# Patient Record
Sex: Male | Born: 1940
Health system: Southern US, Community
[De-identification: ages and names within clinical notes are randomized; demographics above are authoritative.]

## PROBLEM LIST (undated history)

## (undated) DIAGNOSIS — I517 Cardiomegaly: Secondary | ICD-10-CM

## (undated) DIAGNOSIS — M1990 Unspecified osteoarthritis, unspecified site: Secondary | ICD-10-CM

## (undated) DIAGNOSIS — R600 Localized edema: Secondary | ICD-10-CM

## (undated) DIAGNOSIS — E039 Hypothyroidism, unspecified: Secondary | ICD-10-CM

## (undated) DIAGNOSIS — I428 Other cardiomyopathies: Secondary | ICD-10-CM

## (undated) DIAGNOSIS — R238 Other skin changes: Secondary | ICD-10-CM

## (undated) DIAGNOSIS — Z8719 Personal history of other diseases of the digestive system: Secondary | ICD-10-CM

## (undated) DIAGNOSIS — S83242A Other tear of medial meniscus, current injury, left knee, initial encounter: Secondary | ICD-10-CM

## (undated) DIAGNOSIS — I499 Cardiac arrhythmia, unspecified: Secondary | ICD-10-CM

## (undated) DIAGNOSIS — R609 Edema, unspecified: Secondary | ICD-10-CM

## (undated) DIAGNOSIS — E7800 Pure hypercholesterolemia, unspecified: Secondary | ICD-10-CM

## (undated) DIAGNOSIS — N183 Chronic kidney disease, stage 3 unspecified: Secondary | ICD-10-CM

## (undated) DIAGNOSIS — K219 Gastro-esophageal reflux disease without esophagitis: Secondary | ICD-10-CM

## (undated) DIAGNOSIS — R233 Spontaneous ecchymoses: Secondary | ICD-10-CM

## (undated) DIAGNOSIS — R251 Tremor, unspecified: Secondary | ICD-10-CM

## (undated) DIAGNOSIS — I10 Essential (primary) hypertension: Secondary | ICD-10-CM

## (undated) DIAGNOSIS — I340 Nonrheumatic mitral (valve) insufficiency: Secondary | ICD-10-CM

## (undated) DIAGNOSIS — R739 Hyperglycemia, unspecified: Secondary | ICD-10-CM

## (undated) DIAGNOSIS — I509 Heart failure, unspecified: Secondary | ICD-10-CM

## (undated) DIAGNOSIS — I2510 Atherosclerotic heart disease of native coronary artery without angina pectoris: Secondary | ICD-10-CM

## (undated) DIAGNOSIS — I480 Paroxysmal atrial fibrillation: Secondary | ICD-10-CM

## (undated) DIAGNOSIS — G4730 Sleep apnea, unspecified: Secondary | ICD-10-CM

## (undated) HISTORY — PX: OTHER SURGICAL HISTORY: SHX169

## (undated) HISTORY — DX: Cardiomegaly: I51.7

## (undated) HISTORY — DX: Heart failure, unspecified: I50.9

## (undated) HISTORY — DX: Edema, unspecified: R60.9

## (undated) HISTORY — DX: Tremor, unspecified: R25.1

## (undated) HISTORY — DX: Paroxysmal atrial fibrillation: I48.0

## (undated) HISTORY — PX: LITHOTRIPSY: SUR834

## (undated) HISTORY — DX: Hyperglycemia, unspecified: R73.9

## (undated) HISTORY — PX: ATRIAL FIBRILLATION ABLATION: SHX5732

## (undated) HISTORY — DX: Chronic kidney disease, stage 3 unspecified: N18.30

## (undated) HISTORY — DX: Pure hypercholesterolemia, unspecified: E78.00

## (undated) HISTORY — PX: CATARACT EXTRACTION, BILATERAL: SHX1313

## (undated) HISTORY — DX: Chronic kidney disease, stage 3 (moderate): N18.3

## (undated) HISTORY — PX: EYE SURGERY: SHX253

## (undated) HISTORY — PX: CARDIAC CATHETERIZATION: SHX172

## (undated) HISTORY — DX: Nonrheumatic mitral (valve) insufficiency: I34.0

## (undated) HISTORY — DX: Localized edema: R60.0

## (undated) HISTORY — DX: Other cardiomyopathies: I42.8

## (undated) HISTORY — DX: Hypothyroidism, unspecified: E03.9

## (undated) HISTORY — DX: Essential (primary) hypertension: I10

## (undated) HISTORY — PX: KIDNEY STONE SURGERY: SHX686

---

## 1997-06-26 HISTORY — PX: OTHER SURGICAL HISTORY: SHX169

## 1998-04-09 ENCOUNTER — Observation Stay (HOSPITAL_COMMUNITY): Admission: RE | Admit: 1998-04-09 | Discharge: 1998-04-10 | Payer: Self-pay | Admitting: Orthopedic Surgery

## 1998-04-12 ENCOUNTER — Encounter (HOSPITAL_COMMUNITY): Admission: RE | Admit: 1998-04-12 | Discharge: 1998-07-11 | Payer: Self-pay | Admitting: Orthopedic Surgery

## 2003-06-27 HISTORY — PX: JOINT REPLACEMENT: SHX530

## 2003-07-14 ENCOUNTER — Inpatient Hospital Stay (HOSPITAL_COMMUNITY): Admission: RE | Admit: 2003-07-14 | Discharge: 2003-07-18 | Payer: Self-pay | Admitting: Orthopedic Surgery

## 2003-08-06 ENCOUNTER — Encounter: Admission: RE | Admit: 2003-08-06 | Discharge: 2003-08-06 | Payer: Self-pay | Admitting: Orthopedic Surgery

## 2005-03-14 ENCOUNTER — Ambulatory Visit (HOSPITAL_COMMUNITY): Admission: RE | Admit: 2005-03-14 | Discharge: 2005-03-14 | Payer: Self-pay | Admitting: Urology

## 2005-03-20 ENCOUNTER — Ambulatory Visit (HOSPITAL_COMMUNITY): Admission: RE | Admit: 2005-03-20 | Discharge: 2005-03-20 | Payer: Self-pay | Admitting: Urology

## 2005-03-26 HISTORY — PX: HERNIA REPAIR: SHX51

## 2005-04-18 ENCOUNTER — Encounter (INDEPENDENT_AMBULATORY_CARE_PROVIDER_SITE_OTHER): Payer: Self-pay | Admitting: *Deleted

## 2005-04-18 ENCOUNTER — Ambulatory Visit (HOSPITAL_COMMUNITY): Admission: RE | Admit: 2005-04-18 | Discharge: 2005-04-19 | Payer: Self-pay | Admitting: Surgery

## 2005-06-16 ENCOUNTER — Ambulatory Visit (HOSPITAL_COMMUNITY): Admission: RE | Admit: 2005-06-16 | Discharge: 2005-06-16 | Payer: Self-pay | Admitting: Surgery

## 2005-06-26 HISTORY — PX: OTHER SURGICAL HISTORY: SHX169

## 2006-06-13 ENCOUNTER — Encounter: Admission: RE | Admit: 2006-06-13 | Discharge: 2006-06-13 | Payer: Self-pay | Admitting: Orthopedic Surgery

## 2006-06-15 ENCOUNTER — Ambulatory Visit: Payer: Self-pay | Admitting: Infectious Diseases

## 2006-06-15 ENCOUNTER — Ambulatory Visit (HOSPITAL_COMMUNITY): Admission: RE | Admit: 2006-06-15 | Discharge: 2006-06-17 | Payer: Self-pay | Admitting: Orthopedic Surgery

## 2006-07-18 ENCOUNTER — Ambulatory Visit: Payer: Self-pay | Admitting: Infectious Diseases

## 2006-07-18 LAB — CONVERTED CEMR LAB
CRP: 0.2 mg/dL (ref <0.6)
HCT: 41.6 % (ref 39.0–52.0)
Hemoglobin: 13.5 g/dL (ref 13.0–17.0)
MCHC: 32.5 g/dL (ref 30.0–36.0)
MCV: 90 fL (ref 78.0–100.0)
Platelets: 176 10*3/uL (ref 150–400)
RBC: 4.62 M/uL (ref 4.22–5.81)
RDW: 13.6 % (ref 11.5–14.0)
Sed Rate: 12 mm/hr (ref 0–16)
WBC: 7.4 10*3/uL (ref 4.0–10.5)

## 2006-08-16 ENCOUNTER — Encounter: Payer: Self-pay | Admitting: Infectious Diseases

## 2006-10-11 ENCOUNTER — Encounter: Payer: Self-pay | Admitting: Infectious Diseases

## 2006-11-07 ENCOUNTER — Ambulatory Visit: Payer: Self-pay | Admitting: Infectious Diseases

## 2006-11-07 DIAGNOSIS — IMO0002 Reserved for concepts with insufficient information to code with codable children: Secondary | ICD-10-CM | POA: Insufficient documentation

## 2006-11-07 DIAGNOSIS — M1990 Unspecified osteoarthritis, unspecified site: Secondary | ICD-10-CM | POA: Insufficient documentation

## 2006-11-07 DIAGNOSIS — I10 Essential (primary) hypertension: Secondary | ICD-10-CM | POA: Insufficient documentation

## 2006-11-07 DIAGNOSIS — Z96649 Presence of unspecified artificial hip joint: Secondary | ICD-10-CM | POA: Insufficient documentation

## 2006-11-07 DIAGNOSIS — K219 Gastro-esophageal reflux disease without esophagitis: Secondary | ICD-10-CM | POA: Insufficient documentation

## 2006-11-07 DIAGNOSIS — K439 Ventral hernia without obstruction or gangrene: Secondary | ICD-10-CM | POA: Insufficient documentation

## 2006-11-07 DIAGNOSIS — Z87442 Personal history of urinary calculi: Secondary | ICD-10-CM | POA: Insufficient documentation

## 2007-04-29 ENCOUNTER — Encounter: Payer: Self-pay | Admitting: Infectious Diseases

## 2010-04-25 ENCOUNTER — Ambulatory Visit: Payer: Self-pay | Admitting: Cardiology

## 2010-08-01 ENCOUNTER — Ambulatory Visit
Admission: RE | Admit: 2010-08-01 | Discharge: 2010-08-01 | Disposition: A | Discharge status: Home / Self Care | Payer: Medicare Other | Source: Ambulatory Visit | Attending: Cardiology | Admitting: Cardiology

## 2010-08-01 ENCOUNTER — Other Ambulatory Visit: Payer: Self-pay | Admitting: Cardiology

## 2010-08-01 ENCOUNTER — Ambulatory Visit (INDEPENDENT_AMBULATORY_CARE_PROVIDER_SITE_OTHER): Payer: Medicare Other | Admitting: Cardiology

## 2010-08-01 DIAGNOSIS — I4891 Unspecified atrial fibrillation: Secondary | ICD-10-CM

## 2010-08-01 DIAGNOSIS — R609 Edema, unspecified: Secondary | ICD-10-CM

## 2010-08-09 ENCOUNTER — Ambulatory Visit (INDEPENDENT_AMBULATORY_CARE_PROVIDER_SITE_OTHER): Payer: Medicare Other | Admitting: Cardiology

## 2010-08-09 DIAGNOSIS — I4891 Unspecified atrial fibrillation: Secondary | ICD-10-CM

## 2010-08-09 DIAGNOSIS — R0602 Shortness of breath: Secondary | ICD-10-CM

## 2010-08-15 ENCOUNTER — Ambulatory Visit (HOSPITAL_COMMUNITY): Payer: Medicare Other | Attending: Cardiology

## 2010-08-15 ENCOUNTER — Encounter: Payer: Self-pay | Admitting: Infectious Diseases

## 2010-08-15 ENCOUNTER — Encounter: Payer: Self-pay | Admitting: Cardiology

## 2010-08-15 DIAGNOSIS — I998 Other disorder of circulatory system: Secondary | ICD-10-CM | POA: Insufficient documentation

## 2010-08-15 DIAGNOSIS — I4891 Unspecified atrial fibrillation: Secondary | ICD-10-CM

## 2010-08-15 DIAGNOSIS — I059 Rheumatic mitral valve disease, unspecified: Secondary | ICD-10-CM | POA: Insufficient documentation

## 2010-08-17 ENCOUNTER — Ambulatory Visit (INDEPENDENT_AMBULATORY_CARE_PROVIDER_SITE_OTHER): Payer: Medicare Other | Admitting: Cardiology

## 2010-08-17 DIAGNOSIS — M7989 Other specified soft tissue disorders: Secondary | ICD-10-CM

## 2010-08-17 DIAGNOSIS — I4891 Unspecified atrial fibrillation: Secondary | ICD-10-CM

## 2010-08-23 ENCOUNTER — Ambulatory Visit: Payer: Medicare Other | Admitting: Cardiology

## 2010-08-26 ENCOUNTER — Ambulatory Visit (INDEPENDENT_AMBULATORY_CARE_PROVIDER_SITE_OTHER): Payer: Medicare Other | Admitting: Cardiology

## 2010-08-26 DIAGNOSIS — I4891 Unspecified atrial fibrillation: Secondary | ICD-10-CM

## 2010-08-26 DIAGNOSIS — Z79899 Other long term (current) drug therapy: Secondary | ICD-10-CM

## 2010-08-26 DIAGNOSIS — R609 Edema, unspecified: Secondary | ICD-10-CM

## 2010-08-30 ENCOUNTER — Ambulatory Visit (HOSPITAL_COMMUNITY)
Admission: RE | Admit: 2010-08-30 | Discharge: 2010-08-30 | Disposition: A | Discharge status: Home / Self Care | Payer: Medicare Other | Source: Ambulatory Visit | Attending: Cardiology | Admitting: Cardiology

## 2010-08-30 DIAGNOSIS — I509 Heart failure, unspecified: Secondary | ICD-10-CM | POA: Insufficient documentation

## 2010-08-30 DIAGNOSIS — E785 Hyperlipidemia, unspecified: Secondary | ICD-10-CM | POA: Insufficient documentation

## 2010-08-30 DIAGNOSIS — I4891 Unspecified atrial fibrillation: Secondary | ICD-10-CM | POA: Insufficient documentation

## 2010-08-30 DIAGNOSIS — I10 Essential (primary) hypertension: Secondary | ICD-10-CM | POA: Insufficient documentation

## 2010-08-30 LAB — PROTIME-INR: Prothrombin Time: 25 seconds — ABNORMAL HIGH (ref 11.6–15.2)

## 2010-09-02 NOTE — Op Note (Signed)
  NAME:  Charles [name], Charles [name] NO.:  [id number]  MEDICAL RECORD NO.:  [id number]           PATIENT TYPE:  O  LOCATION:  [facility]                         FACILITY:  [facility]  PHYSICIAN:  [name], M.D. DATE OF BIRTH:  December 29, 1940  DATE OF PROCEDURE:  08/30/2010 DATE OF DISCHARGE:  08/30/2010                              OPERATIVE REPORT   PROCEDURE:  Direct current cardioversion.  This 70 year old gentleman is brought into [facility] short- stay for elective cardioversion.  He has been anticoagulated with Pradaxa for more than 3 weeks.  After being given 100 mg of propofol IV by Dr. Jacklynn Bue, the patient was given a single 120 joules shock using the AP pads.  He converted promptly to normal sinus rhythm with sinus bradycardia.  He tolerated the procedure well.  There were no immediate postanesthetic complications.  IMPRESSION:  Successful direct current cardioversion of atrial fibrillation.      ______________________________ [name], M.D.     TB/MEDQ  D:  08/30/2010  T:  08/31/2010  Job:  [id number]  Electronically Signed by [name] M.D. on 09/01/2010 04:18:57 PM

## 2010-09-06 ENCOUNTER — Ambulatory Visit (INDEPENDENT_AMBULATORY_CARE_PROVIDER_SITE_OTHER): Payer: Medicare Other | Admitting: Cardiology

## 2010-09-06 DIAGNOSIS — I4891 Unspecified atrial fibrillation: Secondary | ICD-10-CM

## 2010-09-06 DIAGNOSIS — R0602 Shortness of breath: Secondary | ICD-10-CM

## 2010-10-05 ENCOUNTER — Other Ambulatory Visit: Payer: Self-pay | Admitting: *Deleted

## 2010-10-05 DIAGNOSIS — E7800 Pure hypercholesterolemia, unspecified: Secondary | ICD-10-CM

## 2010-10-06 ENCOUNTER — Encounter: Payer: Self-pay | Admitting: Cardiology

## 2010-10-06 DIAGNOSIS — I517 Cardiomegaly: Secondary | ICD-10-CM | POA: Insufficient documentation

## 2010-10-06 DIAGNOSIS — R0600 Dyspnea, unspecified: Secondary | ICD-10-CM | POA: Insufficient documentation

## 2010-10-06 DIAGNOSIS — N189 Chronic kidney disease, unspecified: Secondary | ICD-10-CM | POA: Insufficient documentation

## 2010-10-06 DIAGNOSIS — I509 Heart failure, unspecified: Secondary | ICD-10-CM | POA: Insufficient documentation

## 2010-10-06 DIAGNOSIS — E7800 Pure hypercholesterolemia, unspecified: Secondary | ICD-10-CM | POA: Insufficient documentation

## 2010-10-06 DIAGNOSIS — R609 Edema, unspecified: Secondary | ICD-10-CM | POA: Insufficient documentation

## 2010-10-06 DIAGNOSIS — R0601 Orthopnea: Secondary | ICD-10-CM | POA: Insufficient documentation

## 2010-10-06 DIAGNOSIS — I340 Nonrheumatic mitral (valve) insufficiency: Secondary | ICD-10-CM | POA: Insufficient documentation

## 2010-10-06 DIAGNOSIS — I4891 Unspecified atrial fibrillation: Secondary | ICD-10-CM | POA: Insufficient documentation

## 2010-10-06 DIAGNOSIS — I10 Essential (primary) hypertension: Secondary | ICD-10-CM | POA: Insufficient documentation

## 2010-10-06 DIAGNOSIS — R600 Localized edema: Secondary | ICD-10-CM | POA: Insufficient documentation

## 2010-10-07 ENCOUNTER — Other Ambulatory Visit (INDEPENDENT_AMBULATORY_CARE_PROVIDER_SITE_OTHER): Payer: Medicare Other | Admitting: *Deleted

## 2010-10-07 ENCOUNTER — Encounter: Payer: Self-pay | Admitting: Cardiology

## 2010-10-07 ENCOUNTER — Ambulatory Visit (INDEPENDENT_AMBULATORY_CARE_PROVIDER_SITE_OTHER): Payer: Medicare Other | Admitting: Cardiology

## 2010-10-07 VITALS — BP 122/70 | HR 56 | Ht 71.0 in | Wt 193.0 lb

## 2010-10-07 DIAGNOSIS — Z79899 Other long term (current) drug therapy: Secondary | ICD-10-CM

## 2010-10-07 DIAGNOSIS — E7800 Pure hypercholesterolemia, unspecified: Secondary | ICD-10-CM

## 2010-10-07 DIAGNOSIS — I4891 Unspecified atrial fibrillation: Secondary | ICD-10-CM

## 2010-10-07 LAB — CBC WITH DIFFERENTIAL/PLATELET
Basophils Absolute: 0 10*3/uL (ref 0.0–0.1)
Eosinophils Relative: 4.3 % (ref 0.0–5.0)
HCT: 42.8 % (ref 39.0–52.0)
Hemoglobin: 14.7 g/dL (ref 13.0–17.0)
Lymphs Abs: 1.7 10*3/uL (ref 0.7–4.0)
MCV: 92.4 fl (ref 78.0–100.0)
Monocytes Relative: 10.4 % (ref 3.0–12.0)
Neutro Abs: 4 10*3/uL (ref 1.4–7.7)
RDW: 14.6 % (ref 11.5–14.6)

## 2010-10-07 LAB — HEPATIC FUNCTION PANEL
Albumin: 4.2 g/dL (ref 3.5–5.2)
Total Protein: 7.2 g/dL (ref 6.0–8.3)

## 2010-10-07 LAB — BASIC METABOLIC PANEL
CO2: 26 mEq/L (ref 19–32)
Calcium: 9.4 mg/dL (ref 8.4–10.5)
Creatinine, Ser: 1.2 mg/dL (ref 0.4–1.5)
Glucose, Bld: 95 mg/dL (ref 70–99)

## 2010-10-07 MED ORDER — AMIODARONE HCL 200 MG PO TABS: 200.0000 mg | ORAL_TABLET | Freq: Every day | ORAL | Status: DC

## 2010-10-07 MED ORDER — ASPIRIN 325 MG PO TABS: 325.0000 mg | ORAL_TABLET | Freq: Every day | ORAL | Status: AC

## 2010-10-07 NOTE — Assessment & Plan Note (Signed)
The patient is seen for one-month followup office visit.  He has had prior successful cardioversion from atrial fibrillation on 08/30/10.  He has continued on Pradaxa and has remained in normal sinus rhythm he has also been onAmiodarone 200 mg twice a day.  He is not having any symptoms of congestive heart failure now and he denies dyspnea orthopnea or peripheral edema.  He feels like he is back to normal.

## 2010-10-07 NOTE — Progress Notes (Signed)
Doretha Imus Date of Birth:  January 31, 1941 [facility] Cardiology / [facility] [address]   [address] [place], [place]  [address] [phone]           Fax   [phone] Doretha Imus Date of Birth:  01-Jun-1941 [facility] Cardiology / [facility] [address]   [address] [place], [place]  [address] [phone]           Fax   [phone]  History of Present Illness: This is a 70 year old gentleman who returns for a followup visit.  He had a remote history of paroxysmal atrial fibrillation.  In early February he returned from [place] with a history that he had been gaining weight for several weeks and had been short of breath.  While in [place] he had gained about 16 pounds.  We treated him initially with diltiazem and furosemide and Pradaxa and subsequently amiodarone and Zaroxolyn and the patient improved to the point that we were able to cardiovert him on August 30, 2010.  He has not had an ischemic workup.  He had an echocardiogram on 08/15/10 which showed ejection fraction of a mild to moderately depressed with normal cavity size and normal wall thickness and mild mitral regurgitation.  Current Outpatient Prescriptions  Medication Sig Dispense Refill  . amiodarone (PACERONE) 200 MG tablet Take 1 tablet (200 mg total) by mouth daily.  30 tablet  12  . Ascorbic Acid (VITAMIN C) 500 MG tablet Take 1,500 mg by mouth daily.        . Cholecalciferol (VITAMIN D) 2000 UNITS CAPS Take 4,000 Units by mouth daily.        . Coenzyme Q10 (COQ10 PO) Take 100 mg by mouth daily.       . enalapril (VASOTEC) 20 MG tablet Take 20 mg by mouth daily.        . furosemide (LASIX) 40 MG tablet Take 20 mg by mouth daily. As directed      . magnesium oxide (MAG-OX) 400 MG tablet Take 400 mg by mouth daily.        . metoprolol (TOPROL-XL) 100 MG 24 hr tablet Take 50 mg by mouth 2 (two) times daily.        . Misc Natural Products (OSTEO BI-FLEX JOINT SHIELD PO) Take by mouth daily.        .  Multiple Vitamin (MULTIVITAMIN) tablet Take 1 tablet by mouth daily.        . Omega-3 Fatty Acids (FISH OIL) 1000 MG CAPS Take by mouth 2 (two) times daily.        Marland Kitchen omeprazole (PRILOSEC) 20 MG capsule Take 20 mg by mouth daily.        Marland Kitchen zinc gluconate 50 MG tablet Take 50 mg by mouth daily.        Marland Kitchen DISCONTD: amiodarone (PACERONE) 200 MG tablet Take 200 mg by mouth 2 (two) times daily.        Marland Kitchen DISCONTD: aspirin 81 MG tablet Take 81 mg by mouth daily.        Marland Kitchen DISCONTD: dabigatran (PRADAXA) 150 MG CAPS Take 150 mg by mouth every 12 (twelve) hours.        Marland Kitchen DISCONTD: Potassium Chloride Crys CR (KLOR-CON M20 PO) Take by mouth. 1/2 tablet two times daily      . aspirin (BAYER ASPIRIN) 325 MG tablet Take 1 tablet (325 mg total) by mouth daily.  100 tablet  3    Allergies  Allergen Reactions  .  Ceftriaxone Sodium     Rocephin  . Diltiazem Hcl     Cardizem  . Penicillins     Patient Active Problem List  Diagnoses  . HYPERTENSION  . GERD  . ABDOMINAL WALL HERNIA  . OSTEOARTHROSIS NOS, UNSPECIFIED SITE  . ABRASION, LOWER EXTREMITY W/INFECTION  . NEPHROLITHIASIS, HX OF  . HIP REPLACEMENT, TOTAL, HX OF  . Atrial fibrillation  . Peripheral edema  . Swelling  . Mitral regurgitation  . Atrial dilatation  . Orthopnea  . Dyspnea  . CHF (congestive heart failure)  . Hypertension  . Hypercholesterolemia  . Prerenal azotemia    History  Smoking status  . Never Smoker   Smokeless tobacco  . Never Used    History  Alcohol Use No    Family History  Problem Relation Age of Onset  . Cancer Mother 50    CANCER  . Heart disease Father 35    HEART PROBLEMS    Review of Systems: Constitutional: no fever chills diaphoresis or fatigue or change in weight.  Head and neck: no hearing loss, no epistaxis, no photophobia or visual disturbance. Respiratory: No cough, shortness of breath or wheezing. Cardiovascular: No chest pain peripheral edema, palpitations. Gastrointestinal: No  abdominal distention, no abdominal pain, no change in bowel habits hematochezia or melena. Genitourinary: No dysuria, no frequency, no urgency, no nocturia. Musculoskeletal:No arthralgias, no back pain, no gait disturbance or myalgias. Neurological: No dizziness, no headaches, no numbness, no seizures, no syncope, no weakness, no tremors. Hematologic: No lymphadenopathy, no easy bruising. Psychiatric: No confusion, no hallucinations, no sleep disturbance.    Physical Exam: Filed Vitals:   10/07/10 0915  BP: 122/70  Pulse: 56  The general appearance feels a well-developed well-nourished gentleman in no distress.Pupils equal and reactive.   Extraocular Movements are full.  There is no scleral icterus.  The mouth and pharynx are normal.  The neck is supple.  The carotids reveal no bruits.  The jugular venous pressure is normal.  The thyroid is not enlarged.  There is no lymphadenopathy.The chest is clear to percussion and auscultation. There are no rales or rhonchi. Expansion of the chest is symmetrical.The precordium is quiet.  The first heart sound is normal.  The second heart sound is physiologically split.  There is no murmur gallop rub or click.  There is no abnormal lift or heave.The abdomen is soft and nontender. Bowel sounds are normal. The liver and spleen are not enlarged. There Are no abdominal masses. There are no bruits.The pedal pulses are good.  There is no phlebitis or edema.  There is no cyanosis or clubbing.Strength is normal and symmetrical in all extremities.  There is no lateralizing weakness.  There are no sensory deficits.   Assessment / Plan: EKG shows that he is remaining in normal sinus rhythm.  At this point we are stopping his Pradaxa and switch him back to a full 325 aspirin daily.  We are also reducing his amiodarone to 200 mg once a day.  He checks his pulse each day and he will know if he goes back into atrial fibrillation.  We will plan to see him again in several  months for followup office visit.  He may increase activity as tolerated

## 2010-10-13 ENCOUNTER — Encounter: Payer: Self-pay | Admitting: *Deleted

## 2010-11-11 NOTE — Op Note (Signed)
NAME:  Charles [name], VANTINE NO.:  [id number]   MEDICAL RECORD NO.:  [id number]          PATIENT TYPE:  AMB   LOCATION:  DAY                          FACILITY:  [facility]   PHYSICIAN:  Georges Lynch. Gioffre, M.D.DATE OF BIRTH:  [birth year]   DATE OF PROCEDURE:  06/15/2006  DATE OF DISCHARGE:                               OPERATIVE REPORT   SURGEON:  Dr. Darrelyn Hillock   ASSISTANT:  Eugenia Pancoast, PA student and a nurse.   PREOPERATIVE DIAGNOSIS:  Rule out infection right total hip secondary to  an infected laceration of his leg on the same right side.   POSTOPERATIVE DIAGNOSIS:  Rule out infection right total hip secondary  to an infected laceration of his leg on the same right side.   OPERATION:  Incision and drainage of the right hip.   No antibiotics were given.  He was previously on Keflex prior to coming  in the hospital.  He had previous aspirations of his hip and his bursa.  The hip was aspirated by radiology 2 days ago.  I aspirated his bursa  yesterday.  No positive cultures came back, but he had a high C-reactive  protein of 190; white count was 14,400; sed rate was 21.  The one Gram  stain from the hip came back gram-positive rods.   PROCEDURE:  Under general anesthesia, a routine orthopedic prep and  draping e of the right hip was carried out.  I made an incision over the  greater trochanteric bursa.  Great care was taken not to injure the  underlying sciatic nerve.  I debrided the hip superficially and then  immediately upon making an incision deep to the iliotibial band, a large  amount of serosanguineous fluid came out of the wound.  There was no  gross pus.  I took immediate cultures.  This was the third culture and  sensitivity of his wound and a STAT Gram stain.  I thoroughly irrigated  the hip out with 2 large bags of saline followed by irrigation with  triple antibiotic solution.  After thoroughly irrigating out the wound  and debriding the wound, prior to  finishing irrigation, went down to the  hip joint per se, and it appeared to be very clean.  I then inserted a  large Hemovac drain, loosely approximated the wound, and sterile  dressings were applied.  At the end of the procedure, I gave him 1 g of  vancomycin.  I did call cardiology to see him preop because he had a  stress-related atrial fibrillation which dissipated within 1/2 hour  prior to cardiologist arriving.  We gave the patient  Lopressor 5 mg IV before taking him back to surgery.  During his  surgical procedure, we had no cardiac issues.  So basically, he will be  seen by infectious disease.  I called Dr. Darlina Sicilian.  He will have Dr.  Enedina Finner see him, start him on the appropriate antibiotics.           ______________________________  Georges Lynch Darrelyn Hillock, M.D.     RAG/MEDQ  D:  06/15/2006  T:  06/15/2006  Job:  [id number]   cc:   [name], M.D.  Fax: [phone]

## 2010-11-11 NOTE — Op Note (Signed)
NAME:  Charles [name], Charles [name] NO.:  [id number]   MEDICAL RECORD NO.:  [id number]          PATIENT TYPE:  OIB   LOCATION:  1621                         FACILITY:  [facility]   PHYSICIAN:  Sandria Bales. Ezzard Standing, M.D.  DATE OF BIRTH:  06-02-1941   DATE OF PROCEDURE:  04/18/2005  DATE OF DISCHARGE:                                 OPERATIVE REPORT   PREOPERATIVE DIAGNOSES:  1.  Epigastric hernia.  2.  Gastroesophageal reflux disease.   POSTOPERATIVE DIAGNOSES:  Epigastric hernia, approximately 2 cm fascial  defect.  A 4 cm hiatal hernia with small hyperplastic-appearing gastric  polyps in the antrum of the stomach and possible early Barrett's at the  esophagogastric junction.   PROCEDURE:  Lap-assisted ventral hernia repair with Ventralex mesh (Bard),  and esophagogastroduodenoscopies with biopsy of gastric polyps and biopsy at  the esophagogastric junction.   SURGEON:  Dr. Ezzard Standing   FIRST ASSISTANT:  Darcel Bayley, PA student   ANESTHESIA:  General endotracheal with 20 mL of 0.25% Marcaine.   COMPLICATIONS:  None.   INDICATIONS FOR PROCEDURE:  Charles [name] is a 70 year old white male, who has  had a mass on his abdominal wall that he thought was a lipoma, but it has  gotten suddenly larger and caused some discomfort and appears to be a  ventral hernia.  He has had no prior abdominal surgery.  He also, by CT, was  noted to have a hiatal hernia.  He has had reflux which has responded well  to medication.  We will plan an upper endoscopy at the same time as this  procedure.   The indications and potential complications were explained to the patient.  Potential complications include but not limited to bleeding, infection,  recurrence of the hernia, and the possibility of doing this laparoscopic or  open.   OPERATION:  The patient is in a supine position, had a Foley catheter in  place.  His abdomen was shaved and prepped with Betadine solution and  sterilely draped.  He was  given 1 g of Ancef at the initiation of the  procedure.  I placed an Ioban drape over the prep to cover his abdomen.  I  then accessed his abdominal cavity through the left upper quadrant, making  an incision and getting into the abdominal cavity using an OptiView Ethicon  trocar.   I then placed a 0-degree 10 mm scope through the 10-11 trocar.  In abdominal  exploration, one question was whether he has inguinal hernias, but I saw no  evidence that he has inguinal hernias laparoscopically.  His bowel was  otherwise unremarkable.  His anterior wall of his stomach was unremarkable.  Both lobes of his liver was unremarkable but what he did have was a large  ball of omentum caught in this ventral hernia.   I reduced the omentum out of the hernia but when I got the omentum reduced,  the defect in the fascia was about 2 cm.  I thought that instead of trying  to put mesh intra-abdominally, that he would be an appropriate candidate for  doing this extraperitoneally  through an anterior approach.   So I then made an incision directly over the hernia defect and went down to  the hernia.  I localized the sac, was able to invert the sac.  I was able to  free up a peritoneal space under the fascial defect and used the Bard  Ventralex that was 2.5 inches in size as a patch under the hernia defect.  I  sewed this in place with four #1 PDS sutures.  The defect I then closed with  interrupted 0 Novofil sutures, so this kind of made a double layer closure  effect.  I then re-laparoscoped the patient and you could see  the old sac  hanging into the abdominal cavity.  I reinspected the omentum which had been  bleeding some.  I controlled the bleeding with Bovie electrocautery, but  there was really no evidence of any additional bleeding and then inspected  the port sites.   The 5 mm left lower quadrant port and the 10 mm, 11 mm left upper quadrant  port were both removed.  The skin in the midline was  closed with 3-0 Vicryl  sutures and a 5-0 Monocryl suture.  The other 2 sites were closed with 5-0  Monocryl suture.  I injected about 20 mL of 0.25% Marcaine in all the sites.   I then turned my attention to doing the upper endoscopy.  I passed an  Olympus endoscope down into his stomach without difficulty.  I cannulated  the second portion of his duodenum.  His first and second portion of his  duodenum were unremarkable.  The pylorus was unremarkable.  In his antrum,  he probably had 15-20 what appeared to be tiny hyperplastic polyps.  These  polyps were probably no more than anywhere from 1 to about 4 mm in size.  I  did biopsy 3 for pathology.  I also did a CLOtest for H. pylori which is  pending at the time of this dictation.   I then brought the scope back.  He does have what appears to be an  approximately 4 cm hiatal hernia.  His diaphragm appears to be at 40 cm.  His esophagogastric junction appears to be between 36-37 cm, and he does  appear to have a tongue of tissue which I think could represent Barrett's  esophagus.  I tried to biopsy this area with 3 biopsies, and these biopsies  were done at about 37-38 cm range.   Otherwise, his esophagus was unremarkable.   The scope was then withdrawn and the patient transferred to the recovery  room in good condition.  I would suggest probably re-endoscoping the patient  in 2-3 years, again, depending on his symptoms and how well he is doing with  his proton pump inhibitors.      Sandria Bales. Ezzard Standing, M.D.  Electronically Signed     DHN/MEDQ  D:  04/18/2005  T:  04/18/2005  Job:  [id number]   cc:   Charles [name]  Fax: [phone]   Charles [name], M.D.  Fax: [phone]

## 2010-11-11 NOTE — Op Note (Signed)
NAME:  Charles [name], Charles [name] NO.:  [id number]   MEDICAL RECORD NO.:  [id number]          PATIENT TYPE:  AMB   LOCATION:  DAY                          FACILITY:  [facility]   PHYSICIAN:  Boston Service, M.D.DATE OF BIRTH:  01-Apr-1941   DATE OF PROCEDURE:  03/14/2005  DATE OF DISCHARGE:                                 OPERATIVE REPORT   PREOPERATIVE DIAGNOSIS:  A 69 year old male 12 mm and 15 mm stones within  the left kidney, two very small stones on the left. The patient status post  ESWL 1987, now scheduled for repeat ESWL. Given the size of the stones, 12  and 15 mm, thought prudent to place double-J stent prior to procedure.   POSTOPERATIVE DIAGNOSES:  Same.   PROCEDURE:  Cystoscopy, retrograde double-J stent.   ANESTHESIA:  General.   SURGEON:  Boston Service, M.D.   DRAINS:  6-French 26 cm double-J stent.   ESTIMATED BLOOD LOSS:  Minimal.   COMPLICATIONS:  The patient demonstrated to have stenotic infundibula to the  upper pole calyces, could be negotiated with the tip of the Glidewire but  unable to place double-J stent into the upper pole calyces.   DESCRIPTION OF PROCEDURE:  The patient was prepped and draped in the dorsal  lithotomy position after institution of an adequate level of general  anesthesia (LMA). A well lubricated 21-French panendoscope was gently  inserted at the urethral meatus, normal urethra and sphincter,  nonobstructive prostate. Bladder shows no evidence of tumor, stone, bleeding  site or other anatomic abnormality.   Retrograde films were then performed with a blocking catheter, injection of  3-6 mL on the right showed normal anatomy of the ureter. An additional 3-6  mL showed normal anatomy of the pelvis and calyces without any obvious  evidence of obstruction or blockage. Prompt drainage of 3-5 minutes. A  similar technique was used on the left side. The ureter showed no evidence  of filling defect or obstruction. The patient  did have a 12-15 mm stone  perched just above the left UPJ and a larger stone probably 15-18 mm within  what appeared to be an upper pole calix. Despite gentle injection of  contrast, it was difficult to get the upper pole calix to fill raising  concern of a stenotic infundibula in this area.   Once retrograde films had been performed, end-hole catheter was placed at  the left ureteral orifice and advanced to the left renal pelvis. Gentle  passage of a Glidewire through the end-hole catheter appeared to transverse  the upper pole calix and encircled the upper pole stone. It was difficult,  however, to advance the end-hole catheter over the Glidewire and the space  within the upper pole calix appeared to be quite narrowed, too narrow in  fact to accept the proximal end of a double-J stent. For that reason, the  Glidewire was withdrawn, end-hole catheter was backed up about 2 or 3 cm, a  guidewire was used, passed into the left renal pelvis, coiled nicely and 6-  [place] 26 cm double-J  stent was passed over the indwelling guidewire  with excellent pigtail  formation within the left renal pelvis and in the bladder. The bladder was  drained, cystoscope was removed. The patient was given a B&O suppository and  returned to recovery in satisfactory condition.           ______________________________  Boston Service, M.D.     RH/MEDQ  D:  03/14/2005  T:  03/14/2005  Job:  [id number]   cc:   [name]  [address]  [place]  [place] [address]  Fax: [phone]

## 2010-11-11 NOTE — H&P (Signed)
NAME:  Charles [name], Charles [name]                    ACCOUNT NO.:  [id number]   MEDICAL RECORD NO.:  [id number]                   PATIENT TYPE:  INP   LOCATION:  NA                                   FACILITY:  [facility]   PHYSICIAN:  Georges Lynch. Darrelyn Hillock, M.D.             DATE OF BIRTH:  07/03/1940   DATE OF ADMISSION:  07/14/2003  DATE OF DISCHARGE:                                HISTORY & PHYSICAL   PREADMISSION HISTORY AND PHYSICAL:   HISTORY:  Patient has had right hip pain for the past 2-3 years.  The pain  has been getting progressively worse over the past 12 months.  He is having  pain in his right groin and increasing pain when he is walking.  His x-ray  revealed degenerative changes and collapse of the joint space and spurring.  The patient has prolonged the surgery as long as possible with nonsteroidal  anti-inflammatory medications but he is no longer getting relief.  Patient  elects to proceed with a right total hip replacement.   DRUG ALLERGIES:  None known.   PRIMARY CARE Jarius Dieudonne:  Dr. Lelon Huh in [place].   PAST MEDICAL HISTORY:  1. Degenerative arthritis.  2. Hypertension.  3. Kidney stones.   CURRENT MEDICATIONS:  1. Atenolol 25 mg daily.  2. Multivitamin daily.   PREVIOUS SURGICAL HISTORY:  Patient had lithotripsy in 1987 for kidney  stones and left quadriceps tendon repair in 1999.   REVIEW OF SYSTEMS:  GENERAL:  Denies weight change, fever, chills, fatigue.  HEENT:  Denies headache, visual changes, tinnitus, hearing loss, sore  throat.  CARDIOVASCULAR:  Denies chest pain, palpitations, shortness of  breath, orthopnea.  PULMONARY:  Denies dyspnea, wheezing, cough, sputum  production, hemoptysis.  GU:  Denies dysuria, frequency, urgency, hematuria.  ENDOCRINE:  Denies polyuria, polydipsia, appetite change, heat or cold  intolerance.  MUSCULOSKELETAL:  Patient has pain in his right hip and right  groin.  NEUROLOGIC:  Denies dizziness, vertigo, syncope, seizures.   SKIN:  Denies itching, rashes, masses, or moles.   PHYSICAL EXAMINATION:  VITAL SIGNS:  Temperature 98.6, pulse 68,  respirations 18, blood pressure 158/90.  GENERAL:  [age]-year-old male in no acute distress.  HEENT:  PERRL.  EOMs intact.  Pharynx clear.  NECK:  Supple without masses.  CHEST:  Clear to auscultation bilaterally.  HEART:  Regular rate and rhythm without murmur.  ABDOMEN:  Positive bowel sounds, soft, nontender; no organomegaly or  abnormal masses.  RIGHT HIP:  Restricted range of motion.  EXTREMITIES:  His right lower extremity is approximately 1/2 inch shorter  than the left.  SKIN:  Warm and dry.   RADIOGRAPH:  X-ray of his right hip reveals decreased joint space, spurring,  and collapse of the joint space.   IMPRESSION:  Degenerative arthritis right hip.   PLAN:  Patient is to be admitted to [facility] July 14, 2003 to  undergo a right total hip arthroplasty.  Ebbie Ridge. Paitsel, P.A.                     Ronald A. Darrelyn Hillock, M.D.    Tilden Dome  D:  07/13/2003  T:  07/13/2003  Job:  [id number]

## 2010-11-11 NOTE — Discharge Summary (Signed)
NAME:  Charles [name], Charles [name] NO.:  [id number]   MEDICAL RECORD NO.:  [id number]                   PATIENT TYPE:  INP   LOCATION:  0463                                 FACILITY:  [facility]   PHYSICIAN:  Georges Lynch. Darrelyn Hillock, M.D.             DATE OF BIRTH:  02/01/1941   DATE OF ADMISSION:  07/14/2003  DATE OF DISCHARGE:  07/18/2003                                 DISCHARGE SUMMARY   ADMISSION DIAGNOSES:  1. Degenerative arthritis, right hip.  2. Hypertension.  3. History of kidney stone.   DISCHARGE DIAGNOSES:  1. Degenerative arthritis, right hip, status post right total hip     arthroplasty.  2. Hypertension.  3. History of kidney stones.   PROCEDURE:  The patient was taken to the operating room on July 14, 2003,  to undergo a right total hip arthroplasty.  Surgeon was Worthy Rancher, M.D.,  assistant was Durene Romans, M.D.  Surgery was performed under general  anesthesia.   HISTORY:  This patient is a 70 year old male.  He has been having right hip  pain for the past two to three years.  The pain has gradually been getting  worse over the past 12 months.  He has been having pain in his right groin  which increases when he walks.  X-ray revealed degenerative arthritis of his  right hip with collapse of the joint space and some spurring.  The patient  has prolonged surgery as long as possible with the use of non-steroidal anti-  inflammatory medications, but he is no longer getting relief, and the  patient elects to proceed with a right total hip arthroplasty.   LABORATORY DATA:  Admission CBC:  WBC 7.3, RBC 5.02, hemoglobin 15.8,  hematocrit 45.2, platelet count 236.  Admission PT 12.8, [name] 0.9, PTT 27.  Admission chemistries:  Sodium 137, potassium 4.1, chloride 108, CO2 28,  glucose 108, BUN 17, creatinine 1.1, calcium 10.6, albumin 4.3.  Urinalysis:  Moderate leukocyte esterase on dip stick.  Microscopic revealed 3 to 6 WBCs  per high powered field.   The patient's blood type is A positive, negative  antibody screen.  Admission EKG showed normal sinus rhythm at a rate of 72.  Preadmission x-ray of right hip revealed advanced degenerative changes.  Postoperative x-ray of right hip revealed good alignment of prosthesis.  I  am unable to locate the preoperative chest x-ray.   HOSPITAL COURSE:  This patient was admitted to [facility] and  taken to the operating room.  He underwent the above stated procedure  without complication.  He returned to the recovery room and then to the  orthopaedic floor to continue his postoperative care.  The patient was  placed on PCA analgesia for pain control.  The patient's pain was well  controlled.  He was weaned over to oral analgesics.  The patient's PCA was  discontinued on postoperative day #2.  The patient  progressed well.  He was  able to ambulate with a walker greater than 100 feet, and it was felt the  patient could be discharged home by postoperative day #4.  The patient's  hemoglobin and hematocrit was also followed throughout his hospitalization.  He had a slight decrease in his hemoglobin postoperatively, but that  stabilized prior to discharge and he did not require blood transfusion.   DISPOSITION:  The patient was discharged home on July 18, 2003.   DISCHARGE MEDICATIONS:  1. Coumadin 5 mg daily.  2. Percocet 10/650 one or two q.6h. p.r.n. pain.  3. Robaxin 500 mg one q.6h. p.r.n. muscle spasm.   ACTIVITY:  Touchdown weightbearing with walker, total hip precautions.   WOUND CARE:  Keep dry and clean.   FOLLOWUP:  The patient is scheduled to follow up with Dr. Darrelyn Hillock two weeks  from the date of surgery.  He should call the office to schedule the  appointment.   CONDITION ON DISCHARGE:  Improved.     Ebbie Ridge. Paitsel, P.A.                     Ronald A. Darrelyn Hillock, M.D.    Tilden Dome  D:  08/19/2003  T:  08/19/2003  Job:  [id number]

## 2010-11-11 NOTE — Op Note (Signed)
NAME:  Charles [name], Charles [name] NO.:  [id number]   MEDICAL RECORD NO.:  [id number]          PATIENT TYPE:  AMB   LOCATION:  ENDO                         FACILITY:  [facility]   PHYSICIAN:  Sandria Bales. Ezzard Standing, M.D.  DATE OF BIRTH:  Jan 02, 1941   DATE OF PROCEDURE:  06/16/2005  DATE OF DISCHARGE:  06/16/2005                                 OPERATIVE REPORT   PREOPERATIVE DIAGNOSIS:  History of gastric polyps. No prior colonoscopy   POSTOPERATIVE DIAGNOSIS:  Pedunculated, lipomatous mass at hepatic flexure.  Mild sigmoid colon diverticulosis. No mucosa lesions.   PROCEDURE:  Flexible colonoscopy.   SURGEON:  Sandria Bales. Ezzard Standing, M.D.   ANESTHESIA:  50 mg of Demerol, 5 mg of Versed.   COMPLICATIONS:  None.   INDICATIONS FOR PROCEDURE:  [name] is a 70 year old white male who I  have taken care of a ventral hernia that I repaired in October. At the same  time, he did an upper endoscopy, and he was found to have some gastric  polyps. Because of the finding of gastric polyps, I thought he would be  served to have a full colonoscopy since he has never had colonoscopy and  make sure there are no colonic polyps.   Indications and potential complications of procedure explained to the  patient. The patient completed a HalfLytely bowel prep at home.   OPERATIVE NOTE:   The patient presented to the endoscopy suite, had an IV placed in his right  arm. He was monitered with pulse oximetry, EKG, and BP cuff.  He was placed  on nasal O2.   He was placed in the left lateral decubitus position and given 50 mg of  Demerol and 5 mg of Versed. A flexible Olympus colonoscope was passed  without difficulty up his rectum into his sigmoid colon around to his cecum.  The ileocecal valve was visualized in the right lower quadrant. The right  colon was unremarkable. At the junction of the right colon and transverse  colon right under the hepatic flexure, he had a pedunculated lipoma which  was maybe  about 3 cm in length, but it had no mucosal change and I think  this all was a benign lipoma and I left this alone other than taking a photo  of it. The remainder of his transverse colon and left colon was  unremarkable. In his sigmoid colon, he had some mild diverticulosis. I saw  maybe 8 or 10 diverticula pulling the scope back. I then retroflexed the  scope in the rectum.   I saw no rectal lesions or masses and withdrew the scope.  At colonoscopy,  he had no obvious mucosal lesion. He did have a pedunculated lipoma in the  hepatic flexure and some mild diverticulosis.      Sandria Bales. Ezzard Standing, M.D.  Electronically Signed     DHN/MEDQ  D:  06/16/2005  T:  06/20/2005  Job:  [id number]   cc:   [name]  Fax: [phone]   [place] Service, M.D.  Fax: [phone]

## 2010-11-11 NOTE — Op Note (Signed)
NAME:  Charles [name], TALLO NO.:  [id number]   MEDICAL RECORD NO.:  [id number]                   PATIENT TYPE:  INP   LOCATION:  X004                                 FACILITY:  [facility]   PHYSICIAN:  Georges Lynch. Darrelyn Hillock, M.D.             DATE OF BIRTH:  Oct 26, 1940   DATE OF PROCEDURE:  07/14/2003  DATE OF DISCHARGE:                                 OPERATIVE REPORT   PREOPERATIVE DIAGNOSIS:  Severe degenerative arthritis of the right hip.   POSTOPERATIVE DIAGNOSIS:  Severe degenerative arthritis of the right hip.   OPERATION:  Right total hip arthroplasty utilizing the [employer] ceramic  total hip.  The sizes used were as follows:  The cup size was a size 60 mm  cup.  It was a Trident cup with two cancellous bone screws for fixation.  The femoral component was a size 11 Secure Fit Plus stem with a 36 mm  diameter ceramic insert.  The head size was a +0, 36 mm diameter ceramic  head.   SURGEON:  Georges Lynch. Darrelyn Hillock, M.D.   ASSISTANT:  Madlyn Frankel. Charlann Boxer, M.D.   DESCRIPTION OF PROCEDURE:  Under general anesthesia, routine orthopedic prep  and draping of the right hip was carried out.  The patient had 1 g of IV  Ancef preop.  At this time a posterolateral approach to the hip was carried  out, bleeders identified and cauterized.  Self-retaining retractors were  inserted.  I incised the iliotibial band in the usual fashion, then  dissected across the gluteal muscle by blunt dissection.  Great care was  taken to protect the sciatic nerve at all times.  At this time I did a  partial detachment of the Zickel band.  I then went down to detach the  external rotators.  I did a capsulectomy.  I dislocated the femoral head and  amputated the femoral head to appropriate neck length.  Note:  Measurements  were taken during the surgery and prior to surgery, for leg lengths.  At  this time the neck was cut at the appropriate length.  We then went on and  inserted our guide pin  and reamed and rasped the femoral shaft up to a size  11 Secure Fit femoral component.  The distal tip also was reamed  appropriately.  After this we then reamed the acetabulum up to a size 58 mm  with a 58 mm reamer for a 60 mm Trident cup.  We inserted our Trident cup,  went through trials, readjusted the cup until we had a good stable position,  and then fixed the cup in with two screws.  We then went through trials once  again with the trial insert, and we selected a +0 C-tapered head, 36 mm in  diameter.  Following this we then made sure that our cup was fixed in well  with two cancellous screws.  We then inserted our  ceramic insert, 32 mm  diameter.  Prior to doing this I did bur the posterior and inferior aspect  of the socket to make sure that the cup would fit in nicely.  At this  particular time we then inserted our permanent Secure Fit size 11 femoral  component.  We went through trials once again for neck length and selected a  +0 36 mm diameter permanent ball.  We then inserted our ceramic C-tapered  head 36 mm in diameter.  We cleared the acetabulum, reduced the hip, took  the hip through all range of motion.  We had excellent stability.  We then  went through leg length measurements again and had excellent lengths though  he was one-half inch shortened on that right side starting out prior to  surgery.  Thoroughly irrigated out the area, reattached the  rotators in the usual fashion, and then closed the remaining part of the  wound in the usual fashion.  Skin was closed with metal staples and a  sterile Neosporin dressing was applied.  The patient left the operating room  in satisfactory condition.                                               Ronald A. Darrelyn Hillock, M.D.    RAG/MEDQ  D:  07/14/2003  T:  07/14/2003  Job:  [id number]

## 2011-01-02 ENCOUNTER — Ambulatory Visit (INDEPENDENT_AMBULATORY_CARE_PROVIDER_SITE_OTHER): Payer: Medicare Other | Admitting: Cardiology

## 2011-01-02 ENCOUNTER — Encounter: Payer: Self-pay | Admitting: Cardiology

## 2011-01-02 VITALS — BP 120/60 | HR 70 | Wt 195.0 lb

## 2011-01-02 DIAGNOSIS — I4891 Unspecified atrial fibrillation: Secondary | ICD-10-CM

## 2011-01-02 DIAGNOSIS — I509 Heart failure, unspecified: Secondary | ICD-10-CM

## 2011-01-02 MED ORDER — AMIODARONE HCL 200 MG PO TABS: 100.0000 mg | ORAL_TABLET | Freq: Every day | ORAL | Status: DC

## 2011-01-02 NOTE — Assessment & Plan Note (Signed)
The patient is a past history of atrial fibrillation.  He underwent cardioversion on 08/30/10.  He has remained in normal sinus rhythm since that time.  Since his last visit his amiodarone has been 200 mg one daily.

## 2011-01-02 NOTE — Progress Notes (Signed)
Doretha Imus Date of Birth:  February 27, 1941 [facility] [address]   [address] [place], [place]  [address] [phone]           Fax   [phone]  History of Present Illness: This pleasant 70 year old gentleman has a history of AV his atrial fibrillation.  He developed signs and symptoms of congestive heart failure wall living in [place] in February 2012 upon his return to [place] he was noted to have atrial fibrillation with rapid ventricular response and he was started on Pradaxa.  He subsequently was loaded with amiodarone and went on to have successful elective cardioversion on August 30, 2010 his echocardiogram at time of initial presentation showed left ventricular function to be mildly to moderately depressed with an ejection fraction estimated to be 30-40% there was mild mitral regurgitation and he had enlargement of the right and left atrial chambers and slight elevation of pulmonary artery pressure at 37.  He does not have known ischemic heart disease.  He has not had ischemic workup.  He does have a past history of labile hypertension.  He is a nonsmoker.  Current Outpatient Prescriptions  Medication Sig Dispense Refill  . Ascorbic Acid (VITAMIN C) 500 MG tablet Take 1,500 mg by mouth daily.        Marland Kitchen aspirin (BAYER ASPIRIN) 325 MG tablet Take 1 tablet (325 mg total) by mouth daily.  100 tablet  3  . Cholecalciferol (VITAMIN D) 2000 UNITS CAPS Take 4,000 Units by mouth daily.        . Coenzyme Q10 (COQ10 PO) Take 100 mg by mouth daily.       . enalapril (VASOTEC) 20 MG tablet Take 20 mg by mouth daily.        . furosemide (LASIX) 40 MG tablet Take 40 mg by mouth daily. As directed      . magnesium oxide (MAG-OX) 400 MG tablet Take 400 mg by mouth daily.        . metoprolol (TOPROL-XL) 100 MG 24 hr tablet Take 50 mg by mouth 2 (two) times daily.        . Misc Natural Products (OSTEO BI-FLEX JOINT SHIELD PO) Take by mouth daily.        . Multiple  Vitamin (MULTIVITAMIN) tablet Take 1 tablet by mouth daily.        . Omega-3 Fatty Acids (FISH OIL) 1000 MG CAPS Take by mouth 2 (two) times daily.        Marland Kitchen omeprazole (PRILOSEC) 20 MG capsule Take 20 mg by mouth daily.        Marland Kitchen zinc gluconate 50 MG tablet Take 50 mg by mouth daily.        Marland Kitchen DISCONTD: amiodarone (PACERONE) 200 MG tablet Take 1 tablet (200 mg total) by mouth daily.  30 tablet  12  . amiodarone (PACERONE) 200 MG tablet Take 0.5 tablets (100 mg total) by mouth daily.  45 tablet  3    Allergies  Allergen Reactions  . Ceftriaxone Sodium     Rocephin  . Diltiazem Hcl     Cardizem  . Penicillins     Patient Active Problem List  Diagnoses  . HYPERTENSION  . GERD  . ABDOMINAL WALL HERNIA  . OSTEOARTHROSIS NOS, UNSPECIFIED SITE  . ABRASION, LOWER EXTREMITY W/INFECTION  . NEPHROLITHIASIS, HX OF  . HIP REPLACEMENT, TOTAL, HX OF  . Atrial fibrillation  . Peripheral edema  . Swelling  . Mitral regurgitation  .  Atrial dilatation  . Orthopnea  . Dyspnea  . CHF (congestive heart failure)  . Hypertension  . Hypercholesterolemia  . Prerenal azotemia    History  Smoking status  . Never Smoker   Smokeless tobacco  . Never Used    History  Alcohol Use No    Family History  Problem Relation Age of Onset  . Cancer Mother 25    CANCER  . Heart disease Father 7    HEART PROBLEMS    Review of Systems: Constitutional: no fever chills diaphoresis or fatigue or change in weight.  Head and neck: no hearing loss, no epistaxis, no photophobia or visual disturbance. Respiratory: No cough, shortness of breath or wheezing. Cardiovascular: No chest pain peripheral edema, palpitations. Gastrointestinal: No abdominal distention, no abdominal pain, no change in bowel habits hematochezia or melena. Genitourinary: No dysuria, no frequency, no urgency, no nocturia. Musculoskeletal:No arthralgias, no back pain, no gait disturbance or myalgias. Neurological: No dizziness, no  headaches, no numbness, no seizures, no syncope, no weakness, no tremors. Hematologic: No lymphadenopathy, no easy bruising. Psychiatric: No confusion, no hallucinations, no sleep disturbance.    Physical Exam: Filed Vitals:   01/02/11 1348  BP: 120/60  Pulse: 70  The general appearance reveals a well-developed well-nourished gentleman in no distress.Pupils equal and reactive.   Extraocular Movements are full.  There is no scleral icterus.  The mouth and pharynx are normal.  The neck is supple.  The carotids reveal no bruits.  The jugular venous pressure is normal.  The thyroid is not enlarged.  There is no lymphadenopathy.The chest is clear to percussion and auscultation. There are no rales or rhonchi. Expansion of the chest is symmetrical.The precordium is quiet.  The first heart sound is normal.  The second heart sound is physiologically split.  There is no murmur gallop rub or click.  There is no abnormal lift or heave.The abdomen is soft and nontender. Bowel sounds are normal. The liver and spleen are not enlarged. There Are no abdominal masses. There are no bruits.The pedal pulses are good.  There is no phlebitis or edema.  There is no cyanosis or clubbing.Strength is normal and symmetrical in all extremities.  There is no lateralizing weakness.  There are no sensory deficits.  His heart rhythm is regular in normal sinus rhythm clinically today   Assessment / Plan: The patient will continue his present dose of furosemide 40 mg daily.  He is to decrease his amiodarone to just 100 mg daily.  He'll be rechecked in 4 months for followup office visit and EKG and a basal metabolic panel in November prior to his leaving for [place] again.

## 2011-01-02 NOTE — Assessment & Plan Note (Signed)
The patient has a past history of congestive heart failure which was secondary to sustained tachycardia. his echocardiogram on 08/15/10 showed an ejection fraction of 30-40%he has shown significant clinical improvement since restoration of normal sinus rhythm and he has been onBeta blocker and ACE inhibitor.Recently he felt that he might be going back into some congestive heart failure because of weight gain and increasing dyspnea and on his own he increased his furosemide from 20-40 mg a day with a notable increase in urine output and loss of weight and improvement in his exertional dyspnea.

## 2011-03-01 ENCOUNTER — Other Ambulatory Visit: Payer: Self-pay | Admitting: Cardiology

## 2011-03-01 DIAGNOSIS — I10 Essential (primary) hypertension: Secondary | ICD-10-CM

## 2011-03-01 DIAGNOSIS — I4891 Unspecified atrial fibrillation: Secondary | ICD-10-CM

## 2011-03-14 ENCOUNTER — Encounter: Payer: Self-pay | Admitting: Cardiology

## 2011-03-14 ENCOUNTER — Other Ambulatory Visit: Payer: Self-pay | Admitting: *Deleted

## 2011-03-14 DIAGNOSIS — R609 Edema, unspecified: Secondary | ICD-10-CM

## 2011-03-14 MED ORDER — FUROSEMIDE 40 MG PO TABS: ORAL_TABLET | ORAL | Status: DC

## 2011-03-14 NOTE — Telephone Encounter (Signed)
Refilled meds per fax request.  

## 2011-05-03 ENCOUNTER — Encounter: Payer: Self-pay | Admitting: Cardiology

## 2011-05-03 ENCOUNTER — Other Ambulatory Visit (INDEPENDENT_AMBULATORY_CARE_PROVIDER_SITE_OTHER): Payer: Medicare Other | Admitting: *Deleted

## 2011-05-03 ENCOUNTER — Ambulatory Visit (INDEPENDENT_AMBULATORY_CARE_PROVIDER_SITE_OTHER): Payer: Medicare Other | Admitting: Cardiology

## 2011-05-03 ENCOUNTER — Other Ambulatory Visit: Payer: Medicare Other | Admitting: *Deleted

## 2011-05-03 VITALS — BP 120/76 | HR 60 | Ht 71.0 in | Wt 199.0 lb

## 2011-05-03 DIAGNOSIS — I4891 Unspecified atrial fibrillation: Secondary | ICD-10-CM

## 2011-05-03 DIAGNOSIS — I10 Essential (primary) hypertension: Secondary | ICD-10-CM

## 2011-05-03 DIAGNOSIS — I509 Heart failure, unspecified: Secondary | ICD-10-CM

## 2011-05-03 DIAGNOSIS — K219 Gastro-esophageal reflux disease without esophagitis: Secondary | ICD-10-CM

## 2011-05-03 LAB — BASIC METABOLIC PANEL
CO2: 30 mEq/L (ref 19–32)
Calcium: 9.6 mg/dL (ref 8.4–10.5)
Creatinine, Ser: 1 mg/dL (ref 0.4–1.5)
Glucose, Bld: 82 mg/dL (ref 70–99)

## 2011-05-03 MED ORDER — METOPROLOL SUCCINATE ER 100 MG PO TB24: 50.0000 mg | ORAL_TABLET | Freq: Two times a day (BID) | ORAL | Status: DC

## 2011-05-03 MED ORDER — OMEPRAZOLE 20 MG PO CPDR: 20.0000 mg | DELAYED_RELEASE_CAPSULE | Freq: Every day | ORAL | Status: DC

## 2011-05-03 MED ORDER — AMIODARONE HCL 200 MG PO TABS: 100.0000 mg | ORAL_TABLET | Freq: Every day | ORAL | Status: DC

## 2011-05-03 NOTE — Assessment & Plan Note (Signed)
He has had no recurrence of his atrial fibrillation.  He is on aspirin 325 mg daily.  EKG today shows sinus bradycardia.

## 2011-05-03 NOTE — Patient Instructions (Signed)
Your Rx's have been sent to [facility] as requested.  Your physician wants you to follow-up in: 6 months with fasting labs You will receive a reminder letter in the mail two months in advance. If you don't receive a letter, please call our office to schedule the follow-up appointment.

## 2011-05-03 NOTE — Progress Notes (Signed)
Charles [name] Date of Birth:  [birth year] [facility] [address]   [address] [place], [place]  [address] [phone]           Fax   [phone]  History of Present Illness: This pleasant 69 year old gentleman is seen in 4 scheduled 4 month followup office visit.  He has a history of paroxysmal atrial fibrillation wall residing in [place] in February of 2012 he went into congestive heart failure.  When he returned to [facility] in the spring of 2012 he was found to be in rapid atrial fibrillation and he was started on Pradaxa.  He was subsequently loaded with amiodarone and had successful cardioversion in March 2012.  His ejection fraction in March 2012 was 30-40% felt to be secondary to tachycardia-induced cardiomyopathy.  He also had mild mitral regurgitation and he had biatrial enlargement and mild pulmonary hypertension.  He does not have any history of ischemic heart disease.  He has not had an ischemic workup.  He does have a long history of labile hypertension.  The course of the past year his amiodarone dose has been decreased to 100 mg daily he is tolerating it without side effects.  Current Outpatient Prescriptions  Medication Sig Dispense Refill  . amiodarone (PACERONE) 200 MG tablet Take 0.5 tablets (100 mg total) by mouth daily.  45 tablet  3  . Ascorbic Acid (VITAMIN C) 500 MG tablet Take 1,500 mg by mouth daily.        Marland Kitchen aspirin (BAYER ASPIRIN) 325 MG tablet Take 1 tablet (325 mg total) by mouth daily.  100 tablet  3  . Cholecalciferol (VITAMIN D) 2000 UNITS CAPS Take 4,000 Units by mouth daily.        . Coenzyme Q10 (COQ10 PO) Take 100 mg by mouth daily.       . enalapril (VASOTEC) 20 MG tablet Take 20 mg by mouth daily.        . furosemide (LASIX) 40 MG tablet Or as directed  30 tablet  5  . magnesium oxide (MAG-OX) 400 MG tablet Take 400 mg by mouth daily.        . metoprolol (TOPROL-XL) 100 MG 24 hr tablet Take 50 mg by mouth 2 (two) times  daily.        . Misc Natural Products (OSTEO BI-FLEX JOINT SHIELD PO) Take by mouth daily.        . Multiple Vitamin (MULTIVITAMIN) tablet Take 1 tablet by mouth daily.        . Omega-3 Fatty Acids (FISH OIL) 1000 MG CAPS Take by mouth 2 (two) times daily.        Marland Kitchen omeprazole (PRILOSEC) 20 MG capsule Take 20 mg by mouth daily.        Marland Kitchen zinc gluconate 50 MG tablet Take 50 mg by mouth daily.          Allergies  Allergen Reactions  . Ceftriaxone Sodium     Rocephin  . Diltiazem Hcl     Cardizem  . Penicillins     Patient Active Problem List  Diagnoses  . HYPERTENSION  . GERD  . ABDOMINAL WALL HERNIA  . OSTEOARTHROSIS NOS, UNSPECIFIED SITE  . ABRASION, LOWER EXTREMITY W/INFECTION  . NEPHROLITHIASIS, HX OF  . HIP REPLACEMENT, TOTAL, HX OF  . Atrial fibrillation  . Peripheral edema  . Swelling  . Mitral regurgitation  . Atrial dilatation  . Orthopnea  . Dyspnea  . CHF (congestive heart failure)  .  Hypertension  . Hypercholesterolemia  . Prerenal azotemia    History  Smoking status  . Never Smoker   Smokeless tobacco  . Never Used    History  Alcohol Use No    Family History  Problem Relation Age of Onset  . Cancer Mother 76    CANCER  . Heart disease Father 37    HEART PROBLEMS    Review of Systems: Constitutional: no fever chills diaphoresis or fatigue or change in weight.  Head and neck: no hearing loss, no epistaxis, no photophobia or visual disturbance. Respiratory: No cough, shortness of breath or wheezing. Cardiovascular: No chest pain peripheral edema, palpitations. Gastrointestinal: No abdominal distention, no abdominal pain, no change in bowel habits hematochezia or melena. Genitourinary: No dysuria, no frequency, no urgency, no nocturia. Musculoskeletal:No arthralgias, no back pain, no gait disturbance or myalgias. Neurological: No dizziness, no headaches, no numbness, no seizures, no syncope, no weakness, no tremors. Hematologic: No  lymphadenopathy, no easy bruising. Psychiatric: No confusion, no hallucinations, no sleep disturbance.    Physical Exam: Filed Vitals:   05/03/11 0853  BP: 120/76  Pulse: 60   The general appearance reveals a well-developed well-nourished gentleman in no distress.The head and neck exam reveals pupils equal and reactive.  Extraocular movements are full.  There is no scleral icterus.  The mouth and pharynx are normal.  The neck is supple.  The carotids reveal no bruits.  The jugular venous pressure is normal.  The  thyroid is not enlarged.  There is no lymphadenopathy.  The chest is clear to percussion and auscultation.  There are no rales or rhonchi.  Expansion of the chest is symmetrical.  The precordium is quiet.  The first heart sound is normal.  The second heart sound is physiologically split.  There is no murmur gallop rub or click.  There is no abnormal lift or heave.  The abdomen is soft and nontender.  The bowel sounds are normal.  The liver and spleen are not enlarged.  There are no abdominal masses.  There are no abdominal bruits.  Extremities reveal good pedal pulses.  There is no phlebitis or edema.  There is no cyanosis or clubbing.  Strength is normal and symmetrical in all extremities.  There is no lateralizing weakness.  There are no sensory deficits.  The skin is warm and dry.  There is no rash.  EKG shows sinus bradycardia with nonspecific ST abnormality  Assessment / Plan:  Continue same medication.  It is fine for him to adjust his Lasix upward down depending on fluid status and weight gain.  He will be spending the winter in a ministry in [place] again this year we'll plan to see him in late April or May when he returns to [place].

## 2011-05-03 NOTE — Assessment & Plan Note (Signed)
His blood pressure has been remaining stable on his current medication

## 2011-05-03 NOTE — Assessment & Plan Note (Signed)
Patient noted a recent increase in weight and shortness of breath which has responded to increasing his furosemide back to a full 40 mg daily.  We are checking a basal metabolic panel today to see how his potassium and renal function are.

## 2011-05-09 ENCOUNTER — Telehealth: Payer: Self-pay | Admitting: *Deleted

## 2011-05-09 NOTE — Telephone Encounter (Signed)
Advised and mailed copy  

## 2011-05-09 NOTE — Telephone Encounter (Signed)
Message copied by Burnell Blanks on Tue May 09, 2011  1:50 PM ------      Message from: [name]      Created: Wed May 03, 2011  8:19 PM       Please report.  The labs are stable.  Continue same meds.  Continue careful diet.  The potassium is good.

## 2011-06-26 ENCOUNTER — Other Ambulatory Visit: Payer: Self-pay | Admitting: Cardiology

## 2011-07-12 ENCOUNTER — Other Ambulatory Visit: Payer: Self-pay | Admitting: *Deleted

## 2011-07-12 DIAGNOSIS — R609 Edema, unspecified: Secondary | ICD-10-CM

## 2011-07-12 MED ORDER — FUROSEMIDE 40 MG PO TABS: ORAL_TABLET | ORAL | Status: DC

## 2011-08-17 ENCOUNTER — Other Ambulatory Visit: Payer: Self-pay | Admitting: *Deleted

## 2011-08-17 DIAGNOSIS — R609 Edema, unspecified: Secondary | ICD-10-CM

## 2011-08-17 MED ORDER — FUROSEMIDE 40 MG PO TABS: ORAL_TABLET | ORAL | Status: DC

## 2011-08-17 NOTE — Telephone Encounter (Signed)
Refilled furosemide

## 2011-08-29 ENCOUNTER — Telehealth: Payer: Self-pay | Admitting: Cardiology

## 2011-08-29 ENCOUNTER — Other Ambulatory Visit: Payer: Self-pay | Admitting: *Deleted

## 2011-08-29 DIAGNOSIS — Z79899 Other long term (current) drug therapy: Secondary | ICD-10-CM

## 2011-08-29 NOTE — Telephone Encounter (Signed)
New msg Pt called and said he has gained 2 to 3 pounds in one day. He wants to talk to you. Please call

## 2011-08-29 NOTE — Telephone Encounter (Signed)
Advised patient and scheduled his recall appointment

## 2011-08-29 NOTE — Telephone Encounter (Signed)
Yes the patient should go ahead and add Zaroxolyn 3 times per week until the fluid gets back to baseline.

## 2011-08-29 NOTE — Telephone Encounter (Signed)
Since the beginning of the year he felt that he was retaining fluid around his heart.  Weight up and has been more short of breath.  Was taking Lasix 40 mg 1 & 1/2 daily and increased to 2 daily with no results.  Denies any swelling in feet or legs.  States his waist has gone increased an inch and chest appears larger.  Does have Zaroxolyn on hand from using 3 days a week before but didn't want to use unless it was ok'd.  Will forward to  Dr. [name] for review

## 2011-08-31 ENCOUNTER — Other Ambulatory Visit: Payer: Self-pay | Admitting: *Deleted

## 2011-08-31 MED ORDER — ENALAPRIL MALEATE 20 MG PO TABS: 20.0000 mg | ORAL_TABLET | Freq: Every day | ORAL | Status: DC

## 2011-08-31 NOTE — Telephone Encounter (Signed)
Refilled enalapril

## 2011-10-24 ENCOUNTER — Ambulatory Visit
Admission: RE | Admit: 2011-10-24 | Discharge: 2011-10-24 | Disposition: A | Discharge status: Home / Self Care | Payer: Medicare Other | Source: Ambulatory Visit | Attending: Cardiology | Admitting: Cardiology

## 2011-10-24 ENCOUNTER — Ambulatory Visit (INDEPENDENT_AMBULATORY_CARE_PROVIDER_SITE_OTHER): Payer: Medicare Other | Admitting: Cardiology

## 2011-10-24 ENCOUNTER — Encounter: Payer: Self-pay | Admitting: Cardiology

## 2011-10-24 ENCOUNTER — Other Ambulatory Visit: Payer: Medicare Other

## 2011-10-24 ENCOUNTER — Ambulatory Visit (HOSPITAL_COMMUNITY): Payer: Medicare Other | Attending: Cardiology

## 2011-10-24 ENCOUNTER — Other Ambulatory Visit: Payer: Self-pay

## 2011-10-24 ENCOUNTER — Telehealth: Payer: Self-pay | Admitting: Cardiology

## 2011-10-24 VITALS — BP 114/62 | HR 63 | Ht 71.0 in | Wt 197.8 lb

## 2011-10-24 DIAGNOSIS — I10 Essential (primary) hypertension: Secondary | ICD-10-CM

## 2011-10-24 DIAGNOSIS — I4891 Unspecified atrial fibrillation: Secondary | ICD-10-CM

## 2011-10-24 DIAGNOSIS — R259 Unspecified abnormal involuntary movements: Secondary | ICD-10-CM

## 2011-10-24 DIAGNOSIS — I509 Heart failure, unspecified: Secondary | ICD-10-CM

## 2011-10-24 DIAGNOSIS — R609 Edema, unspecified: Secondary | ICD-10-CM

## 2011-10-24 DIAGNOSIS — R0600 Dyspnea, unspecified: Secondary | ICD-10-CM

## 2011-10-24 DIAGNOSIS — I480 Paroxysmal atrial fibrillation: Secondary | ICD-10-CM

## 2011-10-24 DIAGNOSIS — K219 Gastro-esophageal reflux disease without esophagitis: Secondary | ICD-10-CM

## 2011-10-24 DIAGNOSIS — R251 Tremor, unspecified: Secondary | ICD-10-CM

## 2011-10-24 DIAGNOSIS — R0989 Other specified symptoms and signs involving the circulatory and respiratory systems: Secondary | ICD-10-CM

## 2011-10-24 DIAGNOSIS — Z79899 Other long term (current) drug therapy: Secondary | ICD-10-CM

## 2011-10-24 DIAGNOSIS — E7800 Pure hypercholesterolemia, unspecified: Secondary | ICD-10-CM

## 2011-10-24 LAB — CBC WITH DIFFERENTIAL/PLATELET
Basophils Absolute: 0.1 K/uL (ref 0.0–0.1)
Basophils Relative: 1 % (ref 0.0–3.0)
Eosinophils Absolute: 0.3 K/uL (ref 0.0–0.7)
Eosinophils Relative: 4.2 % (ref 0.0–5.0)
HCT: 38.3 % — ABNORMAL LOW (ref 39.0–52.0)
Hemoglobin: 13.1 g/dL (ref 13.0–17.0)
Lymphocytes Relative: 28.3 % (ref 12.0–46.0)
Lymphs Abs: 1.7 K/uL (ref 0.7–4.0)
MCHC: 34.1 g/dL (ref 30.0–36.0)
MCV: 96.6 fl (ref 78.0–100.0)
Monocytes Absolute: 0.5 K/uL (ref 0.1–1.0)
Monocytes Relative: 8.7 % (ref 3.0–12.0)
Neutro Abs: 3.5 K/uL (ref 1.4–7.7)
Neutrophils Relative %: 57.8 % (ref 43.0–77.0)
Platelets: 213 K/uL (ref 150.0–400.0)
RBC: 3.97 Mil/uL — ABNORMAL LOW (ref 4.22–5.81)
RDW: 12.9 % (ref 11.5–14.6)
WBC: 6.1 K/uL (ref 4.5–10.5)

## 2011-10-24 LAB — T4, FREE: Free T4: 0.73 ng/dL (ref 0.60–1.60)

## 2011-10-24 LAB — HEPATIC FUNCTION PANEL
ALT: 27 U/L (ref 0–53)
AST: 30 U/L (ref 0–37)
Albumin: 4.4 g/dL (ref 3.5–5.2)
Alkaline Phosphatase: 41 U/L (ref 39–117)
Bilirubin, Direct: 0 mg/dL (ref 0.0–0.3)
Total Bilirubin: 0.9 mg/dL (ref 0.3–1.2)
Total Protein: 7.6 g/dL (ref 6.0–8.3)

## 2011-10-24 LAB — BASIC METABOLIC PANEL WITH GFR
BUN: 51 mg/dL — ABNORMAL HIGH (ref 6–23)
CO2: 31 meq/L (ref 19–32)
Calcium: 10.1 mg/dL (ref 8.4–10.5)
Chloride: 102 meq/L (ref 96–112)
Creatinine, Ser: 1.8 mg/dL — ABNORMAL HIGH (ref 0.4–1.5)
GFR: 39.78 mL/min — ABNORMAL LOW
Glucose, Bld: 124 mg/dL — ABNORMAL HIGH (ref 70–99)
Potassium: 4.1 meq/L (ref 3.5–5.1)
Sodium: 141 meq/L (ref 135–145)

## 2011-10-24 LAB — LIPID PANEL
Cholesterol: 192 mg/dL (ref 0–200)
HDL: 39 mg/dL — ABNORMAL LOW (ref >39.00)
Triglycerides: 284 mg/dL — ABNORMAL HIGH (ref 0.0–149.0)
VLDL: 56.8 mg/dL — ABNORMAL HIGH (ref 0.0–40.0)

## 2011-10-24 LAB — LDL CHOLESTEROL, DIRECT: Direct LDL: 95.3 mg/dL

## 2011-10-24 MED ORDER — FUROSEMIDE 40 MG PO TABS: ORAL_TABLET | ORAL | Status: DC

## 2011-10-24 MED ORDER — METOLAZONE 5 MG PO TABS: ORAL_TABLET | ORAL | Status: DC

## 2011-10-24 NOTE — Assessment & Plan Note (Signed)
His blood pressure has been maintaining normal.  He checks it at home.

## 2011-10-24 NOTE — Assessment & Plan Note (Signed)
The patient has exertional dyspnea.  He still walks 3 miles a day for exercise but is more fatigued and more short of breath.  He has also had a lot of cough and chest congestion.  He has been taking furosemide 80 mg daily in the form of 2 tablets of 40 mg.  He has also had Zaroxolyn 5 mg tablets on hand and has been taking them 2 or 3 times a week as needed for extra fluid.

## 2011-10-24 NOTE — Assessment & Plan Note (Signed)
Has not been aware of any recurrent atrial fibrillation.  Does have occasional isolated premature beats

## 2011-10-24 NOTE — Patient Instructions (Addendum)
STOP AMIODARONE  Go for chest xray   Ok to continue Zaroxolyn 2 x a week as needed for swelling (call with mg of Zaroxolyn)  Your physician has requested that you have an echocardiogram. Echocardiography is a painless test that uses sound waves to create images of your heart. It provides your doctor with information about the size and shape of your heart and how well your heart's chambers and valves are working. This procedure takes approximately one hour. There are no restrictions for this procedure.  Your physician recommends that you schedule a follow-up appointment in: 4 month ov/ekg/bmet

## 2011-10-24 NOTE — Progress Notes (Signed)
Charles [name] Date of Birth:  1940/12/24 [facility] [address] [place], [place]  [address] [phone]         Fax   [phone]  History of Present Illness: This pleasant 71 year old gentleman is seen for a four-month followup office visit.  He has a past history of paroxysmal atrial fibrillation.  As a past history of systolic congestive heart failure with depressed LV systolic function by previous echocardiogram done on 08/15/10.  His atrial fibrillation was first noted in the winter of 2012.  He was in [place] at the time.  When he returned to [facility] in the spring of 2012 he was started on pradaxa and subsequently had successful cardioversion in March 2012.  His ejection fraction in March 2012 was in the range of 30-40% felt to be secondary to tachycardia-induced cardiomyopathy.  Echocardiogram at that time also showed biatrial enlargement mild pulmonary hypertension and mild mitral regurgitation.  He does not have any history of ischemic heart disease.  He has not had an ischemic workup.  He does have a past history of labile hypertension.  The course of the past year his amiodarone dose has been tapered to 100 mg daily  Current Outpatient Prescriptions  Medication Sig Dispense Refill  . Ascorbic Acid (VITAMIN C) 500 MG tablet Take 1,500 mg by mouth daily.        Marland Kitchen aspirin 325 MG tablet Take 325 mg by mouth daily.      . Cholecalciferol (VITAMIN D) 2000 UNITS CAPS Take 4,000 Units by mouth daily.        . Coenzyme Q10 (COQ10 PO) Take 100 mg by mouth daily.       . enalapril (VASOTEC) 20 MG tablet Take 1 tablet (20 mg total) by mouth daily.  180 tablet  3  . furosemide (LASIX) 40 MG tablet 2 daily  180 tablet  3  . magnesium oxide (MAG-OX) 400 MG tablet Take 400 mg by mouth daily.        . metoprolol (TOPROL-XL) 100 MG 24 hr tablet Take 0.5 tablets (50 mg total) by mouth 2 (two) times daily.  90 tablet  3  . Misc Natural Products (OSTEO BI-FLEX JOINT SHIELD  PO) Take by mouth daily.        . Multiple Vitamin (MULTIVITAMIN) tablet Take 1 tablet by mouth daily.        . Omega-3 Fatty Acids (FISH OIL) 1000 MG CAPS Take by mouth 2 (two) times daily.        Marland Kitchen omeprazole (PRILOSEC) 20 MG capsule TAKE 1 CAPSULE BY MOUTH EVERY DAY  90 capsule  3  . zinc gluconate 50 MG tablet Take 50 mg by mouth daily.        Marland Kitchen DISCONTD: furosemide (LASIX) 40 MG tablet Or as directed  30 tablet  5  . metolazone (ZAROXOLYN) 5 MG tablet 1 tablet 1 to 2 times a week for swelling or as directed  15 tablet  5    Allergies  Allergen Reactions  . Ceftriaxone Sodium     Rocephin  . Diltiazem Hcl     Cardizem  . Penicillins     Patient Active Problem List  Diagnoses  . HYPERTENSION  . GERD  . ABDOMINAL WALL HERNIA  . OSTEOARTHROSIS NOS, UNSPECIFIED SITE  . ABRASION, LOWER EXTREMITY W/INFECTION  . NEPHROLITHIASIS, HX OF  . HIP REPLACEMENT, TOTAL, HX OF  . Atrial fibrillation  . Peripheral edema  . Swelling  . Mitral regurgitation  .  Atrial dilatation  . Orthopnea  . Dyspnea  . CHF (congestive heart failure)  . Hypertension  . Hypercholesterolemia  . Prerenal azotemia    History  Smoking status  . Never Smoker   Smokeless tobacco  . Never Used    History  Alcohol Use No    Family History  Problem Relation Age of Onset  . Cancer Mother 44    CANCER  . Heart disease Father 36    HEART PROBLEMS    Review of Systems: Constitutional: no fever chills diaphoresis or fatigue or change in weight.  Head and neck: no hearing loss, no epistaxis, no photophobia or visual disturbance. Respiratory: No cough, shortness of breath or wheezing. Cardiovascular: No chest pain peripheral edema, palpitations. Gastrointestinal: No abdominal distention, no abdominal pain, no change in bowel habits hematochezia or melena. Genitourinary: No dysuria, no frequency, no urgency, no nocturia. Musculoskeletal:No arthralgias, no back pain, no gait disturbance or  myalgias. Neurological: No dizziness, no headaches, no numbness, no seizures, no syncope, no weakness, no tremors. Hematologic: No lymphadenopathy, no easy bruising. Psychiatric: No confusion, no hallucinations, no sleep disturbance.    Physical Exam: Filed Vitals:   10/24/11 0929  BP: 114/62  Pulse: 63   the general appearance reveals a well-developed well-nourished gentleman in no distress.Pupils equal and reactive.   Extraocular Movements are full.  There is no scleral icterus.  The mouth and pharynx are normal.  The neck is supple.  The carotids reveal no bruits.  The jugular venous pressure is normal.  The thyroid is not enlarged.  There is no lymphadenopathy.  The chest is clear to percussion and auscultation. There are no rales or rhonchi. Expansion of the chest is symmetrical.  The precordium is quiet.  The first heart sound is normal.  The second heart sound is physiologically split.  There is no murmur gallop rub or click.  There is no abnormal lift or heave.  The abdomen is soft and nontender. Bowel sounds are normal. The liver and spleen are not enlarged. There Are no abdominal masses. There are no bruits.  The pedal pulses are good.  There is no phlebitis or edema.  There is no cyanosis or clubbing.  EKG today shows normal sinus rhythm at 63 per minute.  There are nonspecific ST-T wave changes.   Assessment / Plan: Because of his tremor we are stopping his amiodarone. We will update his chest x-ray and his echocardiogram.  He'll continue same medications otherwise be rechecked in 4 months for followup office visit EKG and basal metabolic panel.  Blood work today is pending.  If left ventricular function is still poor by echo we will consider an ischemic workup.

## 2011-10-24 NOTE — Telephone Encounter (Signed)
New msg [name] called and gave Zaroxolyn dosage 5 mg

## 2011-10-24 NOTE — Telephone Encounter (Signed)
Refilled Rx discussed at today's office visit

## 2011-10-24 NOTE — Assessment & Plan Note (Signed)
The patient has had a worsening tremor of his outstretched hands particularly his left hand.  This is probably from the long-term amiodarone usage.  We will stop the amiodarone at this point.

## 2011-10-25 ENCOUNTER — Telehealth: Payer: Self-pay | Admitting: *Deleted

## 2011-10-25 DIAGNOSIS — E039 Hypothyroidism, unspecified: Secondary | ICD-10-CM

## 2011-10-25 DIAGNOSIS — Z79899 Other long term (current) drug therapy: Secondary | ICD-10-CM

## 2011-10-25 MED ORDER — LEVOTHYROXINE SODIUM 25 MCG PO TABS: 25.0000 ug | ORAL_TABLET | Freq: Every day | ORAL | Status: DC

## 2011-10-25 NOTE — Telephone Encounter (Signed)
Message copied by Burnell Blanks on Wed Oct 25, 2011 12:53 PM ------      Message from: [name]      Created: Tue Oct 24, 2011  7:29 PM       2D echo shows EF has returned to normal since last year. Valves are good. EF 55-60%

## 2011-10-25 NOTE — Telephone Encounter (Signed)
Message copied by Burnell Blanks on Wed Oct 25, 2011 12:53 PM ------      Message from: [name]      Created: Tue Oct 24, 2011  7:27 PM       Chest xray shows stable heart size and no CHF      Incidental left kidney stone seen. Drink lots of water.

## 2011-10-25 NOTE — Telephone Encounter (Signed)
Message copied by Burnell Blanks on Wed Oct 25, 2011 12:54 PM ------      Message from: [name]      Created: Tue Oct 24, 2011  7:25 PM       Please report.  The cholesterol is normal but the triglycerides are very high.  Needs to cut back on sugars and starches.  Blood sugar is high also 124.  May indicate early diabetes. Watch diet.      Kidneys are too dry so needs to drink more water.  Leave off zaroxolyn unless severe edema.        Liver tests are normal.      CBC shows no anemia.      Thyroid is underactive.  This is probably from the amiodarone which has now been stopped. The low thyroid can cause malaise and fatigue.  Add generic synthroid 25 mcg daily.      Return in 4-6 weeks for repeat TSH and BMET(H)

## 2011-10-25 NOTE — Telephone Encounter (Signed)
Advised patient of labs, echo, and xray results.  Scheduled follow up labs and sent Rx to [facility].  Mailed copy of information as requested.  Tried to schedule labs closer to 4 weeks but patient will be out of town

## 2011-12-05 ENCOUNTER — Other Ambulatory Visit (INDEPENDENT_AMBULATORY_CARE_PROVIDER_SITE_OTHER): Payer: Medicare Other

## 2011-12-05 DIAGNOSIS — Z79899 Other long term (current) drug therapy: Secondary | ICD-10-CM

## 2011-12-05 DIAGNOSIS — E039 Hypothyroidism, unspecified: Secondary | ICD-10-CM

## 2011-12-05 LAB — BASIC METABOLIC PANEL
Calcium: 9.6 mg/dL (ref 8.4–10.5)
Creatinine, Ser: 1.3 mg/dL (ref 0.4–1.5)

## 2011-12-05 LAB — TSH: TSH: 8.08 u[IU]/mL — ABNORMAL HIGH (ref 0.35–5.50)

## 2011-12-08 ENCOUNTER — Telehealth: Payer: Self-pay | Admitting: *Deleted

## 2011-12-08 DIAGNOSIS — E0590 Thyrotoxicosis, unspecified without thyrotoxic crisis or storm: Secondary | ICD-10-CM

## 2011-12-08 NOTE — Telephone Encounter (Signed)
Message copied by Burnell Blanks on Fri Dec 08, 2011  6:57 PM ------      Message from: [name]      Created: Tue Dec 05, 2011  9:43 PM       Thyroid still underactive so increase synthroid to 50 mcg daily. Recheck TSH in august at next OV.  Kidney function is much better.  BS better.

## 2011-12-08 NOTE — Telephone Encounter (Signed)
Message copied by Burnell Blanks on Fri Dec 08, 2011  6:51 PM ------      Message from: [name]      Created: Tue Dec 05, 2011  9:43 PM       Thyroid still underactive so increase synthroid to 50 mcg daily. Recheck TSH in august at next OV.  Kidney function is much better.  BS better.

## 2011-12-08 NOTE — Telephone Encounter (Signed)
Advised wife and mailed copy 

## 2012-01-29 ENCOUNTER — Ambulatory Visit (INDEPENDENT_AMBULATORY_CARE_PROVIDER_SITE_OTHER): Payer: Medicare Other | Admitting: Cardiology

## 2012-01-29 ENCOUNTER — Other Ambulatory Visit (INDEPENDENT_AMBULATORY_CARE_PROVIDER_SITE_OTHER): Payer: Medicare Other

## 2012-01-29 ENCOUNTER — Encounter: Payer: Self-pay | Admitting: Cardiology

## 2012-01-29 VITALS — BP 110/68 | HR 60 | Ht 71.0 in | Wt 198.0 lb

## 2012-01-29 DIAGNOSIS — I509 Heart failure, unspecified: Secondary | ICD-10-CM

## 2012-01-29 DIAGNOSIS — R251 Tremor, unspecified: Secondary | ICD-10-CM

## 2012-01-29 DIAGNOSIS — Z79899 Other long term (current) drug therapy: Secondary | ICD-10-CM

## 2012-01-29 DIAGNOSIS — E039 Hypothyroidism, unspecified: Secondary | ICD-10-CM

## 2012-01-29 DIAGNOSIS — E0590 Thyrotoxicosis, unspecified without thyrotoxic crisis or storm: Secondary | ICD-10-CM

## 2012-01-29 DIAGNOSIS — I4891 Unspecified atrial fibrillation: Secondary | ICD-10-CM

## 2012-01-29 DIAGNOSIS — I10 Essential (primary) hypertension: Secondary | ICD-10-CM

## 2012-01-29 DIAGNOSIS — R259 Unspecified abnormal involuntary movements: Secondary | ICD-10-CM

## 2012-01-29 LAB — BASIC METABOLIC PANEL
BUN: 25 mg/dL — ABNORMAL HIGH (ref 6–23)
Creatinine, Ser: 1.1 mg/dL (ref 0.4–1.5)
GFR: 66.9 mL/min (ref >60.00)
Glucose, Bld: 112 mg/dL — ABNORMAL HIGH (ref 70–99)

## 2012-01-29 LAB — TSH: TSH: 3.11 u[IU]/mL (ref 0.35–5.50)

## 2012-01-29 NOTE — Progress Notes (Signed)
Charles [name] Date of Birth:  Mar 02, 1941 [facility] [address] [name], [place]  [address] [phone]         Fax   [phone]  History of Present Illness: This pleasant 71 year old gentleman is seen for a scheduled four-month followup office visit.  He has a past history of paroxysmal atrial fibrillation and tachycardia mediated cardiomyopathy.  He is now back in sinus rhythm and his most recent echocardiogram on 10/24/11 shows his ejection fraction has returned to normal at 55-60%.  On his last office visit we stopped his amiodarone because of a tremor in his right hand.  The tremor has not improved off amiodarone.  Shortly the patient has not gone back into atrial fibrillation.  The patient did develop mild hypothyroidism on amiodarone and we are rechecking his thyroid function today  Current Outpatient Prescriptions  Medication Sig Dispense Refill  . Ascorbic Acid (VITAMIN C) 500 MG tablet Take 1,500 mg by mouth daily.        Marland Kitchen aspirin 325 MG tablet Take 325 mg by mouth daily.      . Cholecalciferol (VITAMIN D) 2000 UNITS CAPS Take 4,000 Units by mouth daily.        . Coenzyme Q10 (COQ10 PO) Take 100 mg by mouth daily.       . enalapril (VASOTEC) 20 MG tablet Take 1 tablet (20 mg total) by mouth daily.  180 tablet  3  . furosemide (LASIX) 40 MG tablet 2 daily  180 tablet  3  . levothyroxine (LEVOTHROID) 25 MCG tablet Take 1 tablet (25 mcg total) by mouth daily.  30 tablet  5  . magnesium oxide (MAG-OX) 400 MG tablet Take 400 mg by mouth daily.        . metoprolol (TOPROL-XL) 100 MG 24 hr tablet Take 0.5 tablets (50 mg total) by mouth 2 (two) times daily.  90 tablet  3  . Misc Natural Products (OSTEO BI-FLEX JOINT SHIELD PO) Take by mouth daily.        . Multiple Vitamin (MULTIVITAMIN) tablet Take 1 tablet by mouth daily.        . Omega-3 Fatty Acids (FISH OIL) 1000 MG CAPS Take by mouth 2 (two) times daily.        Marland Kitchen omeprazole (PRILOSEC) 20 MG capsule TAKE  1 CAPSULE BY MOUTH EVERY DAY  90 capsule  3  . zinc gluconate 50 MG tablet Take 50 mg by mouth daily.          Allergies  Allergen Reactions  . Ceftriaxone Sodium     Rocephin  . Diltiazem Hcl     Cardizem  . Penicillins     Patient Active Problem List  Diagnosis  . HYPERTENSION  . GERD  . ABDOMINAL WALL HERNIA  . OSTEOARTHROSIS NOS, UNSPECIFIED SITE  . ABRASION, LOWER EXTREMITY W/INFECTION  . NEPHROLITHIASIS, HX OF  . HIP REPLACEMENT, TOTAL, HX OF  . Atrial fibrillation  . Peripheral edema  . Swelling  . Mitral regurgitation  . Atrial dilatation  . Orthopnea  . Dyspnea  . CHF (congestive heart failure)  . Hypertension  . Hypercholesterolemia  . Prerenal azotemia  . Tremor    History  Smoking status  . Never Smoker   Smokeless tobacco  . Never Used    History  Alcohol Use No    Family History  Problem Relation Age of Onset  . Cancer Mother 7    CANCER  . Heart disease Father 66  HEART PROBLEMS    Review of Systems: Constitutional: no fever chills diaphoresis or fatigue or change in weight.  Head and neck: no hearing loss, no epistaxis, no photophobia or visual disturbance. Respiratory: No cough, shortness of breath or wheezing. Cardiovascular: No chest pain peripheral edema, palpitations. Gastrointestinal: No abdominal distention, no abdominal pain, no change in bowel habits hematochezia or melena. Genitourinary: No dysuria, no frequency, no urgency, no nocturia. Musculoskeletal:No arthralgias, no back pain, no gait disturbance or myalgias. Neurological: No dizziness, no headaches, no numbness, no seizures, no syncope, no weakness, no tremors. Hematologic: No lymphadenopathy, no easy bruising. Psychiatric: No confusion, no hallucinations, no sleep disturbance.    Physical Exam: Filed Vitals:   01/29/12 0843  BP: 110/68  Pulse: 60   the general appearance reveals a well-developed well-nourished gentleman in no distress.The head and neck exam  reveals pupils equal and reactive.  Extraocular movements are full.  There is no scleral icterus.  The mouth and pharynx are normal.  The neck is supple.  The carotids reveal no bruits.  The jugular venous pressure is normal.  The  thyroid is not enlarged.  There is no lymphadenopathy.  The chest is clear to percussion and auscultation.  There are no rales or rhonchi.  Expansion of the chest is symmetrical.  The precordium is quiet.  The first heart sound is normal.  The second heart sound is physiologically split.  There is no murmur gallop rub or click.  There is no abnormal lift or heave.  The abdomen is soft and nontender.  The bowel sounds are normal.  The liver and spleen are not enlarged.  There are no abdominal masses.  There are no abdominal bruits.  Extremities reveal good pedal pulses.  There is no phlebitis or edema.  There is no cyanosis or clubbing.  Strength is normal and symmetrical in all extremities.  There is no lateralizing weakness.  There are no sensory deficits.  The skin is warm and dry.  There is no rash.  EKG confirms normal sinus rhythm and is within normal limits   Assessment / Plan: Recheck in 4 months for followup office visit EKG basal metabolic panel and TSH.  He also raised the question of possible sleep apnea but does not want to proceed with any evaluation of this until next spring or summer

## 2012-01-29 NOTE — Assessment & Plan Note (Signed)
Blood pressure was remaining stable on current therapy 

## 2012-01-29 NOTE — Patient Instructions (Addendum)
Will obtain labs today and call you with the results   Your physician recommends that you continue on your current medications as directed. Please refer to the Current Medication list given to you today.  Your physician recommends that you schedule a follow-up appointment in: 4 months with fasting labs (bmet/tsh) and EKG

## 2012-01-29 NOTE — Progress Notes (Signed)
Quick Note:  Please report to patient. The recent labs are stable. Continue same medication and careful diet. Thyroid good on current Rx. ______

## 2012-01-29 NOTE — Assessment & Plan Note (Signed)
The patient has not been aware of any recurrent atrial fibrillation.  He has had no TIA symptoms.

## 2012-01-31 ENCOUNTER — Telehealth: Payer: Self-pay | Admitting: *Deleted

## 2012-01-31 NOTE — Telephone Encounter (Signed)
Mailed copy of labs and left message to call if any questions  

## 2012-01-31 NOTE — Telephone Encounter (Signed)
Message copied by Burnell Blanks on Wed Jan 31, 2012  3:15 PM ------      Message from: [name]      Created: Mon Jan 29, 2012  9:06 PM       Please report to patient.  The recent labs are stable. Continue same medication and careful diet. Thyroid good on current Rx.

## 2012-02-20 ENCOUNTER — Other Ambulatory Visit: Payer: Self-pay | Admitting: Dermatology

## 2012-02-22 ENCOUNTER — Telehealth: Payer: Self-pay | Admitting: Cardiology

## 2012-02-22 DIAGNOSIS — E039 Hypothyroidism, unspecified: Secondary | ICD-10-CM

## 2012-02-22 DIAGNOSIS — Z79899 Other long term (current) drug therapy: Secondary | ICD-10-CM

## 2012-02-22 MED ORDER — LEVOTHYROXINE SODIUM 50 MCG PO TABS: 50.0000 ug | ORAL_TABLET | Freq: Every day | ORAL | Status: DC

## 2012-02-22 NOTE — Telephone Encounter (Signed)
[name] calling re change in prescription

## 2012-02-22 NOTE — Telephone Encounter (Signed)
Patient had increase Levothyroxine to 50 mcg in June after labs, new Rx sent to pharmacy

## 2012-06-03 ENCOUNTER — Ambulatory Visit (INDEPENDENT_AMBULATORY_CARE_PROVIDER_SITE_OTHER): Payer: Medicare Other | Admitting: Cardiology

## 2012-06-03 ENCOUNTER — Other Ambulatory Visit (INDEPENDENT_AMBULATORY_CARE_PROVIDER_SITE_OTHER): Payer: Medicare Other

## 2012-06-03 ENCOUNTER — Encounter: Payer: Self-pay | Admitting: Cardiology

## 2012-06-03 VITALS — BP 114/70 | HR 160 | Ht 71.0 in | Wt 198.0 lb

## 2012-06-03 DIAGNOSIS — I509 Heart failure, unspecified: Secondary | ICD-10-CM

## 2012-06-03 DIAGNOSIS — E039 Hypothyroidism, unspecified: Secondary | ICD-10-CM

## 2012-06-03 DIAGNOSIS — I10 Essential (primary) hypertension: Secondary | ICD-10-CM

## 2012-06-03 DIAGNOSIS — I4891 Unspecified atrial fibrillation: Secondary | ICD-10-CM

## 2012-06-03 DIAGNOSIS — I480 Paroxysmal atrial fibrillation: Secondary | ICD-10-CM

## 2012-06-03 DIAGNOSIS — E0590 Thyrotoxicosis, unspecified without thyrotoxic crisis or storm: Secondary | ICD-10-CM

## 2012-06-03 DIAGNOSIS — Z79899 Other long term (current) drug therapy: Secondary | ICD-10-CM

## 2012-06-03 LAB — BASIC METABOLIC PANEL
BUN: 29 mg/dL — ABNORMAL HIGH (ref 6–23)
Calcium: 9.5 mg/dL (ref 8.4–10.5)
Creatinine, Ser: 1.5 mg/dL (ref 0.4–1.5)

## 2012-06-03 MED ORDER — LEVOTHYROXINE SODIUM 50 MCG PO TABS: 50.0000 ug | ORAL_TABLET | Freq: Every day | ORAL | Status: DC

## 2012-06-03 MED ORDER — METOPROLOL SUCCINATE ER 100 MG PO TB24: ORAL_TABLET | ORAL | Status: DC

## 2012-06-03 MED ORDER — OMEPRAZOLE 20 MG PO CPDR: 20.0000 mg | DELAYED_RELEASE_CAPSULE | Freq: Every day | ORAL | Status: DC

## 2012-06-03 NOTE — Progress Notes (Signed)
Charles [name] Date of Birth:  1940/11/15 [facility] [address] [place], [place]  [address] [phone]         Fax   [phone]  History of Present Illness: This pleasant 72 year old gentleman is seen for a scheduled four-month followup office visit. He has a past history of paroxysmal atrial fibrillation and tachycardia mediated cardiomyopathy. He is now back in sinus rhythm and his most recent echocardiogram on 10/24/11 shows his ejection fraction has returned to normal at 55-60%. On his last office visit we stopped his amiodarone because of a tremor in his right hand. The tremor has not improved off amiodarone.  The patient checks his blood pressure each day.  Normally the pulse rate on the meter is 60.  He did not check his vital signs today because he was coming to the office.  Today he was surprised to see that his heart rate was 160 and irregular although he himself was unaware of it clinically.   Current Outpatient Prescriptions  Medication Sig Dispense Refill  . Ascorbic Acid (VITAMIN C) 500 MG tablet Take 1,500 mg by mouth daily.        Marland Kitchen aspirin 325 MG tablet Take 325 mg by mouth daily.      . Cholecalciferol (VITAMIN D) 2000 UNITS CAPS Take 4,000 Units by mouth daily.        . Coenzyme Q10 (COQ10 PO) Take 100 mg by mouth daily.       . enalapril (VASOTEC) 20 MG tablet Take 1 tablet (20 mg total) by mouth daily.  180 tablet  3  . furosemide (LASIX) 40 MG tablet 2 daily  180 tablet  3  . levothyroxine (LEVOTHROID) 50 MCG tablet Take 1 tablet (50 mcg total) by mouth daily.  30 tablet  5  . magnesium oxide (MAG-OX) 400 MG tablet Take 400 mg by mouth daily.        . metoprolol (TOPROL-XL) 100 MG 24 hr tablet Take 0.5 tablets (50 mg total) by mouth 2 (two) times daily.  90 tablet  3  . Misc Natural Products (OSTEO BI-FLEX JOINT SHIELD PO) Take by mouth daily.        . Multiple Vitamin (MULTIVITAMIN) tablet Take 1 tablet by mouth daily.        . Omega-3  Fatty Acids (FISH OIL) 1000 MG CAPS Take by mouth 2 (two) times daily.        Marland Kitchen omeprazole (PRILOSEC) 20 MG capsule TAKE 1 CAPSULE BY MOUTH EVERY DAY  90 capsule  3  . zinc gluconate 50 MG tablet Take 50 mg by mouth daily.          Allergies  Allergen Reactions  . Ceftriaxone Sodium     Rocephin  . Diltiazem Hcl     Cardizem  . Penicillins     Patient Active Problem List  Diagnosis  . HYPERTENSION  . GERD  . ABDOMINAL WALL HERNIA  . OSTEOARTHROSIS NOS, UNSPECIFIED SITE  . ABRASION, LOWER EXTREMITY W/INFECTION  . NEPHROLITHIASIS, HX OF  . HIP REPLACEMENT, TOTAL, HX OF  . Atrial fibrillation  . Peripheral edema  . Swelling  . Mitral regurgitation  . Atrial dilatation  . Orthopnea  . Dyspnea  . CHF (congestive heart failure)  . Hypertension  . Hypercholesterolemia  . Prerenal azotemia  . Tremor    History  Smoking status  . Never Smoker   Smokeless tobacco  . Never Used    History  Alcohol Use No  Family History  Problem Relation Age of Onset  . Cancer Mother 64    CANCER  . Heart disease Father 54    HEART PROBLEMS    Review of Systems: Constitutional: no fever chills diaphoresis or fatigue or change in weight.  Head and neck: no hearing loss, no epistaxis, no photophobia or visual disturbance. Respiratory: No cough, shortness of breath or wheezing. Cardiovascular: No chest pain peripheral edema, palpitations. Gastrointestinal: No abdominal distention, no abdominal pain, no change in bowel habits hematochezia or melena. Genitourinary: No dysuria, no frequency, no urgency, no nocturia. Musculoskeletal:No arthralgias, no back pain, no gait disturbance or myalgias. Neurological: No dizziness, no headaches, no numbness, no seizures, no syncope, no weakness, no tremors. Hematologic: No lymphadenopathy, no easy bruising. Psychiatric: No confusion, no hallucinations, no sleep disturbance.    Physical Exam: Filed Vitals:   06/03/12 0904  BP: 114/70    Pulse: 160   the general appearance reveals a well-developed well-nourished gentleman in no distress.The head and neck exam reveals pupils equal and reactive.  Extraocular movements are full.  There is no scleral icterus.  The mouth and pharynx are normal.  The neck is supple.  The carotids reveal no bruits.  The jugular venous pressure is normal.  The  thyroid is not enlarged.  There is no lymphadenopathy.  The chest is clear to percussion and auscultation.  There are no rales or rhonchi.  Expansion of the chest is symmetrical.  The precordium is quiet.  The pulse is rapid and irregular  The first heart sound is normal.  The second heart sound is physiologically split.  There is no murmur gallop rub or click.  There is no abnormal lift or heave.  The abdomen is soft and nontender.  The bowel sounds are normal.  The liver and spleen are not enlarged.  There are no abdominal masses.  There are no abdominal bruits.  Extremities reveal good pedal pulses.  There is no phlebitis or edema.  There is no cyanosis or clubbing.  Strength is normal and symmetrical in all extremities.  There is no lateralizing weakness.  There are no sensory deficits.  The skin is warm and dry.  There is no rash.  EKG shows atrial fibrillation with a rapid ventricular response   Assessment / Plan: Stop aspirin and switch to Pradaxa 150 mg twice a day and he has samples.  He will let us know in 48 hours when his pulse is doing.  He plans to go to [place] leaving on December 30 for 3 or 4 months as usual.  Recheck in 4 months for followup office visit EKG and basal metabolic panel

## 2012-06-03 NOTE — Patient Instructions (Addendum)
Will obtain labs today and call you with the results (tsh/bmet)  Restart your Pradaxa 150 mg twice a day and continue all other medications.   Call Wednesday morning and update on how you are doing [phone])  Your physician wants you to follow-up in: 4 MONTHS OV/BMET/EKG You will receive a reminder letter in the mail two months in advance. If you don't receive a letter, please call our office to schedule the follow-up appointment.

## 2012-06-03 NOTE — Assessment & Plan Note (Signed)
By his history he has been staying in normal sinus rhythm until today.  In the past he has been known to come in and out of paroxysmal atrial fibrillation quickly.  He has samples of Pradaxa at home already and we will have him go back on Pradaxa 150 mg twice a day short-term she will call us on December 11 to let us know what his pulse is.  If he remains in atrial fibrillation he will have to stay on Pradaxa.  The Pradaxa is expensive for him he would prefer not to take it.  If he goes back into normal rhythm he will revert back to aspirin daily until his next episode of atrial fib.  It is important that he checks his pulse each day

## 2012-06-03 NOTE — Assessment & Plan Note (Signed)
The patient is not having any symptoms of congestive heart failure.  His weight is unchanged

## 2012-06-03 NOTE — Assessment & Plan Note (Signed)
Blood pressure is stable on current dose of beta blocker

## 2012-06-03 NOTE — Progress Notes (Signed)
Quick Note:  Please report to patient. The recent labs are stable. Continue same medication and careful diet. The kidneys are slightly drier. Continue to drink plenty of water. ______

## 2012-06-05 ENCOUNTER — Telehealth: Payer: Self-pay | Admitting: *Deleted

## 2012-06-05 DIAGNOSIS — I4891 Unspecified atrial fibrillation: Secondary | ICD-10-CM

## 2012-06-05 MED ORDER — DABIGATRAN ETEXILATE MESYLATE 150 MG PO CAPS: 150.0000 mg | ORAL_CAPSULE | Freq: Two times a day (BID) | ORAL | Status: DC

## 2012-06-05 NOTE — Telephone Encounter (Signed)
Called patient and advised of labs.  Patient was to call today and update about his rhythm.  Per patient he states he is still OUT of rhythm. Will forward to  Dr. [name] for review

## 2012-06-05 NOTE — Telephone Encounter (Signed)
Message copied by Burnell Blanks on Wed Jun 05, 2012 12:28 PM ------      Message from: [name]      Created: Mon Jun 03, 2012  4:58 PM       Please report to patient.  The recent labs are stable. Continue same medication and careful diet.  The kidneys are slightly drier.  Continue to drink plenty of water.

## 2012-06-05 NOTE — Telephone Encounter (Signed)
Advised patient. Did discuss Pradaxa dose with  Dr. [name] and should be 150 mg twice a day. Patient will call back in a couple weeks with update

## 2012-06-05 NOTE — Telephone Encounter (Signed)
Continue Pradaxa and 50 mg twice a day to prevent stroke. Increase metoprolol to a whole tablet in the morning and half tablet in the evening.

## 2012-06-17 ENCOUNTER — Telehealth: Payer: Self-pay | Admitting: Cardiology

## 2012-06-17 NOTE — Telephone Encounter (Signed)
Pt was in a-fib and now has converted back to sinus rhythm

## 2012-06-17 NOTE — Telephone Encounter (Signed)
Advised patient Pradaxa samples left at front (150 mg twice a day)  #5 exp 5/15 lot #[id number]

## 2012-06-17 NOTE — Telephone Encounter (Signed)
Glad to hear that he has converted back to sinus rhythm.  He is at least a chadss 2 for her hypertension and prior congestive heart failure and possibly a level 3 because of hyperglycemia also.  For this reason he should stay on full anticoagulation to protect against strokes.  Continue Pradaxa unless he wants to switch to warfarin but that would require blood tests while he is away in [place]. I would continue him on his current dose of metoprolol to help hold him in rhythm. While on full anticoagulation he does not have to take a full aspirin unless he needs it for arthritis.

## 2012-06-17 NOTE — Telephone Encounter (Signed)
?  continue Pradaxa and increase dose of Metoprolol  Will forward to  Dr. [name] for review

## 2012-07-26 ENCOUNTER — Telehealth: Payer: Self-pay | Admitting: Cardiology

## 2012-07-26 NOTE — Telephone Encounter (Signed)
New Problem:    Patient has gone into afib.  Please call back.

## 2012-07-26 NOTE — Telephone Encounter (Signed)
Patient in [place] coming back to town this weekend. Been back in A fib for amount a month.   Has continued Pradaxa since last ov. Would like to see  Dr. [name] soon. Scheduled appointment.

## 2012-07-29 ENCOUNTER — Encounter: Payer: Self-pay | Admitting: Cardiology

## 2012-07-29 ENCOUNTER — Ambulatory Visit (INDEPENDENT_AMBULATORY_CARE_PROVIDER_SITE_OTHER): Payer: Medicare Other | Admitting: Cardiology

## 2012-07-29 VITALS — BP 101/72 | HR 97 | Resp 18 | Ht 71.0 in | Wt 197.0 lb

## 2012-07-29 DIAGNOSIS — I4891 Unspecified atrial fibrillation: Secondary | ICD-10-CM

## 2012-07-29 DIAGNOSIS — I509 Heart failure, unspecified: Secondary | ICD-10-CM

## 2012-07-29 DIAGNOSIS — R259 Unspecified abnormal involuntary movements: Secondary | ICD-10-CM

## 2012-07-29 DIAGNOSIS — R251 Tremor, unspecified: Secondary | ICD-10-CM

## 2012-07-29 MED ORDER — WARFARIN SODIUM 5 MG PO TABS: 5.0000 mg | ORAL_TABLET | Freq: Every day | ORAL | Status: DC

## 2012-07-29 NOTE — Progress Notes (Signed)
Charles [name] Date of Birth:  1941/02/28 [facility] [address] [place], [place]  [address] [phone]         Fax   [phone]  History of Present Illness: This pleasant 72 year old gentleman is seen for a work in followup office visit. He has a past history of paroxysmal atrial fibrillation and tachycardia- mediated cardiomyopathy. He is now back in sinus rhythm and his most recent echocardiogram on 10/24/11 shows his ejection fraction has returned to normal at 55-60%. On a previous office visit we stopped his amiodarone because of a tremor in his right hand. The tremor has not improved off amiodarone.  Also we have not restarted amiodarone because the patient is already hypothyroidism on Synthroid replacement.  The patient does not have any history of known ischemic heart disease.  His atrial fibrillation is paroxysmal but has been more persistent recently.  He and his wife came back from [place] early this winter so that he could receive more  Closely monitored medical attention.  In [place] his heart rate would occasionally be in the range of 160 and he developed more fluid retention which required supplemental doses of metolazone once or twice a week.   Current Outpatient Prescriptions  Medication Sig Dispense Refill  . Ascorbic Acid (VITAMIN C) 500 MG tablet Take 1,500 mg by mouth daily.        . Cholecalciferol (VITAMIN D) 2000 UNITS CAPS Take 4,000 Units by mouth daily.        . Coenzyme Q10 (COQ10 PO) Take 100 mg by mouth daily.       . dabigatran (PRADAXA) 150 MG CAPS Take 1 capsule (150 mg total) by mouth every 12 (twelve) hours.  60 capsule  11  . enalapril (VASOTEC) 20 MG tablet Take 1 tablet (20 mg total) by mouth daily.  180 tablet  3  . furosemide (LASIX) 40 MG tablet 2 daily  180 tablet  3  . levothyroxine (SYNTHROID, LEVOTHROID) 50 MCG tablet Take 1 tablet (50 mcg total) by mouth daily.  90 tablet  3  . Magnesium Citrate 100 MG TABS Take 400 mg  by mouth daily.      . metoprolol succinate (TOPROL-XL) 100 MG 24 hr tablet Take 100 mg by mouth 2 (two) times daily.       . Misc Natural Products (OSTEO BI-FLEX JOINT SHIELD PO) Take by mouth daily.        . Multiple Vitamin (MULTIVITAMIN) tablet Take 1 tablet by mouth daily.        . Omega-3 Fatty Acids (FISH OIL) 1000 MG CAPS Take by mouth 2 (two) times daily.        Marland Kitchen omeprazole (PRILOSEC) 20 MG capsule Take 1 capsule (20 mg total) by mouth daily.  90 capsule  3  . zinc gluconate 50 MG tablet Take 50 mg by mouth daily.        Marland Kitchen warfarin (COUMADIN) 5 MG tablet Take 1 tablet (5 mg total) by mouth daily.  90 tablet  3    Allergies  Allergen Reactions  . Ceftriaxone Sodium     Rocephin  . Diltiazem Hcl     Cardizem  . Penicillins     Patient Active Problem List  Diagnosis  . HYPERTENSION  . GERD  . ABDOMINAL WALL HERNIA  . OSTEOARTHROSIS NOS, UNSPECIFIED SITE  . ABRASION, LOWER EXTREMITY W/INFECTION  . NEPHROLITHIASIS, HX OF  . HIP REPLACEMENT, TOTAL, HX OF  . Atrial fibrillation  . Peripheral  edema  . Swelling  . Mitral regurgitation  . Atrial dilatation  . Orthopnea  . Dyspnea  . CHF (congestive heart failure)  . Hypertension  . Hypercholesterolemia  . Prerenal azotemia  . Tremor    History  Smoking status  . Never Smoker   Smokeless tobacco  . Never Used    History  Alcohol Use No    Family History  Problem Relation Age of Onset  . Cancer Mother 15    CANCER  . Heart disease Father 13    HEART PROBLEMS    Review of Systems: Constitutional: no fever chills diaphoresis or fatigue or change in weight.  Head and neck: no hearing loss, no epistaxis, no photophobia or visual disturbance. Respiratory: No cough, shortness of breath or wheezing. Cardiovascular: No chest pain peripheral edema, palpitations. Gastrointestinal: No abdominal distention, no abdominal pain, no change in bowel habits hematochezia or melena. Genitourinary: No dysuria, no  frequency, no urgency, no nocturia. Musculoskeletal:No arthralgias, no back pain, no gait disturbance or myalgias. Neurological: No dizziness, no headaches, no numbness, no seizures, no syncope, no weakness, no tremors. Hematologic: No lymphadenopathy, no easy bruising. Psychiatric: No confusion, no hallucinations, no sleep disturbance.    Physical Exam: Filed Vitals:   07/29/12 1624  BP: 101/72  Pulse: 97  Resp: 18   the general appearance reveals a healthy-appearing gentleman in no acute distress.The head and neck exam reveals pupils equal and reactive.  Extraocular movements are full.  There is no scleral icterus.  The mouth and pharynx are normal.  The neck is supple.  The carotids reveal no bruits.  The jugular venous pressure is normal.  The  thyroid is not enlarged.  There is no lymphadenopathy.  The chest is clear to percussion and auscultation.  There are no rales or rhonchi.  Expansion of the chest is symmetrical.  The pulse is rapid and irregular The precordium is quiet.  The first heart sound is normal.  The second heart sound is physiologically split.  There is no murmur gallop rub or click.  There is no abnormal lift or heave.  The abdomen is soft and nontender.  The bowel sounds are normal.  The liver and spleen are not enlarged.  There are no abdominal masses.  There are no abdominal bruits.  Extremities reveal good pedal pulses.  There is no phlebitis or edema.  There is no cyanosis or clubbing.  Strength is normal and symmetrical in all extremities.  There is no lateralizing weakness.  There are no sensory deficits.  The skin is warm and dry.  There is no rash.   EKG shows atrial fibrillation with rapid ventricular response and nonspecific ST-T abnormalities  Assessment / Plan: 1. start Coumadin.  Start Coumadin clinic later this week.  We will start him out with warfarin 5 mg daily for the next 3 days.  Once he is therapeutic on warfarin he will stop Pradaxa. 2. return soon  for a two-dimensional echocardiogram to assess LV function.  Consider addition of flecainide if LV function is okay. Meanwhile increase Toprol 100 mg to twice a day. There is no clear indication for continuing baby aspirin so he can stop that. Recheck for followup office visit in one month

## 2012-07-29 NOTE — Assessment & Plan Note (Signed)
His weight is stable and he does not have any rales or peripheral edema at this time

## 2012-07-29 NOTE — Assessment & Plan Note (Signed)
Today his atrial fibrillation is present and his ventricular responses 160.  He is not having any significant chest discomfort or shortness of breath with this rapid heart rate. He has decided that he does not want to pay the high cost of a newer oral anticoagulant.  He has been on Pradaxa but it is costing him too much money.  We will switch him to warfarin. In terms of rate control we will increase his metoprolol succinate up to 100 mg twice a day. We are going to have him get an updated two-dimensional echocardiogram.  If his left ventricular ejection fraction is satisfactory, we will consider adding an antiarrhythmic drug such as flecainide to his regimen

## 2012-07-29 NOTE — Patient Instructions (Addendum)
START COUMADIN 5 MG 1 DAILY, RETURN ON Thursday FOR A COUMADIN CHECK.  CONTINUE YOUR PRADAXA UNTIL THEN  STOP YOUR ASPIRIN  INCREASE YOUR TOPROL (METOPROLOL) TO 100 MG TWICE A DAY  Your physician recommends that you schedule a follow-up appointment in: 1 MONTH   Your physician has requested that you have an echocardiogram. Echocardiography is a painless test that uses sound waves to create images of your heart. It provides your doctor with information about the size and shape of your heart and how well your heart's chambers and valves are working. This procedure takes approximately one hour. There are no restrictions for this procedure.

## 2012-07-29 NOTE — Assessment & Plan Note (Signed)
The patient has a resting tremor which is mild.  It did not improve when we stopped amiodarone in the past.  It is suggestive of an essential tremor.

## 2012-07-30 ENCOUNTER — Telehealth: Payer: Self-pay | Admitting: Cardiology

## 2012-07-30 NOTE — Telephone Encounter (Signed)
Metolazone 5mg  per pt spouse call

## 2012-07-30 NOTE — Telephone Encounter (Signed)
Documented in chart.

## 2012-08-01 ENCOUNTER — Ambulatory Visit (INDEPENDENT_AMBULATORY_CARE_PROVIDER_SITE_OTHER): Payer: Medicare Other | Admitting: *Deleted

## 2012-08-01 DIAGNOSIS — I4891 Unspecified atrial fibrillation: Secondary | ICD-10-CM

## 2012-08-01 DIAGNOSIS — Z5181 Encounter for therapeutic drug level monitoring: Secondary | ICD-10-CM | POA: Insufficient documentation

## 2012-08-01 DIAGNOSIS — Z7901 Long term (current) use of anticoagulants: Secondary | ICD-10-CM

## 2012-08-01 LAB — POCT INR: INR: 1.3

## 2012-08-01 NOTE — Patient Instructions (Addendum)

## 2012-08-05 ENCOUNTER — Ambulatory Visit (INDEPENDENT_AMBULATORY_CARE_PROVIDER_SITE_OTHER): Payer: Medicare Other | Admitting: *Deleted

## 2012-08-05 DIAGNOSIS — I4891 Unspecified atrial fibrillation: Secondary | ICD-10-CM

## 2012-08-05 DIAGNOSIS — Z7901 Long term (current) use of anticoagulants: Secondary | ICD-10-CM

## 2012-08-08 ENCOUNTER — Other Ambulatory Visit (HOSPITAL_COMMUNITY): Payer: Medicare Other

## 2012-08-12 ENCOUNTER — Ambulatory Visit (INDEPENDENT_AMBULATORY_CARE_PROVIDER_SITE_OTHER): Payer: Medicare Other

## 2012-08-12 DIAGNOSIS — I4891 Unspecified atrial fibrillation: Secondary | ICD-10-CM

## 2012-08-12 DIAGNOSIS — Z7901 Long term (current) use of anticoagulants: Secondary | ICD-10-CM

## 2012-08-13 ENCOUNTER — Ambulatory Visit (HOSPITAL_COMMUNITY): Payer: Medicare Other | Attending: Cardiology | Admitting: Radiology

## 2012-08-13 DIAGNOSIS — R000 Tachycardia, unspecified: Secondary | ICD-10-CM | POA: Insufficient documentation

## 2012-08-13 DIAGNOSIS — I4891 Unspecified atrial fibrillation: Secondary | ICD-10-CM | POA: Insufficient documentation

## 2012-08-13 DIAGNOSIS — I428 Other cardiomyopathies: Secondary | ICD-10-CM

## 2012-08-13 DIAGNOSIS — I509 Heart failure, unspecified: Secondary | ICD-10-CM | POA: Insufficient documentation

## 2012-08-13 DIAGNOSIS — I10 Essential (primary) hypertension: Secondary | ICD-10-CM | POA: Insufficient documentation

## 2012-08-13 NOTE — Progress Notes (Addendum)
Echocardiogram performed. Spoke with Charles [name] concerning heart rate.  She said to do echo.

## 2012-08-14 ENCOUNTER — Telehealth: Payer: Self-pay | Admitting: Cardiology

## 2012-08-14 NOTE — Telephone Encounter (Signed)
Advised patient

## 2012-08-14 NOTE — Telephone Encounter (Signed)
New Problem:    Patient called in wanting to discuss his latest ECHO results with you.  Please call back.

## 2012-08-14 NOTE — Telephone Encounter (Signed)
Message copied by Burnell Blanks on Wed Aug 14, 2012  7:04 PM ------      Message from: [name]      Created: Tue Aug 13, 2012  8:47 PM       Please report.  The echo showed EF down to 40%.  Because of LV dysfunction we will not use flecainide. Continue present meds and get TSH and FT4 with next visit.  We will probably add amiodarone (again) at next OV and consider subsequent DCCV if he fails to convert. ------

## 2012-08-19 ENCOUNTER — Ambulatory Visit (INDEPENDENT_AMBULATORY_CARE_PROVIDER_SITE_OTHER): Payer: Medicare Other

## 2012-08-19 DIAGNOSIS — I4891 Unspecified atrial fibrillation: Secondary | ICD-10-CM

## 2012-08-19 DIAGNOSIS — Z7901 Long term (current) use of anticoagulants: Secondary | ICD-10-CM

## 2012-08-19 LAB — POCT INR: INR: 2.7

## 2012-08-26 ENCOUNTER — Ambulatory Visit (INDEPENDENT_AMBULATORY_CARE_PROVIDER_SITE_OTHER): Payer: Medicare Other | Admitting: Cardiology

## 2012-08-26 ENCOUNTER — Ambulatory Visit (INDEPENDENT_AMBULATORY_CARE_PROVIDER_SITE_OTHER): Payer: Medicare Other

## 2012-08-26 ENCOUNTER — Other Ambulatory Visit (INDEPENDENT_AMBULATORY_CARE_PROVIDER_SITE_OTHER): Payer: Medicare Other

## 2012-08-26 ENCOUNTER — Encounter: Payer: Self-pay | Admitting: Cardiology

## 2012-08-26 VITALS — BP 102/76 | HR 89 | Ht 71.0 in | Wt 200.0 lb

## 2012-08-26 DIAGNOSIS — Z7901 Long term (current) use of anticoagulants: Secondary | ICD-10-CM

## 2012-08-26 DIAGNOSIS — E039 Hypothyroidism, unspecified: Secondary | ICD-10-CM

## 2012-08-26 DIAGNOSIS — I4891 Unspecified atrial fibrillation: Secondary | ICD-10-CM

## 2012-08-26 DIAGNOSIS — I509 Heart failure, unspecified: Secondary | ICD-10-CM

## 2012-08-26 DIAGNOSIS — R0989 Other specified symptoms and signs involving the circulatory and respiratory systems: Secondary | ICD-10-CM

## 2012-08-26 LAB — BASIC METABOLIC PANEL
BUN: 44 mg/dL — ABNORMAL HIGH (ref 6–23)
CO2: 27 mEq/L (ref 19–32)
Chloride: 101 mEq/L (ref 96–112)
Creatinine, Ser: 1.8 mg/dL — ABNORMAL HIGH (ref 0.4–1.5)
Glucose, Bld: 115 mg/dL — ABNORMAL HIGH (ref 70–99)

## 2012-08-26 NOTE — Progress Notes (Signed)
Doretha Imus Date of Birth:  20-Mar-1941 [facility] [address] [place], [place]  [address] [phone]         Fax   [phone]  History of Present Illness: This pleasant 72 year old gentleman is seen for a scheduled one month followup office visit. He has a past history of paroxysmal atrial fibrillation and tachycardia- mediated cardiomyopathy.  He has had a previous direct-current cardioversion successfully with restoration of normal sinus rhythm.  He had an echocardiogram on 10/24/11 which showed an ejection fraction of 55-60%.  This was a significant improvement from his original low ejection fraction and tachycardia mediated cardiomyopathy.  Several months ago he returned back in atrial fibrillation with a rapid ventricular response.  He had an echocardiogram on 08/13/12 which showed that his ejection fraction was down to 40%.  His beta blocker dose has been increased without significant improvement in his rapid ventricular response.   Current Outpatient Prescriptions  Medication Sig Dispense Refill  . Ascorbic Acid (VITAMIN C) 500 MG tablet Take 1,500 mg by mouth daily.        . Cholecalciferol (VITAMIN D) 2000 UNITS CAPS Take 4,000 Units by mouth daily.        . Coenzyme Q10 (COQ10 PO) Take 100 mg by mouth daily.       . enalapril (VASOTEC) 20 MG tablet Take 1 tablet (20 mg total) by mouth daily.  180 tablet  3  . furosemide (LASIX) 40 MG tablet 2 daily  180 tablet  3  . levothyroxine (SYNTHROID, LEVOTHROID) 50 MCG tablet Take 1 tablet (50 mcg total) by mouth daily.  90 tablet  3  . Magnesium Citrate 100 MG TABS Take 400 mg by mouth daily.      . metolazone (ZAROXOLYN) 5 MG tablet Take 5 mg by mouth as needed.      . metoprolol succinate (TOPROL-XL) 100 MG 24 hr tablet Take 100 mg by mouth 2 (two) times daily.       . Misc Natural Products (OSTEO BI-FLEX JOINT SHIELD PO) Take by mouth daily.        . Multiple Vitamin (MULTIVITAMIN) tablet Take 1 tablet by  mouth daily.        . Omega-3 Fatty Acids (FISH OIL) 1000 MG CAPS Take by mouth 2 (two) times daily.        Marland Kitchen omeprazole (PRILOSEC) 20 MG capsule Take 1 capsule (20 mg total) by mouth daily.  90 capsule  3  . warfarin (COUMADIN) 5 MG tablet Take 1 tablet (5 mg total) by mouth daily.  90 tablet  3  . zinc gluconate 50 MG tablet Take 50 mg by mouth daily.         No current facility-administered medications for this visit.    Allergies  Allergen Reactions  . Ceftriaxone Sodium     Rocephin  . Diltiazem Hcl     Cardizem  . Penicillins     Patient Active Problem List  Diagnosis  . HYPERTENSION  . GERD  . ABDOMINAL WALL HERNIA  . OSTEOARTHROSIS NOS, UNSPECIFIED SITE  . ABRASION, LOWER EXTREMITY W/INFECTION  . NEPHROLITHIASIS, HX OF  . HIP REPLACEMENT, TOTAL, HX OF  . Atrial fibrillation  . Peripheral edema  . Swelling  . Mitral regurgitation  . Orthopnea  . Dyspnea  . CHF (congestive heart failure)  . Hypertension  . Hypercholesterolemia  . Prerenal azotemia  . Tremor  . Long term (current) use of anticoagulants    History  Smoking status  . Never Smoker   Smokeless tobacco  . Never Used    History  Alcohol Use No    Family History  Problem Relation Age of Onset  . Cancer Mother 86    CANCER  . Heart disease Father 52    HEART PROBLEMS    Review of Systems: Constitutional: no fever chills diaphoresis or fatigue or change in weight.  Head and neck: no hearing loss, no epistaxis, no photophobia or visual disturbance. Respiratory: No cough, shortness of breath or wheezing. Cardiovascular: No chest pain peripheral edema, palpitations. Gastrointestinal: No abdominal distention, no abdominal pain, no change in bowel habits hematochezia or melena. Genitourinary: No dysuria, no frequency, no urgency, no nocturia. Musculoskeletal:No arthralgias, no back pain, no gait disturbance or myalgias. Neurological: No dizziness, no headaches, no numbness, no seizures, no  syncope, no weakness, no tremors. Hematologic: No lymphadenopathy, no easy bruising. Psychiatric: No confusion, no hallucinations, no sleep disturbance.    Physical Exam: Filed Vitals:   08/26/12 0935  BP: 102/76  Pulse: 89   the general appearance reveals a well-developed well-nourished gentleman in no distress.  His pulse is rapid and irregular.  We did not do an EKG today.The head and neck exam reveals pupils equal and reactive.  Extraocular movements are full.  There is no scleral icterus.  The mouth and pharynx are normal.  The neck is supple.  The carotids reveal no bruits.  The jugular venous pressure is normal.  The  thyroid is not enlarged.  There is no lymphadenopathy.  The chest is clear to percussion and auscultation.  There are no rales or rhonchi.  Expansion of the chest is symmetrical.  The precordium is quiet.  The first heart sound is normal.  The second heart sound is physiologically split.  There is no murmur gallop rub or click.  There is no abnormal lift or heave.  The abdomen is soft and nontender.  The bowel sounds are normal.  The liver and spleen are not enlarged.  There are no abdominal masses.  There are no abdominal bruits.  Extremities reveal good pedal pulses.  There is trace pretibial edema. There is no cyanosis or clubbing.  Strength is normal and symmetrical in all extremities.  There is no lateralizing weakness.  There are no sensory deficits.  The skin is warm and dry.  There is no rash.     Assessment / Plan: We are checking electrolytes and thyroid functions today.  We will consider possible direct-current cardioversion on Wednesday, March 12.  We will consider adding low-dose amiodarone back to his regimen after we see how his thyroid function tests are.  Lab work and [name] pending today. At this point we will set him up for a followup office visit and EKG in about one month.

## 2012-08-26 NOTE — Assessment & Plan Note (Signed)
The patient is clinically euthyroid except for his fast heart rate.  We are checking thyroid levels today.  He is on Synthroid 50 mcg daily.

## 2012-08-26 NOTE — Assessment & Plan Note (Signed)
The patient has been experiencing some exertional dyspnea.  He is not having orthopnea or paroxysmal nocturnal dyspnea.  He does not have much energy and he tires easily because of his fast heart rate.  His last chest x-ray was 10/24/11 and showed mild cardiomegaly at that time but no CHF.  He also has known pleural plaques by previous CT scan of the chest.

## 2012-08-26 NOTE — Patient Instructions (Signed)
Your physician recommends that you return for lab work in: today bmet tsh  Your physician recommends that you schedule a follow-up appointment in: 1 month with ekg

## 2012-08-26 NOTE — Progress Notes (Signed)
Quick Note:  Please report to patient. The recent labs are stable. Continue same medication and careful diet. Potassium is okay. The kidneys are a little drier because of the diuretic that we need to use.  He also was to have gotten a TSH and free T4. Did he get those? We need a baseline before resuming amiodarone. ______

## 2012-08-26 NOTE — Assessment & Plan Note (Signed)
The patient remains in atrial fibrillation with a rapid ventricular response.  He presently is on metoprolol 100 mg twice a day.  We did not start flecainide because of his depressed ejection fraction.  We are checking thyroid function tests as a baseline today and then we will consider restarting low-dose amiodarone and following thyroid function closely.

## 2012-08-26 NOTE — Assessment & Plan Note (Signed)
The patient has been doing well on his Coumadin.  [name] today is pending

## 2012-08-27 ENCOUNTER — Ambulatory Visit: Payer: Medicare Other

## 2012-08-27 DIAGNOSIS — I4891 Unspecified atrial fibrillation: Secondary | ICD-10-CM

## 2012-08-28 ENCOUNTER — Encounter: Payer: Self-pay | Admitting: *Deleted

## 2012-08-28 ENCOUNTER — Telehealth: Payer: Self-pay | Admitting: *Deleted

## 2012-08-28 DIAGNOSIS — Z01810 Encounter for preprocedural cardiovascular examination: Secondary | ICD-10-CM

## 2012-08-28 DIAGNOSIS — I4891 Unspecified atrial fibrillation: Secondary | ICD-10-CM

## 2012-08-28 MED ORDER — AMIODARONE HCL 200 MG PO TABS: 200.0000 mg | ORAL_TABLET | Freq: Two times a day (BID) | ORAL | Status: DC

## 2012-08-28 NOTE — Telephone Encounter (Signed)
Cardioversion scheduled 09/04/12 @9 :30 arrive 8am. Labs 3/10, pt aware and will pick up letter on 3/10

## 2012-08-28 NOTE — Progress Notes (Signed)
Quick Note:  Please report to patient. The recent labs are stable. Continue same medication and careful diet. Thyroid function is normal. Already reported by Jodette. We have started him on amiodarone 200 mg BID leading up to DCCV next week. ______

## 2012-08-30 ENCOUNTER — Other Ambulatory Visit: Payer: Self-pay | Admitting: Cardiology

## 2012-08-30 DIAGNOSIS — I4891 Unspecified atrial fibrillation: Secondary | ICD-10-CM

## 2012-09-02 ENCOUNTER — Ambulatory Visit (INDEPENDENT_AMBULATORY_CARE_PROVIDER_SITE_OTHER): Payer: Medicare Other | Admitting: *Deleted

## 2012-09-02 ENCOUNTER — Other Ambulatory Visit (INDEPENDENT_AMBULATORY_CARE_PROVIDER_SITE_OTHER): Payer: Medicare Other

## 2012-09-02 DIAGNOSIS — Z01810 Encounter for preprocedural cardiovascular examination: Secondary | ICD-10-CM

## 2012-09-02 DIAGNOSIS — I4891 Unspecified atrial fibrillation: Secondary | ICD-10-CM

## 2012-09-02 DIAGNOSIS — Z7901 Long term (current) use of anticoagulants: Secondary | ICD-10-CM

## 2012-09-02 LAB — PROTIME-INR
INR: 2.8 ratio — ABNORMAL HIGH (ref 0.8–1.0)
Prothrombin Time: 29.2 s — ABNORMAL HIGH (ref 10.2–12.4)

## 2012-09-02 LAB — BASIC METABOLIC PANEL
CO2: 31 mEq/L (ref 19–32)
Calcium: 9.7 mg/dL (ref 8.4–10.5)
Chloride: 101 mEq/L (ref 96–112)
Creatinine, Ser: 1.8 mg/dL — ABNORMAL HIGH (ref 0.4–1.5)
Glucose, Bld: 107 mg/dL — ABNORMAL HIGH (ref 70–99)

## 2012-09-02 LAB — CBC
HCT: 40.2 % (ref 39.0–52.0)
MCHC: 33.2 g/dL (ref 30.0–36.0)
MCV: 89.7 fl (ref 78.0–100.0)
RBC: 4.48 Mil/uL (ref 4.22–5.81)

## 2012-09-02 LAB — POCT INR: INR: 2.6

## 2012-09-03 ENCOUNTER — Telehealth: Payer: Self-pay | Admitting: *Deleted

## 2012-09-03 NOTE — Telephone Encounter (Signed)
Message copied by Burnell Blanks on Tue Sep 03, 2012  9:29 AM ------      Message from: [name]      Created: Mon Sep 02, 2012  8:45 PM       Labs satisfactory for DCCV Wednesday. Please report. ------

## 2012-09-03 NOTE — Telephone Encounter (Signed)
Advised patient of lab results  

## 2012-09-04 ENCOUNTER — Encounter (HOSPITAL_COMMUNITY): Payer: Self-pay | Admitting: *Deleted

## 2012-09-04 ENCOUNTER — Ambulatory Visit (INDEPENDENT_AMBULATORY_CARE_PROVIDER_SITE_OTHER): Payer: Medicare Other | Admitting: *Deleted

## 2012-09-04 ENCOUNTER — Ambulatory Visit (HOSPITAL_COMMUNITY)
Admission: RE | Admit: 2012-09-04 | Discharge: 2012-09-04 | Disposition: A | Discharge status: Home / Self Care | Payer: Medicare Other | Source: Ambulatory Visit | Attending: Cardiology | Admitting: Cardiology

## 2012-09-04 ENCOUNTER — Encounter (HOSPITAL_COMMUNITY): Payer: Self-pay | Admitting: Gastroenterology

## 2012-09-04 ENCOUNTER — Encounter (HOSPITAL_COMMUNITY)
Admission: RE | Disposition: A | Discharge status: Home / Self Care | Payer: Self-pay | Source: Ambulatory Visit | Attending: Cardiology

## 2012-09-04 ENCOUNTER — Ambulatory Visit (HOSPITAL_COMMUNITY): Payer: Medicare Other | Admitting: *Deleted

## 2012-09-04 DIAGNOSIS — I4891 Unspecified atrial fibrillation: Secondary | ICD-10-CM | POA: Insufficient documentation

## 2012-09-04 DIAGNOSIS — Z7901 Long term (current) use of anticoagulants: Secondary | ICD-10-CM

## 2012-09-04 HISTORY — PX: CARDIOVERSION: SHX1299

## 2012-09-04 HISTORY — DX: Personal history of other diseases of the digestive system: Z87.19

## 2012-09-04 HISTORY — DX: Unspecified osteoarthritis, unspecified site: M19.90

## 2012-09-04 HISTORY — DX: Gastro-esophageal reflux disease without esophagitis: K21.9

## 2012-09-04 SURGERY — CARDIOVERSION
Anesthesia: General

## 2012-09-04 MED ORDER — DEXTROSE-NACL 5-0.45 % IV SOLN: INTRAVENOUS | Status: DC

## 2012-09-04 MED ORDER — SODIUM CHLORIDE 0.9 % IV SOLN
INTRAVENOUS | Status: DC
  Administered 2012-09-04: 500 mL via INTRAVENOUS

## 2012-09-04 MED ORDER — PROPOFOL 10 MG/ML IV BOLUS
INTRAVENOUS | Status: DC | PRN
  Administered 2012-09-04: 15 mg via INTRAVENOUS
  Administered 2012-09-04: 45 mg via INTRAVENOUS

## 2012-09-04 MED ORDER — SODIUM CHLORIDE 0.9 % IV SOLN
INTRAVENOUS | Status: DC | PRN
  Administered 2012-09-04: 09:00:00 via INTRAVENOUS

## 2012-09-04 MED ORDER — LIDOCAINE HCL (CARDIAC) 20 MG/ML IV SOLN
INTRAVENOUS | Status: DC | PRN
  Administered 2012-09-04: 30 mg via INTRAVENOUS

## 2012-09-04 NOTE — CV Procedure (Signed)
Electrical Cardioversion Procedure Note Charles [name] [id number] Feb 23, 1941  Procedure: Electrical Cardioversion Indications:  Atrial Fibrillation  Procedure Details Consent: Risks of procedure as well as the alternatives and risks of each were explained to the (patient/caregiver).  Consent for procedure obtained. Time Out: Verified patient identification, verified procedure, site/side was marked, verified correct patient position, special equipment/implants available, medications/allergies/relevent history reviewed, required imaging and test results available.  Performed  Patient placed on cardiac monitor, pulse oximetry, supplemental oxygen as necessary.  Sedation given: propofol Pacer pads placed anterior and posterior chest.  Cardioverted 1 time(s).  Cardioverted at 120J.  Evaluation Findings: Post procedure EKG shows: NSR Complications: None Patient did tolerate procedure well.   Charles [name] 09/04/2012, 9:31 AM

## 2012-09-04 NOTE — Preoperative (Signed)
Beta Blockers   Reason not to administer Beta Blockers:Not Applicable 

## 2012-09-04 NOTE — Transfer of Care (Signed)
Immediate Anesthesia Transfer of Care Note  Patient: Charles [name]  Procedure(s) Performed: Procedure(s): CARDIOVERSION (N/A)  Patient Location: PACU and Endoscopy Unit  Anesthesia Type:General  Level of Consciousness: awake, oriented, pateint uncooperative and responds to stimulation  Airway & Oxygen Therapy: Patient Spontanous Breathing and Patient connected to nasal cannula oxygen  Post-op Assessment: Report given to PACU RN and Post -op Vital signs reviewed and stable  Post vital signs: Reviewed and stable  Complications: No apparent anesthesia complications

## 2012-09-04 NOTE — H&P (Signed)
History and physical reviewed .  No change from office visit 08/26/12. Pre-op labs reviewed and are stable.

## 2012-09-04 NOTE — Anesthesia Preprocedure Evaluation (Addendum)
Anesthesia Evaluation  Patient identified by MRN, date of birth, ID band Patient awake    Reviewed: Allergy & Precautions, H&P , NPO status , Patient's Chart, lab work & pertinent test results, reviewed documented beta blocker date and time   Airway Mallampati: II TM Distance: >3 FB Neck ROM: Full    Dental  (+) Teeth Intact and Dental Advisory Given   Pulmonary shortness of breath,  breath sounds clear to auscultation        Cardiovascular hypertension, Pt. on medications and Pt. on home beta blockers +CHF and + Orthopnea + dysrhythmias Atrial Fibrillation Rhythm:Irregular Rate:Tachycardia     Neuro/Psych    GI/Hepatic Neg liver ROS, hiatal hernia, GERD-  Medicated and Controlled,  Endo/Other  Hypothyroidism   Renal/GU      Musculoskeletal  (+) Arthritis -,   Abdominal Normal abdominal exam  (+)   Peds  Hematology negative hematology ROS (+)   Anesthesia Other Findings   Reproductive/Obstetrics                           Anesthesia Physical Anesthesia Plan  ASA: III  Anesthesia Plan: General   Post-op Pain Management:    Induction: Intravenous  Airway Management Planned: Mask  Additional Equipment:   Intra-op Plan:   Post-operative Plan:   Informed Consent: I have reviewed the patients History and Physical, chart, labs and discussed the procedure including the risks, benefits and alternatives for the proposed anesthesia with the patient or authorized representative who has indicated his/her understanding and acceptance.   Dental advisory given  Plan Discussed with: Surgeon, Anesthesiologist and CRNA  Anesthesia Plan Comments:        Anesthesia Quick Evaluation

## 2012-09-04 NOTE — Anesthesia Postprocedure Evaluation (Signed)
  Anesthesia Post-op Note  Patient: Charles [name]  Procedure(s) Performed: Procedure(s): CARDIOVERSION (N/A)  Patient Location: PACU and Endoscopy Unit  Anesthesia Type:General  Level of Consciousness: awake, oriented, patient cooperative and responds to stimulation  Airway and Oxygen Therapy: Patient Spontanous Breathing and Patient connected to nasal cannula oxygen  Post-op Pain: none  Post-op Assessment: Post-op Vital signs reviewed, Patient's Cardiovascular Status Stable and Respiratory Function Stable  Post-op Vital Signs: Reviewed and stable  Complications: No apparent anesthesia complications

## 2012-09-05 ENCOUNTER — Telehealth: Payer: Self-pay | Admitting: *Deleted

## 2012-09-05 ENCOUNTER — Encounter (HOSPITAL_COMMUNITY): Payer: Self-pay | Admitting: Cardiology

## 2012-09-05 NOTE — Telephone Encounter (Signed)
Decrease Toprol 100 mg once a day and decrease furosemide to 40 mg once a day

## 2012-09-05 NOTE — Telephone Encounter (Signed)
Advised patient, he will continue to monitor and let us know if no improvement

## 2012-09-05 NOTE — Telephone Encounter (Signed)
Patient phone with blood pressure and heart rate for today:  6:30 AM   BP 114/74  HR 59   8:30 AM   BP 90/52    HR 51 FELT TIRED  10:15 AM BP 80/48    HR 51  Will forward to  Dr. [name] for review

## 2012-09-16 ENCOUNTER — Telehealth: Payer: Self-pay | Admitting: *Deleted

## 2012-09-16 ENCOUNTER — Ambulatory Visit (INDEPENDENT_AMBULATORY_CARE_PROVIDER_SITE_OTHER): Payer: Medicare Other | Admitting: *Deleted

## 2012-09-16 DIAGNOSIS — I4891 Unspecified atrial fibrillation: Secondary | ICD-10-CM

## 2012-09-16 DIAGNOSIS — Z7901 Long term (current) use of anticoagulants: Secondary | ICD-10-CM

## 2012-09-16 LAB — POCT INR: INR: 2.2

## 2012-09-16 NOTE — Telephone Encounter (Signed)
Patient in for coumadin check today. His weight had been up 15 pounds since changing lasix to once a day (was out of town and didn't weigh himself until home Friday). He started back on Lasix 2 a day and took one Zaroxolyn and weight back down to normal. Also patient went back into A fib on Friday evening around 8 pm. Patient heart rate today was 108 per [facility] LPN in coumadin clinic. Patient stated he felt good. He was getting ready to go out of town again. Discussed with Lawson Fiscal NP and will keep medications as is and keep appointment with  Dr. [name] next week.

## 2012-09-16 NOTE — Telephone Encounter (Signed)
Agree with plans

## 2012-09-26 ENCOUNTER — Ambulatory Visit (INDEPENDENT_AMBULATORY_CARE_PROVIDER_SITE_OTHER): Payer: Medicare Other | Admitting: Cardiology

## 2012-09-26 ENCOUNTER — Encounter: Payer: Self-pay | Admitting: Cardiology

## 2012-09-26 ENCOUNTER — Ambulatory Visit: Payer: Medicare Other | Admitting: Cardiology

## 2012-09-26 VITALS — BP 124/78 | HR 136 | Ht 71.0 in | Wt 193.1 lb

## 2012-09-26 DIAGNOSIS — I509 Heart failure, unspecified: Secondary | ICD-10-CM

## 2012-09-26 DIAGNOSIS — I4891 Unspecified atrial fibrillation: Secondary | ICD-10-CM

## 2012-09-26 MED ORDER — METOPROLOL SUCCINATE ER 100 MG PO TB24: 100.0000 mg | ORAL_TABLET | ORAL | Status: DC

## 2012-09-26 MED ORDER — ENALAPRIL MALEATE 20 MG PO TABS: 20.0000 mg | ORAL_TABLET | Freq: Every day | ORAL | Status: DC

## 2012-09-26 MED ORDER — FUROSEMIDE 40 MG PO TABS: 80.0000 mg | ORAL_TABLET | Freq: Every day | ORAL | Status: DC

## 2012-09-26 NOTE — Assessment & Plan Note (Signed)
EKG today again shows atrial fibrillation with rapid ventricular response of 136.  The patient is not having any symptoms from his atrial fibrillation at this time but we talked about how he sustained rapid rates would lead to deterioration of his left ventricular function again because of tachycardia mediated cardiomyopathy.  We will try to slow his ventricular rate by increasing his Toprol to 100 mg in the morning and 50 mg in the evening.  If pulse remains fast he will go to 100 mg twice a day.  He has failed cardioversion twice, this time with amiodarone on board prior to cardioversion.  We talked about EP evaluation and possible ablation and he would like to pursue that route.  He has an acquaintance who has had successful ablation.  We will refer to Dr. [name].  I am stopping his amiodarone at this point since he is already hypothyroid and the long-term amiodarone will complicate that treatment.

## 2012-09-26 NOTE — Patient Instructions (Signed)
INCREASE YOUR TOPROL (METOPROLOL) TO 100 MG IN THE MORNING AND 50 MG IN THE EVENING  STOP YOUR AMIODARONE   ADD MUCINEX (PLAIN) 600 MG TWICE A DAY AS NEEDED  Will have you come back soon for an appointment with [name]  Your physician recommends that you schedule a follow-up appointment in: 6 week ov/ekg

## 2012-09-26 NOTE — Progress Notes (Signed)
Doretha Imus Date of Birth:  21-Aug-1940 [facility] [address] [place], [place]  [address] [phone]         Fax   [phone]  History of Present Illness: This pleasant 72 year old gentleman is seen for a scheduled one month followup office visit. He has a past history of paroxysmal atrial fibrillation and tachycardia- mediated cardiomyopathy.  He initially presented several years ago with signs and symptoms of congestive heart failure and was found to be in atrial fibrillation with rapid ventricular response and he had a low ejection fraction.  He was cardioverted and showed significant clinical improvement and his echocardiogram on 10/24/11 while in normal sinus rhythm showed an ejection fraction of 55-60% . This was a significant improvement from his original low ejection fraction and tachycardia mediated cardiomyopathy.  He did well over the course of the summer of 2013. Several months ago he returned back in atrial fibrillation with a rapid ventricular response. He had an echocardiogram on 08/13/12 which showed that his ejection fraction was down to 40%. His beta blocker dose has been increased without significant improvement in his rapid ventricular response.  He underwent direct current cardioversion for the second time on 09/04/12 and converted with one shock to normal sinus rhythm.  However prior to returning to the office he noted that he was back in atrial fibrillation on the evening of 09/13/12.  The patient states that he feels well.  He denies any chest pain or shortness of breath.  Following his cardioversion he went on a driving trip to [place] and while there developed worsening peripheral edema which responded to doubling his Lasix from 40 mg a day up to 80 mg daily.  The patient does have a history of mild renal insufficiency which becomes worse when we have to treat him with high dose diuretics.   Current Outpatient Prescriptions  Medication Sig  Dispense Refill  . Ascorbic Acid (VITAMIN C) 500 MG tablet Take 1,500 mg by mouth daily.        . Cholecalciferol (VITAMIN D) 2000 UNITS CAPS Take 4,000 Units by mouth daily.        . Coenzyme Q10 (COQ10 PO) Take 100 mg by mouth daily.       . enalapril (VASOTEC) 20 MG tablet Take 1 tablet (20 mg total) by mouth daily.  180 tablet  3  . furosemide (LASIX) 40 MG tablet Take 2 tablets (80 mg total) by mouth daily.  180 tablet  3  . guaiFENesin (MUCINEX) 600 MG 12 hr tablet Take 600 mg by mouth 2 (two) times daily as needed for congestion.      Marland Kitchen levothyroxine (SYNTHROID, LEVOTHROID) 50 MCG tablet Take 1 tablet (50 mcg total) by mouth daily.  90 tablet  3  . Magnesium Citrate 100 MG TABS Take 400 mg by mouth daily.      . metolazone (ZAROXOLYN) 5 MG tablet Take 5 mg by mouth as needed.      . metoprolol succinate (TOPROL-XL) 100 MG 24 hr tablet Take 1 tablet (100 mg total) by mouth as directed. TAKE 100 MG IN THE MORNING AND 50 MG IN THE EVENING  225 tablet  3  . Misc Natural Products (OSTEO BI-FLEX JOINT SHIELD PO) Take by mouth daily.        . Multiple Vitamin (MULTIVITAMIN) tablet Take 1 tablet by mouth daily.        . Omega-3 Fatty Acids (FISH OIL) 1000 MG CAPS Take by mouth 2 (  two) times daily.        Marland Kitchen omeprazole (PRILOSEC) 20 MG capsule Take 1 capsule (20 mg total) by mouth daily.  90 capsule  3  . warfarin (COUMADIN) 5 MG tablet Take 1 tablet (5 mg total) by mouth daily.  90 tablet  3  . zinc gluconate 50 MG tablet Take 50 mg by mouth daily.         No current facility-administered medications for this visit.    Allergies  Allergen Reactions  . Ceftriaxone Sodium     Rocephin  . Diltiazem Hcl     Cardizem  . Penicillins     Patient Active Problem List  Diagnosis  . HYPERTENSION  . GERD  . ABDOMINAL WALL HERNIA  . OSTEOARTHROSIS NOS, UNSPECIFIED SITE  . ABRASION, LOWER EXTREMITY W/INFECTION  . NEPHROLITHIASIS, HX OF  . HIP REPLACEMENT, TOTAL, HX OF  . Atrial fibrillation    . Peripheral edema  . Swelling  . Mitral regurgitation  . Orthopnea  . Dyspnea  . CHF (congestive heart failure)  . Hypertension  . Hypercholesterolemia  . Prerenal azotemia  . Tremor  . Long term (current) use of anticoagulants  . Hypothyroidism    History  Smoking status  . Never Smoker   Smokeless tobacco  . Never Used    History  Alcohol Use No    Family History  Problem Relation Age of Onset  . Cancer Mother 22    CANCER  . Heart disease Father 2    HEART PROBLEMS    Review of Systems: Constitutional: no fever chills diaphoresis or fatigue or change in weight.  Head and neck: no hearing loss, no epistaxis, no photophobia or visual disturbance. Respiratory: No cough, shortness of breath or wheezing. Cardiovascular: No chest pain peripheral edema, palpitations. Gastrointestinal: No abdominal distention, no abdominal pain, no change in bowel habits hematochezia or melena. Genitourinary: No dysuria, no frequency, no urgency, no nocturia. Musculoskeletal:No arthralgias, no back pain, no gait disturbance or myalgias. Neurological: No dizziness, no headaches, no numbness, no seizures, no syncope, no weakness, no tremors. Hematologic: No lymphadenopathy, no easy bruising. Psychiatric: No confusion, no hallucinations, no sleep disturbance.    Physical Exam: Filed Vitals:   09/26/12 1340  BP: 124/78  Pulse: 136   the general appearance reveals a well-developed well-nourished gentleman in no distress.  He has a slight head cold today with congestion.The head and neck exam reveals pupils equal and reactive.  Extraocular movements are full.  There is no scleral icterus.  The mouth and pharynx are normal.  The neck is supple.  The carotids reveal no bruits.  The jugular venous pressure is normal.  The  thyroid is not enlarged.  There is no lymphadenopathy.  The chest is clear to percussion and auscultation.  There are no rales or rhonchi.  Expansion of the chest is  symmetrical.  The pulse is rapid and irregular. The precordium is quiet.  The first heart sound is normal.  The second heart sound is physiologically split.  There is no murmur gallop rub or click.  There is no abnormal lift or heave.  The abdomen is soft and nontender.  The bowel sounds are normal.  The liver and spleen are not enlarged.  There are no abdominal masses.  There are no abdominal bruits.  Extremities reveal good pedal pulses.  There is no phlebitis or edema.  There is no cyanosis or clubbing.  Strength is normal and symmetrical in all extremities.  There  is no lateralizing weakness.  There are no sensory deficits.  The skin is warm and dry.  There is no rash.   EKG shows atrial fibrillation with rapid ventricular response of 136 and nonspecific ST-T wave changes.  Assessment / Plan: Continue same medication except stop amiodarone now and increase Toprol XL 100 mg in the morning and 50 mg in the evening.  For his upper respiratory infection he will use Mucinex plain He has an appointment to see Dr. [name] on May 16.  I will see the patient back in 2 months for office visit and EKG

## 2012-09-26 NOTE — Assessment & Plan Note (Signed)
The patient's symptoms of CHF have resolved with increasing his Lasix to 80 mg daily.

## 2012-09-30 ENCOUNTER — Telehealth: Payer: Self-pay | Admitting: Cardiology

## 2012-09-30 NOTE — Telephone Encounter (Signed)
Scheduled ov for 5/29, ok per  Dr. [name]

## 2012-09-30 NOTE — Telephone Encounter (Signed)
New Prob   Pt calling in returning phone call. Please call.

## 2012-10-07 ENCOUNTER — Ambulatory Visit (INDEPENDENT_AMBULATORY_CARE_PROVIDER_SITE_OTHER): Payer: Medicare Other

## 2012-10-07 DIAGNOSIS — I4891 Unspecified atrial fibrillation: Secondary | ICD-10-CM

## 2012-10-07 DIAGNOSIS — Z7901 Long term (current) use of anticoagulants: Secondary | ICD-10-CM

## 2012-10-28 ENCOUNTER — Ambulatory Visit (INDEPENDENT_AMBULATORY_CARE_PROVIDER_SITE_OTHER): Payer: Medicare Other | Admitting: Pharmacist

## 2012-10-28 DIAGNOSIS — Z7901 Long term (current) use of anticoagulants: Secondary | ICD-10-CM

## 2012-10-28 DIAGNOSIS — I4891 Unspecified atrial fibrillation: Secondary | ICD-10-CM

## 2012-11-07 ENCOUNTER — Ambulatory Visit: Payer: Medicare Other | Admitting: Cardiology

## 2012-11-08 ENCOUNTER — Ambulatory Visit (INDEPENDENT_AMBULATORY_CARE_PROVIDER_SITE_OTHER): Payer: Medicare Other | Admitting: Internal Medicine

## 2012-11-08 ENCOUNTER — Encounter: Payer: Self-pay | Admitting: Internal Medicine

## 2012-11-08 VITALS — BP 108/82 | HR 124 | Ht 71.0 in | Wt 197.8 lb

## 2012-11-08 DIAGNOSIS — I428 Other cardiomyopathies: Secondary | ICD-10-CM

## 2012-11-08 DIAGNOSIS — I4891 Unspecified atrial fibrillation: Secondary | ICD-10-CM

## 2012-11-08 DIAGNOSIS — R0989 Other specified symptoms and signs involving the circulatory and respiratory systems: Secondary | ICD-10-CM

## 2012-11-08 DIAGNOSIS — R0683 Snoring: Secondary | ICD-10-CM

## 2012-11-08 DIAGNOSIS — Z7901 Long term (current) use of anticoagulants: Secondary | ICD-10-CM

## 2012-11-08 NOTE — Patient Instructions (Addendum)
Your physician has recommended that you have a sleep study. This test records several body functions during sleep, including: brain activity, eye movement, oxygen and carbon dioxide blood levels, heart rate and rhythm, breathing rate and rhythm, the flow of air through your mouth and nose, snoring, body muscle movements, and chest and belly movement.     Your physician has recommended that you have an ablation. Catheter ablation is a medical procedure used to treat some cardiac arrhythmias (irregular heartbeats). During catheter ablation, a long, thin, flexible tube is put into a blood vessel in your groin (upper thigh), or neck. This tube is called an ablation catheter. It is then guided to your heart through the blood vessel. Radio frequency waves destroy small areas of heart tissue where abnormal heartbeats may cause an arrhythmia to start. Please see the instruction sheet given to you today.

## 2012-11-08 NOTE — Progress Notes (Signed)
Primary Care Physician: [name], MD Referring Physician:  [name] is a 72 y.o. male with a h/o persistent atrial fibrillation and tachycardia mediated cardiomyopathy who presents today for EP consultation.  He reports initially being diagnosed with atrial fibrillation 2007 after presenting for hip surgery and being found to have afib on the monitor.  He spontaneously converted to sinus rhythm.  He did well until 2010 when he presented for routine physical exam and was again found to have afib.  He was asymptomatic both times.  He reports that he spontaneously converted to sinus rhythm.  In 2012, he presented with symptoms of CHF and was found to have afib.  He underwent cardioversion on August 30, 2010.  He thinks that he stayed in sinus rhythm for a while.  12/13, he presented for routine physical exam and was again found to have afib.  He spontaneously returned to sinus rhythm about 10 days later.  06/22/12, he again developed afib.  He had persistence of afib and required cardioversion 3/14.  He reports that 9 days later he returned to afib.  He is appropriatley anticoagulated with coumadin.  He was placed on amiodarone 12/13 but had recurrence of afib on this medicine.  He has had some thyroid dysfunction with amiodarone as well.   Presently, he reports fatigue and decreased exercise tolerance with his afib.  He reports getting tired with 1 flight of stairs or walking a block.  + SOB with exertion and mild SOB.  Today, he denies symptoms of palpitations, chest pain,  orthopnea, PND, dizziness, presyncope, syncope, or neurologic sequela. + snoring with apnea observed by his spouse.  The patient is tolerating medications without difficulties and is otherwise without complaint today.   Past Medical History  Diagnosis Date  . Persistent atrial fibrillation   . Peripheral edema   . Mitral regurgitation     MILD  . Atrial dilatation     mild biatrial dilitation  .  Nonischemic cardiomyopathy     presumed to be tachycardia mediated  . Hypertension   . Hypercholesterolemia   . Chronic kidney disease     baseline creatinine is 1.8  . GERD (gastroesophageal reflux disease)   . H/O hiatal hernia   . Arthritis    Past Surgical History  Procedure Laterality Date  . Lithotripsy      x 2  . Joint replacement  2005    total right hip replacement  . Hernia repair  03/2005  . Kidney stent  1987, 02/2005  . Quadrec      right leg quad sx  . Cardioversion N/A 09/04/2012    Procedure: CARDIOVERSION;  Surgeon: [name], MD;  Location: [facility] ENDOSCOPY;  Service: Cardiovascular;  Laterality: N/A;    Current Outpatient Prescriptions  Medication Sig Dispense Refill  . Ascorbic Acid (VITAMIN C) 500 MG tablet Take 1,500 mg by mouth daily.        . Cholecalciferol (VITAMIN D) 2000 UNITS CAPS Take 4,000 Units by mouth daily.        . Coenzyme Q10 (COQ10 PO) Take 100 mg by mouth daily.       . enalapril (VASOTEC) 20 MG tablet Take 1 tablet (20 mg total) by mouth daily.  180 tablet  3  . furosemide (LASIX) 40 MG tablet Take 2 tablets (80 mg total) by mouth daily.  180 tablet  3  . guaiFENesin (MUCINEX) 600 MG 12 hr tablet Take 600 mg by mouth 2 (two)  times daily as needed for congestion.      Marland Kitchen levothyroxine (SYNTHROID, LEVOTHROID) 50 MCG tablet Take 1 tablet (50 mcg total) by mouth daily.  90 tablet  3  . Magnesium Citrate 100 MG TABS Take 400 mg by mouth daily.      . metolazone (ZAROXOLYN) 5 MG tablet Take 5 mg by mouth as needed.      . metoprolol succinate (TOPROL-XL) 100 MG 24 hr tablet Take 1 tablet (100 mg total) by mouth as directed. TAKE 100 MG IN THE MORNING AND 50 MG IN THE EVENING  225 tablet  3  . Misc Natural Products (OSTEO BI-FLEX JOINT SHIELD PO) Take by mouth daily.        . Multiple Vitamin (MULTIVITAMIN) tablet Take 1 tablet by mouth daily.        . Omega-3 Fatty Acids (FISH OIL) 1000 MG CAPS Take by mouth 2 (two) times daily.        Marland Kitchen  omeprazole (PRILOSEC) 20 MG capsule Take 20 mg by mouth daily as needed.      . warfarin (COUMADIN) 5 MG tablet Take 1 tablet (5 mg total) by mouth daily.  90 tablet  3  . zinc gluconate 50 MG tablet Take 50 mg by mouth daily.         No current facility-administered medications for this visit.    Allergies  Allergen Reactions  . Ceftriaxone Sodium     Rocephin  . Diltiazem Hcl     Cardizem  . Penicillins     History   Social History  . Marital Status: Married    Spouse Name: N/A    Number of Children: N/A  . Years of Education: N/A   Occupational History  . Not on file.   Social History Main Topics  . Smoking status: Never Smoker   . Smokeless tobacco: Never Used  . Alcohol Use: No  . Drug Use: No  . Sexually Active: Not on file   Other Topics Concern  . Not on file   Social History Narrative   Lives in [place] with spouse.  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   [employer]    Family History  Problem Relation Age of Onset  . Cancer Mother 66    CANCER  . Heart disease Father 38    HEART PROBLEMS    ROS- All systems are reviewed and negative except as per the HPI above  Physical Exam: Filed Vitals:   11/08/12 1411  BP: 108/82  Pulse: 124  Height: 5\' 11"  (1.803 m)  Weight: 197 lb 12.8 oz (89.721 kg)    GEN- The patient is well appearing, alert and oriented x 3 today.   Head- normocephalic, atraumatic Eyes-  Sclera clear, conjunctiva pink Ears- hearing intact Oropharynx- clear Neck- supple, no JVP Lymph- no cervical lymphadenopathy Lungs- Clear to ausculation bilaterally, normal work of breathing Heart- tachycardic irregular rhythm, no murmurs, rubs or gallops, PMI not laterally displaced GI- soft, NT, ND, + BS Extremities- no clubbing, cyanosis, or edema MS- no significant deformity or atrophy Skin- no rash or lesion Psych- euthymic mood, full affect Neuro- strength and sensation are intact  EKG today  reveals afib with V rate 124 bpm, nonspecific ST/T changes Echo 08/13/12- LVEF 40%, mild to moderate biatrial enlargement, LA size 45 mm Dr Jenness [place] recorders are reviewed in detail  Assessment and Plan:  1. Persistent atrial fibrillation The patient has symptomatic persistent atrial fibrillation.  V rates have been very difficult to control, leading to a tachycardia induced cardiomyopathy.  I think that he would clearly do better in sinus rhythm long term, though I am sure that our ability to maintain sinus rhythm is reduced.  He has failed medical therapy with amiodarone. Therapeutic strategies for afib including medicine and ablation were discussed in detail with the patient today. Risk, benefits, and alternatives to EP study and radiofrequency ablation for afib were also discussed in detail today. These risks include but are not limited to stroke, bleeding, vascular damage, tamponade, perforation, damage to the esophagus, lungs, and other structures, pulmonary vein stenosis, worsening renal function, and death. The patient understands these risk and wishes to proceed.  We will therefore proceed with catheter ablation at the next available time.  I will increase toprol to 100mg  BID at this time.  2. Nonischemic CM Likely tachycardia mediated We will increase toprol today for better rate control We will try to maintain sinus rhythm as above  3. Snoring/ fatigue/ afib Sleep study is advised and ordered today  4. CRI Baseline creatinine is 1.8 Will try to minimize dye utilization during his planned ablation

## 2012-11-09 DIAGNOSIS — I428 Other cardiomyopathies: Secondary | ICD-10-CM | POA: Insufficient documentation

## 2012-11-09 DIAGNOSIS — R0683 Snoring: Secondary | ICD-10-CM | POA: Insufficient documentation

## 2012-11-14 ENCOUNTER — Encounter: Payer: Self-pay | Admitting: *Deleted

## 2012-11-14 ENCOUNTER — Ambulatory Visit (HOSPITAL_BASED_OUTPATIENT_CLINIC_OR_DEPARTMENT_OTHER): Payer: Medicare Other | Attending: Internal Medicine | Admitting: Radiology

## 2012-11-14 ENCOUNTER — Other Ambulatory Visit: Payer: Self-pay | Admitting: *Deleted

## 2012-11-14 VITALS — Ht 71.0 in | Wt 195.0 lb

## 2012-11-14 DIAGNOSIS — G4731 Primary central sleep apnea: Secondary | ICD-10-CM | POA: Insufficient documentation

## 2012-11-14 DIAGNOSIS — R259 Unspecified abnormal involuntary movements: Secondary | ICD-10-CM | POA: Insufficient documentation

## 2012-11-14 DIAGNOSIS — I4891 Unspecified atrial fibrillation: Secondary | ICD-10-CM | POA: Insufficient documentation

## 2012-11-14 DIAGNOSIS — G4733 Obstructive sleep apnea (adult) (pediatric): Secondary | ICD-10-CM | POA: Insufficient documentation

## 2012-11-14 DIAGNOSIS — I4949 Other premature depolarization: Secondary | ICD-10-CM | POA: Insufficient documentation

## 2012-11-14 DIAGNOSIS — R0683 Snoring: Secondary | ICD-10-CM

## 2012-11-16 DIAGNOSIS — G4730 Sleep apnea, unspecified: Secondary | ICD-10-CM

## 2012-11-16 DIAGNOSIS — G4710 Hypersomnia, unspecified: Secondary | ICD-10-CM

## 2012-11-16 NOTE — Procedures (Signed)
NAME:  Charles [name], Charles [name] NO.:  [id number]  MEDICAL RECORD NO.:  [id number]          PATIENT TYPE:  OUT  LOCATION:  SLEEP CENTER                 FACILITY:  [facility]  PHYSICIAN:  Barbaraann Share, MD,FCCPDATE OF BIRTH:  19-Apr[birth year]1939  DATE OF STUDY:  11/14/2012                           NOCTURNAL POLYSOMNOGRAM  REFERRING PHYSICIAN:  Hillis Range, MD  LOCATION:  Sleep Lab.  REFERRING PHYSICIAN:  Hillis Range, MD  INDICATION FOR STUDY:  Hypersomnia with sleep apnea  EPWORTH SLEEPINESS SCORE:  7.  SLEEP ARCHITECTURE:  The patient had a total sleep time of 256 minutes with no slow-wave sleep and decreased quantity of REM.  Sleep onset latency was prolonged at 60 minutes and REM onset was normal at 83 minutes.  Sleep efficiency was 57% during the diagnostic portion of the study and also 57% during the titration portion.  RESPIRATORY DATA:  The patient underwent a split night protocol where he was found to have 94 obstructive and central events in the first 126 minutes of sleep.  This gave him an apnea/hypopnea index of 45 events per hour during the diagnostic portion of the study.  The events occurred in all body positions, and there was loud snoring noted throughout.  By protocol, he was fitted with a medium ResMed AirFit F10 full-face mask, and CPAP titration was initiated.  At a optimal pressure of 11 cm of water, the patient had no significant events or breakthrough snoring.  OXYGEN DATA:  There was O2 desaturation as low as 82% with the patient's obstructive events.  CARDIAC DATA:  The patient was noted to be in atrial fibrillation throughout the night, and also had frequent PVCs.  MOVEMENT/PARASOMNIA:  The patient was found to have 281 leg jerks with 4 per hour resulting in arousal or awakening.  There were no abnormal behaviors noted.  It should be noted the majority of the leg jerks occurred at the patient's optimal CPAP  pressure.  IMPRESSION/RECOMMENDATION: 1. Split night study reveals severe obstructive and central sleep     apnea, with an AHI of 45 events per hour and oxygen desaturation as     low as 82% during the diagnostic portion of the study.  The patient     was then fitted with a medium ResMed AirFit F10 full-face mask, and     found to have an optimal CPAP pressure of 11 cm of water.  The     patient should also be encouraged to work on modest weight loss. 2. Atrial fibrillation with frequent premature ventricular     contractions noted throughout the night. 3. Large numbers of periodic limb movements noted with what appears to     be significant sleep disruption.  It is unclear how much of this is     related to the patient's sleep disordered breathing, or whether he     may have a concomitant movement disorder of sleep or some other     medical explanation for his leg jerks.  Clinical correlation is     suggested.     Barbaraann Share, MD,FCCP Diplomate, American Board of Sleep Medicine    [facility]/MED[facility]  D:  11/16/2012 16:05:52  T:  11/16/2012 20:36:36  Job:  [id number]

## 2012-11-21 ENCOUNTER — Ambulatory Visit (INDEPENDENT_AMBULATORY_CARE_PROVIDER_SITE_OTHER): Payer: Medicare Other | Admitting: Cardiology

## 2012-11-21 ENCOUNTER — Telehealth: Payer: Self-pay | Admitting: *Deleted

## 2012-11-21 ENCOUNTER — Other Ambulatory Visit: Payer: Medicare Other

## 2012-11-21 ENCOUNTER — Ambulatory Visit (INDEPENDENT_AMBULATORY_CARE_PROVIDER_SITE_OTHER): Payer: Medicare Other | Admitting: *Deleted

## 2012-11-21 ENCOUNTER — Encounter: Payer: Self-pay | Admitting: Cardiology

## 2012-11-21 VITALS — BP 120/72 | HR 102 | Ht 71.0 in | Wt 193.1 lb

## 2012-11-21 DIAGNOSIS — I10 Essential (primary) hypertension: Secondary | ICD-10-CM

## 2012-11-21 DIAGNOSIS — Z7901 Long term (current) use of anticoagulants: Secondary | ICD-10-CM

## 2012-11-21 DIAGNOSIS — I4891 Unspecified atrial fibrillation: Secondary | ICD-10-CM

## 2012-11-21 DIAGNOSIS — I428 Other cardiomyopathies: Secondary | ICD-10-CM

## 2012-11-21 DIAGNOSIS — G4730 Sleep apnea, unspecified: Secondary | ICD-10-CM

## 2012-11-21 DIAGNOSIS — E039 Hypothyroidism, unspecified: Secondary | ICD-10-CM

## 2012-11-21 DIAGNOSIS — G4731 Primary central sleep apnea: Secondary | ICD-10-CM | POA: Insufficient documentation

## 2012-11-21 LAB — BASIC METABOLIC PANEL
BUN: 33 mg/dL — ABNORMAL HIGH (ref 6–23)
CO2: 32 mEq/L (ref 19–32)
GFR: 39.92 mL/min — ABNORMAL LOW (ref >60.00)
Glucose, Bld: 109 mg/dL — ABNORMAL HIGH (ref 70–99)
Potassium: 4.3 mEq/L (ref 3.5–5.1)
Sodium: 137 mEq/L (ref 135–145)

## 2012-11-21 LAB — CBC WITH DIFFERENTIAL/PLATELET
Basophils Relative: 0.7 % (ref 0.0–3.0)
Eosinophils Relative: 1.4 % (ref 0.0–5.0)
HCT: 41 % (ref 39.0–52.0)
MCV: 86.9 fl (ref 78.0–100.0)
Monocytes Absolute: 0.8 10*3/uL (ref 0.1–1.0)
Monocytes Relative: 9.9 % (ref 3.0–12.0)
Neutrophils Relative %: 67 % (ref 43.0–77.0)
RBC: 4.72 Mil/uL (ref 4.22–5.81)
WBC: 7.7 10*3/uL (ref 4.5–10.5)

## 2012-11-21 LAB — POCT INR: INR: 2.3

## 2012-11-21 NOTE — Progress Notes (Signed)
Charles [name] Date of Birth:  07/06/1940  [facility] [address] [place], [place]  [address] [phone]         Fax   [phone]  History of Present Illness: This pleasant 71-year-old gentleman is seen for a scheduled two month followup office visit. He has a past history of paroxysmal atrial fibrillation and tachycardia- mediated cardiomyopathy. He initially presented several years ago with signs and symptoms of congestive heart failure and was found to be in atrial fibrillation with rapid ventricular response and he had a low ejection fraction. He was cardioverted and showed significant clinical improvement and his echocardiogram on 10/24/11 while in normal sinus rhythm showed an ejection fraction of 55-60% . This was a significant improvement from his original low ejection fraction and tachycardia mediated cardiomyopathy. He did well over the course of the summer of 2013. Several months ago he returned back in atrial fibrillation with a rapid ventricular response. He had an echocardiogram on 08/13/12 which showed that his ejection fraction was down to 40%. His beta blocker dose has been increased without significant improvement in his rapid ventricular response. He underwent direct current cardioversion for the second time on 09/04/12 and converted with one shock to normal sinus rhythm. However prior to returning to the office he noted that he was back in atrial fibrillation on the evening of 09/13/12. The patient states that he feels well. He denies any chest pain or shortness of breath. Following his cardioversion he went on a driving trip to [place] and while there developed worsening peripheral edema which responded to doubling his Lasix from 40 mg a day up to 80 mg daily. The patient does have a history of mild renal insufficiency which becomes worse when we have to treat him with high dose diuretics.  Since his last visit with me on 09/26/12 he has been seen by Dr.  Allred and is scheduled for a ablation procedure on June 10.  He is scheduled for a TEE on June 9 with Dr. Ross.  The patient has also had a sleep study and has been diagnosed with sleep apnea and is awaiting his CPAP machine.   Current Outpatient Prescriptions  Medication Sig Dispense Refill  . Ascorbic Acid (VITAMIN C) 500 MG tablet Take 1,500 mg by mouth daily.        . Cholecalciferol (VITAMIN D) 2000 UNITS CAPS Take 4,000 Units by mouth daily.        . Coenzyme Q10 (COQ10 PO) Take 100 mg by mouth daily.       . enalapril (VASOTEC) 20 MG tablet Take 1 tablet (20 mg total) by mouth daily.  180 tablet  3  . furosemide (LASIX) 40 MG tablet Take 2 tablets (80 mg total) by mouth daily.  180 tablet  3  . guaiFENesin (MUCINEX) 600 MG 12 hr tablet Take 600 mg by mouth 2 (two) times daily as needed for congestion.      . levothyroxine (SYNTHROID, LEVOTHROID) 50 MCG tablet Take 1 tablet (50 mcg total) by mouth daily.  90 tablet  3  . Magnesium Citrate 100 MG TABS Take 400 mg by mouth daily.      . metolazone (ZAROXOLYN) 5 MG tablet Take 5 mg by mouth as needed.      . metoprolol succinate (TOPROL-XL) 100 MG 24 hr tablet Take 200 mg by mouth as directed. TAKE 100 MG IN THE MORNING AND 100 MG IN THE EVENING      . Misc Natural   Products (OSTEO BI-FLEX JOINT SHIELD PO) Take by mouth daily.        . Multiple Vitamin (MULTIVITAMIN) tablet Take 1 tablet by mouth daily.        . Omega-3 Fatty Acids (FISH OIL) 1000 MG CAPS Take by mouth 2 (two) times daily.        . omeprazole (PRILOSEC) 20 MG capsule Take 20 mg by mouth daily as needed.      . warfarin (COUMADIN) 5 MG tablet Take 1 tablet (5 mg total) by mouth daily.  90 tablet  3  . zinc gluconate 50 MG tablet Take 50 mg by mouth daily.         No current facility-administered medications for this visit.    Allergies  Allergen Reactions  . Ceftriaxone Sodium     Rocephin  . Diltiazem Hcl     Cardizem  . Penicillins     Patient Active Problem  List   Diagnosis Date Noted  . Atrial fibrillation     Priority: High  . CHF (congestive heart failure)     Priority: High  . Hypertension     Priority: High  . Nonischemic cardiomyopathy 11/09/2012  . Snoring 11/09/2012  . Hypothyroidism 08/26/2012  . Long term (current) use of anticoagulants 08/01/2012  . Tremor 10/24/2011  . Peripheral edema   . Swelling   . Mitral regurgitation   . Orthopnea   . Dyspnea   . Hypercholesterolemia   . Chronic renal insufficiency   . HYPERTENSION 11/07/2006  . GERD 11/07/2006  . ABDOMINAL WALL HERNIA 11/07/2006  . OSTEOARTHROSIS NOS, UNSPECIFIED SITE 11/07/2006  . ABRASION, LOWER EXTREMITY W/INFECTION 11/07/2006  . NEPHROLITHIASIS, HX OF 11/07/2006  . HIP REPLACEMENT, TOTAL, HX OF 11/07/2006    History  Smoking status  . Never Smoker   Smokeless tobacco  . Never Used    History  Alcohol Use No    Family History  Problem Relation Age of Onset  . Cancer Mother 83    CANCER  . Heart disease Father 85    HEART PROBLEMS    Review of Systems: Constitutional: no fever chills diaphoresis or fatigue or change in weight.  Head and neck: no hearing loss, no epistaxis, no photophobia or visual disturbance. Respiratory: No cough, shortness of breath or wheezing. Cardiovascular: No chest pain peripheral edema, palpitations. Gastrointestinal: No abdominal distention, no abdominal pain, no change in bowel habits hematochezia or melena. Genitourinary: No dysuria, no frequency, no urgency, no nocturia. Musculoskeletal:No arthralgias, no back pain, no gait disturbance or myalgias. Neurological: No dizziness, no headaches, no numbness, no seizures, no syncope, no weakness, no tremors. Hematologic: No lymphadenopathy, no easy bruising. Psychiatric: No confusion, no hallucinations, no sleep disturbance.    Physical Exam: Filed Vitals:   11/21/12 0847  BP: 120/72  Pulse: 102   the general appearance reveals a healthy-appearing elderly  gentleman in no distress.The head and neck exam reveals pupils equal and reactive.  Extraocular movements are full.  There is no scleral icterus.  The mouth and pharynx are normal.  The neck is supple.  The carotids reveal no bruits.  The jugular venous pressure is normal.  The  thyroid is not enlarged.  There is no lymphadenopathy.  The chest is clear to percussion and auscultation.  There are no rales or rhonchi.  Expansion of the chest is symmetrical.  The precordium is quiet.  The pulse is irregular.  The first heart sound is normal.  The second heart sound is physiologically   split.  There is no murmur gallop rub or click.  There is no abnormal lift or heave.  The abdomen is soft and nontender.  The bowel sounds are normal.  The liver and spleen are not enlarged.  There are no abdominal masses.  There are no abdominal bruits.  Extremities reveal good pedal pulses.  There is no phlebitis or edema.  There is no cyanosis or clubbing.  Strength is normal and symmetrical in all extremities.  There is no lateralizing weakness.  There are no sensory deficits.  The skin is warm and dry.  There is no rash.   EKG shows atrial fibrillation with rapid ventricular response of 102 beats per minute and nonspecific ST-T wave changes.  Assessment / Plan: Continue on same medication.  Proceed with radiofrequency ablation per Dr. Allred scheduled for June 10. Return here in 2 months for office visit and EKG.  Blood work today is pending.    

## 2012-11-21 NOTE — Assessment & Plan Note (Signed)
The patient has a history of hypothyroidism.  He is on Synthroid replacement.  The patient is no longer on amiodarone.

## 2012-11-21 NOTE — Progress Notes (Signed)
Quick Note:  Please report to patient. The recent labs are stable. Continue same medication and careful diet. ______ 

## 2012-11-21 NOTE — Assessment & Plan Note (Signed)
The patient has a tachycardia mediated cardiomyopathy.  He has exertional dyspnea.  This is unchanged since last visit.  He remains on furosemide 80 mg daily.  He takes a supplemental Zaroxolyn approximately every 10 days

## 2012-11-21 NOTE — Assessment & Plan Note (Signed)
The patient has sleep apnea.  He snores heavily his wife states that he also stops breathing.  He is awaiting his CPAP machine which has been ordered.

## 2012-11-21 NOTE — Telephone Encounter (Signed)
Message copied by Burnell Blanks on Thu Nov 21, 2012  3:57 PM ------      Message from: [name]      Created: Thu Nov 21, 2012  2:10 PM       Please report to patient.  The recent labs are stable. Continue same medication and careful diet. ------

## 2012-11-21 NOTE — Telephone Encounter (Signed)
Advised patient of lab results  

## 2012-11-21 NOTE — Patient Instructions (Addendum)
Will obtain labs today and call you with the results (bmet/cbc)  Your physician recommends that you continue on your current medications as directed. Please refer to the Current Medication list given to you today.  Your physician recommends that you schedule a follow-up appointment in: 2 month ov/ekg  

## 2012-11-21 NOTE — Telephone Encounter (Signed)
Message copied by Burnell Blanks on Thu Nov 21, 2012  3:54 PM ------      Message from: [name]      Created: Thu Nov 21, 2012  2:10 PM       Please report to patient.  The recent labs are stable. Continue same medication and careful diet. ------

## 2012-11-21 NOTE — Assessment & Plan Note (Signed)
Blood pressure has been stable and has been lower since he is in atrial fibrillation.  No dizziness or syncope

## 2012-11-25 ENCOUNTER — Encounter (HOSPITAL_COMMUNITY): Payer: Self-pay | Admitting: Pharmacy Technician

## 2012-11-26 ENCOUNTER — Telehealth: Payer: Self-pay | Admitting: Internal Medicine

## 2012-11-26 DIAGNOSIS — G4730 Sleep apnea, unspecified: Secondary | ICD-10-CM

## 2012-11-26 NOTE — Telephone Encounter (Signed)
Follow Up     Pt has some questions regarding follow up info from sleep test Dr. [name] ordered. Please call,.

## 2012-11-27 NOTE — Telephone Encounter (Signed)
Spoke with patient and his results for sleep study are in computer but seem incomplete to me. Will have [name] review and call the patient back

## 2012-11-27 NOTE — Telephone Encounter (Signed)
Follow Up     Pt is calling in wanting to speak to nurse regarding some questions he has. Did not disclose any more information.

## 2012-11-28 ENCOUNTER — Ambulatory Visit (INDEPENDENT_AMBULATORY_CARE_PROVIDER_SITE_OTHER): Payer: Medicare Other | Admitting: *Deleted

## 2012-11-28 DIAGNOSIS — Z7901 Long term (current) use of anticoagulants: Secondary | ICD-10-CM

## 2012-11-28 DIAGNOSIS — I4891 Unspecified atrial fibrillation: Secondary | ICD-10-CM

## 2012-11-28 NOTE — Telephone Encounter (Signed)
Patient aware of results and I will set up to see Dr Shelle Iron

## 2012-11-29 ENCOUNTER — Encounter (HOSPITAL_COMMUNITY): Payer: Self-pay | Admitting: Pharmacy Technician

## 2012-12-02 ENCOUNTER — Ambulatory Visit (HOSPITAL_COMMUNITY)
Admission: RE | Admit: 2012-12-02 | Discharge: 2012-12-04 | Disposition: A | Discharge status: Home / Self Care | Payer: Medicare Other | Source: Ambulatory Visit | Attending: Internal Medicine | Admitting: Internal Medicine

## 2012-12-02 ENCOUNTER — Encounter (HOSPITAL_COMMUNITY): Payer: Self-pay | Admitting: Gastroenterology

## 2012-12-02 ENCOUNTER — Encounter (HOSPITAL_COMMUNITY)
Admission: RE | Disposition: A | Discharge status: Home / Self Care | Payer: Self-pay | Source: Ambulatory Visit | Attending: Internal Medicine

## 2012-12-02 DIAGNOSIS — I4891 Unspecified atrial fibrillation: Secondary | ICD-10-CM | POA: Diagnosis present

## 2012-12-02 DIAGNOSIS — N189 Chronic kidney disease, unspecified: Secondary | ICD-10-CM | POA: Diagnosis present

## 2012-12-02 DIAGNOSIS — I428 Other cardiomyopathies: Secondary | ICD-10-CM | POA: Insufficient documentation

## 2012-12-02 DIAGNOSIS — I509 Heart failure, unspecified: Secondary | ICD-10-CM | POA: Insufficient documentation

## 2012-12-02 DIAGNOSIS — I129 Hypertensive chronic kidney disease with stage 1 through stage 4 chronic kidney disease, or unspecified chronic kidney disease: Secondary | ICD-10-CM | POA: Insufficient documentation

## 2012-12-02 DIAGNOSIS — Z79899 Other long term (current) drug therapy: Secondary | ICD-10-CM | POA: Insufficient documentation

## 2012-12-02 HISTORY — PX: TEE WITHOUT CARDIOVERSION: SHX5443

## 2012-12-02 SURGERY — ECHOCARDIOGRAM, TRANSESOPHAGEAL
Anesthesia: Moderate Sedation

## 2012-12-02 MED ORDER — SODIUM CHLORIDE 0.9 % IV SOLN
INTRAVENOUS | Status: DC
  Administered 2012-12-02: 500 mL via INTRAVENOUS
  Administered 2012-12-03: 08:00:00 via INTRAVENOUS

## 2012-12-02 MED ORDER — FENTANYL CITRATE 0.05 MG/ML IJ SOLN
INTRAMUSCULAR | Status: AC
  Filled 2012-12-02: qty 2

## 2012-12-02 MED ORDER — LIDOCAINE VISCOUS 2 % MT SOLN
OROMUCOSAL | Status: AC
  Filled 2012-12-02: qty 15

## 2012-12-02 MED ORDER — FENTANYL CITRATE 0.05 MG/ML IJ SOLN
INTRAMUSCULAR | Status: DC | PRN
  Administered 2012-12-02 (×2): 25 ug via INTRAVENOUS

## 2012-12-02 MED ORDER — LIDOCAINE VISCOUS 2 % MT SOLN
OROMUCOSAL | Status: DC | PRN
  Administered 2012-12-02: 1 via OROMUCOSAL

## 2012-12-02 MED ORDER — MIDAZOLAM HCL 10 MG/2ML IJ SOLN
INTRAMUSCULAR | Status: DC | PRN
  Administered 2012-12-02: 2 mg via INTRAVENOUS

## 2012-12-02 MED ORDER — MIDAZOLAM HCL 5 MG/ML IJ SOLN
INTRAMUSCULAR | Status: AC
  Filled 2012-12-02: qty 2

## 2012-12-02 NOTE — H&P (View-Only) (Signed)
Charles [name] Date of Birth:  April 23, 1941 [facility] [address] [place], [place]  [address] [phone]         Fax   [phone]  History of Present Illness: This pleasant 72 year old gentleman is seen for a scheduled two month followup office visit. He has a past history of paroxysmal atrial fibrillation and tachycardia- mediated cardiomyopathy. He initially presented several years ago with signs and symptoms of congestive heart failure and was found to be in atrial fibrillation with rapid ventricular response and he had a low ejection fraction. He was cardioverted and showed significant clinical improvement and his echocardiogram on 10/24/11 while in normal sinus rhythm showed an ejection fraction of 55-60% . This was a significant improvement from his original low ejection fraction and tachycardia mediated cardiomyopathy. He did well over the course of the summer of 2013. Several months ago he returned back in atrial fibrillation with a rapid ventricular response. He had an echocardiogram on 08/13/12 which showed that his ejection fraction was down to 40%. His beta blocker dose has been increased without significant improvement in his rapid ventricular response. He underwent direct current cardioversion for the second time on 09/04/12 and converted with one shock to normal sinus rhythm. However prior to returning to the office he noted that he was back in atrial fibrillation on the evening of 09/13/12. The patient states that he feels well. He denies any chest pain or shortness of breath. Following his cardioversion he went on a driving trip to [place] and while there developed worsening peripheral edema which responded to doubling his Lasix from 40 mg a day up to 80 mg daily. The patient does have a history of mild renal insufficiency which becomes worse when we have to treat him with high dose diuretics.  Since his last visit with me on 09/26/12 he has been seen by Dr.  [name] and is scheduled for a ablation procedure on June 10.  He is scheduled for a TEE on June 9 with Dr. Tenny Craw.  The patient has also had a sleep study and has been diagnosed with sleep apnea and is awaiting his CPAP machine.   Current Outpatient Prescriptions  Medication Sig Dispense Refill  . Ascorbic Acid (VITAMIN C) 500 MG tablet Take 1,500 mg by mouth daily.        . Cholecalciferol (VITAMIN D) 2000 UNITS CAPS Take 4,000 Units by mouth daily.        . Coenzyme Q10 (COQ10 PO) Take 100 mg by mouth daily.       . enalapril (VASOTEC) 20 MG tablet Take 1 tablet (20 mg total) by mouth daily.  180 tablet  3  . furosemide (LASIX) 40 MG tablet Take 2 tablets (80 mg total) by mouth daily.  180 tablet  3  . guaiFENesin (MUCINEX) 600 MG 12 hr tablet Take 600 mg by mouth 2 (two) times daily as needed for congestion.      Marland Kitchen levothyroxine (SYNTHROID, LEVOTHROID) 50 MCG tablet Take 1 tablet (50 mcg total) by mouth daily.  90 tablet  3  . Magnesium Citrate 100 MG TABS Take 400 mg by mouth daily.      . metolazone (ZAROXOLYN) 5 MG tablet Take 5 mg by mouth as needed.      . metoprolol succinate (TOPROL-XL) 100 MG 24 hr tablet Take 200 mg by mouth as directed. TAKE 100 MG IN THE MORNING AND 100 MG IN THE EVENING      . Misc Natural  Products (OSTEO BI-FLEX JOINT SHIELD PO) Take by mouth daily.        . Multiple Vitamin (MULTIVITAMIN) tablet Take 1 tablet by mouth daily.        . Omega-3 Fatty Acids (FISH OIL) 1000 MG CAPS Take by mouth 2 (two) times daily.        Marland Kitchen omeprazole (PRILOSEC) 20 MG capsule Take 20 mg by mouth daily as needed.      . warfarin (COUMADIN) 5 MG tablet Take 1 tablet (5 mg total) by mouth daily.  90 tablet  3  . zinc gluconate 50 MG tablet Take 50 mg by mouth daily.         No current facility-administered medications for this visit.    Allergies  Allergen Reactions  . Ceftriaxone Sodium     Rocephin  . Diltiazem Hcl     Cardizem  . Penicillins     Patient Active Problem  List   Diagnosis Date Noted  . Atrial fibrillation     Priority: High  . CHF (congestive heart failure)     Priority: High  . Hypertension     Priority: High  . Nonischemic cardiomyopathy 11/09/2012  . Snoring 11/09/2012  . Hypothyroidism 08/26/2012  . Long term (current) use of anticoagulants 08/01/2012  . Tremor 10/24/2011  . Peripheral edema   . Swelling   . Mitral regurgitation   . Orthopnea   . Dyspnea   . Hypercholesterolemia   . Chronic renal insufficiency   . HYPERTENSION 11/07/2006  . GERD 11/07/2006  . ABDOMINAL WALL HERNIA 11/07/2006  . OSTEOARTHROSIS NOS, UNSPECIFIED SITE 11/07/2006  . ABRASION, LOWER EXTREMITY W/INFECTION 11/07/2006  . NEPHROLITHIASIS, HX OF 11/07/2006  . HIP REPLACEMENT, TOTAL, HX OF 11/07/2006    History  Smoking status  . Never Smoker   Smokeless tobacco  . Never Used    History  Alcohol Use No    Family History  Problem Relation Age of Onset  . Cancer Mother 56    CANCER  . Heart disease Father 29    HEART PROBLEMS    Review of Systems: Constitutional: no fever chills diaphoresis or fatigue or change in weight.  Head and neck: no hearing loss, no epistaxis, no photophobia or visual disturbance. Respiratory: No cough, shortness of breath or wheezing. Cardiovascular: No chest pain peripheral edema, palpitations. Gastrointestinal: No abdominal distention, no abdominal pain, no change in bowel habits hematochezia or melena. Genitourinary: No dysuria, no frequency, no urgency, no nocturia. Musculoskeletal:No arthralgias, no back pain, no gait disturbance or myalgias. Neurological: No dizziness, no headaches, no numbness, no seizures, no syncope, no weakness, no tremors. Hematologic: No lymphadenopathy, no easy bruising. Psychiatric: No confusion, no hallucinations, no sleep disturbance.    Physical Exam: Filed Vitals:   11/21/12 0847  BP: 120/72  Pulse: 102   the general appearance reveals a healthy-appearing elderly  gentleman in no distress.The head and neck exam reveals pupils equal and reactive.  Extraocular movements are full.  There is no scleral icterus.  The mouth and pharynx are normal.  The neck is supple.  The carotids reveal no bruits.  The jugular venous pressure is normal.  The  thyroid is not enlarged.  There is no lymphadenopathy.  The chest is clear to percussion and auscultation.  There are no rales or rhonchi.  Expansion of the chest is symmetrical.  The precordium is quiet.  The pulse is irregular.  The first heart sound is normal.  The second heart sound is physiologically  split.  There is no murmur gallop rub or click.  There is no abnormal lift or heave.  The abdomen is soft and nontender.  The bowel sounds are normal.  The liver and spleen are not enlarged.  There are no abdominal masses.  There are no abdominal bruits.  Extremities reveal good pedal pulses.  There is no phlebitis or edema.  There is no cyanosis or clubbing.  Strength is normal and symmetrical in all extremities.  There is no lateralizing weakness.  There are no sensory deficits.  The skin is warm and dry.  There is no rash.   EKG shows atrial fibrillation with rapid ventricular response of 102 beats per minute and nonspecific ST-T wave changes.  Assessment / Plan: Continue on same medication.  Proceed with radiofrequency ablation per Dr. [name] scheduled for June 10. Return here in 2 months for office visit and EKG.  Blood work today is pending.

## 2012-12-02 NOTE — Interval H&P Note (Signed)
History and Physical Interval Note:  12/02/2012 9:58 AM  Charles [name]  has presented today for surgery, with the diagnosis of a-fib  The various methods of treatment have been discussed with the patient and family. After consideration of risks, benefits and other options for treatment, the patient has consented to  Procedure(s): TRANSESOPHAGEAL ECHOCARDIOGRAM (TEE) (N/A) as a surgical intervention .  The patient's history has been reviewed, patient examined, no change in status, stable for surgery.  I have reviewed the patient's chart and labs.  Questions were answered to the patient's satisfaction.     [name]

## 2012-12-02 NOTE — H&P (View-Only) (Signed)
Quick Note:  Please report to patient. The recent labs are stable. Continue same medication and careful diet. ______ 

## 2012-12-02 NOTE — Op Note (Signed)
No thrombus in LA/LAA.  Full report to follow

## 2012-12-02 NOTE — Progress Notes (Signed)
  Echocardiogram Echocardiogram Transesophageal has been performed.  Charles [name] 12/02/2012, 11:12 AM

## 2012-12-02 NOTE — Interval H&P Note (Signed)
History and Physical Interval Note:  12/02/2012 9:49 AM  Charles [name]  has presented today for surgery, with the diagnosis of a-fib  The various methods of treatment have been discussed with the patient and family. After consideration of risks, benefits and other options for treatment, the patient has consented to  Procedure(s): TRANSESOPHAGEAL ECHOCARDIOGRAM (TEE) (N/A) as a surgical intervention .  The patient's history has been reviewed, patient examined, no change in status, stable for surgery.  I have reviewed the patient's chart and labs.  Questions were answered to the patient's satisfaction.     [name]

## 2012-12-03 ENCOUNTER — Ambulatory Visit (HOSPITAL_COMMUNITY): Payer: Medicare Other | Admitting: Anesthesiology

## 2012-12-03 ENCOUNTER — Encounter (HOSPITAL_COMMUNITY): Payer: Self-pay | Admitting: Anesthesiology

## 2012-12-03 ENCOUNTER — Encounter (HOSPITAL_COMMUNITY)
Admission: RE | Disposition: A | Discharge status: Home / Self Care | Payer: Self-pay | Source: Ambulatory Visit | Attending: Internal Medicine

## 2012-12-03 ENCOUNTER — Ambulatory Visit (HOSPITAL_COMMUNITY): Admission: RE | Admit: 2012-12-03 | Payer: Medicare Other | Source: Ambulatory Visit | Admitting: Internal Medicine

## 2012-12-03 DIAGNOSIS — I4891 Unspecified atrial fibrillation: Secondary | ICD-10-CM

## 2012-12-03 HISTORY — PX: ATRIAL FIBRILLATION ABLATION: SHX5456

## 2012-12-03 LAB — BASIC METABOLIC PANEL
CO2: 31 mEq/L (ref 19–32)
Chloride: 97 mEq/L (ref 96–112)
Creatinine, Ser: 1.86 mg/dL — ABNORMAL HIGH (ref 0.50–1.35)
Potassium: 4.1 mEq/L (ref 3.5–5.1)
Sodium: 138 mEq/L (ref 135–145)

## 2012-12-03 LAB — POCT ACTIVATED CLOTTING TIME
Activated Clotting Time: 209 seconds
Activated Clotting Time: 203 seconds
Activated Clotting Time: 209 seconds

## 2012-12-03 LAB — PROTIME-INR: INR: 2.06 — ABNORMAL HIGH (ref 0.00–1.49)

## 2012-12-03 SURGERY — ATRIAL FIBRILLATION ABLATION
Anesthesia: General

## 2012-12-03 MED ORDER — WARFARIN - PHYSICIAN DOSING INPATIENT
Freq: Every day | Status: DC
  Administered 2012-12-03: 17:00:00

## 2012-12-03 MED ORDER — LIDOCAINE HCL (CARDIAC) 20 MG/ML IV SOLN
INTRAVENOUS | Status: DC | PRN
  Administered 2012-12-03: 20 mg via INTRAVENOUS

## 2012-12-03 MED ORDER — ALBUMIN HUMAN 5 % IV SOLN
INTRAVENOUS | Status: DC | PRN
  Administered 2012-12-03: 09:00:00 via INTRAVENOUS

## 2012-12-03 MED ORDER — MIDAZOLAM HCL 5 MG/5ML IJ SOLN
INTRAMUSCULAR | Status: DC | PRN
  Administered 2012-12-03 (×2): 1 mg via INTRAVENOUS

## 2012-12-03 MED ORDER — LACTATED RINGERS IV SOLN
INTRAVENOUS | Status: DC | PRN
  Administered 2012-12-03: 10:00:00 via INTRAVENOUS

## 2012-12-03 MED ORDER — ACETAMINOPHEN 325 MG PO TABS: 650.0000 mg | ORAL_TABLET | ORAL | Status: DC | PRN

## 2012-12-03 MED ORDER — LEVOTHYROXINE SODIUM 50 MCG PO TABS
50.0000 ug | ORAL_TABLET | Freq: Every day | ORAL | Status: DC
  Administered 2012-12-04: 50 ug via ORAL
  Filled 2012-12-03 (×2): qty 1

## 2012-12-03 MED ORDER — PHENYLEPHRINE HCL 10 MG/ML IJ SOLN
INTRAMUSCULAR | Status: DC | PRN
  Administered 2012-12-03 (×10): 80 ug via INTRAVENOUS

## 2012-12-03 MED ORDER — HEPARIN SODIUM (PORCINE) 1000 UNIT/ML IJ SOLN
INTRAMUSCULAR | Status: DC | PRN
  Administered 2012-12-03: 5000 [IU] via INTRAVENOUS
  Administered 2012-12-03: 1000 [IU] via INTRAVENOUS

## 2012-12-03 MED ORDER — METOCLOPRAMIDE HCL 5 MG/ML IJ SOLN: 10.0000 mg | Freq: Once | INTRAMUSCULAR | Status: DC | PRN

## 2012-12-03 MED ORDER — SODIUM CHLORIDE 0.9 % IV SOLN: 250.0000 mL | INTRAVENOUS | Status: DC | PRN

## 2012-12-03 MED ORDER — ONDANSETRON HCL 4 MG/2ML IJ SOLN: 4.0000 mg | Freq: Four times a day (QID) | INTRAMUSCULAR | Status: DC | PRN

## 2012-12-03 MED ORDER — WARFARIN SODIUM 7.5 MG PO TABS
7.5000 mg | ORAL_TABLET | Freq: Every day | ORAL | Status: DC
Start: 2012-12-03 — End: 2012-12-04
  Administered 2012-12-03: 7.5 mg via ORAL
  Filled 2012-12-03 (×2): qty 1

## 2012-12-03 MED ORDER — HEPARIN SODIUM (PORCINE) 1000 UNIT/ML IJ SOLN
INTRAMUSCULAR | Status: AC
  Filled 2012-12-03: qty 1

## 2012-12-03 MED ORDER — FENTANYL CITRATE 0.05 MG/ML IJ SOLN: 25.0000 ug | INTRAMUSCULAR | Status: DC | PRN

## 2012-12-03 MED ORDER — FENTANYL CITRATE 0.05 MG/ML IJ SOLN
INTRAMUSCULAR | Status: DC | PRN
  Administered 2012-12-03 (×2): 50 ug via INTRAVENOUS
  Administered 2012-12-03: 25 ug via INTRAVENOUS
  Administered 2012-12-03: 50 ug via INTRAVENOUS

## 2012-12-03 MED ORDER — BUPIVACAINE HCL (PF) 0.25 % IJ SOLN
INTRAMUSCULAR | Status: AC
  Filled 2012-12-03: qty 30

## 2012-12-03 MED ORDER — PROPOFOL 10 MG/ML IV BOLUS
INTRAVENOUS | Status: DC | PRN
  Administered 2012-12-03: 120 mg via INTRAVENOUS

## 2012-12-03 MED ORDER — SODIUM CHLORIDE 0.9 % IJ SOLN
3.0000 mL | Freq: Two times a day (BID) | INTRAMUSCULAR | Status: DC
  Administered 2012-12-03: 3 mL via INTRAVENOUS

## 2012-12-03 MED ORDER — SODIUM CHLORIDE 0.9 % IJ SOLN: 3.0000 mL | INTRAMUSCULAR | Status: DC | PRN

## 2012-12-03 MED ORDER — ACETAMINOPHEN 10 MG/ML IV SOLN: 1000.0000 mg | Freq: Once | INTRAVENOUS | Status: DC | PRN

## 2012-12-03 MED ORDER — PANTOPRAZOLE SODIUM 40 MG PO TBEC
40.0000 mg | DELAYED_RELEASE_TABLET | Freq: Every day | ORAL | Status: DC
  Administered 2012-12-03: 40 mg via ORAL
  Filled 2012-12-03: qty 1

## 2012-12-03 MED ORDER — EPHEDRINE SULFATE 50 MG/ML IJ SOLN
INTRAMUSCULAR | Status: DC | PRN
  Administered 2012-12-03 (×3): 10 mg via INTRAVENOUS

## 2012-12-03 MED ORDER — HYDROCODONE-ACETAMINOPHEN 5-325 MG PO TABS
1.0000 | ORAL_TABLET | ORAL | Status: DC | PRN
  Administered 2012-12-04: 2 via ORAL
  Filled 2012-12-03: qty 2

## 2012-12-03 MED ORDER — PROTAMINE SULFATE 10 MG/ML IV SOLN
INTRAVENOUS | Status: DC | PRN
  Administered 2012-12-03: 20 mg via INTRAVENOUS
  Administered 2012-12-03 (×2): 10 mg via INTRAVENOUS

## 2012-12-03 NOTE — Discharge Summary (Addendum)
ELECTROPHYSIOLOGY PROCEDURE DISCHARGE SUMMARY    Patient ID: Charles [name],  MRN: [id number], DOB/AGE: May 18, 1941 72 y.o.  Admit date: 12/02/2012 Discharge date: 12/04/2012  Primary Care Physician: [name], MD Primary Cardiologist: [name], MD Electrophysiologist: Hillis Range, MD  Primary Discharge Diagnosis:  Persistent atrial fibrillation status post ablation this admission  Secondary Discharge Diagnosis:  1.  Non-ischemic cardiomyopathy- likely tachycardia mediated 2.  Sleep apnea- diagnosed by recent sleep study- follow up scheduled with Dr Shelle Iron 3.  Chronic renal insufficiency 4.  Hypertension 5.  Hyperlipidemia 6.  GERD 7.  Arthritis  Procedures This Admission:  1.  Electrophysiology study and radiofrequency catheter ablation of atrial fibrillation on 12-03-2012 by  [name]. This study demonstrated atrial fibrillation upon presentation; successful electrical isolation and anatomical encircling of all four pulmonary veins with radiofrequency current. CFAEs were identified and ablated along the roof of the left atrium, base of the left atrial appendage (LAA), along the ridge between the left superior pulmonary vein and the left atrial appendage, the interatrial septum, along the lateral wall of the left atrium, and above the coronary sinus. CFAEs were also ablated within the coronary sinus. The SVC/RA junction also isolated with RF. Atrial fibrillation was successfully cardioverted to sinus rhythm. There were no early apparent complications  Brief HPI: Charles [name] is a 72 y.o. male with a h/o persistent atrial fibrillation and tachycardia mediated cardiomyopathy who was referred to [name] for EP consultation. He has symptomatic persistent  atrial fibrillation with difficult to control ventricular rates which have resulted in a tachycardia induced cardiomyopathy.  He has failed medical therapy with Amiodarone.  Risks, benefits, and alternatives to  ablation were reviewed with the patient who wished to proceed.  TEE was performed on 12-02-2012 which demonstrated no LAA thrombus.   Hospital Course:  The patient was admitted and underwent EPS/RFCA with details as outlined above.  He was monitored on telemetry overnight which demonstrated sinus rhythm with occasional PVC's.  Because of bradycardia following his procedure, his Metoprolol dose was decreased to 100mg  daily on discharge.  His groin incision was without complication.  [name] examined the patient and considered him stable for discharge to home.   Physical Exam: Filed Vitals:   12/04/12 0200 12/04/12 0400 12/04/12 0500 12/04/12 0600  BP: 97/68 117/63  104/52  Pulse: 69 67  71  Temp:  98.1 F (36.7 C)    TempSrc:  Oral    Resp: 14 15  17   Height:      Weight:   195 lb 14.4 oz (88.86 kg)   SpO2: 98% 98%  91%    GEN- The patient is well appearing, alert and oriented x 3 today.   Head- normocephalic, atraumatic Eyes-  Sclera clear, conjunctiva pink Ears- hearing intact Oropharynx- clear Neck- supple, no JVP Lymph- no cervical lymphadenopathy Lungs- Clear to ausculation bilaterally, normal work of breathing Heart- Regular rate and rhythm, no murmurs, rubs or gallops, PMI not laterally displaced GI- soft, NT, ND, + BS Extremities- no clubbing, cyanosis, or edema, no hematoma/ bruit MS- no significant deformity or atrophy Skin- no rash or lesion Psych- euthymic mood, full affect Neuro- strength and sensation are intact  Discharge Vitals: Blood pressure 104/52, pulse 71, temperature 98.1 F (36.7 C), temperature source Oral, resp. rate 17, height 5\' 11"  (1.803 m), weight 195 lb 14.4 oz (88.86 kg), SpO2 91.00%.    Labs:   Lab Results  Component Value Date   WBC 7.7 11/21/2012  HGB 13.8 11/21/2012   HCT 41.0 11/21/2012   MCV 86.9 11/21/2012   PLT 166.0 11/21/2012     Recent Labs Lab 12/04/12 0455  NA 139  K 3.8  CL 100  CO2 27  BUN 31*  CREATININE 1.59*    CALCIUM 9.1  GLUCOSE 111*    Discharge Medications:    Medication List    TAKE these medications       COQ10 PO  Take 100 mg by mouth daily.     enalapril 20 MG tablet  Commonly known as:  VASOTEC  Take 20 mg by mouth daily.     Fish Oil 1000 MG Caps  Take 1 capsule by mouth 2 (two) times daily.     furosemide 40 MG tablet  Commonly known as:  LASIX  Take 80 mg by mouth daily.     guaiFENesin 600 MG 12 hr tablet  Commonly known as:  MUCINEX  Take 600 mg by mouth 2 (two) times daily as needed for congestion.     levothyroxine 50 MCG tablet  Commonly known as:  SYNTHROID, LEVOTHROID  Take 50 mcg by mouth daily before breakfast.     Magnesium 200 MG Tabs  Take 1 tablet by mouth 2 (two) times daily.     Magnesium Citrate 100 MG Tabs  Take 200 mg by mouth 2 (two) times daily.     metolazone 5 MG tablet  Commonly known as:  ZAROXOLYN  Take 5 mg by mouth as needed (for swelling).     metoprolol succinate 100 MG 24 hr tablet  Commonly known as:  TOPROL-XL  Take 1 tablet (100 mg total) by mouth daily.     multivitamin tablet  Take 1 tablet by mouth daily.     omeprazole 20 MG capsule  Commonly known as:  PRILOSEC  Take 20 mg by mouth daily as needed (for acid reflux).     OSTEO BI-FLEX JOINT SHIELD PO  Take 2 tablets by mouth daily.     vitamin C 500 MG tablet  Commonly known as:  ASCORBIC ACID  Take 1,500 mg by mouth daily.     Vitamin D 2000 UNITS Caps  Take 4,000 Units by mouth daily.     warfarin 5 MG tablet  Commonly known as:  COUMADIN  Take 5-7.5 mg by mouth daily. Takes 5mg  every day, except takes 7.5mg  on mondays     zinc gluconate 50 MG tablet  Take 50 mg by mouth daily.        Disposition:   Future Appointments Provider Department Dept Phone   12/10/2012 9:00 AM [facility] [phone]   12/19/2012 11:30 AM Barbaraann Share, MD [facility] [phone]   01/21/2013 9:45 AM [name], MD [facility] Main Office [place]) [phone]   03/03/2013 9:45 AM Hillis Range, MD [facility] Main Office [place]) [phone]     Follow-up Information   Follow up with Hillis Range, MD On 03/03/2013. (9:45 am)    Contact information:   [address] [place] [address] [phone]       Follow up with [facility] On 12/10/2012. (9am)    Contact information:   [address] [place] [address] [phone]      Follow up with [name], MD On 01/21/2013. (9:45)    Contact information:   [address] [place] [address] [phone]  Duration of Discharge Encounter: Greater than 30 minutes including physician time.   Hillis Range, MD

## 2012-12-03 NOTE — Preoperative (Signed)
Beta Blockers   Reason not to administer Beta Blockers:Not Applicable 

## 2012-12-03 NOTE — Op Note (Signed)
SURGEON:  Hillis Range, MD  PREPROCEDURE DIAGNOSES: 1. Persistent atrial fibrillation.  POSTPROCEDURE DIAGNOSES: 1. Persistent atrial fibrillation.  PROCEDURES: 1. Comprehensive electrophysiologic study. 2. Coronary sinus pacing and recording. 3. Three-dimensional mapping of atrial fibrillation (with additional mapping and ablation of a second discrete focus) 4. Ablation of atrial fibrillation (with additional mapping and ablation of a second discrete focus) 5. Intracardiac echocardiography. 6. Transseptal puncture of an intact septum. 7. Pulmonary vein venography/ Rotational Angiography with processing at an independent workstation 8. Arrhythmia induction with pacing 9.External cardioversion.  INTRODUCTION:  Charles [name] is a 72 y.o. male with a history of persistent atrial fibrillation who now presents for EP study and radiofrequency ablation.  The patient reports initially being diagnosed with atrial fibrillation after presenting with symptomatic palpitations and fatgiue.  The patient has failed medical therapy with amiodarone.  The patient therefore presents today for catheter ablation of atrial fibrillation.  DESCRIPTION OF PROCEDURE:  Informed written consent was obtained, and the patient was brought to the electrophysiology lab in a fasting state.  The patient was adequately sedated with intravenous medications as outlined in the anesthesia report.  The patient's left and right groins were prepped and draped in the usual sterile fashion by the EP lab staff.  Using a percutaneous Seldinger technique, two 7-French and one 11-French hemostasis sheaths were placed into the right common femoral vein.    Catheter Placement:  A 7-French Biosense Webster Decapolar coronary sinus catheter was introduced through the right common femoral vein and advanced into the coronary sinus for recording and pacing from this location.  A 6-French quadripolar Josephson catheter was introduced through the  right common femoral vein and advanced into the right ventricle for recording and pacing.  This catheter was then pulled back to the His bundle location.    Initial Measurements: The patient presented to the electrophysiology lab in atrial fibrillation.  The  average RR interval measured 571 msec.  The HV interval 42 msec.     Intracardiac Echocardiography: A 10-French Biosense Webster AcuNav intracardiac echocardiography catheter was introduced through the left common femoral vein and advanced into the right atrium. Intracardiac echocardiography was performed of the left atrium, and a three-dimensional anatomical rendering of the left atrium was performed using CARTO sound technology.  The patient was noted to have a moderate sized left atrium.  The interatrial septum was prominent but not aneurysmal. All 4 pulmonary veins were visualized and noted to have separate ostia.  There was a right middle pulmonary vein arising from near the ostium of the right inferior pulmonary vein.  The pulmonary veins were moderate in size.  The left atrial appendage was visualized and did not reveal thrombus.   There was no evidence of pulmonary vein stenosis.   Transseptal Puncture: The middle right common femoral vein sheath was exchanged for an 8.5 [place] SL2 transseptal sheath and transseptal access was achieved in a standard fashion using a Brockenbrough needle under biplane fluoroscopy with intracardiac echocardiography confirmation of the transseptal puncture.  Once transseptal access had been achieved, heparin was administered intravenously and intra- arterially in order to maintain an ACT of greater than 350 seconds throughout the procedure.   Pulmonary veinography/ 3D rotation angiography were not performed due to chronic renal failure.  3D Mapping and Ablation: The His bundle catheter was removed and in its place a 3.5 mm [employer] Thermocool ablation catheter was advanced into the right  atrium.  The transseptal sheath was pulled back into the IVC  over a guidewire.  The ablation catheter was advanced across the transseptal hole using the wire as a guide.  The transseptal sheath was then re-advanced over the guidewire into the left atrium.  A duodecapolar Biosense Webster circular mapping catheter was introduced through the transseptal sheath and positioned over the mouth of all 4 pulmonary veins.  Three-dimensional electroanatomical mapping was performed using CARTO technology.  This demonstrated electrical activity within all four pulmonary veins at baseline. The patient underwent successful sequential electrical isolation and anatomical encircling of all four pulmonary veins using radiofrequency current with a circular mapping catheter as a guide.  Complex fractionated electrograms (CFAEs) were then identified and ablated along the roof of the left atrium, base of the left atrial appendage (LAA),  along the ridge between the left superior pulmonary vein and the left atrial appendage, the interatrial septum, along the lateral wall of the left atrium, and above the coronary sinus.    Cardioversion: The patient was then cardioverted to sinus rhythm with a single synchronized 360-J biphasic shock with cardioversion electrodes in the anterior-posterior thoracic configuration. He maintained sinus rhythm initially but then had recurence of afib with catheter manipulation within the left atrial, requiring repeat cardioversion with 360J biphasic.  He remained in sinus rhythm thereafter.  3 mapping and ablation of a second focus The circular mapping catheter was pulled back into the right atrium and 3D mapping was performed at the junction of the superior vena cava and right atrium.  Electrical activity was observed within the SVC.  I therefore elected to perform right atrial ablation in this area.  A series of radiofrequency applications were delivered in a circular fashion around the ostium of the  SVC.  Prior to each ablation lesions, pacing was performed from the distal ablation electrode to insure that diaphragmatic stimulation was not observed to avoid phrenic nerve injury.  Diaphragmatic excursion was also observed during ablation.    Measurements Following Ablation: In sinus rhythm the RR interval was 570 msec, with PR 177 msec, QRS 83 msec, and Qtc 421 msec.  Following ablation the AH interval measured 60 msec with an HV interval of 40 msec. Ventricular pacing was performed, which revealed midline decremental VA conduction with a VA Wenckebach cycle length of less than 370 msec.  Rapid atrial pacing was performed, which revealed an AV Wenckebach cycle length of 320 msec.  Electroisolation was then again confirmed in all four pulmonary veins.  The procedure was therefore considered completed.  All catheters were removed, and the sheaths were aspirated and flushed.  The patient was transferred to the recovery area for sheath removal per protocol.  A limited bedside transthoracic echocardiogram revealed no pericardial effusion.  There were no early apparent complications.  CONCLUSIONS: 1. Atrial fibrillation upon presentation.   2. Successful electrical isolation and anatomical encircling of all four pulmonary veins with radiofrequency current.   3. CFAEs were identified and ablated along the roof of the left atrium, base of the left atrial appendage (LAA),  along the ridge between the left superior pulmonary vein and the left atrial appendage, the interatrial septum, along the lateral wall of the left atrium, and above the coronary sinus.  CFAEs were also ablated within the coronary sinus. The SVC/RA junction also isolated with RF. 4. Atrial fibrillation successfully cardioverted to sinus rhythm. 5. No early apparent complications.   Ural Acree,MD 11:13 AM 12/03/2012

## 2012-12-03 NOTE — Anesthesia Postprocedure Evaluation (Signed)
  Anesthesia Post-op Note  Patient: Charles [name]  Procedure(s) Performed: Procedure(s): ATRIAL FIBRILLATION ABLATION (N/A)  Patient Location: PACU  Anesthesia Type:General  Level of Consciousness: awake, alert , oriented and patient cooperative  Airway and Oxygen Therapy: Patient Spontanous Breathing  Post-op Pain: none  Post-op Assessment: Post-op Vital signs reviewed, Patient's Cardiovascular Status Stable, Respiratory Function Stable, Patent Airway, No signs of Nausea or vomiting and Pain level controlled  Post-op Vital Signs: Reviewed and stable  Complications: No apparent anesthesia complications

## 2012-12-03 NOTE — H&P (View-Only) (Signed)
 Primary Care Physician: DELLINGER,ROBERT, MD Referring Physician:  Dr Brackbill   Charles [name] is a 71 y.o. male with a h/o persistent atrial fibrillation and tachycardia mediated cardiomyopathy who presents today for EP consultation.  He reports initially being diagnosed with atrial fibrillation 2007 after presenting for hip surgery and being found to have afib on the monitor.  He spontaneously converted to sinus rhythm.  He did well until 2010 when he presented for routine physical exam and was again found to have afib.  He was asymptomatic both times.  He reports that he spontaneously converted to sinus rhythm.  In 2012, he presented with symptoms of CHF and was found to have afib.  He underwent cardioversion on August 30, 2010.  He thinks that he stayed in sinus rhythm for a while.  12/13, he presented for routine physical exam and was again found to have afib.  He spontaneously returned to sinus rhythm about 10 days later.  06/22/12, he again developed afib.  He had persistence of afib and required cardioversion 3/14.  He reports that 9 days later he returned to afib.  He is appropriatley anticoagulated with coumadin.  He was placed on amiodarone 12/13 but had recurrence of afib on this medicine.  He has had some thyroid dysfunction with amiodarone as well.   Presently, he reports fatigue and decreased exercise tolerance with his afib.  He reports getting tired with 1 flight of stairs or walking a block.  + SOB with exertion and mild SOB.  Today, he denies symptoms of palpitations, chest pain,  orthopnea, PND, dizziness, presyncope, syncope, or neurologic sequela. + snoring with apnea observed by his spouse.  The patient is tolerating medications without difficulties and is otherwise without complaint today.   Past Medical History  Diagnosis Date  . Persistent atrial fibrillation   . Peripheral edema   . Mitral regurgitation     MILD  . Atrial dilatation     mild biatrial dilitation  .  Nonischemic cardiomyopathy     presumed to be tachycardia mediated  . Hypertension   . Hypercholesterolemia   . Chronic kidney disease     baseline creatinine is 1.8  . GERD (gastroesophageal reflux disease)   . H/O hiatal hernia   . Arthritis    Past Surgical History  Procedure Laterality Date  . Lithotripsy      x 2  . Joint replacement  2005    total right hip replacement  . Hernia repair  03/2005  . Kidney stent  1987, 02/2005  . Quadrec      right leg quad sx  . Cardioversion N/A 09/04/2012    Procedure: CARDIOVERSION;  Surgeon: Thomas Brackbill, MD;  Location: [facility];  Service: Cardiovascular;  Laterality: N/A;    Current Outpatient Prescriptions  Medication Sig Dispense Refill  . Ascorbic Acid (VITAMIN C) 500 MG tablet Take 1,500 mg by mouth daily.        . Cholecalciferol (VITAMIN D) 2000 UNITS CAPS Take 4,000 Units by mouth daily.        . Coenzyme Q10 (COQ10 PO) Take 100 mg by mouth daily.       . enalapril (VASOTEC) 20 MG tablet Take 1 tablet (20 mg total) by mouth daily.  180 tablet  3  . furosemide (LASIX) 40 MG tablet Take 2 tablets (80 mg total) by mouth daily.  180 tablet  3  . guaiFENesin (MUCINEX) 600 MG 12 hr tablet Take 600 mg by mouth 2 (two)   times daily as needed for congestion.      . levothyroxine (SYNTHROID, LEVOTHROID) 50 MCG tablet Take 1 tablet (50 mcg total) by mouth daily.  90 tablet  3  . Magnesium Citrate 100 MG TABS Take 400 mg by mouth daily.      . metolazone (ZAROXOLYN) 5 MG tablet Take 5 mg by mouth as needed.      . metoprolol succinate (TOPROL-XL) 100 MG 24 hr tablet Take 1 tablet (100 mg total) by mouth as directed. TAKE 100 MG IN THE MORNING AND 50 MG IN THE EVENING  225 tablet  3  . Misc Natural Products (OSTEO BI-FLEX JOINT SHIELD PO) Take by mouth daily.        . Multiple Vitamin (MULTIVITAMIN) tablet Take 1 tablet by mouth daily.        . Omega-3 Fatty Acids (FISH OIL) 1000 MG CAPS Take by mouth 2 (two) times daily.        .  omeprazole (PRILOSEC) 20 MG capsule Take 20 mg by mouth daily as needed.      . warfarin (COUMADIN) 5 MG tablet Take 1 tablet (5 mg total) by mouth daily.  90 tablet  3  . zinc gluconate 50 MG tablet Take 50 mg by mouth daily.         No current facility-administered medications for this visit.    Allergies  Allergen Reactions  . Ceftriaxone Sodium     Rocephin  . Diltiazem Hcl     Cardizem  . Penicillins     History   Social History  . Marital Status: Married    Spouse Name: N/A    Number of Children: N/A  . Years of Education: N/A   Occupational History  . Not on file.   Social History Main Topics  . Smoking status: Never Smoker   . Smokeless tobacco: Never Used  . Alcohol Use: No  . Drug Use: No  . Sexually Active: Not on file   Other Topics Concern  . Not on file   Social History Narrative   Lives in [place] with spouse.  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   [employer]    Family History  Problem Relation Age of Onset  . Cancer Mother 83    CANCER  . Heart disease Father 85    HEART PROBLEMS    ROS- All systems are reviewed and negative except as per the HPI above  Physical Exam: Filed Vitals:   11/08/12 1411  BP: 108/82  Pulse: 124  Height: 5' 11" (1.803 m)  Weight: 197 lb 12.8 oz (89.721 kg)    GEN- The patient is well appearing, alert and oriented x 3 today.   Head- normocephalic, atraumatic Eyes-  Sclera clear, conjunctiva pink Ears- hearing intact Oropharynx- clear Neck- supple, no JVP Lymph- no cervical lymphadenopathy Lungs- Clear to ausculation bilaterally, normal work of breathing Heart- tachycardic irregular rhythm, no murmurs, rubs or gallops, PMI not laterally displaced GI- soft, NT, ND, + BS Extremities- no clubbing, cyanosis, or edema MS- no significant deformity or atrophy Skin- no rash or lesion Psych- euthymic mood, full affect Neuro- strength and sensation are intact  EKG today  reveals afib with V rate 124 bpm, nonspecific ST/T changes Echo 08/13/12- LVEF 40%, mild to moderate biatrial enlargement, LA size 45 mm Dr Brackbills recorders are reviewed in detail  Assessment and Plan:  1. Persistent atrial fibrillation The patient has symptomatic persistent atrial fibrillation.    V rates have been very difficult to control, leading to a tachycardia induced cardiomyopathy.  I think that he would clearly do better in sinus rhythm long term, though I am sure that our ability to maintain sinus rhythm is reduced.  He has failed medical therapy with amiodarone. Therapeutic strategies for afib including medicine and ablation were discussed in detail with the patient today. Risk, benefits, and alternatives to EP study and radiofrequency ablation for afib were also discussed in detail today. These risks include but are not limited to stroke, bleeding, vascular damage, tamponade, perforation, damage to the esophagus, lungs, and other structures, pulmonary vein stenosis, worsening renal function, and death. The patient understands these risk and wishes to proceed.  We will therefore proceed with catheter ablation at the next available time.  I will increase toprol to 100mg BID at this time.  2. Nonischemic CM Likely tachycardia mediated We will increase toprol today for better rate control We will try to maintain sinus rhythm as above  3. Snoring/ fatigue/ afib Sleep study is advised and ordered today  4. CRI Baseline creatinine is 1.8 Will try to minimize dye utilization during his planned ablation 

## 2012-12-03 NOTE — Interval H&P Note (Signed)
History and Physical Interval Note:  12/03/2012 7:33 AM  Charles [name]  has presented today for surgery, with the diagnosis of Afib  The various methods of treatment have been discussed with the patient and family. After consideration of risks, benefits and other options for treatment, the patient has consented to  Procedure(s): ATRIAL FIBRILLATION ABLATION (N/A) as a surgical intervention .  The patient's history has been reviewed, patient examined, no change in status, stable for surgery.  I have reviewed the patient's chart and labs.  Questions were answered to the patient's satisfaction.     Hillis Range

## 2012-12-03 NOTE — Anesthesia Procedure Notes (Signed)
Procedure Name: LMA Insertion Date/Time: 12/03/2012 8:32 AM Performed by: Sharlene Dory E Pre-anesthesia Checklist: Patient identified, Emergency Drugs available, Suction available, Patient being monitored and Timeout performed Patient Re-evaluated:Patient Re-evaluated prior to inductionOxygen Delivery Method: Circle system utilized Preoxygenation: Pre-oxygenation with 100% oxygen Intubation Type: IV induction LMA: LMA inserted LMA Size: 4.0 Number of attempts: 1 Placement Confirmation: positive ETCO2 and breath sounds checked- equal and bilateral Tube secured with: Tape Dental Injury: Teeth and Oropharynx as per pre-operative assessment

## 2012-12-03 NOTE — Transfer of Care (Signed)
Immediate Anesthesia Transfer of Care Note  Patient: Charles [name]  Procedure(s) Performed: Procedure(s): ATRIAL FIBRILLATION ABLATION (N/A)  Patient Location: PACU  Anesthesia Type:General  Level of Consciousness: awake, alert  and oriented  Airway & Oxygen Therapy: Patient Spontanous Breathing and Patient connected to nasal cannula oxygen  Post-op Assessment: Report given to PACU RN and Post -op Vital signs reviewed and stable  Post vital signs: Reviewed and stable  Complications: No apparent anesthesia complications

## 2012-12-03 NOTE — Anesthesia Preprocedure Evaluation (Addendum)
Anesthesia Evaluation  Patient identified by MRN, date of birth, ID band Patient awake    Reviewed: Allergy & Precautions, H&P , NPO status , Patient's Chart, lab work & pertinent test results, reviewed documented beta blocker date and time   History of Anesthesia Complications Negative for: history of anesthetic complications  Airway Mallampati: II TM Distance: >3 FB Neck ROM: full    Dental  (+) Caps and Dental Advisory Given   Pulmonary shortness of breath and with exertion, sleep apnea (no CPAP yet) ,  breath sounds clear to auscultation  Pulmonary exam normal       Cardiovascular hypertension, Pt. on medications and Pt. on home beta blockers +CHF and + Orthopnea + dysrhythmias (TEE yest with mod LV dysfunction) Atrial Fibrillation + Valvular Problems/Murmurs Rhythm:Irregular Rate:Normal     Neuro/Psych negative neurological ROS  negative psych ROS   GI/Hepatic Neg liver ROS, hiatal hernia, GERD-  Medicated and Controlled,  Endo/Other  negative endocrine ROSHypothyroidism   Renal/GU CRF and Renal InsufficiencyRenal disease (creat 1.8)  negative genitourinary   Musculoskeletal   Abdominal   Peds  Hematology negative hematology ROS (+)   Anesthesia Other Findings See surgeon's H&P   Reproductive/Obstetrics negative OB ROS                         Anesthesia Physical Anesthesia Plan  ASA: III  Anesthesia Plan: General   Post-op Pain Management:    Induction: Intravenous  Airway Management Planned: LMA  Additional Equipment:   Intra-op Plan:   Post-operative Plan:   Informed Consent: I have reviewed the patients History and Physical, chart, labs and discussed the procedure including the risks, benefits and alternatives for the proposed anesthesia with the patient or authorized representative who has indicated his/her understanding and acceptance.   Dental Advisory Given  Plan  Discussed with: CRNA and Surgeon  Anesthesia Plan Comments: (Plan routine monitors, GA- LMA OK)       Anesthesia Quick Evaluation

## 2012-12-04 ENCOUNTER — Encounter (HOSPITAL_BASED_OUTPATIENT_CLINIC_OR_DEPARTMENT_OTHER): Payer: Medicare Other

## 2012-12-04 DIAGNOSIS — I4891 Unspecified atrial fibrillation: Secondary | ICD-10-CM

## 2012-12-04 LAB — BASIC METABOLIC PANEL
BUN: 31 mg/dL — ABNORMAL HIGH (ref 6–23)
CO2: 27 mEq/L (ref 19–32)
Calcium: 9.1 mg/dL (ref 8.4–10.5)
GFR calc non Af Amer: 41 mL/min — ABNORMAL LOW (ref >90)
Glucose, Bld: 111 mg/dL — ABNORMAL HIGH (ref 70–99)

## 2012-12-04 LAB — PROTIME-INR: INR: 2.52 — ABNORMAL HIGH (ref 0.00–1.49)

## 2012-12-04 MED ORDER — METOPROLOL SUCCINATE ER 100 MG PO TB24: 100.0000 mg | ORAL_TABLET | Freq: Every day | ORAL | Status: DC

## 2012-12-04 NOTE — Progress Notes (Signed)
Patient d/c'd home with wife via wheelchair.  Patient w/o complaint of SOB,CP or discomfort.  PIV d/c'd per protocol w/pressure dressing applied to site.  Discharge instructions given w/all questions addressed and answered.  Teach back done w/patient prior to discharge.  Patient awake, alert and oriented at time of discharge.

## 2012-12-09 ENCOUNTER — Telehealth: Payer: Self-pay | Admitting: Internal Medicine

## 2012-12-09 NOTE — Telephone Encounter (Signed)
New problem  Pt states his heart went into afib again on last Friday.  He said he was advised to call the office when this happens.

## 2012-12-09 NOTE — Telephone Encounter (Signed)
Pt states he is back in afib x 3 days, had ablation 12/03/12. Pt states he feels "great", he would not know if it wasn't for bp/p recording it. HR between 82-142. Pt is not alarmed because he was well informed by dr/nurse that he will go in and out of afib and it is not uncommon. Pt denies sob/ not tired and actually feels very good. Pt was told to call if symptoms occur, he wants to wait to see if his app 6/26 with Dr Shelle Iron will help these episodes of afib due to his apnea. Pt agreed to call with any questions or concerns.

## 2012-12-10 ENCOUNTER — Ambulatory Visit (INDEPENDENT_AMBULATORY_CARE_PROVIDER_SITE_OTHER): Payer: Medicare Other

## 2012-12-10 DIAGNOSIS — Z7901 Long term (current) use of anticoagulants: Secondary | ICD-10-CM

## 2012-12-10 DIAGNOSIS — I4891 Unspecified atrial fibrillation: Secondary | ICD-10-CM

## 2012-12-10 LAB — POCT INR: INR: 2.1

## 2012-12-15 NOTE — Telephone Encounter (Signed)
Tresa Endo, Please call the patient.  If he is still in afib, have him come to our office for an EKG.

## 2012-12-19 ENCOUNTER — Encounter: Payer: Self-pay | Admitting: Pulmonary Disease

## 2012-12-19 ENCOUNTER — Ambulatory Visit (INDEPENDENT_AMBULATORY_CARE_PROVIDER_SITE_OTHER): Payer: Medicare Other | Admitting: Pulmonary Disease

## 2012-12-19 VITALS — BP 100/58 | HR 114 | Temp 96.9°F | Ht 71.0 in | Wt 197.2 lb

## 2012-12-19 DIAGNOSIS — G4731 Primary central sleep apnea: Secondary | ICD-10-CM

## 2012-12-19 DIAGNOSIS — G4730 Sleep apnea, unspecified: Secondary | ICD-10-CM

## 2012-12-19 DIAGNOSIS — G4739 Other sleep apnea: Secondary | ICD-10-CM

## 2012-12-19 NOTE — Assessment & Plan Note (Signed)
The patient has been found to have severe complex apnea that responded very well to CPAP therapy.  I've had a long discussion with him about sleep apnea, including its impact to his cardiovascular health and quality of life.  I think he would benefit from treatment with CPAP, and the patient is agreeable.

## 2012-12-19 NOTE — Progress Notes (Signed)
Subjective:    Patient ID: Charles [name], male    DOB: [birth year], 72 y.o.   MRN: [id number]  HPI The patient is a 72 year old male who I've been asked to see for management of complex sleep apnea.  He underwent a sleep study in May of this year, and was found to have severe complex apnea with an AHI of 45 events per hour.  He was started on CPAP, and had an excellent response with 11 cm of water pressure.  The patient has been noted to have loud snoring, as well as an abnormal breathing pattern during sleep by his wife.  He has frequent awakenings that he blames on nocturia, but feels that he is rested in the mornings upon arising.  He downplays his symptoms during the day, but the wife states that he will fall asleep easily upon sitting.  He also falls asleep in the evening watching television.  He denies any sleepiness with driving.  The patient states that his weight is neutral over the last 2 years, and his Epworth score today is 7.   Sleep Questionnaire What time do you typically go to bed?( Between what hours) 10-11 10-11 at 1133 on 12/19/12 by Nita Sells, CMA How long does it take you to fall asleep? 5-25mins 5-25mins at 1133 on 12/19/12 by Nita Sells, CMA How many times during the night do you wake up? 3 3 at 1133 on 12/19/12 by Nita Sells, CMA What time do you get out of bed to start your day? [id number] [phone] at 1133 on 12/19/12 by Nita Sells, CMA Do you drive or operate heavy machinery in your occupation? No No at 1133 on 12/19/12 by Nita Sells, CMA How much has your weight changed (up or down) over the past two years? (In pounds) 4 lb (1.814 kg)4 lb (1.814 kg) 25lbs increase in fluid--pt has A-fib at 1133 on 12/19/12 by Nita Sells, CMA Have you ever had a sleep study before? Yes Yes at 1133 on 12/19/12 by Nita Sells, CMA If yes, location of study? [facility] at 1133 on 12/19/12 by Nita Sells, CMA If yes, date of study? 11/14/12 11/14/12 at 1133  on 12/19/12 by Nita Sells, CMA Do you currently use CPAP? No No at 1133 on 12/19/12 by Marjo Bicker Mabe, CMA Do you wear oxygen at any time? No No at 1133 on 12/19/12 by Marjo Bicker Mabe, CMA   Review of Systems  Constitutional: Negative for fever and unexpected weight change.  HENT: Negative for ear pain, nosebleeds, congestion, sore throat, rhinorrhea, sneezing, trouble swallowing, dental problem, postnasal drip and sinus pressure.   Eyes: Negative for redness and itching.  Respiratory: Negative for cough, chest tightness, shortness of breath and wheezing.   Cardiovascular: Positive for palpitations ( irregular heartbeats). Negative for leg swelling.  Gastrointestinal: Negative for nausea and vomiting.  Genitourinary: Negative for dysuria.  Musculoskeletal: Negative for joint swelling.  Skin: Positive for rash ( itching--left arm and back --medication reaction).  Neurological: Negative for headaches.  Hematological: Does not bruise/bleed easily.  Psychiatric/Behavioral: Negative for dysphoric mood. The patient is not nervous/anxious.        Objective:   Physical Exam Constitutional:  Well developed, no acute distress  HENT:  Nares patent without discharge  Oropharynx without exudate, palate and uvula are normal  Eyes:  Perrla, eomi, no scleral icterus  Neck:  No JVD, no TMG  Cardiovascular:  Normal rate, irregular rhythm, no rubs  or gallops.  No murmurs        Intact distal pulses  Pulmonary :  Normal breath sounds, no stridor or respiratory distress   No rales, rhonchi, or wheezing  Abdominal:  Soft, nondistended, bowel sounds present.  No tenderness noted.   Musculoskeletal:  No lower extremity edema noted.  Lymph Nodes:  No cervical lymphadenopathy noted  Skin:  No cyanosis noted  Neurologic:  Alert, appropriate, moves all 4 extremities without obvious deficit.         Assessment & Plan:

## 2012-12-19 NOTE — Patient Instructions (Addendum)
Will start on cpap.  Please call if you are having tolerance issues. Work on modest weight loss followup with me in 6 weeks

## 2012-12-23 NOTE — Telephone Encounter (Signed)
lmom for patient that I was calling to check on him.  He could call me back if he wanted but that I was just calling to check on him

## 2012-12-25 ENCOUNTER — Telehealth: Payer: Self-pay | Admitting: Internal Medicine

## 2012-12-25 NOTE — Telephone Encounter (Signed)
He has been out of town and called today.  He is still out of rhythm HR around 115.  He wants to get his CPAP machine and get this started before he goes further with any more procedures with his heart.  I told him I would discuss with [name] and call him back

## 2012-12-25 NOTE — Telephone Encounter (Signed)
New Problem:    Patient called in returning your call. Please call back. 

## 2012-12-26 NOTE — Telephone Encounter (Signed)
[name] agrees that if patient still out of rhythm after getting regulated of his CPAP he will call me back and come in for EKG if in afib will set up for a DCCV

## 2013-01-07 ENCOUNTER — Ambulatory Visit (INDEPENDENT_AMBULATORY_CARE_PROVIDER_SITE_OTHER): Payer: Medicare Other | Admitting: *Deleted

## 2013-01-07 DIAGNOSIS — I4891 Unspecified atrial fibrillation: Secondary | ICD-10-CM

## 2013-01-07 DIAGNOSIS — Z7901 Long term (current) use of anticoagulants: Secondary | ICD-10-CM

## 2013-01-21 ENCOUNTER — Ambulatory Visit (INDEPENDENT_AMBULATORY_CARE_PROVIDER_SITE_OTHER): Payer: Medicare Other | Admitting: Cardiology

## 2013-01-21 ENCOUNTER — Encounter: Payer: Self-pay | Admitting: Cardiology

## 2013-01-21 VITALS — BP 116/70 | HR 141 | Ht 71.0 in | Wt 197.4 lb

## 2013-01-21 DIAGNOSIS — I10 Essential (primary) hypertension: Secondary | ICD-10-CM

## 2013-01-21 DIAGNOSIS — I509 Heart failure, unspecified: Secondary | ICD-10-CM

## 2013-01-21 DIAGNOSIS — I4891 Unspecified atrial fibrillation: Secondary | ICD-10-CM

## 2013-01-21 DIAGNOSIS — I5023 Acute on chronic systolic (congestive) heart failure: Secondary | ICD-10-CM

## 2013-01-21 DIAGNOSIS — Z79899 Other long term (current) drug therapy: Secondary | ICD-10-CM

## 2013-01-21 NOTE — Assessment & Plan Note (Signed)
The patient is euvolemic at this time.  He is taking Zaroxolyn twice a week and continues on 80 mg of Lasix daily

## 2013-01-21 NOTE — Assessment & Plan Note (Signed)
EKG today shows atrial fibrillation with a ventricular response of 141.  The patient states that his heart rate is quite variable and at home he will often have a heart rate in the 90s when he is relaxed.  He does have a history of whitecoat syndrome.  He is anxious to be cardioverted again.  He is off of that now that his sleep apnea problem has been corrected that his likelihood of staying in sinus rhythm is improved, as well as improved because of the radiofrequency ablation last month on 12/03/12. We will await his followup appointment with Dr. Shelle Iron to be sure that his CPAP machine is optimized.  We will tentatively schedule him for outpatient direct current cardioversion on Tuesday, August 19 at 1 PM.  He will come to the office the day before for preoperative lab work.  We will plan to see him post cardioversion in early September.  He remains on long-term warfarin anticoagulation.

## 2013-01-21 NOTE — Patient Instructions (Addendum)
Your physician recommends that you continue on your current medications as directed. Please refer to the Current Medication list given to you today  Return on August 18 for labs, eat and drink whatever you want that am  You are scheduled for a cardioversion on August the 19 arrive at Short Stay at 11:00   Nothing to eat or drink morning of procedure   No Lasix morning of  Electrical Cardioversion Cardioversion is the delivery of a jolt of electricity to change the rhythm of the heart. Sticky patches or metal paddles are placed on the chest to deliver the electricity from a special device. This is done to restore a normal rhythm. A rhythm that is too fast or not regular keeps the heart from pumping well. Compared to medicines used to change an abnormal rhythm, cardioversion is faster and works better. It is also unpleasant and may dislodge blood clots from the heart. WHEN WOULD THIS BE DONE?  In an emergency:  There is low or no blood pressure as a result of the heart rhythm.  Normal rhythm must be restored as fast as possible to protect the brain and heart from further damage.  It may save a life.  For less serious heart rhythms, such as atrial fibrillation or flutter, in which:  The heart is beating too fast or is not regular.  The heart is still able to pump enough blood, but not as well as it should.  Medicine to change the rhythm has not worked.  It is safe to wait in order to allow time for preparation. LET YOUR CAREGIVER KNOW ABOUT:   Every medicine you are taking. It is very important to do this! Know when to take or stop taking any of them.  Any time in the past that you have felt your heart was not beating normally. RISKS AND COMPLICATIONS   Clots may form in the chambers of the heart if it is beating too fast. These clots may be dislodged during the procedure and travel to other parts of the body.  There is risk of a stroke during and after the procedure if a clot  moves. Blood thinners lower this risk.  You may have a special test of your heart (TEE) to make sure there are no clots in your heart. BEFORE THE PROCEDURE   You may have some tests to see how well your heart is working.  You may start taking blood thinners so your blood does not clot as easily.  Other drugs may be given to help your heart work better. PROCEDURE (SCHEDULED)  The procedure is typically done in a hospital by a heart doctor (cardiologist).  You will be told when and where to go.  You may be given some medicine through an intravenous (IV) access to reduce discomfort and make you sleepy before the procedure.  Your whole body may move when the shock is delivered. Your chest may feel sore.  You may be able to go home after a few hours. Your heart rhythm will be watched to make sure it does not change. HOME CARE INSTRUCTIONS   Only take medicine as directed by your caregiver. Be sure you understand how and when to take your medicine.  Learn how to feel your pulse and check it often.  Limit your activity for 48 hours.  Avoid caffeine and other stimulants as directed. SEEK MEDICAL CARE IF:   You feel like your heart is beating too fast or your pulse is not regular.  You have any questions about your medicines.  You have bleeding that will not stop. SEEK IMMEDIATE MEDICAL CARE IF:   You are dizzy or feel faint.  It is hard to breathe or you feel short of breath.  There is a change in discomfort in your chest.  Your speech is slurred or you have trouble moving your arm or leg on one side.  You get a muscle cramp.  Your fingers or toes turn cold or blue. MAKE SURE YOU:   Understand these instructions.  Will watch your condition.  Will get help right away if you are not doing well or get worse. Document Released: 06/02/2002 Document Revised: 09/04/2011 Document Reviewed: 10/02/2007 [facility] Patient Information 2014 [place], [place].

## 2013-01-21 NOTE — Progress Notes (Signed)
Charles [name] Date of Birth:  06/20/1941 [name] [address] [place], [place]  [address] [phone]         Fax   [phone]  History of Present Illness: This pleasant 72 year old is seen for a scheduled followup office visit.  He has a history of recurrent atrial fibrillation.  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  After cardioversion he showed significant clinical improvement in his ejection fraction on 10/24/11 while in normal sinus rhythm was 55-60%.  Approximately 8 months ago the patient went back into atrial fibrillation with a rapid rate.  An echocardiogram on 08/13/12 showed that his ejection fraction had dropped to 40%.  He underwent a second cardioversion on 09/04/12 and converted to normal sinus rhythm.  However by 09/13/12 he was back in atrial fibrillation.  He was referred to Dr. [name] and on 12/03/2012 underwent a radiofrequency ablation by Dr. [name].  At the conclusion of the test he was cardioverted into normal sinus rhythm but his cardioversion lasted only 3 days before he went back into atrial fibrillation on about 12/06/2012.  He has remained in atrial fibrillation since that time.  The patient has a history of sleep apnea and has been using a CPAP machine for the past 2-3 weeks.  His wife states that since he started using a CPAP machine his sleep was much more restful, and the patient agrees.  He is tolerating the CPAP well.  He has a followup appointment scheduled with Dr. Shelle Iron on 02/05/13.  His next scheduled visit with Dr. [name] is 03/03/2013.  Current Outpatient Prescriptions  Medication Sig Dispense Refill  . Ascorbic Acid (VITAMIN C) 500 MG tablet Take 1,500 mg by mouth daily.        . Cholecalciferol (VITAMIN D) 2000 UNITS CAPS Take 4,000 Units by mouth daily.        . Coenzyme Q10 (COQ10 PO) Take 100 mg by mouth daily.       .  enalapril (VASOTEC) 20 MG tablet Take 20 mg by mouth daily.      . furosemide (LASIX) 40 MG tablet Take 80 mg by mouth daily.      Marland Kitchen guaiFENesin (MUCINEX) 600 MG 12 hr tablet Take 600 mg by mouth 2 (two) times daily as needed for congestion.      Marland Kitchen levothyroxine (SYNTHROID, LEVOTHROID) 50 MCG tablet Take 50 mcg by mouth daily before breakfast.      . Magnesium 200 MG TABS Take 1 tablet by mouth 2 (two) times daily. Magnesium Citrate      . metolazone (ZAROXOLYN) 5 MG tablet Take 5 mg by mouth as needed (for swelling).       . metoprolol succinate (TOPROL-XL) 100 MG 24 hr tablet Take 1 tablet (100 mg total) by mouth daily.  90 tablet  1  . Misc Natural Products (OSTEO BI-FLEX JOINT SHIELD PO) Take 2 tablets by mouth daily.       . Multiple Vitamin (MULTIVITAMIN) tablet Take 1 tablet by mouth daily.        . Omega-3 Fatty Acids (FISH OIL) 1000 MG CAPS Take 1 capsule by mouth 2 (two) times daily.       Marland Kitchen omeprazole (PRILOSEC) 20 MG capsule Take 20 mg by mouth daily as needed (for acid reflux).       . warfarin (COUMADIN) 5 MG tablet Take 5-7.5 mg by mouth daily. Takes 5mg   every day, except takes 7.5mg  on mondays      . zinc gluconate 50 MG tablet Take 50 mg by mouth daily.         No current facility-administered medications for this visit.    Allergies  Allergen Reactions  . Ceftriaxone Sodium Rash    Rocephin  . Diltiazem Hcl Rash    Cardizem  . Penicillins Rash    Patient Active Problem List   Diagnosis Date Noted  . Atrial fibrillation     Priority: High  . CHF (congestive heart failure)     Priority: High  . Hypertension     Priority: High  . Complex sleep apnea syndrome 11/21/2012  . Nonischemic cardiomyopathy 11/09/2012  . Snoring 11/09/2012  . Hypothyroidism 08/26/2012  . Long term (current) use of anticoagulants 08/01/2012  . Tremor 10/24/2011  . Peripheral edema   . Swelling   . Mitral regurgitation   . Orthopnea   . Dyspnea   . Hypercholesterolemia   . Chronic  renal insufficiency   . HYPERTENSION 11/07/2006  . GERD 11/07/2006  . ABDOMINAL WALL HERNIA 11/07/2006  . OSTEOARTHROSIS NOS, UNSPECIFIED SITE 11/07/2006  . ABRASION, LOWER EXTREMITY W/INFECTION 11/07/2006  . NEPHROLITHIASIS, HX OF 11/07/2006  . HIP REPLACEMENT, TOTAL, HX OF 11/07/2006    History  Smoking status  . Never Smoker   Smokeless tobacco  . Never Used    History  Alcohol Use No    Family History  Problem Relation Age of Onset  . Cancer Mother 59    CANCER  . Heart disease Father 59    HEART PROBLEMS    Review of Systems: Constitutional: no fever chills diaphoresis or fatigue or change in weight.  Head and neck: no hearing loss, no epistaxis, no photophobia or visual disturbance. Respiratory: No cough, shortness of breath or wheezing. Cardiovascular: No chest pain peripheral edema, palpitations. Gastrointestinal: No abdominal distention, no abdominal pain, no change in bowel habits hematochezia or melena. Genitourinary: No dysuria, no frequency, no urgency, no nocturia. Musculoskeletal:No arthralgias, no back pain, no gait disturbance or myalgias. Neurological: No dizziness, no headaches, no numbness, no seizures, no syncope, no weakness, no tremors. Hematologic: No lymphadenopathy, no easy bruising. Psychiatric: No confusion, no hallucinations, no sleep disturbance.    Physical Exam: Filed Vitals:   01/21/13 0939  BP: 116/70  Pulse: 141   the general appearance reveals a well-developed well-nourished gentleman in no distress.The head and neck exam reveals pupils equal and reactive.  Extraocular movements are full.  There is no scleral icterus.  The mouth and pharynx are normal.  The neck is supple.  The carotids reveal no bruits.  The jugular venous pressure is normal.  The  thyroid is not enlarged.  There is no lymphadenopathy.  The chest is clear to percussion and auscultation.  There are no rales or rhonchi.  Expansion of the chest is symmetrical.  The  precordium is quiet.  The pulse is rapid and irregular.  The first heart sound is normal.  The second heart sound is physiologically split.  There is no murmur gallop rub or click.  There is no abnormal lift or heave.  The abdomen is soft and nontender.  The bowel sounds are normal.  The liver and spleen are not enlarged.  There are no abdominal masses.  There are no abdominal bruits.  Extremities reveal good pedal pulses.  There is no phlebitis or edema.  There is no cyanosis or clubbing.  Strength is normal and symmetrical  in all extremities.  There is no lateralizing weakness.  There are no sensory deficits.  The skin is warm and dry.  There is no rash.  EKG shows atrial fibrillation with rapid ventricular response.  There are nonspecific inferolateral ST-T wave abnormalities which have increased since 12/04/12.  Since 12/04/12, atrial fibrillation has recurred.   Assessment / Plan: Continue same medication.  Continue long-term.  Plan for elective cardioversion on Tuesday, August 19 at 1 PM

## 2013-01-21 NOTE — Assessment & Plan Note (Signed)
Blood pressures remained on current therapy.  No dizziness or syncope

## 2013-01-29 ENCOUNTER — Other Ambulatory Visit: Payer: Self-pay

## 2013-02-05 ENCOUNTER — Encounter: Payer: Self-pay | Admitting: Pulmonary Disease

## 2013-02-05 ENCOUNTER — Ambulatory Visit (INDEPENDENT_AMBULATORY_CARE_PROVIDER_SITE_OTHER): Payer: Medicare Other | Admitting: Pulmonary Disease

## 2013-02-05 ENCOUNTER — Other Ambulatory Visit (INDEPENDENT_AMBULATORY_CARE_PROVIDER_SITE_OTHER): Payer: Medicare Other

## 2013-02-05 ENCOUNTER — Ambulatory Visit (INDEPENDENT_AMBULATORY_CARE_PROVIDER_SITE_OTHER): Payer: Medicare Other | Admitting: *Deleted

## 2013-02-05 VITALS — BP 122/80 | Temp 97.5°F | Ht 71.0 in | Wt 202.0 lb

## 2013-02-05 DIAGNOSIS — I4891 Unspecified atrial fibrillation: Secondary | ICD-10-CM

## 2013-02-05 DIAGNOSIS — G4731 Primary central sleep apnea: Secondary | ICD-10-CM

## 2013-02-05 DIAGNOSIS — G4739 Other sleep apnea: Secondary | ICD-10-CM

## 2013-02-05 DIAGNOSIS — Z7901 Long term (current) use of anticoagulants: Secondary | ICD-10-CM

## 2013-02-05 DIAGNOSIS — G4730 Sleep apnea, unspecified: Secondary | ICD-10-CM

## 2013-02-05 DIAGNOSIS — Z79899 Other long term (current) drug therapy: Secondary | ICD-10-CM

## 2013-02-05 LAB — POCT INR: INR: 3.1

## 2013-02-05 LAB — CBC WITH DIFFERENTIAL/PLATELET
Basophils Relative: 0.5 % (ref 0.0–3.0)
HCT: 40.1 % (ref 39.0–52.0)
Hemoglobin: 13 g/dL (ref 13.0–17.0)
Lymphocytes Relative: 20.7 % (ref 12.0–46.0)
Lymphs Abs: 1.5 10*3/uL (ref 0.7–4.0)
Monocytes Relative: 8.2 % (ref 3.0–12.0)
Neutro Abs: 5 10*3/uL (ref 1.4–7.7)
RBC: 4.57 Mil/uL (ref 4.22–5.81)
RDW: 15.4 % — ABNORMAL HIGH (ref 11.5–14.6)

## 2013-02-05 LAB — BASIC METABOLIC PANEL
CO2: 33 mEq/L — ABNORMAL HIGH (ref 19–32)
Calcium: 9.5 mg/dL (ref 8.4–10.5)
GFR: 32.08 mL/min — ABNORMAL LOW (ref >60.00)
Glucose, Bld: 110 mg/dL — ABNORMAL HIGH (ref 70–99)
Potassium: 3.6 mEq/L (ref 3.5–5.1)
Sodium: 139 mEq/L (ref 135–145)

## 2013-02-05 LAB — APTT: aPTT: 36.8 s — ABNORMAL HIGH (ref 21.7–28.8)

## 2013-02-05 NOTE — Progress Notes (Signed)
  Subjective:    Patient ID: Charles [name], male    DOB: 02/14/1941, 72 y.o.   MRN: [id number]  HPI The patient comes in today for followup of his complex sleep apnea.  He was started on CPAP at the last visit, and is seen definite improvement in his sleep.  His wife has not witnessed breakthrough events as long as he wears his device.  The patient has no issues with his mask fit or pressure at this time.  He has not necessarily seen an improvement in his daytime alertness.  The patient has been having issues with poor control nature for relation, and he thinks this contributes to some of his daytime sleepiness issues.  He also has a history of leg jerks, but it is unclear whether this is contributing to some of his symptomatology.   Review of Systems  Constitutional: Negative for fever and unexpected weight change.  HENT: Negative for ear pain, nosebleeds, congestion, sore throat, rhinorrhea, sneezing, trouble swallowing, dental problem, postnasal drip and sinus pressure.   Eyes: Negative for redness and itching.  Respiratory: Negative for cough, chest tightness, shortness of breath and wheezing.   Cardiovascular: Negative for palpitations and leg swelling.  Gastrointestinal: Negative for nausea and vomiting.  Genitourinary: Negative for dysuria.  Musculoskeletal: Negative for joint swelling.  Skin: Negative for rash.  Neurological: Negative for headaches.  Hematological: Does not bruise/bleed easily.  Psychiatric/Behavioral: Negative for dysphoric mood. The patient is not nervous/anxious.        Objective:   Physical Exam Well-developed male in no acute distress Nose without purulence or discharge noted No skin breakdown or pressure necrosis with a CPAP mask Neck without lymphadenopathy or thyromegaly Lower extremities without edema, no cyanosis Alert, does not appear to be sleepy, moves all 4 extremities.       Assessment & Plan:

## 2013-02-05 NOTE — Progress Notes (Signed)
Quick Note:  Please report to patient. The recent labs are stable. Continue same medication and careful diet. Labs okay for cardioversion. Kidneys drier so drink more water. ______

## 2013-02-05 NOTE — Assessment & Plan Note (Signed)
The patient is doing very well with CPAP, and has distally seen an improvement in his sleep.  This has yet to translate into improved daytime alertness, but he is concerned this is related to his poorly-controlled atrial fibrillation.  He is due for cardioversion next week.  I have asked him to continue on CPAP, and to keep up with his mask changes and supplies.  I will get a download off his machine to make sure that we are adequately controlling the central component of his sleep apnea.

## 2013-02-05 NOTE — Patient Instructions (Addendum)
Continue on cpap, and work on modest weight loss Will see you back in 6mos if doing well.

## 2013-02-07 ENCOUNTER — Telehealth: Payer: Self-pay | Admitting: *Deleted

## 2013-02-07 NOTE — Telephone Encounter (Signed)
Advised patient of lab results  

## 2013-02-07 NOTE — Telephone Encounter (Signed)
Message copied by Burnell Blanks on Fri Feb 07, 2013  1:21 PM ------      Message from: [name]      Created: Wed Feb 05, 2013  9:17 PM       Please report to patient.  The recent labs are stable. Continue same medication and careful diet. Labs okay for cardioversion. Kidneys drier so drink more water. ------

## 2013-02-10 ENCOUNTER — Other Ambulatory Visit: Payer: Medicare Other

## 2013-02-11 ENCOUNTER — Ambulatory Visit (HOSPITAL_COMMUNITY): Payer: Medicare Other | Admitting: Anesthesiology

## 2013-02-11 ENCOUNTER — Encounter (HOSPITAL_COMMUNITY)
Admission: RE | Disposition: A | Discharge status: Home / Self Care | Payer: Self-pay | Source: Ambulatory Visit | Attending: Cardiology

## 2013-02-11 ENCOUNTER — Ambulatory Visit (HOSPITAL_COMMUNITY)
Admission: RE | Admit: 2013-02-11 | Discharge: 2013-02-11 | Disposition: A | Discharge status: Home / Self Care | Payer: Medicare Other | Source: Ambulatory Visit | Attending: Cardiology | Admitting: Cardiology

## 2013-02-11 ENCOUNTER — Encounter (HOSPITAL_COMMUNITY): Payer: Self-pay | Admitting: Gastroenterology

## 2013-02-11 ENCOUNTER — Ambulatory Visit (INDEPENDENT_AMBULATORY_CARE_PROVIDER_SITE_OTHER): Payer: Medicare Other | Admitting: Pharmacist

## 2013-02-11 ENCOUNTER — Encounter (HOSPITAL_COMMUNITY): Payer: Self-pay | Admitting: Anesthesiology

## 2013-02-11 DIAGNOSIS — G4730 Sleep apnea, unspecified: Secondary | ICD-10-CM | POA: Insufficient documentation

## 2013-02-11 DIAGNOSIS — I4891 Unspecified atrial fibrillation: Secondary | ICD-10-CM

## 2013-02-11 DIAGNOSIS — E039 Hypothyroidism, unspecified: Secondary | ICD-10-CM | POA: Insufficient documentation

## 2013-02-11 DIAGNOSIS — I428 Other cardiomyopathies: Secondary | ICD-10-CM | POA: Insufficient documentation

## 2013-02-11 DIAGNOSIS — Z7901 Long term (current) use of anticoagulants: Secondary | ICD-10-CM

## 2013-02-11 DIAGNOSIS — I10 Essential (primary) hypertension: Secondary | ICD-10-CM | POA: Insufficient documentation

## 2013-02-11 DIAGNOSIS — R0601 Orthopnea: Secondary | ICD-10-CM | POA: Insufficient documentation

## 2013-02-11 DIAGNOSIS — I509 Heart failure, unspecified: Secondary | ICD-10-CM | POA: Insufficient documentation

## 2013-02-11 DIAGNOSIS — N289 Disorder of kidney and ureter, unspecified: Secondary | ICD-10-CM | POA: Insufficient documentation

## 2013-02-11 HISTORY — PX: CARDIOVERSION: SHX1299

## 2013-02-11 LAB — POCT INR: INR: 2.9

## 2013-02-11 SURGERY — CARDIOVERSION
Anesthesia: Monitor Anesthesia Care

## 2013-02-11 MED ORDER — PROPOFOL 10 MG/ML IV BOLUS
INTRAVENOUS | Status: DC | PRN
  Administered 2013-02-11: 80 mg via INTRAVENOUS

## 2013-02-11 MED ORDER — SODIUM CHLORIDE 0.9 % IV SOLN
INTRAVENOUS | Status: DC
  Administered 2013-02-11: 500 mL via INTRAVENOUS

## 2013-02-11 MED ORDER — LIDOCAINE HCL (CARDIAC) 20 MG/ML IV SOLN
INTRAVENOUS | Status: DC | PRN
  Administered 2013-02-11: 30 mg via INTRAVENOUS

## 2013-02-11 NOTE — CV Procedure (Signed)
Electrical Cardioversion Procedure Note Charles [name] [id number] September 29, 1940  Procedure: Electrical Cardioversion Indications:  Atrial Fibrillation  Procedure Details Consent: Risks of procedure as well as the alternatives and risks of each were explained to the (patient/caregiver).  Consent for procedure obtained. Time Out: Verified patient identification, verified procedure, site/side was marked, verified correct patient position, special equipment/implants available, medications/allergies/relevent history reviewed, required imaging and test results available.  Performed  Patient placed on cardiac monitor, pulse oximetry, supplemental oxygen as necessary.  Sedation given: propofol 80 Pacer pads placed anterior and posterior chest.  Cardioverted 1 time(s).  Cardioverted at 120J.  Evaluation Findings: Post procedure EKG shows: NSR Complications: None Patient did tolerate procedure well.   [name] 02/11/2013, 11:51 AM

## 2013-02-11 NOTE — Transfer of Care (Signed)
Immediate Anesthesia Transfer of Care Note  Patient: Charles [name]  Procedure(s) Performed: Procedure(s): CARDIOVERSION (N/A)  Patient Location: Endoscopy Unit  Anesthesia Type:MAC  Level of Consciousness: awake  Airway & Oxygen Therapy: Patient Spontanous Breathing and Patient connected to nasal cannula oxygen  Post-op Assessment: Report given to PACU RN and Post -op Vital signs reviewed and stable  Post vital signs: Reviewed and stable  Complications: No apparent anesthesia complications

## 2013-02-11 NOTE — Anesthesia Preprocedure Evaluation (Signed)
Anesthesia Evaluation  Patient identified by MRN, date of birth, ID band Patient awake    Reviewed: Allergy & Precautions, H&P , NPO status , Patient's Chart, lab work & pertinent test results, reviewed documented beta blocker date and time   Airway Mallampati: II TM Distance: >3 FB Neck ROM: Full    Dental  (+) Teeth Intact and Dental Advisory Given   Pulmonary sleep apnea and Continuous Positive Airway Pressure Ventilation ,          Cardiovascular hypertension, Pt. on medications +CHF and + Orthopnea + dysrhythmias     Neuro/Psych    GI/Hepatic GERD-  ,  Endo/Other  Hypothyroidism   Renal/GU Renal InsufficiencyRenal disease     Musculoskeletal   Abdominal   Peds  Hematology   Anesthesia Other Findings   Reproductive/Obstetrics                           Anesthesia Physical Anesthesia Plan  ASA: III  Anesthesia Plan:    Post-op Pain Management:    Induction: Intravenous  Airway Management Planned: Mask  Additional Equipment:   Intra-op Plan:   Post-operative Plan:   Informed Consent: I have reviewed the patients History and Physical, chart, labs and discussed the procedure including the risks, benefits and alternatives for the proposed anesthesia with the patient or authorized representative who has indicated his/her understanding and acceptance.   Dental advisory given  Plan Discussed with: CRNA, Anesthesiologist and Surgeon  Anesthesia Plan Comments:         Anesthesia Quick Evaluation

## 2013-02-11 NOTE — H&P (Signed)
This 72 year old man presents for elective outpatient cardioversion. Cardiac history is summarize below:  This pleasant 72 year old is seen for a scheduled followup office visit. He has a history of recurrent atrial fibrillation. 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 After cardioversion he showed significant clinical improvement in his ejection fraction on 10/24/11 while in normal sinus rhythm was 55-60%. Approximately 8 months ago the patient went back into atrial fibrillation with a rapid rate. An echocardiogram on 08/13/12 showed that his ejection fraction had dropped to 40%. He underwent a second cardioversion on 09/04/12 and converted to normal sinus rhythm. However by 09/13/12 he was back in atrial fibrillation. He was referred to Dr. [name] and on 12/03/2012 underwent a radiofrequency ablation by Dr. [name]. At the conclusion of the test he was cardioverted into normal sinus rhythm but his cardioversion lasted only 3 days before he went back into atrial fibrillation on about 12/06/2012. He has remained in atrial fibrillation since that time. The patient has a history of sleep apnea and has been using a CPAP machine for the past 2-3 weeks. His wife states that since he started using a CPAP machine his sleep was much more restful, and the patient agrees. He is tolerating the CPAP well. Marland Kitchen His next scheduled visit with Dr. [name] is 03/03/2013.

## 2013-02-11 NOTE — Preoperative (Signed)
Beta Blockers   Reason not to administer Beta Blockers:Not Applicable 

## 2013-02-11 NOTE — Anesthesia Postprocedure Evaluation (Signed)
  Anesthesia Post-op Note  Patient: Charles [name]  Procedure(s) Performed: Procedure(s): CARDIOVERSION (N/A)  Patient Location: Endoscopy Unit  Anesthesia Type:MAC  Level of Consciousness: awake, alert  and oriented  Airway and Oxygen Therapy: Patient Spontanous Breathing and Patient connected to nasal cannula oxygen  Post-op Pain: none  Post-op Assessment: Post-op Vital signs reviewed and Patient's Cardiovascular Status Stable  Post-op Vital Signs: Reviewed and stable  Complications: No apparent anesthesia complications

## 2013-02-12 ENCOUNTER — Encounter (HOSPITAL_COMMUNITY): Payer: Self-pay | Admitting: Cardiology

## 2013-02-26 ENCOUNTER — Encounter: Payer: Self-pay | Admitting: Cardiology

## 2013-02-26 ENCOUNTER — Ambulatory Visit (INDEPENDENT_AMBULATORY_CARE_PROVIDER_SITE_OTHER): Payer: Medicare Other | Admitting: Cardiology

## 2013-02-26 VITALS — BP 120/64 | HR 127 | Ht 71.0 in | Wt 195.4 lb

## 2013-02-26 DIAGNOSIS — I480 Paroxysmal atrial fibrillation: Secondary | ICD-10-CM

## 2013-02-26 DIAGNOSIS — G4731 Primary central sleep apnea: Secondary | ICD-10-CM

## 2013-02-26 DIAGNOSIS — G4730 Sleep apnea, unspecified: Secondary | ICD-10-CM

## 2013-02-26 DIAGNOSIS — I428 Other cardiomyopathies: Secondary | ICD-10-CM

## 2013-02-26 DIAGNOSIS — G4739 Other sleep apnea: Secondary | ICD-10-CM

## 2013-02-26 DIAGNOSIS — I4891 Unspecified atrial fibrillation: Secondary | ICD-10-CM

## 2013-02-26 MED ORDER — OMEPRAZOLE 20 MG PO CPDR: 20.0000 mg | DELAYED_RELEASE_CAPSULE | Freq: Every day | ORAL | Status: DC | PRN

## 2013-02-26 MED ORDER — METOPROLOL SUCCINATE ER 100 MG PO TB24: ORAL_TABLET | ORAL | Status: DC

## 2013-02-26 NOTE — Patient Instructions (Addendum)
DECREASE VASOTEC (ENALAPRIL) TO 10 MG DAILY  INCREASE TOPROL (METOPROLOL) TO 100 MG IN THE MORNING AND 50 MG IN THE EVENING AFTER SUPPER  Your physician recommends that you schedule a follow-up appointment in: 2 MONTH OV/EKG

## 2013-02-26 NOTE — Assessment & Plan Note (Signed)
The patient remains on Lasix 80 mg daily.  He has not been experiencing any significant exertional dyspnea.  His previous peripheral edema has resolved.

## 2013-02-26 NOTE — Assessment & Plan Note (Signed)
The patient is now back in atrial fibrillation.  His ventricular response is rapid on current dose of Toprol 100 mg daily.  We will increase his Toprol up to 100 mg in the morning and 50 mg in the evening.

## 2013-02-26 NOTE — Assessment & Plan Note (Signed)
Overall the patient feels more refreshed and is not experiencing any unusual fatigue since starting his CPAP machine.

## 2013-02-26 NOTE — Progress Notes (Signed)
Charles [name] Date of Birth:  1940-11-05 [facility] [address] [place], [place]  [address] [phone]         Fax   [phone]  History of Present Illness: This pleasant 72 year old is seen for a scheduled followup office visit. He has a history of recurrent atrial fibrillation. 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 After cardioversion he showed significant clinical improvement in his ejection fraction on 10/24/11 while in normal sinus rhythm was 55-60%. Approximately 8 months ago the patient went back into atrial fibrillation with a rapid rate. An echocardiogram on 08/13/12 showed that his ejection fraction had dropped to 40%. He underwent a second cardioversion on 09/04/12 and converted to normal sinus rhythm. However by 09/13/12 he was back in atrial fibrillation. He was referred to Dr. [name] and on 12/03/2012 underwent a radiofrequency ablation by Dr. [name]. At the conclusion of the test he was cardioverted into normal sinus rhythm but his cardioversion lasted only 3 days before he went back into atrial fibrillation on about 12/06/2012.  The patient underwent elective direct current cardioversion on 02/11/13.  This was his fourth cardioversion.  Unfortunately he stayed in sinus rhythm only about 10 days before he recognized that he was back in atrial fibrillation with rapid ventricular response.. The patient has a history of sleep apnea and has been using a CPAP machine for the past 2-3 weeks. His wife states that since he started using a CPAP machine his sleep was much more restful, and the patient agrees. He is tolerating the CPAP well.  His next scheduled visit with Dr. [name] is 03/03/2013.    Current Outpatient Prescriptions  Medication Sig Dispense Refill  . Ascorbic Acid (VITAMIN C) 500 MG tablet Take 1,500 mg by mouth daily.        . Cholecalciferol  (VITAMIN D) 2000 UNITS CAPS Take 4,000 Units by mouth daily.        . Coenzyme Q10 (COQ10 PO) Take 100 mg by mouth daily.       . enalapril (VASOTEC) 20 MG tablet Take 20 mg by mouth as directed. 1/2 TABLET DAILY      . furosemide (LASIX) 40 MG tablet Take 80 mg by mouth daily.      Marland Kitchen guaiFENesin (MUCINEX) 600 MG 12 hr tablet Take 600 mg by mouth 2 (two) times daily as needed for congestion.      Marland Kitchen levothyroxine (SYNTHROID, LEVOTHROID) 50 MCG tablet Take 50 mcg by mouth daily before breakfast.      . Magnesium 200 MG TABS Take 1 tablet by mouth 2 (two) times daily. Magnesium Citrate      . metolazone (ZAROXOLYN) 5 MG tablet Take 5 mg by mouth as needed (for swelling).       . metoprolol succinate (TOPROL-XL) 100 MG 24 hr tablet 1 TABLET IN THE MORNING AND 1/2 IN THE EVENING AFTER SUPPER  135 tablet  3  . Misc Natural Products (OSTEO BI-FLEX JOINT SHIELD PO) Take 2 tablets by mouth daily.       . Multiple Vitamin (MULTIVITAMIN) tablet Take 1 tablet by mouth daily.        . Omega-3 Fatty Acids (FISH OIL) 1000 MG CAPS Take 1 capsule by mouth 2 (two) times daily.       Marland Kitchen omeprazole (PRILOSEC) 20 MG capsule Take 1 capsule (20 mg total) by mouth daily as needed (for acid reflux).  90 capsule  3  . warfarin (COUMADIN) 5 MG tablet Take 5-7.5 mg by mouth daily. Takes 5mg  every day, except takes 7.5mg  on mondays      . zinc gluconate 50 MG tablet Take 50 mg by mouth daily.         No current facility-administered medications for this visit.    Allergies  Allergen Reactions  . Ceftriaxone Sodium Rash    Rocephin  . Diltiazem Hcl Rash    Cardizem  . Penicillins Rash    Patient Active Problem List   Diagnosis Date Noted  . Atrial fibrillation     Priority: High  . CHF (congestive heart failure)     Priority: High  . Hypertension     Priority: High  . Complex sleep apnea syndrome 11/21/2012  . Nonischemic cardiomyopathy 11/09/2012  . Snoring 11/09/2012  . Hypothyroidism 08/26/2012  . Long  term (current) use of anticoagulants 08/01/2012  . Tremor 10/24/2011  . Peripheral edema   . Swelling   . Mitral regurgitation   . Orthopnea   . Dyspnea   . Hypercholesterolemia   . Chronic renal insufficiency   . HYPERTENSION 11/07/2006  . GERD 11/07/2006  . ABDOMINAL WALL HERNIA 11/07/2006  . OSTEOARTHROSIS NOS, UNSPECIFIED SITE 11/07/2006  . ABRASION, LOWER EXTREMITY W/INFECTION 11/07/2006  . NEPHROLITHIASIS, HX OF 11/07/2006  . HIP REPLACEMENT, TOTAL, HX OF 11/07/2006    History  Smoking status  . Never Smoker   Smokeless tobacco  . Never Used    History  Alcohol Use No    Family History  Problem Relation Age of Onset  . Cancer Mother 44    CANCER  . Heart disease Father 32    HEART PROBLEMS    Review of Systems: Constitutional: no fever chills diaphoresis or fatigue or change in weight.  Head and neck: no hearing loss, no epistaxis, no photophobia or visual disturbance. Respiratory: No cough, shortness of breath or wheezing. Cardiovascular: No chest pain peripheral edema, palpitations. Gastrointestinal: No abdominal distention, no abdominal pain, no change in bowel habits hematochezia or melena. Genitourinary: No dysuria, no frequency, no urgency, no nocturia. Musculoskeletal:No arthralgias, no back pain, no gait disturbance or myalgias. Neurological: No dizziness, no headaches, no numbness, no seizures, no syncope, no weakness, no tremors. Hematologic: No lymphadenopathy, no easy bruising. Psychiatric: No confusion, no hallucinations, no sleep disturbance.    Physical Exam: Filed Vitals:   02/26/13 1106  BP: 120/64  Pulse: 127   the general appearance reveals a well-developed elderly gentleman in no distress.The head and neck exam reveals pupils equal and reactive.  Extraocular movements are full.  There is no scleral icterus.  The mouth and pharynx are normal.  The neck is supple.  The carotids reveal no bruits.  The jugular venous pressure is normal.   The  thyroid is not enlarged.  There is no lymphadenopathy.  The chest is clear to percussion and auscultation.  There are no rales or rhonchi.  Expansion of the chest is symmetrical.  The precordium is quiet.  The pulse is rapid and irregular with heart rate 127.  The first heart sound is normal.  The second heart sound is physiologically split.  There is no murmur gallop rub or click.  There is no abnormal lift or heave.  The abdomen is soft and nontender.  The bowel sounds are normal.  The liver and spleen are not enlarged.  There are no abdominal masses.  There are no abdominal bruits.  Extremities reveal  good pedal pulses.  There is no phlebitis or edema.  There is no cyanosis or clubbing.  Strength is normal and symmetrical in all extremities.  There is no lateralizing weakness.  There are no sensory deficits.  The skin is warm and dry.  There is no rash.   EKG confirms recurrent atrial fibrillation with rapid ventricular response, new since successful cardioversion on 02/11/13  Assessment / Plan: We will increase his AV blocking agents at this point in an attempt to slow his ventricular response.  The patient has an appointment to see Dr. [name] next week at which time decision can be made concerning any further attempts at antiarrhythmic therapy versus rate control strategy. Recheck here in 2 months for office visit and EKG

## 2013-03-03 ENCOUNTER — Ambulatory Visit (INDEPENDENT_AMBULATORY_CARE_PROVIDER_SITE_OTHER): Payer: Medicare Other | Admitting: Internal Medicine

## 2013-03-03 ENCOUNTER — Ambulatory Visit (INDEPENDENT_AMBULATORY_CARE_PROVIDER_SITE_OTHER): Payer: Medicare Other | Admitting: Pharmacist

## 2013-03-03 ENCOUNTER — Encounter: Payer: Self-pay | Admitting: Internal Medicine

## 2013-03-03 VITALS — BP 110/70 | HR 76 | Ht 71.0 in | Wt 199.0 lb

## 2013-03-03 DIAGNOSIS — Z7901 Long term (current) use of anticoagulants: Secondary | ICD-10-CM

## 2013-03-03 DIAGNOSIS — I10 Essential (primary) hypertension: Secondary | ICD-10-CM

## 2013-03-03 DIAGNOSIS — I4891 Unspecified atrial fibrillation: Secondary | ICD-10-CM

## 2013-03-03 DIAGNOSIS — I428 Other cardiomyopathies: Secondary | ICD-10-CM

## 2013-03-03 DIAGNOSIS — G4731 Primary central sleep apnea: Secondary | ICD-10-CM

## 2013-03-03 DIAGNOSIS — G4730 Sleep apnea, unspecified: Secondary | ICD-10-CM

## 2013-03-03 LAB — HEPATIC FUNCTION PANEL
Albumin: 4 g/dL (ref 3.5–5.2)
Alkaline Phosphatase: 51 U/L (ref 39–117)
Bilirubin, Direct: 0.2 mg/dL (ref 0.0–0.3)

## 2013-03-03 LAB — POCT INR: INR: 3.5

## 2013-03-03 LAB — TSH: TSH: 3.92 u[IU]/mL (ref 0.35–5.50)

## 2013-03-03 MED ORDER — AMIODARONE HCL 200 MG PO TABS: ORAL_TABLET | ORAL | Status: DC

## 2013-03-03 NOTE — Progress Notes (Signed)
PCP: Charles Capers, MD Primary Cardiologist: [name] is a 72 y.o. male who presents today for routine electrophysiology followup.  Since his afib ablation, the patient continues to have afib.  He has been initiated on CPAP for OSA.  His toprol has been increased by [name].  Today, he denies symptoms of  chest pain, shortness of breath,  lower extremity edema, dizziness, presyncope, or syncope.  The patient is otherwise without complaint today.   Past Medical History  Diagnosis Date  . Persistent atrial fibrillation     s/p PVI ablation 12-03-2012 by [name]  . Peripheral edema   . Mitral regurgitation     MILD  . Atrial dilatation     mild biatrial dilitation  . Nonischemic cardiomyopathy     presumed to be tachycardia mediated  . Hypertension   . Hypercholesterolemia   . Chronic kidney disease     baseline creatinine is 1.8  . GERD (gastroesophageal reflux disease)   . H/O hiatal hernia   . Arthritis    Past Surgical History  Procedure Laterality Date  . Lithotripsy      x 2  . Joint replacement  2005    total right hip replacement  . Hernia repair  03/2005  . Kidney stent  1987, 02/2005  . Quadrec      right leg quad sx  . Cardioversion N/A 09/04/2012    Procedure: CARDIOVERSION;  Surgeon: [name], MD;  [facility] ENDOSCOPY;  Service: Cardiovascular;  Laterality: N/A;  . Tee without cardioversion N/A 12/02/2012    Procedure: TRANSESOPHAGEAL ECHOCARDIOGRAM (TEE);  Surgeon: [name], MD;  Location: [facility] ENDOSCOPY;  Service: Cardiovascular;  Laterality: N/A;  . Atrial fibrillation ablation  12-03-2012    PVI by [name]  . Cardioversion N/A 02/11/2013    Procedure: CARDIOVERSION;  Surgeon: [name], MD;  Location: [facility] ENDOSCOPY;  Service: Cardiovascular;  Laterality: N/A;    Current Outpatient Prescriptions  Medication Sig Dispense Refill  . Ascorbic Acid (VITAMIN C) 500 MG tablet Take 1,500 mg by mouth daily.        .  Cholecalciferol (VITAMIN D) 2000 UNITS CAPS Take 4,000 Units by mouth daily.        . Coenzyme Q10 (COQ10 PO) Take 100 mg by mouth daily.       . enalapril (VASOTEC) 20 MG tablet Take 20 mg by mouth as directed. 1/2 TABLET DAILY      . furosemide (LASIX) 40 MG tablet Take 80 mg by mouth daily.      Marland Kitchen guaiFENesin (MUCINEX) 600 MG 12 hr tablet Take 600 mg by mouth 2 (two) times daily as needed for congestion.      Marland Kitchen levothyroxine (SYNTHROID, LEVOTHROID) 50 MCG tablet Take 50 mcg by mouth daily before breakfast.      . Magnesium 200 MG TABS Take 1 tablet by mouth 2 (two) times daily. Magnesium Citrate      . metolazone (ZAROXOLYN) 5 MG tablet Take 5 mg by mouth as needed (for swelling).       . metoprolol succinate (TOPROL-XL) 100 MG 24 hr tablet 1 TABLET IN THE MORNING AND 1/2 IN THE EVENING AFTER SUPPER  135 tablet  3  . Misc Natural Products (OSTEO BI-FLEX JOINT SHIELD PO) Take 2 tablets by mouth daily.       . Multiple Vitamin (MULTIVITAMIN) tablet Take 1 tablet by mouth daily.        . Omega-3 Fatty Acids (FISH OIL)  1000 MG CAPS Take 1 capsule by mouth 2 (two) times daily.       Marland Kitchen omeprazole (PRILOSEC) 20 MG capsule Take 1 capsule (20 mg total) by mouth daily as needed (for acid reflux).  90 capsule  3  . warfarin (COUMADIN) 5 MG tablet Take 5-7.5 mg by mouth daily. Takes 5mg  every day, except takes 7.5mg  on mondays      . zinc gluconate 50 MG tablet Take 50 mg by mouth daily.         No current facility-administered medications for this visit.    Physical Exam: Filed Vitals:   03/03/13 0934  BP: 110/70  Pulse: 76  Height: 5\' 11"  (1.803 m)  Weight: 199 lb (90.266 kg)    GEN- The patient is well appearing, alert and oriented x 3 today.   Head- normocephalic, atraumatic Eyes-  Sclera clear, conjunctiva pink Ears- hearing intact Oropharynx- clear Lungs- Clear to ausculation bilaterally, normal work of breathing Heart- tachy irregular rhythm, no murmurs, rubs or gallops, PMI not  laterally displaced GI- soft, NT, ND, + BS Extremities- no clubbing, cyanosis, or edema  ekg 9/3/14today reveals afib with RVR  Assessment and Plan:  1. Persistent afib Continues to have afib post ablation.  He remains within the early months/ healing post ablation.  I think that at this point we need to reinitiate an antiarrhythmic drug to try to achieve and maintain sinus to allow the heart to remodel.  I will therefore restart amiodarone at this time.  Will start 400mg  BID x 7 days then 200mg  BID thereafter.  He will need close [name] follow ups.  Risks amiodarone were discussed with the patient and his spouse today who wish to proceed.   He will return for an ekg in 3-4 weeks.  If he remains in afib, he will need repeat cardioversion.  I will see him 4 weeks after that. He will follow with [name] in the interim.  2. CRI Limits our antiarrhythmic options  3. HTN Stable No change required today  4. Nonischemic CM euvolemic today Continue to pursue sinus rhythm  Return in 6-8 weeks to see me

## 2013-03-03 NOTE — Patient Instructions (Addendum)
Your physician recommends that you schedule a follow-up appointment in: 3-4 weeks for an EKG and will need to arrange DCCV if still in afib  Your physician recommends that you schedule a follow-up appointment in: 2 months with [name]  Your physician has recommended you make the following change in your medication:  1) Start Amiodarone 200mg   Take 400mg  twice daily for 7 days, then decrease to 200mg  tiwce daily for 3 weeks, then decrease to 200mg  daily  Will follow [name]'s closely

## 2013-03-12 ENCOUNTER — Other Ambulatory Visit: Payer: Self-pay | Admitting: Cardiology

## 2013-03-21 ENCOUNTER — Ambulatory Visit (INDEPENDENT_AMBULATORY_CARE_PROVIDER_SITE_OTHER): Payer: Medicare Other | Admitting: *Deleted

## 2013-03-21 DIAGNOSIS — Z7901 Long term (current) use of anticoagulants: Secondary | ICD-10-CM

## 2013-03-21 DIAGNOSIS — I4891 Unspecified atrial fibrillation: Secondary | ICD-10-CM

## 2013-03-31 ENCOUNTER — Ambulatory Visit (INDEPENDENT_AMBULATORY_CARE_PROVIDER_SITE_OTHER): Payer: Medicare Other | Admitting: *Deleted

## 2013-03-31 ENCOUNTER — Encounter: Payer: Self-pay | Admitting: *Deleted

## 2013-03-31 VITALS — HR 57

## 2013-03-31 DIAGNOSIS — I4891 Unspecified atrial fibrillation: Secondary | ICD-10-CM

## 2013-03-31 DIAGNOSIS — Z7901 Long term (current) use of anticoagulants: Secondary | ICD-10-CM

## 2013-03-31 LAB — POCT INR: INR: 3.6

## 2013-03-31 NOTE — Progress Notes (Signed)
Patient returns today for EKG 3 weeks after reinitiating Amiodarone s/p ablation.  He is sinus bradycardia with a HR of 57.  He states he feels much better than before and will call if has any sustained arrhthymias

## 2013-03-31 NOTE — Addendum Note (Signed)
Addended by: Dennis Bast F on: 03/31/2013 10:53 AM   Modules accepted: Orders

## 2013-04-11 ENCOUNTER — Ambulatory Visit (INDEPENDENT_AMBULATORY_CARE_PROVIDER_SITE_OTHER): Payer: Medicare Other | Admitting: *Deleted

## 2013-04-11 DIAGNOSIS — I4891 Unspecified atrial fibrillation: Secondary | ICD-10-CM

## 2013-04-11 DIAGNOSIS — Z7901 Long term (current) use of anticoagulants: Secondary | ICD-10-CM

## 2013-04-11 LAB — POCT INR: INR: 4.6

## 2013-04-23 ENCOUNTER — Ambulatory Visit (INDEPENDENT_AMBULATORY_CARE_PROVIDER_SITE_OTHER): Payer: Medicare Other | Admitting: Pharmacist

## 2013-04-23 DIAGNOSIS — Z7901 Long term (current) use of anticoagulants: Secondary | ICD-10-CM

## 2013-04-23 DIAGNOSIS — I4891 Unspecified atrial fibrillation: Secondary | ICD-10-CM

## 2013-04-23 LAB — POCT INR: INR: 1.9

## 2013-04-29 ENCOUNTER — Telehealth: Payer: Self-pay | Admitting: *Deleted

## 2013-04-29 ENCOUNTER — Ambulatory Visit (INDEPENDENT_AMBULATORY_CARE_PROVIDER_SITE_OTHER): Payer: Medicare Other | Admitting: Cardiology

## 2013-04-29 ENCOUNTER — Encounter: Payer: Self-pay | Admitting: Cardiology

## 2013-04-29 VITALS — BP 133/57 | HR 51 | Ht 71.0 in | Wt 186.0 lb

## 2013-04-29 DIAGNOSIS — I4891 Unspecified atrial fibrillation: Secondary | ICD-10-CM

## 2013-04-29 DIAGNOSIS — E039 Hypothyroidism, unspecified: Secondary | ICD-10-CM

## 2013-04-29 DIAGNOSIS — I480 Paroxysmal atrial fibrillation: Secondary | ICD-10-CM

## 2013-04-29 DIAGNOSIS — I509 Heart failure, unspecified: Secondary | ICD-10-CM

## 2013-04-29 LAB — BASIC METABOLIC PANEL
BUN: 34 mg/dL — ABNORMAL HIGH (ref 6–23)
Chloride: 95 mEq/L — ABNORMAL LOW (ref 96–112)
GFR: 39.61 mL/min — ABNORMAL LOW (ref >60.00)
Potassium: 4.2 mEq/L (ref 3.5–5.1)
Sodium: 137 mEq/L (ref 135–145)

## 2013-04-29 LAB — T4, FREE: Free T4: 1.39 ng/dL (ref 0.60–1.60)

## 2013-04-29 LAB — HEPATIC FUNCTION PANEL
ALT: 39 U/L (ref 0–53)
Bilirubin, Direct: 0.3 mg/dL (ref 0.0–0.3)
Total Protein: 7.2 g/dL (ref 6.0–8.3)

## 2013-04-29 NOTE — Assessment & Plan Note (Signed)
The patient is clinically euthyroid.  We are checking TSH and free T4 today.  Continue same dose of Synthroid for now

## 2013-04-29 NOTE — Patient Instructions (Signed)
Will obtain labs today and call you with the results (bmet/tsh/ft4/hfp)  Your physician recommends that you continue on your current medications as directed. Please refer to the Current Medication list given to you today.  Your physician recommends that you schedule a follow-up appointment in: mid to late December

## 2013-04-29 NOTE — Telephone Encounter (Signed)
Advised patient of lab results and medication change  

## 2013-04-29 NOTE — Assessment & Plan Note (Signed)
He is maintaining normal sinus rhythm since mid-September on amiodarone.  We will continue same dose of amiodarone and he has an appointment to see Dr. [name] next week.

## 2013-04-29 NOTE — Progress Notes (Signed)
Charles [name] Date of Birth:  09-20-1940 [address] [place], [place]  [address] [phone]         Fax   [phone]  History of Present Illness: This pleasant 72 year old is seen for a scheduled followup office visit. He has a history of recurrent atrial fibrillation. 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 After cardioversion he showed significant clinical improvement in his ejection fraction on 10/24/11 while in normal sinus rhythm was 55-60%. Approximately 8 months ago the patient went back into atrial fibrillation with a rapid rate. An echocardiogram on 08/13/12 showed that his ejection fraction had dropped to 40%. He underwent a second cardioversion on 09/04/12 and converted to normal sinus rhythm. However by 09/13/12 he was back in atrial fibrillation. He was referred to Dr. [name] and on 12/03/2012 underwent a radiofrequency ablation by Dr. [name]. At the conclusion of the test he was cardioverted into normal sinus rhythm but his cardioversion lasted only 3 days before he went back into atrial fibrillation on about 12/06/2012.  The patient underwent elective direct current cardioversion on 02/11/13.  This was his fourth cardioversion.  Unfortunately he stayed in sinus rhythm only about 10 days before he recognized that he was back in atrial fibrillation with rapid ventricular response.. The patient has a history of sleep apnea and has been using a CPAP machine for the past 2-3 weeks. His wife states that since he started using a CPAP machine his sleep was much more restful, and the patient agrees. He is tolerating the CPAP well.   The patient saw Dr. [name] on 03/03/13.  He was still in atrial fibrillation at that time.  The patient was placed on amiodarone in an initial dose of 800 mg a day for a week then 400 mg a day.  He states that about 2 days after starting amiodarone he  went back into normal sinus rhythm.  EKG today confirms sinus bradycardia.  He has felt better with improved exercise capacity is since sinus rhythm  has been reestablished.   Current Outpatient Prescriptions  Medication Sig Dispense Refill  . amiodarone (PACERONE) 200 MG tablet Take 200mg  (2 tablets) twice daily for 1 week,Then decrease to 200mg (1 tablet) twice daily for 3 weeks,Then decrease to 200mg  daily.  90 tablet  3  . Ascorbic Acid (VITAMIN C) 500 MG tablet Take 1,500 mg by mouth daily.        . Cholecalciferol (VITAMIN D) 2000 UNITS CAPS Take 4,000 Units by mouth daily.        . Coenzyme Q10 (COQ10 PO) Take 100 mg by mouth daily.       . enalapril (VASOTEC) 20 MG tablet Take 20 mg by mouth as directed. 1/2 TABLET DAILY      . furosemide (LASIX) 40 MG tablet Take 80 mg by mouth daily.      Marland Kitchen guaiFENesin (MUCINEX) 600 MG 12 hr tablet Take 600 mg by mouth 2 (two) times daily as needed for congestion.      Marland Kitchen levothyroxine (SYNTHROID, LEVOTHROID) 50 MCG tablet Take 50 mcg by mouth daily before breakfast.      . Magnesium 200 MG TABS Take 1 tablet by mouth 2 (two) times daily. Magnesium Citrate      . metolazone (ZAROXOLYN) 5 MG tablet Take 1 tablet (5 mg total) by mouth as needed.  30 tablet  1  . metoprolol succinate (TOPROL-XL) 100  MG 24 hr tablet 1 TABLET IN THE MORNING AND 1/2 IN THE EVENING AFTER SUPPER  135 tablet  3  . Misc Natural Products (OSTEO BI-FLEX JOINT SHIELD PO) Take 2 tablets by mouth daily.       . Multiple Vitamin (MULTIVITAMIN) tablet Take 1 tablet by mouth daily.        . Omega-3 Fatty Acids (FISH OIL) 1000 MG CAPS Take 1 capsule by mouth 2 (two) times daily.       Marland Kitchen omeprazole (PRILOSEC) 20 MG capsule Take 1 capsule (20 mg total) by mouth daily as needed (for acid reflux).  90 capsule  3  . warfarin (COUMADIN) 5 MG tablet Take 5-7.5 mg by mouth daily. Takes 5mg  every day, except takes 7.5mg  on mondays      . zinc gluconate 50 MG tablet Take 50 mg by mouth daily.           No current facility-administered medications for this visit.    Allergies  Allergen Reactions  . Ceftriaxone Sodium Rash    Rocephin  . Diltiazem Hcl Rash    Cardizem  . Penicillins Rash    Patient Active Problem List   Diagnosis Date Noted  . Atrial fibrillation     Priority: High  . CHF (congestive heart failure)     Priority: High  . Hypertension     Priority: High  . Complex sleep apnea syndrome 11/21/2012  . Nonischemic cardiomyopathy 11/09/2012  . Snoring 11/09/2012  . Hypothyroidism 08/26/2012  . Long term (current) use of anticoagulants 08/01/2012  . Tremor 10/24/2011  . Peripheral edema   . Swelling   . Mitral regurgitation   . Orthopnea   . Dyspnea   . Hypercholesterolemia   . Chronic renal insufficiency   . HYPERTENSION 11/07/2006  . GERD 11/07/2006  . ABDOMINAL WALL HERNIA 11/07/2006  . OSTEOARTHROSIS NOS, UNSPECIFIED SITE 11/07/2006  . ABRASION, LOWER EXTREMITY W/INFECTION 11/07/2006  . NEPHROLITHIASIS, HX OF 11/07/2006  . HIP REPLACEMENT, TOTAL, HX OF 11/07/2006    History  Smoking status  . Never Smoker   Smokeless tobacco  . Never Used    History  Alcohol Use No    Family History  Problem Relation Age of Onset  . Cancer Mother 61    CANCER  . Heart disease Father 4    HEART PROBLEMS    Review of Systems: Constitutional: no fever chills diaphoresis or fatigue or change in weight.  Head and neck: no hearing loss, no epistaxis, no photophobia or visual disturbance. Respiratory: No cough, shortness of breath or wheezing. Cardiovascular: No chest pain peripheral edema, palpitations. Gastrointestinal: No abdominal distention, no abdominal pain, no change in bowel habits hematochezia or melena. Genitourinary: No dysuria, no frequency, no urgency, no nocturia. Musculoskeletal:No arthralgias, no back pain, no gait disturbance or myalgias. Neurological: No dizziness, no headaches, no numbness, no seizures, no syncope, no weakness, no  tremors. Hematologic: No lymphadenopathy, no easy bruising. Psychiatric: No confusion, no hallucinations, no sleep disturbance.    Physical Exam: Filed Vitals:   04/29/13 1044  BP: 133/57  Pulse: 51   the general appearance reveals a well-developed elderly gentleman in no distress.The head and neck exam reveals pupils equal and reactive.  Extraocular movements are full.  There is no scleral icterus.  The mouth and pharynx are normal.  The neck is supple.  The carotids reveal no bruits.  The jugular venous pressure is normal.  The  thyroid is not enlarged.  There is no lymphadenopathy.  The chest is clear to percussion and auscultation.  There are no rales or rhonchi.  Expansion of the chest is symmetrical.  The precordium is quiet.  The pulse is slow and regular at 51.  The first heart sound is normal.  The second heart sound is physiologically split.  There is no murmur gallop rub or click.  There is no abnormal lift or heave.  The abdomen is soft and nontender.  The bowel sounds are normal.  The liver and spleen are not enlarged.  There are no abdominal masses.  There are no abdominal bruits.  Extremities reveal good pedal pulses.  There is no phlebitis or edema.  There is no cyanosis or clubbing.  Strength is normal and symmetrical in all extremities.  There is no lateralizing weakness.  There are no sensory deficits.  The skin is warm and dry.  There is no rash.   EKG confirms sinus bradycardia at a rate of 51 beats per minute  Assessment / Plan: Continue same medication.  We are checking basal metabolic panel hepatic function panel TSH and free T4 today. Keep appointment with Dr. [name] next week to decide about ongoing dose of amiodarone. Recheck here in late December before he heads to [place] for office visit EKG TSH nasal metabolic panel and hepatic function panel

## 2013-04-29 NOTE — Progress Notes (Signed)
Quick Note:  Please report to patient. The recent labs are stable. Thyroid is normal on current synthroid. Kidneys are better but still not where we want them. Try cutting back on lasix to just 60 mg daily. LFTs are normal. ______

## 2013-04-29 NOTE — Telephone Encounter (Signed)
Message copied by Burnell Blanks on Tue Apr 29, 2013  5:48 PM ------      Message from: [name]      Created: Tue Apr 29, 2013  4:57 PM       Please report to patient.  The recent labs are stable. Thyroid is normal on current synthroid. Kidneys are better but still not where we want them. Try cutting back on lasix to just 60 mg daily. LFTs are normal. ------

## 2013-04-29 NOTE — Assessment & Plan Note (Signed)
The patient has had improved hemodynamics since normal sinus rhythm is reestablished.  His weight is down 9 pounds and his peripheral edema has resolved.  His exercise tolerance has improved.

## 2013-05-01 ENCOUNTER — Other Ambulatory Visit: Payer: Self-pay

## 2013-05-05 ENCOUNTER — Ambulatory Visit (INDEPENDENT_AMBULATORY_CARE_PROVIDER_SITE_OTHER): Payer: Medicare Other | Admitting: Internal Medicine

## 2013-05-05 ENCOUNTER — Ambulatory Visit (INDEPENDENT_AMBULATORY_CARE_PROVIDER_SITE_OTHER): Payer: Medicare Other | Admitting: General Practice

## 2013-05-05 ENCOUNTER — Encounter: Payer: Self-pay | Admitting: Internal Medicine

## 2013-05-05 VITALS — BP 130/70 | HR 55 | Ht 71.0 in | Wt 191.8 lb

## 2013-05-05 DIAGNOSIS — I4891 Unspecified atrial fibrillation: Secondary | ICD-10-CM

## 2013-05-05 DIAGNOSIS — Z7901 Long term (current) use of anticoagulants: Secondary | ICD-10-CM

## 2013-05-05 DIAGNOSIS — I10 Essential (primary) hypertension: Secondary | ICD-10-CM

## 2013-05-05 DIAGNOSIS — N189 Chronic kidney disease, unspecified: Secondary | ICD-10-CM

## 2013-05-05 DIAGNOSIS — I428 Other cardiomyopathies: Secondary | ICD-10-CM

## 2013-05-05 LAB — POCT INR: INR: 2.6

## 2013-05-05 MED ORDER — AMIODARONE HCL 200 MG PO TABS: ORAL_TABLET | ORAL | Status: DC

## 2013-05-05 NOTE — Patient Instructions (Signed)
Your physician recommends that you schedule a follow-up appointment in: 4 months with [name]  Your physician has recommended you make the following change in your medication:  1) Decrease Amiodarone to 200mg  daily

## 2013-05-05 NOTE — Progress Notes (Signed)
PCP: [name], MD Primary Cardiologist: [name] is a 72 y.o. male who presents today for routine electrophysiology followup.  He is maintaining sinus rhythm with amiodarone.  He feels "much better".  He is tolerating this medicine without difficulty.  Today, he denies symptoms of  chest pain, shortness of breath,  lower extremity edema, dizziness, presyncope, or syncope.  The patient is otherwise without complaint today.   Past Medical History  Diagnosis Date  . Persistent atrial fibrillation     s/p PVI ablation 12-03-2012 by [name]  . Peripheral edema   . Mitral regurgitation     MILD  . Atrial dilatation     mild biatrial dilitation  . Nonischemic cardiomyopathy     presumed to be tachycardia mediated  . Hypertension   . Hypercholesterolemia   . Chronic kidney disease     baseline creatinine is 1.8  . GERD (gastroesophageal reflux disease)   . H/O hiatal hernia   . Arthritis    Past Surgical History  Procedure Laterality Date  . Lithotripsy      x 2  . Joint replacement  2005    total right hip replacement  . Hernia repair  03/2005  . Kidney stent  1987, 02/2005  . Quadrec      right leg quad sx  . Cardioversion N/A 09/04/2012    Procedure: CARDIOVERSION;  Surgeon: [name], MD;  Location: [facility] ENDOSCOPY;  Service: Cardiovascular;  Laterality: N/A;  . Tee without cardioversion N/A 12/02/2012    Procedure: TRANSESOPHAGEAL ECHOCARDIOGRAM (TEE);  Surgeon: [name], MD;  Location: [facility] ENDOSCOPY;  Service: Cardiovascular;  Laterality: N/A;  . Atrial fibrillation ablation  12-03-2012    PVI by [name]  . Cardioversion N/A 02/11/2013    Procedure: CARDIOVERSION;  Surgeon: [name], MD;  Location: [facility] ENDOSCOPY;  Service: Cardiovascular;  Laterality: N/A;    Current Outpatient Prescriptions  Medication Sig Dispense Refill  . amiodarone (PACERONE) 200 MG tablet Take 200 mg by mouth 2 (two) times daily. Take 200mg  (2 tablets) twice  daily for 1 week,Then decrease to 200mg (1 tablet) twice daily for 3 weeks,Then decrease to 200mg  daily.      . Ascorbic Acid (VITAMIN C) 500 MG tablet Take 1,500 mg by mouth daily.        . Cholecalciferol (VITAMIN D) 2000 UNITS CAPS Take 4,000 Units by mouth daily.        . Coenzyme Q10 (COQ10 PO) Take 100 mg by mouth daily.       . enalapril (VASOTEC) 20 MG tablet Take 20 mg by mouth as directed. 1/2 TABLET DAILY      . furosemide (LASIX) 40 MG tablet 1 and 1/2 tablets daily      . guaiFENesin (MUCINEX) 600 MG 12 hr tablet Take 600 mg by mouth 2 (two) times daily as needed for congestion.      Marland Kitchen levothyroxine (SYNTHROID, LEVOTHROID) 50 MCG tablet Take 50 mcg by mouth daily before breakfast.      . Magnesium 200 MG TABS Take 1 tablet by mouth 2 (two) times daily. Magnesium Citrate      . metolazone (ZAROXOLYN) 5 MG tablet Take 1 tablet (5 mg total) by mouth as needed.  30 tablet  1  . metoprolol succinate (TOPROL-XL) 100 MG 24 hr tablet 1 TABLET IN THE MORNING AND 1/2 IN THE EVENING AFTER SUPPER  135 tablet  3  . Misc Natural Products (OSTEO BI-FLEX JOINT SHIELD PO) Take 2  tablets by mouth daily.       . Multiple Vitamin (MULTIVITAMIN) tablet Take 1 tablet by mouth daily.        . Omega-3 Fatty Acids (FISH OIL) 1000 MG CAPS Take 1 capsule by mouth 2 (two) times daily.       Marland Kitchen omeprazole (PRILOSEC) 20 MG capsule Take 1 capsule (20 mg total) by mouth daily as needed (for acid reflux).  90 capsule  3  . warfarin (COUMADIN) 5 MG tablet Take 5-7.5 mg by mouth daily. Takes 5mg  every day, except takes 7.5mg  on mondays      . zinc gluconate 50 MG tablet Take 50 mg by mouth daily.         No current facility-administered medications for this visit.    Physical Exam: Filed Vitals:   05/05/13 1053  BP: 130/70  Pulse: 55  Height: 5\' 11"  (1.803 m)  Weight: 191 lb 12.8 oz (87 kg)    GEN- The patient is well appearing, alert and oriented x 3 today.   Head- normocephalic, atraumatic Eyes-  R eye  with conjunctival hemorrhage Ears- hearing intact Oropharynx- clear Lungs- Clear to ausculation bilaterally, normal work of breathing Heart- sinus rhythm, no murmurs, rubs or gallops, PMI not laterally displaced GI- soft, NT, ND, + BS Extremities- no clubbing, cyanosis, or edema  ekg 04/29/13 reveals sinus bradycardia  Assessment and Plan:  1. Persistent afib Now back in sinus Recent labs reviewed Decrease amiodarone to 200mg  daily Consider decreasing amiodarone to 100mg  daily in 4 months  2. CRI Limits our antiarrhythmic options  3. HTN Stable No change required today  4. Nonischemic CM euvolemic today Continue to pursue sinus rhythm  Return in 4 months Follow-up with [name] as scheduled

## 2013-05-13 ENCOUNTER — Ambulatory Visit (INDEPENDENT_AMBULATORY_CARE_PROVIDER_SITE_OTHER): Payer: Medicare Other | Admitting: *Deleted

## 2013-05-13 DIAGNOSIS — Z7901 Long term (current) use of anticoagulants: Secondary | ICD-10-CM

## 2013-05-13 DIAGNOSIS — I4891 Unspecified atrial fibrillation: Secondary | ICD-10-CM

## 2013-05-13 LAB — POCT INR: INR: 2.2

## 2013-05-30 ENCOUNTER — Telehealth: Payer: Self-pay | Admitting: Pulmonary Disease

## 2013-05-30 DIAGNOSIS — G4733 Obstructive sleep apnea (adult) (pediatric): Secondary | ICD-10-CM

## 2013-05-30 NOTE — Telephone Encounter (Signed)
I called and spoke with pt. He reports he uses [facility] for DME. He has had a bad experience with them from the beginning and would like to change DME's to someone else. Please advise [facility]'s who would be in network with pt? thanks

## 2013-05-30 NOTE — Telephone Encounter (Signed)
Need an order to change homecare from [facility] to whoever thanks [facility]

## 2013-05-30 NOTE — Telephone Encounter (Signed)
Order placed. thanks

## 2013-06-03 ENCOUNTER — Ambulatory Visit (INDEPENDENT_AMBULATORY_CARE_PROVIDER_SITE_OTHER): Payer: Medicare Other | Admitting: *Deleted

## 2013-06-03 DIAGNOSIS — I4891 Unspecified atrial fibrillation: Secondary | ICD-10-CM

## 2013-06-03 DIAGNOSIS — Z7901 Long term (current) use of anticoagulants: Secondary | ICD-10-CM

## 2013-06-03 LAB — POCT INR: INR: 2.5

## 2013-06-05 ENCOUNTER — Telehealth: Payer: Self-pay | Admitting: Pulmonary Disease

## 2013-06-05 NOTE — Telephone Encounter (Signed)
Message sent to [email or web address] Sally E Ottinger ° °

## 2013-06-05 NOTE — Telephone Encounter (Signed)
Spoke to pt. According to Libby's documentation on the referral, the order was faxed to [facility] at [facility]. The pt is not really happy that it's 6 days later and he still hasn't heard from [facility] about his CPAP.  Almyra Free would you mind looking into this? Thanks!

## 2013-06-09 ENCOUNTER — Telehealth: Payer: Self-pay | Admitting: Pulmonary Disease

## 2013-06-09 DIAGNOSIS — G4733 Obstructive sleep apnea (adult) (pediatric): Secondary | ICD-10-CM

## 2013-06-09 NOTE — Telephone Encounter (Signed)
Order will be placed. Will send staff message to [facility] to make her aware.

## 2013-06-09 NOTE — Telephone Encounter (Signed)
Clarified with [email or web address]  pt needs cpap equiptment [facility]

## 2013-06-12 ENCOUNTER — Ambulatory Visit (INDEPENDENT_AMBULATORY_CARE_PROVIDER_SITE_OTHER): Payer: Medicare Other | Admitting: Cardiology

## 2013-06-12 ENCOUNTER — Encounter: Payer: Self-pay | Admitting: Cardiology

## 2013-06-12 VITALS — BP 147/61 | HR 50 | Ht 71.0 in | Wt 190.0 lb

## 2013-06-12 DIAGNOSIS — I10 Essential (primary) hypertension: Secondary | ICD-10-CM

## 2013-06-12 DIAGNOSIS — I340 Nonrheumatic mitral (valve) insufficiency: Secondary | ICD-10-CM

## 2013-06-12 DIAGNOSIS — Z79899 Other long term (current) drug therapy: Secondary | ICD-10-CM

## 2013-06-12 DIAGNOSIS — I4891 Unspecified atrial fibrillation: Secondary | ICD-10-CM

## 2013-06-12 DIAGNOSIS — I059 Rheumatic mitral valve disease, unspecified: Secondary | ICD-10-CM

## 2013-06-12 DIAGNOSIS — E039 Hypothyroidism, unspecified: Secondary | ICD-10-CM

## 2013-06-12 DIAGNOSIS — I480 Paroxysmal atrial fibrillation: Secondary | ICD-10-CM

## 2013-06-12 DIAGNOSIS — Z7901 Long term (current) use of anticoagulants: Secondary | ICD-10-CM

## 2013-06-12 NOTE — Assessment & Plan Note (Signed)
The patient has a history of mitral regurgitation.  He has not been experiencing any symptoms of recent CHF.  His weight is up 4 pounds since we last saw him.  He does not have to take a Zaroxolyn except every several weeks.

## 2013-06-12 NOTE — Patient Instructions (Addendum)
Your physician recommends that you continue on your current medications as directed. Please refer to the Current Medication list given to you today.  Your physician wants you to follow-up in: IN April FOR OV/CBC/HFP/TSH/FT4/LP/BMET  FASTING LABS You will receive a reminder letter in the mail two months in advance. If you don't receive a letter, please call our office to schedule the follow-up appointment.

## 2013-06-12 NOTE — Assessment & Plan Note (Addendum)
The patient is clinically euthyroid.  We will plan to recheck his thyroid function will see him in April.  The patient remains on Synthroid 50 mcg daily

## 2013-06-12 NOTE — Assessment & Plan Note (Signed)
The patient is maintaining normal sinus rhythm clinically on current amiodarone 200 mg daily.  We did not do an EKG today.  He is having some increase in his intention tremor which may be from his amiodarone.  We will continue current low dose amiodarone until he sees Dr. [name] in March

## 2013-06-12 NOTE — Progress Notes (Signed)
Charles [name] Date of Birth:  20-Apr-1941 [address] [place], [place]  [address] [phone]         Fax   [phone]  History of Present Illness: This pleasant 72 year old is seen for a scheduled followup office visit. He has a history of recurrent atrial fibrillation. 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 After cardioversion he showed significant clinical improvement in his ejection fraction on 10/24/11 while in normal sinus rhythm was 55-60%. Approximately 12 months ago the patient went back into atrial fibrillation with a rapid rate. An echocardiogram on 08/13/12 showed that his ejection fraction had dropped to 40%. He underwent a second cardioversion on 09/04/12 and converted to normal sinus rhythm. However by 09/13/12 he was back in atrial fibrillation. He was referred to Dr. [name] and on 12/03/2012 underwent a radiofrequency ablation by Dr. [name]. At the conclusion of the test he was cardioverted into normal sinus rhythm but his cardioversion lasted only 3 days before he went back into atrial fibrillation on about 12/06/2012.  The patient underwent elective direct current cardioversion on 02/11/13.  This was his fourth cardioversion.  Unfortunately he stayed in sinus rhythm only about 10 days before he recognized that he was back in atrial fibrillation with rapid ventricular response.. The patient has a history of sleep apnea and has been using a CPAP machine for the past 2-3 weeks. His wife states that since he started using a CPAP machine his sleep was much more restful, and the patient agrees. He is tolerating the CPAP well.   The patient saw Dr. [name] on 03/03/13.  He was still in atrial fibrillation at that time.  The patient was placed on amiodarone in an initial dose of 800 mg a day for a week then 400 mg a day.  He states that about 2 days after starting amiodarone  he went back into normal sinus rhythm.  He has remained in normal sinus rhythm.  His tremor has increased somewhat since restarting amiodarone.   Current Outpatient Prescriptions  Medication Sig Dispense Refill  . amiodarone (PACERONE) 200 MG tablet 200mg  daily      . Ascorbic Acid (VITAMIN C) 500 MG tablet Take 1,500 mg by mouth daily.        . Cholecalciferol (VITAMIN D) 2000 UNITS CAPS Take 4,000 Units by mouth daily.        . Coenzyme Q10 (COQ10 PO) Take 100 mg by mouth daily.       . enalapril (VASOTEC) 20 MG tablet Take 20 mg by mouth as directed. 1/2 TABLET DAILY      . furosemide (LASIX) 40 MG tablet 1 and 1/2 tablets daily      . guaiFENesin (MUCINEX) 600 MG 12 hr tablet Take 600 mg by mouth 2 (two) times daily as needed for congestion.      Marland Kitchen levothyroxine (SYNTHROID, LEVOTHROID) 50 MCG tablet Take 50 mcg by mouth daily before breakfast.      . Magnesium 200 MG TABS Take 1 tablet by mouth 2 (two) times daily. Magnesium Citrate      . metolazone (ZAROXOLYN) 5 MG tablet Take 1 tablet (5 mg total) by mouth as needed.  30 tablet  1  . metoprolol succinate (TOPROL-XL) 100 MG 24 hr tablet 1 TABLET IN THE MORNING AND 1/2 IN THE EVENING AFTER SUPPER  135 tablet  3  . Misc Natural Products (OSTEO  BI-FLEX JOINT SHIELD PO) Take 2 tablets by mouth daily.       . Multiple Vitamin (MULTIVITAMIN) tablet Take 1 tablet by mouth daily.        . Omega-3 Fatty Acids (FISH OIL) 1000 MG CAPS Take 1 capsule by mouth 2 (two) times daily.       Marland Kitchen omeprazole (PRILOSEC) 20 MG capsule Take 1 capsule (20 mg total) by mouth daily as needed (for acid reflux).  90 capsule  3  . warfarin (COUMADIN) 5 MG tablet Take 5-7.5 mg by mouth daily. Takes 5mg  every day, except takes 7.5mg  on mondays      . zinc gluconate 50 MG tablet Take 50 mg by mouth daily.         No current facility-administered medications for this visit.    Allergies  Allergen Reactions  . Ceftriaxone Sodium Rash    Rocephin  . Diltiazem Hcl  Rash    Cardizem  . Penicillins Rash    Patient Active Problem List   Diagnosis Date Noted  . Atrial fibrillation     Priority: High  . CHF (congestive heart failure)     Priority: High  . Hypertension     Priority: High  . Complex sleep apnea syndrome 11/21/2012  . Nonischemic cardiomyopathy 11/09/2012  . Snoring 11/09/2012  . Hypothyroidism 08/26/2012  . Long term (current) use of anticoagulants 08/01/2012  . Tremor 10/24/2011  . Peripheral edema   . Swelling   . Mitral regurgitation   . Orthopnea   . Dyspnea   . Hypercholesterolemia   . Chronic renal insufficiency   . HYPERTENSION 11/07/2006  . GERD 11/07/2006  . ABDOMINAL WALL HERNIA 11/07/2006  . OSTEOARTHROSIS NOS, UNSPECIFIED SITE 11/07/2006  . ABRASION, LOWER EXTREMITY W/INFECTION 11/07/2006  . NEPHROLITHIASIS, HX OF 11/07/2006  . HIP REPLACEMENT, TOTAL, HX OF 11/07/2006    History  Smoking status  . Never Smoker   Smokeless tobacco  . Never Used    History  Alcohol Use No    Family History  Problem Relation Age of Onset  . Cancer Mother 55    CANCER  . Heart disease Father 52    HEART PROBLEMS    Review of Systems: Constitutional: no fever chills diaphoresis or fatigue or change in weight.  Head and neck: no hearing loss, no epistaxis, no photophobia or visual disturbance. Respiratory: No cough, shortness of breath or wheezing. Cardiovascular: No chest pain peripheral edema, palpitations. Gastrointestinal: No abdominal distention, no abdominal pain, no change in bowel habits hematochezia or melena. Genitourinary: No dysuria, no frequency, no urgency, no nocturia. Musculoskeletal:No arthralgias, no back pain, no gait disturbance or myalgias. Neurological: No dizziness, no headaches, no numbness, no seizures, no syncope, no weakness, no tremors. Hematologic: No lymphadenopathy, no easy bruising. Psychiatric: No confusion, no hallucinations, no sleep disturbance.    Physical Exam: Filed  Vitals:   06/12/13 1145  BP: 147/61  Pulse: 50   the general appearance reveals a well-developed elderly gentleman in no distress.The head and neck exam reveals pupils equal and reactive.  Extraocular movements are full.  There is no scleral icterus.  The mouth and pharynx are normal.  The neck is supple.  The carotids reveal no bruits.  The jugular venous pressure is normal.  The  thyroid is not enlarged.  There is no lymphadenopathy.  The chest is clear to percussion and auscultation.  There are no rales or rhonchi.  Expansion of the chest is symmetrical.  The precordium is quiet.  The pulse is slow and regular at 51.  The first heart sound is normal.  The second heart sound is physiologically split.  There is no murmur gallop rub or click.  There is no abnormal lift or heave.  The abdomen is soft and nontender.  The bowel sounds are normal.  The liver and spleen are not enlarged.  There are no abdominal masses.  There are no abdominal bruits.  Extremities reveal good pedal pulses.  There is no phlebitis or edema.  There is no cyanosis or clubbing.  Strength is normal and symmetrical in all extremities.  There is no lateralizing weakness.  There are no sensory deficits.  The skin is warm and dry.  There is no rash.     Assessment / Plan: Continue same medication.  Recheck in April for office visit EKG lipid panel basal metabolic panel hepatic function panel CBC free T4 and TSH

## 2013-06-23 ENCOUNTER — Ambulatory Visit (INDEPENDENT_AMBULATORY_CARE_PROVIDER_SITE_OTHER): Payer: Medicare Other

## 2013-06-23 DIAGNOSIS — I4891 Unspecified atrial fibrillation: Secondary | ICD-10-CM

## 2013-06-23 DIAGNOSIS — Z7901 Long term (current) use of anticoagulants: Secondary | ICD-10-CM

## 2013-06-23 LAB — POCT INR: INR: 2.2

## 2013-07-17 ENCOUNTER — Other Ambulatory Visit: Payer: Self-pay | Admitting: Cardiology

## 2013-08-11 ENCOUNTER — Ambulatory Visit (INDEPENDENT_AMBULATORY_CARE_PROVIDER_SITE_OTHER): Payer: Medicare Other | Admitting: Pharmacist

## 2013-08-11 ENCOUNTER — Other Ambulatory Visit: Payer: Self-pay | Admitting: Cardiology

## 2013-08-11 ENCOUNTER — Ambulatory Visit: Payer: Medicare Other | Admitting: Pulmonary Disease

## 2013-08-11 DIAGNOSIS — I4891 Unspecified atrial fibrillation: Secondary | ICD-10-CM

## 2013-08-11 DIAGNOSIS — Z7901 Long term (current) use of anticoagulants: Secondary | ICD-10-CM

## 2013-08-11 LAB — POCT INR: INR: 2.2

## 2013-08-21 ENCOUNTER — Ambulatory Visit: Payer: Medicare Other | Admitting: Pulmonary Disease

## 2013-08-22 ENCOUNTER — Ambulatory Visit (INDEPENDENT_AMBULATORY_CARE_PROVIDER_SITE_OTHER): Payer: Medicare Other | Admitting: Pulmonary Disease

## 2013-08-22 ENCOUNTER — Encounter: Payer: Self-pay | Admitting: Pulmonary Disease

## 2013-08-22 VITALS — BP 128/62 | HR 54 | Temp 97.7°F | Ht 71.0 in | Wt 196.4 lb

## 2013-08-22 DIAGNOSIS — G4731 Primary central sleep apnea: Secondary | ICD-10-CM

## 2013-08-22 DIAGNOSIS — G4730 Sleep apnea, unspecified: Secondary | ICD-10-CM

## 2013-08-22 NOTE — Progress Notes (Signed)
   Subjective:    Patient ID: TRAVEION ROTUNDO, male    DOB: 1940/09/30, 73 y.o.   MRN: [id number]  HPI The patient comes in today for followup of his known complex sleep apnea. He is doing very well on his current CPAP setup, and his only complaint is related to humidity. He was not sure if he was allowed to change the heater setting. He feels that he sleeps well with the device, and is satisfied with his daytime alertness. His weight is down 6 pounds since last visit.   Review of Systems  Constitutional: Negative for fever and unexpected weight change.  HENT: Negative for congestion, dental problem, ear pain, nosebleeds, postnasal drip, rhinorrhea, sinus pressure, sneezing, sore throat and trouble swallowing.   Eyes: Negative for redness and itching.  Respiratory: Negative for cough, chest tightness, shortness of breath and wheezing.   Cardiovascular: Negative for palpitations and leg swelling.  Gastrointestinal: Negative for nausea and vomiting.  Genitourinary: Negative for dysuria.  Musculoskeletal: Negative for joint swelling.  Skin: Negative for rash.  Neurological: Negative for headaches.  Hematological: Does not bruise/bleed easily.  Psychiatric/Behavioral: Negative for dysphoric mood. The patient is not nervous/anxious.        Objective:   Physical Exam Well-developed male in no acute distress Nose without purulence or discharge noted No skin breakdown or pressure necrosis from the CPAP mass Neck without lymphadenopathy or thyromegaly Lower extremities without edema, no cyanosis Alert and oriented, moves all 4 extremities.       Assessment & Plan:

## 2013-08-22 NOTE — Patient Instructions (Signed)
Continue with cpap, and keep up with mask changes and supplies. Work on moderate weight loss Use heater to optimize your humidity followup with me again in one year if doing well.

## 2013-08-22 NOTE — Assessment & Plan Note (Signed)
The patient feels that he is doing very well on his current CPAP setup, and denies any issues with sleep disruption or daytime sleepiness. I have asked him to continue on his CPAP, and he can make adjustments to his humidity by changing the temperature. I've also asked him to keep up with mask cushion changes and supplies.

## 2013-08-27 ENCOUNTER — Encounter: Payer: Self-pay | Admitting: Internal Medicine

## 2013-09-04 ENCOUNTER — Ambulatory Visit: Payer: Medicare Other | Admitting: Internal Medicine

## 2013-09-04 ENCOUNTER — Telehealth: Payer: Self-pay | Admitting: Pulmonary Disease

## 2013-09-04 NOTE — Telephone Encounter (Signed)
error 

## 2013-09-08 ENCOUNTER — Telehealth: Payer: Self-pay | Admitting: Cardiology

## 2013-09-08 NOTE — Telephone Encounter (Signed)
Routed to [facility] for instruction on Coumadin dosage with Prednisone per conversation with Bethena Midget, RN

## 2013-09-08 NOTE — Telephone Encounter (Signed)
New message     Talk to a nurse regarding medication he is taking

## 2013-09-08 NOTE — Telephone Encounter (Signed)
Spoke with patient who states he has been given a Prednisone 6 day dose pack that he is to take over 12 days for hip inflammation by his orthorpedist.  Patient wants to verify with Dr. [name] that it is safe to take this medication along with his cardiac medications.  I advised that Dr. [name] is working in the hospital today and that I will send him the message and when he replies someone from our office will call him back.  Patient and wife verbalized understanding and agreement.

## 2013-09-08 NOTE — Telephone Encounter (Signed)
Called and spoke with pt and he is going to start Prednisone Dosepak tomorrow 09/09/2013 and pt instructed that we would need to check his [name] sooner so appt made for March 23rd  2015 and pt states understanding.

## 2013-09-08 NOTE — Telephone Encounter (Signed)
Okay to take the prednisone Dosepak.  Since it was for 12 days it may be worthwhile to check with the Coumadin clinic to see if any adjustments need to be made regarding his Coumadin.

## 2013-09-15 ENCOUNTER — Ambulatory Visit (INDEPENDENT_AMBULATORY_CARE_PROVIDER_SITE_OTHER): Payer: Medicare Other | Admitting: *Deleted

## 2013-09-15 DIAGNOSIS — Z5181 Encounter for therapeutic drug level monitoring: Secondary | ICD-10-CM | POA: Insufficient documentation

## 2013-09-15 DIAGNOSIS — Z7901 Long term (current) use of anticoagulants: Secondary | ICD-10-CM

## 2013-09-15 DIAGNOSIS — I4891 Unspecified atrial fibrillation: Secondary | ICD-10-CM

## 2013-09-15 LAB — POCT INR: INR: 2.1

## 2013-09-24 ENCOUNTER — Ambulatory Visit (INDEPENDENT_AMBULATORY_CARE_PROVIDER_SITE_OTHER): Payer: Medicare Other | Admitting: *Deleted

## 2013-09-24 DIAGNOSIS — Z7901 Long term (current) use of anticoagulants: Secondary | ICD-10-CM

## 2013-09-24 DIAGNOSIS — Z5181 Encounter for therapeutic drug level monitoring: Secondary | ICD-10-CM

## 2013-09-24 DIAGNOSIS — I4891 Unspecified atrial fibrillation: Secondary | ICD-10-CM

## 2013-09-24 LAB — POCT INR: INR: 2

## 2013-10-17 ENCOUNTER — Ambulatory Visit (INDEPENDENT_AMBULATORY_CARE_PROVIDER_SITE_OTHER): Payer: Medicare Other | Admitting: *Deleted

## 2013-10-17 ENCOUNTER — Ambulatory Visit (INDEPENDENT_AMBULATORY_CARE_PROVIDER_SITE_OTHER): Payer: Medicare Other | Admitting: Internal Medicine

## 2013-10-17 ENCOUNTER — Telehealth: Payer: Self-pay

## 2013-10-17 ENCOUNTER — Encounter: Payer: Self-pay | Admitting: Internal Medicine

## 2013-10-17 VITALS — BP 147/64 | HR 53 | Ht 71.0 in | Wt 194.0 lb

## 2013-10-17 DIAGNOSIS — I428 Other cardiomyopathies: Secondary | ICD-10-CM

## 2013-10-17 DIAGNOSIS — I4891 Unspecified atrial fibrillation: Secondary | ICD-10-CM

## 2013-10-17 DIAGNOSIS — I10 Essential (primary) hypertension: Secondary | ICD-10-CM

## 2013-10-17 DIAGNOSIS — Z7901 Long term (current) use of anticoagulants: Secondary | ICD-10-CM

## 2013-10-17 DIAGNOSIS — N189 Chronic kidney disease, unspecified: Secondary | ICD-10-CM

## 2013-10-17 DIAGNOSIS — Z5181 Encounter for therapeutic drug level monitoring: Secondary | ICD-10-CM

## 2013-10-17 LAB — POCT INR: INR: 2.5

## 2013-10-17 MED ORDER — AMIODARONE HCL 100 MG PO TABS: 100.0000 mg | ORAL_TABLET | Freq: Every day | ORAL | Status: DC

## 2013-10-17 NOTE — Telephone Encounter (Signed)
Pt saw [name] today in office, discussed started [facility] but wanted to verify cost to pt.  Called spoke with Damien Fusi, rep for [facility] since pt opted out of the Medicare D plan the pt's cost with card will be $4.00.  Called spoke to pt's wife advised, they are in the car at present will call back Monday to decide when they will come in to switch.  Last CrCl 44, will need low dose [name] to start.  Per [name] will repeat CBC, and BMP at time of OV to switch drug.  [name] will need to be equal to or less than 2.5. Will await pt call back.

## 2013-10-17 NOTE — Patient Instructions (Addendum)
Your physician has recommended you make the following change in your medication:  1) DECREASE Amiodarone to 100 mg daily  Your physician recommends that you schedule a follow-up appointment in: 4 months with Dr. [name].

## 2013-10-17 NOTE — Progress Notes (Signed)
PCP: [name], MD Primary Cardiologist: [name] is a 73 y.o. male who presents today for routine electrophysiology followup.  He is maintaining sinus rhythm with amiodarone.   He is tolerating this medicine without difficulty.  Today, he denies symptoms of  chest pain, shortness of breath,  lower extremity edema, dizziness, presyncope, or syncope.  The patient is otherwise without complaint today.   Past Medical History  Diagnosis Date  . Persistent atrial fibrillation     s/p PVI ablation 12-03-2012 by [name]  . Peripheral edema   . Mitral regurgitation     MILD  . Atrial dilatation     mild biatrial dilitation  . Nonischemic cardiomyopathy     presumed to be tachycardia mediated  . Hypertension   . Hypercholesterolemia   . Chronic kidney disease     baseline creatinine is 1.8  . GERD (gastroesophageal reflux disease)   . H/O hiatal hernia   . Arthritis    Past Surgical History  Procedure Laterality Date  . Lithotripsy      x 2  . Joint replacement  2005    total right hip replacement  . Hernia repair  03/2005  . Kidney stent  1987, 02/2005  . Quadrec      right leg quad sx  . Cardioversion N/A 09/04/2012    Procedure: CARDIOVERSION;  Surgeon: [name], MD;  Location: [facility] ENDOSCOPY;  Service: Cardiovascular;  Laterality: N/A;  . Tee without cardioversion N/A 12/02/2012    Procedure: TRANSESOPHAGEAL ECHOCARDIOGRAM (TEE);  Surgeon: [name], MD;  Location: [facility] ENDOSCOPY;  Service: Cardiovascular;  Laterality: N/A;  . Atrial fibrillation ablation  12-03-2012    PVI by [name]  . Cardioversion N/A 02/11/2013    Procedure: CARDIOVERSION;  Surgeon: [name], MD;  Location: [facility] ENDOSCOPY;  Service: Cardiovascular;  Laterality: N/A;    Current Outpatient Prescriptions  Medication Sig Dispense Refill  . amiodarone (PACERONE) 100 MG tablet Take 1 tablet (100 mg total) by mouth daily.  30 tablet  4  . Ascorbic Acid (VITAMIN C) 500 MG  tablet Take 1,500 mg by mouth daily.        . Cholecalciferol (VITAMIN D) 2000 UNITS CAPS Take 4,000 Units by mouth daily.        . Coenzyme Q10 (COQ10 PO) Take 100 mg by mouth daily.       . enalapril (VASOTEC) 20 MG tablet 1/2 TABLET DAILY      . furosemide (LASIX) 40 MG tablet 1 and 1/2 tablets daily      . guaiFENesin (MUCINEX) 600 MG 12 hr tablet Take 600 mg by mouth 2 (two) times daily as needed for congestion.      Marland Kitchen levothyroxine (SYNTHROID, LEVOTHROID) 50 MCG tablet Take one tablet by mouth one time daily  90 tablet  0  . Magnesium 200 MG TABS Take 1 tablet by mouth 2 (two) times daily. Magnesium Citrate      . metolazone (ZAROXOLYN) 5 MG tablet Take 1 tablet (5 mg total) by mouth as needed.  30 tablet  1  . metoprolol succinate (TOPROL-XL) 100 MG 24 hr tablet 1 TABLET IN THE MORNING AND 1/2 IN THE EVENING AFTER SUPPER  135 tablet  3  . Misc Natural Products (OSTEO BI-FLEX JOINT SHIELD PO) Take 2 tablets by mouth daily.       . Multiple Vitamin (MULTIVITAMIN) tablet Take 1 tablet by mouth daily.        . Omega-3 Fatty  Acids (FISH OIL) 1000 MG CAPS Take 1 capsule by mouth 2 (two) times daily.       Marland Kitchen. omeprazole (PRILOSEC) 20 MG capsule Take 1 capsule (20 mg total) by mouth daily as needed (for acid reflux).  90 capsule  3  . warfarin (COUMADIN) 5 MG tablet Take one tablet by mouth one time daily  90 tablet  1  . zinc gluconate 50 MG tablet Take 50 mg by mouth daily.         No current facility-administered medications for this visit.    Physical Exam: Filed Vitals:   10/17/13 0956  BP: 147/64  Pulse: 53  Height: 5\' 11"  (1.803 m)  Weight: 194 lb (87.998 kg)    GEN- The patient is well appearing, alert and oriented x 3 today.   Head- normocephalic, atraumatic Eyes-  R eye with conjunctival hemorrhage Ears- hearing intact Oropharynx- clear Lungs- Clear to ausculation bilaterally, normal work of breathing Heart- sinus rhythm, no murmurs, rubs or gallops, PMI not laterally  displaced GI- soft, NT, ND, + BS Extremities- no clubbing, cyanosis, or edema  ekg today reveals sinus rhythm 53 bpm, otherwise normal ekg  Assessment and Plan:  1. Persistent afib Maintaining sinus rhythm Decrease amiodarone to 100mg  daily Consider stopping amiodarone upon return Today, I discussed coumadin and novel anticoagulants including pradaxa, xarelto, and eliquis today as indicated for risk reduction in stroke and systemic emboli with nonvalvular atrial fibrillation.  Risks, benefits, and alternatives to each of these drugs were discussed at length today.  He would prefer NOAC therapy to not have to monitor INRs but is reluctant to do so as he is "self pay" for medicines.  I have confirmed with industry that for him, [name] (Edoxaban) will cost him only $4.  The patient will contemplate this option.  IF he decides to proceed then we will obtain bmet and cbc and initiate the appropriate dose based on renal function (he will likely need to 30 mg dose).  He will follow-up with our anticoagulation clinic to arrange anticoagulation.  I do think that this medicine would be a very good option for him long term.  2. CRI Limits our antiarrhythmic options  3. HTN Stable No change required today  4. Nonischemic CM euvolemic today  Return in 4 months Follow-up with Dr Patty SermonsBrackbill as scheduled

## 2013-10-20 NOTE — Telephone Encounter (Signed)
Follow up     Returning Charles [name]'s call

## 2013-10-20 NOTE — Telephone Encounter (Signed)
Called spoke with pt's wife, she states pt wishes to start taking [name] now.  Advised to hold tomorrow's dosage of Coumadin, then made appt for [name] check on Wednesday 10/22/13.  Needs CBC and BMP drawn at time of OV per [name].  Last CrCl per [name] was 44.  Will be starting pt on 30mg  QD.  2 weeks sample and discount cards bagged for pt to be given at time of OV.

## 2013-10-22 ENCOUNTER — Ambulatory Visit (INDEPENDENT_AMBULATORY_CARE_PROVIDER_SITE_OTHER): Payer: Medicare Other | Admitting: *Deleted

## 2013-10-22 DIAGNOSIS — Z5181 Encounter for therapeutic drug level monitoring: Secondary | ICD-10-CM

## 2013-10-22 DIAGNOSIS — Z7901 Long term (current) use of anticoagulants: Secondary | ICD-10-CM

## 2013-10-22 DIAGNOSIS — I4891 Unspecified atrial fibrillation: Secondary | ICD-10-CM

## 2013-10-22 LAB — BASIC METABOLIC PANEL
BUN: 26 mg/dL — AB (ref 6–23)
CO2: 31 meq/L (ref 19–32)
CREATININE: 1.3 mg/dL (ref 0.4–1.5)
Calcium: 9.8 mg/dL (ref 8.4–10.5)
Chloride: 97 mEq/L (ref 96–112)
GFR: 55.73 mL/min — ABNORMAL LOW (ref >60.00)
GLUCOSE: 98 mg/dL (ref 70–99)
POTASSIUM: 3.8 meq/L (ref 3.5–5.1)
Sodium: 136 mEq/L (ref 135–145)

## 2013-10-22 LAB — CBC
HCT: 38 % — ABNORMAL LOW (ref 39.0–52.0)
HEMOGLOBIN: 12.9 g/dL — AB (ref 13.0–17.0)
MCHC: 34 g/dL (ref 30.0–36.0)
MCV: 89.1 fl (ref 78.0–100.0)
PLATELETS: 229 10*3/uL (ref 150.0–400.0)
RBC: 4.27 Mil/uL (ref 4.22–5.81)
RDW: 14.8 % — ABNORMAL HIGH (ref 11.5–14.6)
WBC: 7.2 10*3/uL (ref 4.5–10.5)

## 2013-10-22 LAB — POCT INR: INR: 1.9

## 2013-10-22 MED ORDER — EDOXABAN TOSYLATE 60 MG PO TABS: 60.0000 | ORAL_TABLET | Freq: Every day | ORAL | Status: DC

## 2013-10-22 MED ORDER — EDOXABAN TOSYLATE 60 MG PO TABS: 1.0000 | ORAL_TABLET | Freq: Every day | ORAL | Status: DC

## 2013-10-22 NOTE — Patient Instructions (Signed)

## 2013-10-30 ENCOUNTER — Telehealth: Payer: Self-pay | Admitting: Internal Medicine

## 2013-10-30 NOTE — Telephone Encounter (Signed)
Patient changed from warfarin to [name] 1 week ago.  Patient tells me that he noticed a small amount dark red blood in his stool last night, and also bruising more on his arms, and even saw some bleeding from his arms this morning.  No known injury or trauma to this arm.  His CrCl is 61 ml/min, so the dose of [name] 60 mg daily is correct.  Patient denies any major fatigue and feels normal otherwise.  Given he is self pay, the other NOAC agents wouldn't be appropriate for him.  He is getting [name] with $4 coupon card.  His renal function has been less in the past, and if for some reason this declines again, the 30 mg dose may be appropriate and may lead to less bruising / bleeding.  Options include holding [name] for a few days to see if problem resolves, then consider changing back to warfarin, or re-challenging [name].    Patient notified to stop [name] today, and that I will call back tomorrow for status update on his bruising/bleeding.  To Dr. [name] as Lorain Childes.

## 2013-10-30 NOTE — Telephone Encounter (Signed)
New message           Pt says he had some blood in his stool last night and also his arms are easy to bleed. Is this due to a change in medication?

## 2013-10-30 NOTE — Telephone Encounter (Signed)
Agree with plan 

## 2013-10-31 ENCOUNTER — Telehealth: Payer: Self-pay | Admitting: Cardiology

## 2013-10-31 NOTE — Telephone Encounter (Signed)
Patient reports that the bleeding from his arms and blood in his stool has resolved over past 24 hours.  I advised patient to remain off [name] for one more day, and to restart tomorrow evening.  I explained that if the bleeding restarts, he will need to change back to warfarin.  Patient is agreeable to this, and will call back if he notices any further bleeding issues. To Dr. [name] as Lorain Childes.

## 2013-10-31 NOTE — Telephone Encounter (Signed)
Received request from Nurse fax box, documents faxed for surgical clearance. °To:  Orthopaedics °Fax number: [phone] °Attention: °[phone]/[phone] °

## 2013-11-04 ENCOUNTER — Other Ambulatory Visit: Payer: Self-pay | Admitting: Surgical

## 2013-11-05 ENCOUNTER — Encounter (HOSPITAL_COMMUNITY): Payer: Self-pay | Admitting: Pharmacy Technician

## 2013-11-07 ENCOUNTER — Other Ambulatory Visit: Payer: Self-pay | Admitting: Cardiology

## 2013-11-11 ENCOUNTER — Ambulatory Visit (HOSPITAL_COMMUNITY)
Admission: RE | Admit: 2013-11-11 | Discharge: 2013-11-11 | Disposition: A | Discharge status: Home / Self Care | Payer: Medicare Other | Source: Ambulatory Visit | Attending: Anesthesiology | Admitting: Anesthesiology

## 2013-11-11 ENCOUNTER — Encounter (HOSPITAL_COMMUNITY): Payer: Self-pay

## 2013-11-11 ENCOUNTER — Encounter (HOSPITAL_COMMUNITY)
Admission: RE | Admit: 2013-11-11 | Discharge: 2013-11-11 | Disposition: A | Discharge status: Home / Self Care | Payer: Medicare Other | Source: Ambulatory Visit | Attending: Orthopedic Surgery | Admitting: Orthopedic Surgery

## 2013-11-11 DIAGNOSIS — R091 Pleurisy: Secondary | ICD-10-CM | POA: Insufficient documentation

## 2013-11-11 HISTORY — DX: Other tear of medial meniscus, current injury, left knee, initial encounter: S83.242A

## 2013-11-11 HISTORY — DX: Spontaneous ecchymoses: R23.3

## 2013-11-11 HISTORY — DX: Other skin changes: R23.8

## 2013-11-11 HISTORY — DX: Sleep apnea, unspecified: G47.30

## 2013-11-11 LAB — BASIC METABOLIC PANEL
BUN: 24 mg/dL — AB (ref 6–23)
CO2: 30 mEq/L (ref 19–32)
CREATININE: 1.33 mg/dL (ref 0.50–1.35)
Calcium: 10.1 mg/dL (ref 8.4–10.5)
Chloride: 97 mEq/L (ref 96–112)
GFR calc Af Amer: 59 mL/min — ABNORMAL LOW (ref >90)
GFR calc non Af Amer: 51 mL/min — ABNORMAL LOW (ref >90)
Glucose, Bld: 97 mg/dL (ref 70–99)
Potassium: 4 mEq/L (ref 3.7–5.3)
Sodium: 140 mEq/L (ref 137–147)

## 2013-11-11 LAB — CBC
HEMATOCRIT: 35 % — AB (ref 39.0–52.0)
Hemoglobin: 11.8 g/dL — ABNORMAL LOW (ref 13.0–17.0)
MCH: 30.5 pg (ref 26.0–34.0)
MCHC: 33.7 g/dL (ref 30.0–36.0)
MCV: 90.4 fL (ref 78.0–100.0)
Platelets: 194 10*3/uL (ref 150–400)
RBC: 3.87 MIL/uL — ABNORMAL LOW (ref 4.22–5.81)
RDW: 14.4 % (ref 11.5–15.5)
WBC: 6.2 10*3/uL (ref 4.0–10.5)

## 2013-11-11 LAB — PROTIME-INR
INR: 1.36 (ref 0.00–1.49)
Prothrombin Time: 16.4 seconds — ABNORMAL HIGH (ref 11.6–15.2)

## 2013-11-11 LAB — APTT: aPTT: 33 seconds (ref 24–37)

## 2013-11-11 NOTE — Patient Instructions (Addendum)
Charles ImusWilliam B [name]  11/11/2013                           YOUR PROCEDURE IS SCHEDULED ON: 11/20/13 AT 2:00 PM               ENTER THRU [place] MAIN HOSPITAL ENTRANCE AND                           FOLLOW  SIGNS TO SHORT STAY CENTER                 ARRIVE AT SHORT STAY AT: 12:00 PM               CALL THIS NUMBER IF ANY PROBLEMS THE DAY OF SURGERY :               [phone]                                REMEMBER:   Do not eat food  AFTER MIDNIGHT   May have clear liquids UNTIL 6 HOURS BEFORE SURGERY 8:00 AM               Take these medicines the morning of surgery with               A SIPS OF WATER :    AMIODORONE / LEVOTHYROXINE / METOPROLOL / OMEPRAZOLE     Do not wear jewelry, make-up   Do not wear lotions, powders, or perfumes.   Do not shave legs or underarms 12 hrs. before surgery (men may shave face)  Do not bring valuables to the hospital.  Contacts, dentures or bridgework may not be worn into surgery.  Leave suitcase in the car. After surgery it may be brought to your room.  For patients admitted to the hospital more than one night, checkout time is            11:00 AM                                                       The day of discharge.   Patients discharged the day of surgery will not be allowed to drive home.            If going home same day of surgery, must have someone stay with you              FIRST 24 hrs at home and arrange for some one to drive you              home from hospital.   ________________________________________________________________________  [place] - Preparing for Surgery Before surgery, you can play an important role.  Because skin is not sterile, your skin needs to be as free of germs as possible.  You can reduce the number of germs on your skin by washing with CHG (chlorahexidine gluconate)  soap before surgery.  CHG is an antiseptic cleaner which kills germs and bonds with the skin to continue killing germs even after washing. Please DO NOT use if you have an allergy to CHG or antibacterial soaps.  If your skin becomes reddened/irritated stop using the CHG and inform your nurse when you arrive at Short Stay. Do not shave (including legs and underarms) for at least 48 hours prior to the first CHG shower.  You may shave your face. Please follow these instructions carefully:  1.  Shower with CHG Soap the night before surgery and the  morning of Surgery.   2.  If you choose to wash your hair, wash your hair first as usual with your  normal  Shampoo.   3.  After you shampoo, rinse your hair and body thoroughly to remove the  shampoo.                                         4.  Use CHG as you would any other liquid soap.  You can apply chg directly  to the skin and wash . Gently wash with scrungie or clean wascloth    5.  Apply the CHG Soap to your body ONLY FROM THE NECK DOWN.   Do not use on open                           Wound or open sores. Avoid contact with eyes, ears mouth and genitals (private parts).                        Genitals (private parts) with your normal soap.              6.  Wash thoroughly, paying special attention to the area where your surgery  will be performed.   7.  Thoroughly rinse your body with warm water from the neck down.   8.  DO NOT shower/wash with your normal soap after using and rinsing off  the CHG Soap .                9.  Pat yourself dry with a clean towel.             10.  Wear clean pajamas.             11.  Place clean sheets on your bed the night of your first shower and do not  sleep with pets.  Day of Surgery : Do not apply any lotions/deodorants the morning of surgery.  Please wear clean clothes to the hospital/surgery center.  FAILURE TO FOLLOW THESE INSTRUCTIONS MAY RESULT IN THE CANCELLATION OF YOUR SURGERY    PATIENT  SIGNATURE_________________________________   BRING C PAP AND TUBING      CLEAR LIQUID DIET   Foods Allowed  Foods Excluded  Coffee and tea, regular and decaf                             liquids that you cannot  Plain Jell-O in any flavor                                             see through such as: Fruit ices (not with fruit pulp)                                     milk, soups, orange juice  Iced Popsicles                                    All solid food Carbonated beverages, regular and diet                                    Cranberry, grape and apple juices Sports drinks like Gatorade Lightly seasoned clear broth or consume(fat free) Sugar, honey syrup

## 2013-11-20 ENCOUNTER — Ambulatory Visit (HOSPITAL_COMMUNITY)
Admission: RE | Admit: 2013-11-20 | Discharge: 2013-11-20 | Disposition: A | Discharge status: Home / Self Care | Payer: Medicare Other | Source: Ambulatory Visit | Attending: Orthopedic Surgery | Admitting: Orthopedic Surgery

## 2013-11-20 ENCOUNTER — Encounter (HOSPITAL_COMMUNITY): Payer: Self-pay | Admitting: *Deleted

## 2013-11-20 ENCOUNTER — Encounter (HOSPITAL_COMMUNITY)
Admission: RE | Disposition: A | Discharge status: Home / Self Care | Payer: Self-pay | Source: Ambulatory Visit | Attending: Orthopedic Surgery

## 2013-11-20 ENCOUNTER — Encounter (HOSPITAL_COMMUNITY): Payer: Medicare Other | Admitting: Anesthesiology

## 2013-11-20 ENCOUNTER — Ambulatory Visit (HOSPITAL_COMMUNITY): Payer: Medicare Other | Admitting: Anesthesiology

## 2013-11-20 DIAGNOSIS — E039 Hypothyroidism, unspecified: Secondary | ICD-10-CM | POA: Insufficient documentation

## 2013-11-20 DIAGNOSIS — G4730 Sleep apnea, unspecified: Secondary | ICD-10-CM | POA: Insufficient documentation

## 2013-11-20 DIAGNOSIS — K449 Diaphragmatic hernia without obstruction or gangrene: Secondary | ICD-10-CM | POA: Insufficient documentation

## 2013-11-20 DIAGNOSIS — M23302 Other meniscus derangements, unspecified lateral meniscus, unspecified knee: Secondary | ICD-10-CM | POA: Insufficient documentation

## 2013-11-20 DIAGNOSIS — Z96649 Presence of unspecified artificial hip joint: Secondary | ICD-10-CM | POA: Insufficient documentation

## 2013-11-20 DIAGNOSIS — I509 Heart failure, unspecified: Secondary | ICD-10-CM | POA: Insufficient documentation

## 2013-11-20 DIAGNOSIS — M1712 Unilateral primary osteoarthritis, left knee: Secondary | ICD-10-CM | POA: Diagnosis present

## 2013-11-20 DIAGNOSIS — K219 Gastro-esophageal reflux disease without esophagitis: Secondary | ICD-10-CM | POA: Insufficient documentation

## 2013-11-20 DIAGNOSIS — Z79899 Other long term (current) drug therapy: Secondary | ICD-10-CM | POA: Insufficient documentation

## 2013-11-20 DIAGNOSIS — S83232A Complex tear of medial meniscus, current injury, left knee, initial encounter: Secondary | ICD-10-CM | POA: Diagnosis present

## 2013-11-20 DIAGNOSIS — I428 Other cardiomyopathies: Secondary | ICD-10-CM | POA: Insufficient documentation

## 2013-11-20 DIAGNOSIS — N189 Chronic kidney disease, unspecified: Secondary | ICD-10-CM | POA: Insufficient documentation

## 2013-11-20 DIAGNOSIS — I4891 Unspecified atrial fibrillation: Secondary | ICD-10-CM | POA: Insufficient documentation

## 2013-11-20 DIAGNOSIS — I129 Hypertensive chronic kidney disease with stage 1 through stage 4 chronic kidney disease, or unspecified chronic kidney disease: Secondary | ICD-10-CM | POA: Insufficient documentation

## 2013-11-20 DIAGNOSIS — M1710 Unilateral primary osteoarthritis, unspecified knee: Secondary | ICD-10-CM | POA: Insufficient documentation

## 2013-11-20 HISTORY — PX: KNEE ARTHROSCOPY WITH DRILLING/MICROFRACTURE: SHX6425

## 2013-11-20 HISTORY — PX: KNEE ARTHROSCOPY WITH MEDIAL MENISECTOMY: SHX5651

## 2013-11-20 LAB — PROTIME-INR
INR: 1.06 (ref 0.00–1.49)
Prothrombin Time: 13.6 seconds (ref 11.6–15.2)

## 2013-11-20 SURGERY — ARTHROSCOPY, KNEE, WITH MEDIAL MENISCECTOMY
Anesthesia: General | Site: Knee | Laterality: Left

## 2013-11-20 MED ORDER — EPHEDRINE SULFATE 50 MG/ML IJ SOLN
INTRAMUSCULAR | Status: AC
  Filled 2013-11-20: qty 1

## 2013-11-20 MED ORDER — LACTATED RINGERS IV SOLN
INTRAVENOUS | Status: DC
  Administered 2013-11-20: 1000 mL via INTRAVENOUS

## 2013-11-20 MED ORDER — BUPIVACAINE-EPINEPHRINE 0.25% -1:200000 IJ SOLN
INTRAMUSCULAR | Status: DC | PRN
  Administered 2013-11-20: 30 mL

## 2013-11-20 MED ORDER — OXYCODONE-ACETAMINOPHEN 10-325 MG PO TABS: 1.0000 | ORAL_TABLET | ORAL | Status: DC | PRN

## 2013-11-20 MED ORDER — PROMETHAZINE HCL 25 MG/ML IJ SOLN: 6.2500 mg | INTRAMUSCULAR | Status: DC | PRN

## 2013-11-20 MED ORDER — CLINDAMYCIN PHOSPHATE 900 MG/50ML IV SOLN
INTRAVENOUS | Status: AC
  Filled 2013-11-20: qty 50

## 2013-11-20 MED ORDER — CLINDAMYCIN PHOSPHATE 900 MG/50ML IV SOLN
900.0000 mg | INTRAVENOUS | Status: AC
  Administered 2013-11-20: 900 mg via INTRAVENOUS

## 2013-11-20 MED ORDER — EPHEDRINE SULFATE 50 MG/ML IJ SOLN
INTRAMUSCULAR | Status: DC | PRN
  Administered 2013-11-20 (×2): 5 mg via INTRAVENOUS

## 2013-11-20 MED ORDER — LACTATED RINGERS IR SOLN
Status: DC | PRN
Start: 2013-11-20 — End: 2013-11-20

## 2013-11-20 MED ORDER — BACITRACIN-NEOMYCIN-POLYMYXIN 400-5-5000 EX OINT
TOPICAL_OINTMENT | CUTANEOUS | Status: AC
  Filled 2013-11-20: qty 1

## 2013-11-20 MED ORDER — FENTANYL CITRATE 0.05 MG/ML IJ SOLN
INTRAMUSCULAR | Status: AC
  Filled 2013-11-20: qty 5

## 2013-11-20 MED ORDER — FENTANYL CITRATE 0.05 MG/ML IJ SOLN: 25.0000 ug | INTRAMUSCULAR | Status: DC | PRN

## 2013-11-20 MED ORDER — BACITRACIN ZINC 500 UNIT/GM EX OINT
TOPICAL_OINTMENT | CUTANEOUS | Status: DC | PRN
  Administered 2013-11-20: 1 via TOPICAL

## 2013-11-20 MED ORDER — BUPIVACAINE-EPINEPHRINE (PF) 0.25% -1:200000 IJ SOLN
INTRAMUSCULAR | Status: AC
  Filled 2013-11-20: qty 30

## 2013-11-20 MED ORDER — FENTANYL CITRATE 0.05 MG/ML IJ SOLN
INTRAMUSCULAR | Status: DC | PRN
  Administered 2013-11-20: 100 ug via INTRAVENOUS
  Administered 2013-11-20: 50 ug via INTRAVENOUS

## 2013-11-20 MED ORDER — PROPOFOL 10 MG/ML IV BOLUS
INTRAVENOUS | Status: DC | PRN
  Administered 2013-11-20: 150 mg via INTRAVENOUS

## 2013-11-20 MED ORDER — LACTATED RINGERS IR SOLN
Status: DC | PRN
  Administered 2013-11-20: 6000 mL

## 2013-11-20 MED ORDER — PROPOFOL 10 MG/ML IV BOLUS
INTRAVENOUS | Status: AC
  Filled 2013-11-20: qty 20

## 2013-11-20 MED ORDER — LACTATED RINGERS IV SOLN
INTRAVENOUS | Status: DC
Start: 2013-11-20 — End: 2013-11-20

## 2013-11-20 SURGICAL SUPPLY — 25 items
BANDAGE ELASTIC 4 VELCRO ST LF (GAUZE/BANDAGES/DRESSINGS) ×2 IMPLANT
BANDAGE GAUZE ELAST BULKY 4 IN (GAUZE/BANDAGES/DRESSINGS) ×2 IMPLANT
BLADE GREAT WHITE 4.2 (BLADE) IMPLANT
BNDG COHESIVE 6X5 TAN STRL LF (GAUZE/BANDAGES/DRESSINGS) ×2 IMPLANT
DRAPE LG THREE QUARTER DISP (DRAPES) ×2 IMPLANT
DRSG ADAPTIC 3X8 NADH LF (GAUZE/BANDAGES/DRESSINGS) ×2 IMPLANT
DRSG PAD ABDOMINAL 8X10 ST (GAUZE/BANDAGES/DRESSINGS) IMPLANT
DURAPREP 26ML APPLICATOR (WOUND CARE) ×2 IMPLANT
GLOVE BIOGEL PI IND STRL 8 (GLOVE) ×1 IMPLANT
GLOVE BIOGEL PI INDICATOR 8 (GLOVE) ×1
GLOVE ECLIPSE 8.0 STRL XLNG CF (GLOVE) ×2 IMPLANT
GOWN STRL REUS W/TWL XL LVL3 (GOWN DISPOSABLE) ×2 IMPLANT
MANIFOLD NEPTUNE II (INSTRUMENTS) ×2 IMPLANT
PACK ARTHROSCOPY WL (CUSTOM PROCEDURE TRAY) ×2 IMPLANT
PACK ICE MAXI GEL EZY WRAP (MISCELLANEOUS) ×2 IMPLANT
PAD ABD 8X10 STRL (GAUZE/BANDAGES/DRESSINGS) ×4 IMPLANT
PAD MASON LEG HOLDER (PIN) ×2 IMPLANT
SET ARTHROSCOPY TUBING (MISCELLANEOUS) ×1
SET ARTHROSCOPY TUBING LN (MISCELLANEOUS) ×1 IMPLANT
SPONGE GAUZE 4X4 12PLY (GAUZE/BANDAGES/DRESSINGS) ×2 IMPLANT
SUT ETHILON 3 0 PS 1 (SUTURE) ×2 IMPLANT
TOWEL OR 17X26 10 PK STRL BLUE (TOWEL DISPOSABLE) ×2 IMPLANT
TUBING CONNECTING 10 (TUBING) ×2 IMPLANT
WAND 90 DEG TURBOVAC W/CORD (SURGICAL WAND) ×2 IMPLANT
WRAP KNEE MAXI GEL POST OP (GAUZE/BANDAGES/DRESSINGS) ×2 IMPLANT

## 2013-11-20 NOTE — Brief Op Note (Signed)
11/20/2013  3:21 PM  PATIENT:  Charles [name]  73 y.o. male  PRE-OPERATIVE DIAGNOSIS:  LEFT KNEE MEDIAL MENISCUS TEAR and Osteoarthritis  POST-OPERATIVE DIAGNOSIS:  LEFT KNEE MEDIAL MENISCUS TEAR and Osteoarthritis  PROCEDURE:  Procedure(s) with comments: LEFT KNEE ARTHROSCOPY WITH MEDIAL MENISECTOMY (Left) - Microfracture of medial femoral chondyle and abrasion chondroplasty of the medial femoral chondyle KNEE ARTHROSCOPY WITH DRILLING/MICROFRACTURE (Left).Abraision Chondroplasty of Patella.  SURGEON:  Surgeon(s) and Role:    * Jacki Cones, MD - Primary  :   ASSISTANTS:OR Tech  ANESTHESIA:   general  EBL:  Total I/O In: 900 [I.V.:900] Out: -   BLOOD ADMINISTERED:none  DRAINS: none   LOCAL MEDICATIONS USED:  MARCAINE 30cc of 0.25% with Epinephrine.     SPECIMEN:  No Specimen  DISPOSITION OF SPECIMEN:  N/A  COUNTS:  YES  TOURNIQUET:  * No tourniquets in log *  DICTATION: .Other Dictation: Dictation Number [id number]  PLAN OF CARE: Discharge to home after PACU  PATIENT DISPOSITION:  PACU - hemodynamically stable.   Delay start of Pharmacological VTE agent (>24hrs) due to surgical blood loss or risk of bleeding: yes

## 2013-11-20 NOTE — Interval H&P Note (Signed)
History and Physical Interval Note:  11/20/2013 2:08 PM  Charles [name]  has presented today for surgery, with the diagnosis of LEFT KNEE MEDIAL MENISCUS TEAR  The various methods of treatment have been discussed with the patient and family. After consideration of risks, benefits and other options for treatment, the patient has consented to  Procedure(s): LEFT KNEE ARTHROSCOPY WITH MEDIAL MENISECTOMY (Left) as a surgical intervention .  The patient's history has been reviewed, patient examined, no change in status, stable for surgery.  I have reviewed the patient's chart and labs.  Questions were answered to the patient's satisfaction.     Jacki Cones

## 2013-11-20 NOTE — H&P (Signed)
Charles [name] is an 73 y.o. male.   Chief Complaint: Pain and swelling in Left Knee. HPI: He developed Pain,Swelling and Popping in his Left Knee  Past Medical History  Diagnosis Date  . Persistent atrial fibrillation     s/p PVI ablation 12-03-2012 by [name]  . Peripheral edema   . Mitral regurgitation     MILD  . Atrial dilatation     mild biatrial dilitation  . Nonischemic cardiomyopathy     presumed to be tachycardia mediated  . Hypertension   . Hypercholesterolemia   . Chronic kidney disease     baseline creatinine is 1.8  . GERD (gastroesophageal reflux disease)   . H/O hiatal hernia   . Arthritis   . Tear of medial meniscus of left knee   . Bruises easily   . Thyroid disease   . Sleep apnea     USES C PAP    Past Surgical History  Procedure Laterality Date  . Lithotripsy      x 2  . Joint replacement  2005    total right hip replacement  . Hernia repair  03/2005  . Kidney stent  1987, 02/2005  . Quadrec  1999    LFT leg quad sx  . Cardioversion N/A 09/04/2012    Procedure: CARDIOVERSION;  Surgeon: [name], MD;  Location: [facility] ENDOSCOPY;  Service: Cardiovascular;  Laterality: N/A;  . Tee without cardioversion N/A 12/02/2012    Procedure: TRANSESOPHAGEAL ECHOCARDIOGRAM (TEE);  Surgeon: [name], MD;  Location: [facility] ENDOSCOPY;  Service: Cardiovascular;  Laterality: N/A;  . Atrial fibrillation ablation  12-03-2012    PVI by [name]  . Cardioversion N/A 02/11/2013    Procedure: CARDIOVERSION;  Surgeon: [name], MD;  Location: [facility] ENDOSCOPY;  Service: Cardiovascular;  Laterality: N/A;    Family History  Problem Relation Age of Onset  . Cancer Mother 47    CANCER  . Heart disease Father 98    HEART PROBLEMS   Social History:  reports that he has never smoked. He has never used smokeless tobacco. He reports that he does not drink alcohol or use illicit drugs.  Allergies:  Allergies  Allergen Reactions  . Ceftriaxone Sodium Rash   Rocephin  . Diltiazem Hcl Rash    Cardizem  . Penicillins Rash    Medications Prior to Admission  Medication Sig Dispense Refill  . amiodarone (PACERONE) 200 MG tablet Take 100 mg by mouth every morning.      . enalapril (VASOTEC) 20 MG tablet Take 10 mg by mouth every evening. 1/2 TABLET DAILY      . furosemide (LASIX) 40 MG tablet Take 60 mg by mouth daily. 1 and 1/2 tablets daily      . levothyroxine (SYNTHROID, LEVOTHROID) 50 MCG tablet Take 50 mcg by mouth daily before breakfast.      . metolazone (ZAROXOLYN) 5 MG tablet Take 5 mg by mouth every morning.      . metoprolol succinate (TOPROL-XL) 100 MG 24 hr tablet Take 50-100 mg by mouth 2 (two) times daily.      Marland Kitchen omeprazole (PRILOSEC) 20 MG capsule Take 20 mg by mouth daily.      . Ascorbic Acid (VITAMIN C) 500 MG tablet Take 1,500 mg by mouth daily.        . Cholecalciferol (VITAMIN D) 2000 UNITS CAPS Take 4,000 Units by mouth daily.        . Coenzyme Q10 (COQ10 PO) Take 100 mg by  mouth daily.       . Edoxaban Tosylate ([name]) 60 MG TABS Take 60 mg by mouth every evening.      Marland Kitchen. guaiFENesin (MUCINEX) 600 MG 12 hr tablet Take 600 mg by mouth 2 (two) times daily as needed for congestion.      Marland Kitchen. levothyroxine (SYNTHROID, LEVOTHROID) 50 MCG tablet TAKE ONE TABLET BY MOUTH ONE TIME DAILY   30 tablet  0  . MAGNESIUM CITRATE PO Take 400 mg by mouth 2 (two) times daily.      . Misc Natural Products (OSTEO BI-FLEX JOINT SHIELD PO) Take 2 tablets by mouth daily.       . Multiple Vitamin (MULTIVITAMIN) tablet Take 1 tablet by mouth daily.        . Omega-3 Fatty Acids (FISH OIL) 1000 MG CAPS Take 1 capsule by mouth 2 (two) times daily.       Marland Kitchen. zinc gluconate 50 MG tablet Take 50 mg by mouth daily.          Results for orders placed during the hospital encounter of 11/20/13 (from the past 48 hour(s))  PROTIME-[name]     Status: None   Collection Time    11/20/13 12:00 PM      Result Value Ref Range   Prothrombin Time 13.6  11.6 - 15.2  seconds   [name] 1.06  0.00 - 1.49   No results found.  Review of Systems  Constitutional: Negative.   HENT: Negative.   Eyes: Negative.   Respiratory: Negative.   Cardiovascular: Negative.   Gastrointestinal: Negative.   Genitourinary: Negative.   Musculoskeletal: Positive for joint pain.  Skin: Negative.   Neurological: Negative.   Endo/Heme/Allergies: Negative.   Psychiatric/Behavioral: Negative.     Blood pressure 153/63, pulse 54, temperature 97.1 F (36.2 C), temperature source Oral, resp. rate 18, SpO2 100.00%. Physical Exam  Constitutional: He appears well-developed.  Eyes: Pupils are equal, round, and reactive to light.  Neck: Normal range of motion.  Cardiovascular: Normal rate.   Respiratory: Effort normal.  GI: Soft.  Musculoskeletal:  Pain and swelling in Left Knee.  Neurological: He is alert.  Skin: Skin is warm.  Psychiatric: He has a normal mood and affect.     Assessment/Plan Left Knee Arthroscopic Medial meniscectomy.  Georges LynchRonald A Maleia Weems 11/20/2013, 2:04 PM

## 2013-11-20 NOTE — Transfer of Care (Signed)
Immediate Anesthesia Transfer of Care Note  Patient: Charles [name]  Procedure(s) Performed: Procedure(s) with comments: LEFT KNEE ARTHROSCOPY WITH MEDIAL MENISECTOMY (Left) - Microfracture of medial femoral chondyle and abrasion chondroplasty of the medial femoral chondyle KNEE ARTHROSCOPY WITH DRILLING/MICROFRACTURE (Left)  Patient Location: PACU  Anesthesia Type:General  Level of Consciousness: awake, alert  and oriented  Airway & Oxygen Therapy: Patient Spontanous Breathing and Patient connected to face mask oxygen  Post-op Assessment: Report given to PACU RN and Post -op Vital signs reviewed and stable  Post vital signs: Reviewed and stable  Complications: No apparent anesthesia complications

## 2013-11-20 NOTE — Anesthesia Postprocedure Evaluation (Signed)
  Anesthesia Post-op Note  Patient: Charles [name]  Procedure(s) Performed: Procedure(s) (LRB): LEFT KNEE ARTHROSCOPY WITH MEDIAL MENISECTOMY (Left) KNEE ARTHROSCOPY WITH DRILLING/MICROFRACTURE (Left)  Patient Location: PACU  Anesthesia Type: General  Level of Consciousness: awake and alert   Airway and Oxygen Therapy: Patient Spontanous Breathing  Post-op Pain: mild  Post-op Assessment: Post-op Vital signs reviewed, Patient's Cardiovascular Status Stable, Respiratory Function Stable, Patent Airway and No signs of Nausea or vomiting  Last Vitals:  Filed Vitals:   11/20/13 1620  BP: 155/58  Pulse: 57  Temp:   Resp: 16    Post-op Vital Signs: stable   Complications: No apparent anesthesia complications

## 2013-11-20 NOTE — Anesthesia Preprocedure Evaluation (Addendum)
Anesthesia Evaluation  Patient identified by MRN, date of birth, ID band Patient awake    Reviewed: Allergy & Precautions, H&P , NPO status , Patient's Chart, lab work & pertinent test results  Airway Mallampati: II TM Distance: >3 FB Neck ROM: Full    Dental no notable dental hx.    Pulmonary shortness of breath, sleep apnea ,  breath sounds clear to auscultation  Pulmonary exam normal       Cardiovascular hypertension, Pt. on medications and Pt. on home beta blockers +CHF and + Orthopnea + dysrhythmias Atrial Fibrillation Rhythm:Regular Rate:Normal  ECHO 2014: LVEF moderately depressed.   Neuro/Psych negative neurological ROS  negative psych ROS   GI/Hepatic Neg liver ROS, hiatal hernia, GERD-  Medicated,  Endo/Other  Hypothyroidism   Renal/GU Renal disease  negative genitourinary   Musculoskeletal negative musculoskeletal ROS (+)   Abdominal   Peds negative pediatric ROS (+)  Hematology negative hematology ROS (+)   Anesthesia Other Findings   Reproductive/Obstetrics negative OB ROS                          Anesthesia Physical Anesthesia Plan  ASA: III  Anesthesia Plan: General   Post-op Pain Management:    Induction: Intravenous  Airway Management Planned: LMA  Additional Equipment:   Intra-op Plan:   Post-operative Plan: Extubation in OR  Informed Consent: I have reviewed the patients History and Physical, chart, labs and discussed the procedure including the risks, benefits and alternatives for the proposed anesthesia with the patient or authorized representative who has indicated his/her understanding and acceptance.   Dental advisory given  Plan Discussed with: CRNA  Anesthesia Plan Comments:         Anesthesia Quick Evaluation

## 2013-11-21 ENCOUNTER — Encounter (HOSPITAL_COMMUNITY): Payer: Self-pay | Admitting: Orthopedic Surgery

## 2013-11-21 NOTE — Op Note (Signed)
NAME:  CAROS, MERGEL NO.:  [id number]  MEDICAL RECORD NO.:  [id number]  LOCATION:  [facility]                         FACILITY:  [facility]  PHYSICIAN:  Georges Lynch. Atilano Covelli, M.D.DATE OF BIRTH:  1940/09/04  DATE OF PROCEDURE:  11/20/2013 DATE OF DISCHARGE:  11/20/2013                              OPERATIVE REPORT   SURGEON:  Windy Fast A. Aikam Hellickson, M.D.  ASSISTANT:  OR Tech.  PREOPERATIVE DIAGNOSES: 1. Osteoarthritis, left knee. 2. Complex tear of the medial meniscus, left knee.  POSTOPERATIVE DIAGNOSES: 1. Osteoarthritis, left knee. 2. Complex tear of the medial meniscus, left knee.  OPERATION: 1. Diagnostic arthroscopy, left knee. 2. Medial meniscectomy of the posterior horn, medial meniscus, left     knee. 3. Abrasion chondroplasty of the patella. 4. Abrasion chondroplasty of the medial femoral condyle. 5. Microfracture technique medial femoral condyle.  PROCEDURE:  Under general anesthesia, routine orthopedic prep and draping of the left lower extremity was carried out.  First, the appropriate time-out was carried out.  I also marked the appropriate left leg in the holding area.  Since he was allergic to penicillin, we gave him clindamycin 950 mL.  The procedure under general anesthesia as I mentioned, small punctate incision was made after we did the appropriate time-out and at time-out, the incision was made in the suprapatellar pouch.  Laterally, note he had previous surgery on his quadriceps tendon.  His knee was extremely tight and extremely scarred down.  After making a small punctate incisions in the lateral suprapatellar region, I inserted the inflow cannula and the knee was distended with saline.  Another small incision was made in the anterolateral joint.  The arthroscope was entered from lateral approach and a complete diagnostic arthroscopy was carried out.  At this particular time, the wound up in the suprapatellar pouch.  He had chondromalacia of the  patella.  I did an abrasion chondroplasty of the patella.  I went down in the lateral joint.  He had some very small peripheral tears of the medial lateral meniscus, early arthritis, no meniscectomy was necessary.  Cruciates, he had a partial tear of the anterior cruciate medially.  The remaining part of the cruciate was intact.  The medial joint was the main highlight, main problem.  I had to do an abrasion chondroplasty of the medial femoral condyle followed by a microfracture technique.  He had significant arthritic changes in the medial joint.  He had a complex tear of the medial meniscus posteriorly.  I did a partial medial meniscectomy at this time as well. Thoroughly irrigated out the knee, removed all the fluid, closed all 3 punctate incisions with 3-0 nylon suture.  I injected 30 mL of 0.25% Marcaine with epinephrine in the knee joint at 30 mL.  Sterile Neosporin dressings were applied.  Postop, he will be on the appropriate aspirin regimen twice a day for 2 weeks as an anticoagulant.  He will be seen in the office 10-12 days or prior to if there is a problem.          ______________________________ Georges Lynch Darrelyn Hillock, M.D.     RAG/MEDQ  D:  11/20/2013  T:  11/21/2013  Job:  [id number]

## 2013-11-24 ENCOUNTER — Ambulatory Visit (INDEPENDENT_AMBULATORY_CARE_PROVIDER_SITE_OTHER): Payer: Medicare Other | Admitting: Pharmacist

## 2013-11-24 ENCOUNTER — Other Ambulatory Visit: Payer: Medicare Other

## 2013-11-24 VITALS — Wt 186.0 lb

## 2013-11-24 DIAGNOSIS — I4891 Unspecified atrial fibrillation: Secondary | ICD-10-CM

## 2013-11-24 DIAGNOSIS — Z79899 Other long term (current) drug therapy: Secondary | ICD-10-CM

## 2013-11-24 LAB — CBC
HCT: 35.6 % — ABNORMAL LOW (ref 39.0–52.0)
Hemoglobin: 11.9 g/dL — ABNORMAL LOW (ref 13.0–17.0)
MCHC: 33.3 g/dL (ref 30.0–36.0)
MCV: 91.6 fl (ref 78.0–100.0)
Platelets: 213 10*3/uL (ref 150.0–400.0)
RBC: 3.89 Mil/uL — AB (ref 4.22–5.81)
RDW: 14.9 % (ref 11.5–15.5)
WBC: 5.8 10*3/uL (ref 4.0–10.5)

## 2013-11-24 LAB — BASIC METABOLIC PANEL
BUN: 22 mg/dL (ref 6–23)
CALCIUM: 9.5 mg/dL (ref 8.4–10.5)
CO2: 30 meq/L (ref 19–32)
Chloride: 98 mEq/L (ref 96–112)
Creatinine, Ser: 1.5 mg/dL (ref 0.4–1.5)
GFR: 48.87 mL/min — ABNORMAL LOW (ref >60.00)
Glucose, Bld: 103 mg/dL — ABNORMAL HIGH (ref 70–99)
POTASSIUM: 3.6 meq/L (ref 3.5–5.1)
SODIUM: 138 meq/L (ref 135–145)

## 2013-11-24 NOTE — Progress Notes (Signed)
Quick Note:  Please report to patient. The recent labs are stable. Continue same medication and careful diet. ______ 

## 2013-11-24 NOTE — Progress Notes (Signed)
Pt was started on [name] 60 mg qPM with food for Afib on 10/22/13.  Reviewed patients medication list.  Pt is not currently on any combined P-gp and strong CYP3A4 inhibitors/inducers (ketoconazole, traconazole, ritonavir, carbamazepine, phenytoin, rifampin, St. John's wort).  Reviewed labs.  SCr 1.5, Weight 186 lbs, CrCl- 52 ml/min.  Dose appropriate based on CrCl.   Hgb and HCT Within Normal Limits / stable.  Patient has experienced more bruising on his arms since changing from warfarin to [name], and had dark stools x 1 last month, but this resolved the following day.  Discussed with length with patient, and he would like to continue [name] as long as the bleeding problems don't restart, and as long as his hemoglobin remains stable.  He pays out of pocket for his medications.  He hasn't had any issue getting the [name] from the pharmacy.    Will have patient continue [name] at 60 mg qd with food, but given Scr trending up over past month, will recheck BMET again in 4 weeks, and if renal function continues to decline, will consider reducing [name] to 30 mg qd.    A full discussion of the nature of anticoagulants has been carried out.  A benefit/risk analysis has been presented to the patient, so that they understand the justification for choosing anticoagulation with Xarelto at this time.  The need for compliance is stressed.  Pt is aware to take the medication once daily with the largest meal of the day.  Side effects of potential bleeding are discussed, including unusual colored urine or stools, coughing up blood or coffee ground emesis, nose bleeds or serious fall or head trauma.  Discussed signs and symptoms of stroke. The patient should avoid any OTC items containing aspirin or ibuprofen.  Avoid alcohol consumption.   Call if any signs of abnormal bleeding.  Discussed financial obligations and resolved any difficulty in obtaining medication.    Next lab test test in 4 weeks (BMET 12/22/13), and  f/u with anticoag clinic again in 6 months 05/2014)

## 2013-11-25 ENCOUNTER — Telehealth: Payer: Self-pay | Admitting: *Deleted

## 2013-11-25 NOTE — Telephone Encounter (Signed)
Message copied by Burnell Blanks on Tue Nov 25, 2013 10:01 AM ------      Message from: [name]      Created: Mon Nov 24, 2013  3:12 PM       Please report to patient.  The recent labs are stable. Continue same medication and careful diet. ------

## 2013-11-25 NOTE — Telephone Encounter (Signed)
See me in October

## 2013-11-25 NOTE — Telephone Encounter (Signed)
Patient has no follow up with  Dr. [name] scheduled but has saw [name] in April and sees him again in August. Will forward to  Dr. [name] for recommendations of next ov  Advised patient of lab results

## 2013-11-26 NOTE — Telephone Encounter (Signed)
Scheduled ov and advised patient

## 2013-12-17 ENCOUNTER — Other Ambulatory Visit: Payer: Self-pay | Admitting: Cardiology

## 2013-12-22 ENCOUNTER — Other Ambulatory Visit (INDEPENDENT_AMBULATORY_CARE_PROVIDER_SITE_OTHER): Payer: Medicare Other

## 2013-12-22 DIAGNOSIS — Z79899 Other long term (current) drug therapy: Secondary | ICD-10-CM

## 2013-12-22 DIAGNOSIS — I4891 Unspecified atrial fibrillation: Secondary | ICD-10-CM

## 2013-12-22 LAB — BASIC METABOLIC PANEL
BUN: 22 mg/dL (ref 6–23)
CO2: 30 mEq/L (ref 19–32)
Calcium: 9.6 mg/dL (ref 8.4–10.5)
Chloride: 99 mEq/L (ref 96–112)
Creatinine, Ser: 1.4 mg/dL (ref 0.4–1.5)
GFR: 52.51 mL/min — ABNORMAL LOW (ref >60.00)
GLUCOSE: 93 mg/dL (ref 70–99)
POTASSIUM: 3.8 meq/L (ref 3.5–5.1)
Sodium: 139 mEq/L (ref 135–145)

## 2013-12-22 NOTE — Progress Notes (Signed)
Quick Note:  Please report to patient. The recent labs are stable. Continue same medication and careful diet. ______ 

## 2014-01-05 ENCOUNTER — Other Ambulatory Visit: Payer: Self-pay | Admitting: Cardiology

## 2014-02-08 ENCOUNTER — Other Ambulatory Visit: Payer: Self-pay | Admitting: Cardiology

## 2014-02-16 ENCOUNTER — Ambulatory Visit (INDEPENDENT_AMBULATORY_CARE_PROVIDER_SITE_OTHER): Payer: Medicare Other | Admitting: Internal Medicine

## 2014-02-16 ENCOUNTER — Encounter: Payer: Self-pay | Admitting: Internal Medicine

## 2014-02-16 VITALS — BP 143/70 | HR 53 | Ht 71.0 in | Wt 195.2 lb

## 2014-02-16 DIAGNOSIS — I4819 Other persistent atrial fibrillation: Secondary | ICD-10-CM

## 2014-02-16 DIAGNOSIS — I4891 Unspecified atrial fibrillation: Secondary | ICD-10-CM

## 2014-02-16 DIAGNOSIS — I10 Essential (primary) hypertension: Secondary | ICD-10-CM

## 2014-02-16 DIAGNOSIS — I428 Other cardiomyopathies: Secondary | ICD-10-CM

## 2014-02-16 NOTE — Progress Notes (Signed)
PCP: [name], MD Primary Cardiologist: [name] is a 73 y.o. male who presents today for routine electrophysiology followup.  He is maintaining sinus rhythm with amiodarone.   He recently had arthroscopic knee surgery.  He has severe DJD of the hip and says that at some point he will need hip surgery.  Today, he denies symptoms of  chest pain, shortness of breath,  lower extremity edema, dizziness, presyncope, or syncope.  The patient is otherwise without complaint today.   Past Medical History  Diagnosis Date  . Persistent atrial fibrillation     s/p PVI ablation 12-03-2012 by [name]  . Peripheral edema   . Mitral regurgitation     MILD  . Atrial dilatation     mild biatrial dilitation  . Nonischemic cardiomyopathy     presumed to be tachycardia mediated  . Hypertension   . Hypercholesterolemia   . Chronic kidney disease     baseline creatinine is 1.8  . GERD (gastroesophageal reflux disease)   . H/O hiatal hernia   . Arthritis   . Tear of medial meniscus of left knee   . Bruises easily   . Thyroid disease   . Sleep apnea     USES C PAP   Past Surgical History  Procedure Laterality Date  . Lithotripsy      x 2  . Joint replacement  2005    total right hip replacement  . Hernia repair  03/2005  . Kidney stent  1987, 02/2005  . Quadrec  1999    LFT leg quad sx  . Cardioversion N/A 09/04/2012    Procedure: CARDIOVERSION;  Surgeon: [name], MD;  Location: [facility] ENDOSCOPY;  Service: Cardiovascular;  Laterality: N/A;  . Tee without cardioversion N/A 12/02/2012    Procedure: TRANSESOPHAGEAL ECHOCARDIOGRAM (TEE);  Surgeon: [name], MD;  Location: [facility] ENDOSCOPY;  Service: Cardiovascular;  Laterality: N/A;  . Atrial fibrillation ablation  12-03-2012    PVI by [name]  . Cardioversion N/A 02/11/2013    Procedure: CARDIOVERSION;  Surgeon: [name], MD;  Location: [facility] ENDOSCOPY;  Service: Cardiovascular;  Laterality: N/A;  . Knee  arthroscopy with medial menisectomy Left 11/20/2013    Procedure: LEFT KNEE ARTHROSCOPY WITH MEDIAL MENISECTOMY;  Surgeon: Jacki Cones, MD;  Location: [facility];  Service: Orthopedics;  Laterality: Left;  Microfracture of medial femoral chondyle and abrasion chondroplasty of the medial femoral chondyle  . Knee arthroscopy with drilling/microfracture Left 11/20/2013    Procedure: KNEE ARTHROSCOPY WITH DRILLING/MICROFRACTURE;  Surgeon: Jacki Cones, MD;  Location: [facility];  Service: Orthopedics;  Laterality: Left;    Current Outpatient Prescriptions  Medication Sig Dispense Refill  . amiodarone (PACERONE) 200 MG tablet Take 100 mg by mouth every morning.      . Ascorbic Acid (VITAMIN C) 500 MG tablet Take 1,500 mg by mouth daily.        . Cholecalciferol (VITAMIN D) 2000 UNITS CAPS Take 4,000 Units by mouth daily.        . Coenzyme Q10 (COQ10 PO) Take 100 mg by mouth daily.       . Edoxaban Tosylate ([name]) 60 MG TABS Take 60 mg by mouth every evening.      . enalapril (VASOTEC) 20 MG tablet Take 10 mg by mouth every evening. 1/2 TABLET DAILY      . furosemide (LASIX) 40 MG tablet Take 1 1/2 tablets by mouth daily      . guaiFENesin (MUCINEX) 600  MG 12 hr tablet Take 600 mg by mouth 2 (two) times daily as needed for congestion.      Marland Kitchen levothyroxine (SYNTHROID, LEVOTHROID) 50 MCG tablet TAKE ONE TABLET BY MOUTH ONE TIME DAILY   30 tablet  0  . MAGNESIUM CITRATE PO Take 400 mg by mouth 2 (two) times daily.      . metolazone (ZAROXOLYN) 5 MG tablet Take 5 mg by mouth daily as needed (fluid retention).       . metoprolol succinate (TOPROL-XL) 100 MG 24 hr tablet Take 50-100 mg by mouth 2 (two) times daily.      . Misc Natural Products (OSTEO BI-FLEX JOINT SHIELD PO) Take 2 tablets by mouth daily.       . Multiple Vitamin (MULTIVITAMIN) tablet Take 1 tablet by mouth daily.        . Omega-3 Fatty Acids (FISH OIL) 1000 MG CAPS Take 1 capsule by mouth 2 (two) times daily.       Marland Kitchen omeprazole  (PRILOSEC) 20 MG capsule Take 20 mg by mouth daily.      Marland Kitchen zinc gluconate 50 MG tablet Take 50 mg by mouth daily.         No current facility-administered medications for this visit.    Physical Exam: Filed Vitals:   02/16/14 0948  BP: 143/70  Pulse: 53  Height: 5\' 11"  (1.803 m)  Weight: 195 lb 3.2 oz (88.542 kg)    GEN- The patient is well appearing, alert and oriented x 3 today.   Head- normocephalic, atraumatic Eyes-  R eye with conjunctival hemorrhage Ears- hearing intact Oropharynx- clear Lungs- Clear to ausculation bilaterally, normal work of breathing Heart- sinus rhythm, no murmurs, rubs or gallops, PMI not laterally displaced GI- soft, NT, ND, + BS Extremities- no clubbing, cyanosis, or edema  ekg today reveals sinus rhythm 53 bpm, otherwise normal ekg  Assessment and Plan:  1. Persistent afib Maintaining sinus rhythm Stop amiodarone today Continue edoxaban Will ask anticoagulation clinic to follow BMET and CBC twice per year.  2. CRI Limits our antiarrhythmic options  3. HTN Stable No change required today  4. Nonischemic CM euvolemic today Repeat echo to see if his EF has improved with sinus rhythm  Return in 6 months to see Rudi Coco NP in the afib clinic Follow-up with [name] as scheduled

## 2014-02-16 NOTE — Patient Instructions (Addendum)
Your physician wants you to follow-up in: 6 months with Rudi Coco, NP in afib clinic You will receive a reminder letter in the mail two months in advance. If you don't receive a letter, please call our office to schedule the follow-up appointment.   Your physician has recommended you make the following change in your medication:  1) Stop Amiodarone   Your physician has requested that you have an echocardiogram. Echocardiography is a painless test that uses sound waves to create images of your heart. It provides your doctor with information about the size and shape of your heart and how well your heart's chambers and valves are working. This procedure takes approximately one hour. There are no restrictions for this procedure.

## 2014-02-18 ENCOUNTER — Ambulatory Visit (HOSPITAL_COMMUNITY): Payer: Medicare Other | Attending: Internal Medicine | Admitting: Cardiology

## 2014-02-18 DIAGNOSIS — I517 Cardiomegaly: Secondary | ICD-10-CM | POA: Insufficient documentation

## 2014-02-18 DIAGNOSIS — I428 Other cardiomyopathies: Secondary | ICD-10-CM | POA: Diagnosis not present

## 2014-02-18 DIAGNOSIS — I4819 Other persistent atrial fibrillation: Secondary | ICD-10-CM

## 2014-02-18 DIAGNOSIS — I059 Rheumatic mitral valve disease, unspecified: Secondary | ICD-10-CM | POA: Insufficient documentation

## 2014-02-18 DIAGNOSIS — I10 Essential (primary) hypertension: Secondary | ICD-10-CM

## 2014-02-18 DIAGNOSIS — I4891 Unspecified atrial fibrillation: Secondary | ICD-10-CM

## 2014-02-18 NOTE — Progress Notes (Signed)
Echo performed. 

## 2014-03-03 ENCOUNTER — Other Ambulatory Visit: Payer: Self-pay | Admitting: Cardiology

## 2014-03-18 ENCOUNTER — Other Ambulatory Visit: Payer: Medicare Other

## 2014-03-18 ENCOUNTER — Ambulatory Visit (INDEPENDENT_AMBULATORY_CARE_PROVIDER_SITE_OTHER): Payer: Medicare Other | Admitting: Cardiology

## 2014-03-18 VITALS — BP 114/50 | HR 68 | Ht 71.0 in | Wt 192.0 lb

## 2014-03-18 DIAGNOSIS — I10 Essential (primary) hypertension: Secondary | ICD-10-CM

## 2014-03-18 DIAGNOSIS — Z79899 Other long term (current) drug therapy: Secondary | ICD-10-CM

## 2014-03-18 DIAGNOSIS — I4891 Unspecified atrial fibrillation: Secondary | ICD-10-CM

## 2014-03-18 DIAGNOSIS — I5023 Acute on chronic systolic (congestive) heart failure: Secondary | ICD-10-CM

## 2014-03-18 DIAGNOSIS — I482 Chronic atrial fibrillation, unspecified: Secondary | ICD-10-CM

## 2014-03-18 DIAGNOSIS — I509 Heart failure, unspecified: Secondary | ICD-10-CM

## 2014-03-18 LAB — LIPID PANEL
Cholesterol: 172 mg/dL (ref 0–200)
HDL: 32.8 mg/dL — ABNORMAL LOW (ref >39.00)
LDL CALC: 100 mg/dL — AB (ref 0–99)
NonHDL: 139.2
Total CHOL/HDL Ratio: 5
Triglycerides: 196 mg/dL — ABNORMAL HIGH (ref 0.0–149.0)
VLDL: 39.2 mg/dL (ref 0.0–40.0)

## 2014-03-18 LAB — BASIC METABOLIC PANEL
BUN: 28 mg/dL — ABNORMAL HIGH (ref 6–23)
CO2: 29 mEq/L (ref 19–32)
Calcium: 9.5 mg/dL (ref 8.4–10.5)
Chloride: 103 mEq/L (ref 96–112)
Creatinine, Ser: 1.4 mg/dL (ref 0.4–1.5)
GFR: 52.91 mL/min — ABNORMAL LOW (ref >60.00)
GLUCOSE: 114 mg/dL — AB (ref 70–99)
Potassium: 3.9 mEq/L (ref 3.5–5.1)
Sodium: 138 mEq/L (ref 135–145)

## 2014-03-18 LAB — HEPATIC FUNCTION PANEL
ALT: 23 U/L (ref 0–53)
AST: 27 U/L (ref 0–37)
Albumin: 4.1 g/dL (ref 3.5–5.2)
Alkaline Phosphatase: 61 U/L (ref 39–117)
BILIRUBIN TOTAL: 0.6 mg/dL (ref 0.2–1.2)
Bilirubin, Direct: 0 mg/dL (ref 0.0–0.3)
Total Protein: 7.6 g/dL (ref 6.0–8.3)

## 2014-03-18 LAB — T4, FREE: Free T4: 1.16 ng/dL (ref 0.60–1.60)

## 2014-03-18 LAB — TSH: TSH: 4.63 u[IU]/mL — ABNORMAL HIGH (ref 0.35–4.50)

## 2014-03-18 NOTE — Patient Instructions (Addendum)
Will obtain labs today and call you with the results (lp/hfp/bmet/tsh/ft4)  Your physician recommends that you continue on your current medications as directed. Please refer to the Current Medication list given to you today.  Your physician wants you to follow-up in: 8 month ov You will receive a reminder letter in the mail two months in advance. If you don't receive a letter, please call our office to schedule the follow-up appointment.   WILL ARRANGE FOR YOU TO GET AN APPOINTMENT WITH A [place] [facility] DOCTOR

## 2014-03-18 NOTE — Assessment & Plan Note (Signed)
His blood pressure is remaining stable on current therapy.  No orthopnea or paroxysmal nocturnal dyspnea.  His weight is down 3 pounds since last visit.

## 2014-03-18 NOTE — Assessment & Plan Note (Signed)
The patient has not been experiencing any recent symptoms of heart failure.  He takes an occasional Zaroxolyn for weight gain

## 2014-03-18 NOTE — Progress Notes (Signed)
Quick Note:  Please report to patient. The recent labs are stable. Continue same medication and careful diet. Thyroid levels are stable. TSH is slightly elevated but free T4 is normal. These should improve further with time now that he is off amiodarone. Continue current medication ______

## 2014-03-18 NOTE — Assessment & Plan Note (Signed)
The patient is maintaining normal sinus rhythm.  He has been off amiodarone for one month

## 2014-03-18 NOTE — Progress Notes (Signed)
Charles [name] Date of Birth:  1941-06-01 [facility] [address] [place], [place]  [address] [phone]        Fax   [phone]   History of Present Illness: This pleasant 73 year old is seen for a scheduled followup office visit. He has a history of recurrent atrial fibrillation. 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 After cardioversion he showed significant clinical improvement in his ejection fraction on 10/24/11 while in normal sinus rhythm was 55-60%. Approximately 12 months ago the patient went back into atrial fibrillation with a rapid rate. An echocardiogram on 08/13/12 showed that his ejection fraction had dropped to 40%. He underwent a second cardioversion on 09/04/12 and converted to normal sinus rhythm. However by 09/13/12 he was back in atrial fibrillation. He was referred to Dr. [name] and on 12/03/2012 underwent a radiofrequency ablation by Dr. [name]. At the conclusion of the test he was cardioverted into normal sinus rhythm but his cardioversion lasted only 3 days before he went back into atrial fibrillation on about 12/06/2012. The patient underwent elective direct current cardioversion on 02/11/13. This was his fourth cardioversion. Unfortunately he stayed in sinus rhythm only about 10 days before he recognized that he was back in atrial fibrillation with rapid ventricular response.. The patient has a history of sleep apnea and has been using a CPAP machine for the past 2-3 weeks. His wife states that since he started using a CPAP machine his sleep was much more restful, and the patient agrees. He is tolerating the CPAP well.  The patient underwent PVI ambulation on 12/03/12 by Dr. [name] The patient saw Dr. [name] on 03/03/13. He was back in atrial fibrillation at that time. The patient was placed on amiodarone in an initial dose of 800 mg a day for a week  then 400 mg a day. He states that about 2 days after starting amiodarone he went back into normal sinus rhythm. He has remained in normal sinus rhythm. His tremor has increased somewhat since restarting amiodarone.  He has a history of mild hypothyroidism.  He had an echocardiogram on 02/18/14 which showed an ejection fraction of 55-60% and mild mitral regurgitation. He has been feeling well.  He saw Dr. [name] on 02/16/14 and his amiodarone was stopped at that point.  He has maintained a normal sinus rhythm so far. The patient has been experiencing worsening left hip pain and expects that he may have to have total hip replacement next spring.  He also is having worsening vision in his right eye secondary to cataracts and has an appointment to see his ophthalmologist Dr. Elmer Picker soon  Current Outpatient Prescriptions  Medication Sig Dispense Refill  . Ascorbic Acid (VITAMIN C) 500 MG tablet Take 1,500 mg by mouth daily.        . Cholecalciferol (VITAMIN D) 2000 UNITS CAPS Take 4,000 Units by mouth daily.        . Coenzyme Q10 (COQ10 PO) Take 100 mg by mouth daily.       . Edoxaban Tosylate ([name]) 60 MG TABS Take 60 mg by mouth every evening.      . enalapril (VASOTEC) 20 MG tablet Take 10 mg by mouth every evening. 1/2 TABLET DAILY      . furosemide (LASIX) 40 MG tablet Take 1 1/2 tablets by mouth daily      . guaiFENesin (MUCINEX) 600 MG 12 hr  tablet Take 600 mg by mouth 2 (two) times daily as needed for congestion.      Marland Kitchen levothyroxine (SYNTHROID, LEVOTHROID) 50 MCG tablet TAKE ONE TABLET BY MOUTH ONE TIME DAILY   30 tablet  0  . MAGNESIUM CITRATE PO Take 400 mg by mouth 2 (two) times daily.      . metolazone (ZAROXOLYN) 5 MG tablet Take 5 mg by mouth daily as needed (fluid retention).       . metoprolol succinate (TOPROL-XL) 100 MG 24 hr tablet Take 50-100 mg by mouth 2 (two) times daily.      . Misc Natural Products (OSTEO BI-FLEX JOINT SHIELD PO) Take 2 tablets by mouth daily.       . Multiple  Vitamin (MULTIVITAMIN) tablet Take 1 tablet by mouth daily.        . Omega-3 Fatty Acids (FISH OIL) 1000 MG CAPS Take 1 capsule by mouth 2 (two) times daily.       Marland Kitchen omeprazole (PRILOSEC) 20 MG capsule TAKE 1 CAPSULE BY MOUTH DAILY AS NEEDED FOR ACID REFLUX.  90 capsule  0  . zinc gluconate 50 MG tablet Take 50 mg by mouth daily.         No current facility-administered medications for this visit.    Allergies  Allergen Reactions  . Ceftriaxone Sodium Rash    Rocephin  . Diltiazem Hcl Rash    Cardizem  . Penicillins Rash    Patient Active Problem List   Diagnosis Date Noted  . Atrial fibrillation     Priority: High  . CHF (congestive heart failure)     Priority: High  . Hypertension     Priority: High  . Osteoarthritis of left knee 11/20/2013  . Complex tear of medial meniscus of left knee as current injury 11/20/2013  . Encounter for therapeutic drug monitoring 09/15/2013  . Complex sleep apnea syndrome 11/21/2012  . Nonischemic cardiomyopathy 11/09/2012  . Snoring 11/09/2012  . Hypothyroidism 08/26/2012  . Long term (current) use of anticoagulants 08/01/2012  . Tremor 10/24/2011  . Peripheral edema   . Swelling   . Mitral regurgitation   . Orthopnea   . Dyspnea   . Hypercholesterolemia   . Chronic renal insufficiency   . HYPERTENSION 11/07/2006  . GERD 11/07/2006  . ABDOMINAL WALL HERNIA 11/07/2006  . OSTEOARTHROSIS NOS, UNSPECIFIED SITE 11/07/2006  . ABRASION, LOWER EXTREMITY W/INFECTION 11/07/2006  . NEPHROLITHIASIS, HX OF 11/07/2006  . HIP REPLACEMENT, TOTAL, HX OF 11/07/2006    History  Smoking status  . Never Smoker   Smokeless tobacco  . Never Used    History  Alcohol Use No    Family History  Problem Relation Age of Onset  . Cancer Mother 44    CANCER  . Heart disease Father 66    HEART PROBLEMS    Review of Systems: Constitutional: no fever chills diaphoresis or fatigue or change in weight.  Head and neck: no hearing loss, no epistaxis,  no photophobia or visual disturbance. Respiratory: No cough, shortness of breath or wheezing. Cardiovascular: No chest pain peripheral edema, palpitations. Gastrointestinal: No abdominal distention, no abdominal pain, no change in bowel habits hematochezia or melena. Genitourinary: No dysuria, no frequency, no urgency, no nocturia. Musculoskeletal:No arthralgias, no back pain, no gait disturbance or myalgias. Neurological: No dizziness, no headaches, no numbness, no seizures, no syncope, no weakness, no tremors. Hematologic: No lymphadenopathy, no easy bruising. Psychiatric: No confusion, no hallucinations, no sleep disturbance.    Physical Exam: Ceasar Mons  Vitals:   03/18/14 0822  BP: 114/50  Pulse: 68  The patient appears to be in no distress.  Head and neck exam reveals that the pupils are equal and reactive.  The extraocular movements are full.  There is no scleral icterus.  Mouth and pharynx are benign.  No lymphadenopathy.  No carotid bruits.  The jugular venous pressure is normal.  Thyroid is not enlarged or tender.  Chest is clear to percussion and auscultation.  No rales or rhonchi.  Expansion of the chest is symmetrical.  Heart reveals no abnormal lift or heave.  First and second heart sounds are normal.  There is no murmur gallop rub or click.  The abdomen is soft and nontender.  Bowel sounds are normoactive.  There is no hepatosplenomegaly or mass.  There are no abdominal bruits.  Extremities reveal no phlebitis or edema.  Pedal pulses are good.  There is no cyanosis or clubbing.  Neurologic exam is normal strength and no lateralizing weakness.  No sensory deficits.  Integument reveals no rash    Assessment / Plan: 1.  Paroxysmal atrial fibrillation currently in normal sinus rhythm.  Patient is status post VVI ablation on 12/03/12 by Dr. [name]. 2. past history of tachycardia mediated cardiomyopathy now with normal left ventricular function in normal sinus rhythm 3. mild  hypothyroidism 4. chronic kidney disease 5. GERD 6. osteoarthritis of left hip followed by Dr. Darrelyn Hillock. 7. sleep apnea and uses CPAP machine  Disposition: Continue current medication.  We're checking lab work today.  Recheck for followup office visit and EKG in 8 months.

## 2014-03-23 ENCOUNTER — Telehealth: Payer: Self-pay | Admitting: Cardiology

## 2014-03-23 NOTE — Telephone Encounter (Signed)
03-18-14  When patient checked out he did not want an appt made with a primary care doctor at this time.  He wanted to pick his own doctor. ST

## 2014-03-25 ENCOUNTER — Telehealth: Payer: Self-pay | Admitting: *Deleted

## 2014-03-25 ENCOUNTER — Other Ambulatory Visit: Payer: Self-pay

## 2014-03-25 MED ORDER — ENALAPRIL MALEATE 20 MG PO TABS: 10.0000 mg | ORAL_TABLET | Freq: Every evening | ORAL | Status: DC

## 2014-03-25 MED ORDER — LEVOTHYROXINE SODIUM 50 MCG PO TABS: ORAL_TABLET | ORAL | Status: DC

## 2014-03-25 NOTE — Telephone Encounter (Signed)
Will forward to  Dr. Brackbill so he will be aware 

## 2014-03-25 NOTE — Telephone Encounter (Signed)
Noted  

## 2014-03-25 NOTE — Telephone Encounter (Signed)
Message copied by Burnell Blanks on Wed Mar 25, 2014  3:38 PM ------      Message from: Sharin Mons I      Created: Wed Mar 18, 2014  9:02 AM      Regarding: referral to primary care                   Juliette Alcide            This patient did not want to be scheduled with a primary care doctor at this time.            He was given the information to get a primary care doctor.            [place] ------

## 2014-04-29 ENCOUNTER — Other Ambulatory Visit: Payer: Self-pay

## 2014-04-29 MED ORDER — METOPROLOL SUCCINATE ER 50 MG PO TB24: 50.0000 mg | ORAL_TABLET | Freq: Two times a day (BID) | ORAL | Status: DC

## 2014-04-29 MED ORDER — ENALAPRIL MALEATE 20 MG PO TABS: 10.0000 mg | ORAL_TABLET | Freq: Every evening | ORAL | Status: DC

## 2014-04-29 MED ORDER — EDOXABAN TOSYLATE 60 MG PO TABS: 60.0000 mg | ORAL_TABLET | Freq: Every evening | ORAL | Status: DC

## 2014-06-01 ENCOUNTER — Ambulatory Visit (INDEPENDENT_AMBULATORY_CARE_PROVIDER_SITE_OTHER): Payer: Medicare Other | Admitting: *Deleted

## 2014-06-01 DIAGNOSIS — I4891 Unspecified atrial fibrillation: Secondary | ICD-10-CM

## 2014-06-01 LAB — BASIC METABOLIC PANEL
BUN: 35 mg/dL — ABNORMAL HIGH (ref 6–23)
CALCIUM: 10 mg/dL (ref 8.4–10.5)
CO2: 29 meq/L (ref 19–32)
Chloride: 99 mEq/L (ref 96–112)
Creatinine, Ser: 1.4 mg/dL (ref 0.4–1.5)
GFR: 54.69 mL/min — ABNORMAL LOW (ref >60.00)
GLUCOSE: 86 mg/dL (ref 70–99)
Potassium: 4 mEq/L (ref 3.5–5.1)
Sodium: 136 mEq/L (ref 135–145)

## 2014-06-01 LAB — CBC
HEMATOCRIT: 38.8 % — AB (ref 39.0–52.0)
Hemoglobin: 12.9 g/dL — ABNORMAL LOW (ref 13.0–17.0)
MCHC: 33.2 g/dL (ref 30.0–36.0)
MCV: 91.6 fl (ref 78.0–100.0)
Platelets: 248 10*3/uL (ref 150.0–400.0)
RBC: 4.24 Mil/uL (ref 4.22–5.81)
RDW: 13 % (ref 11.5–15.5)
WBC: 8.7 10*3/uL (ref 4.0–10.5)

## 2014-06-02 NOTE — Progress Notes (Signed)
Pt was started on [name]  for Afib  on 10/22/2013.    Reviewed patients medication list.  Pt is not  currently on any combined P-gp and strong CYP3A4 inhibitors/inducers (ketoconazole, traconazole, ritonavir, carbamazepine, phenytoin, rifampin, St. John's wort).  Reviewed labs.  SCr 1.4 Weight 195.8 lbs, CrCl- 57.74ml/min.  Dose appropriate based on CrCl at 60mg s QD in evening. Hgb and HCT 12.9/38.8.   A full discussion of the nature of anticoagulants has been carried out.  A benefit/risk analysis has been presented to the patient, so that they understand the justification for choosing anticoagulation with [name] at this time.  The need for compliance is stressed.  Pt is aware to take the medication daily.  Side effects of potential bleeding are discussed, including unusual colored urine or stools, coughing up blood or coffee ground emesis, nose bleeds or serious fall or head trauma.  Discussed signs and symptoms of stroke. The patient should avoid any OTC items containing aspirin or ibuprofen.  Avoid alcohol consumption.   Call if any signs of abnormal bleeding.  Discussed financial obligations and resolved any difficulty in obtaining medication.  Next lab test test in 6 months.

## 2014-06-02 NOTE — Progress Notes (Signed)
Quick Note:  Please report to patient. The recent labs are stable. Continue same medication and careful diet. ______ 

## 2014-06-04 ENCOUNTER — Encounter (HOSPITAL_COMMUNITY): Payer: Self-pay | Admitting: Internal Medicine

## 2014-06-08 ENCOUNTER — Telehealth: Payer: Self-pay | Admitting: Cardiology

## 2014-06-08 MED ORDER — MELOXICAM 15 MG PO TABS: 15.0000 mg | ORAL_TABLET | Freq: Every day | ORAL | Status: DC

## 2014-06-08 NOTE — Telephone Encounter (Signed)
New message ° ° ° ° °Talk to Charles [name] °

## 2014-06-08 NOTE — Telephone Encounter (Signed)
Spoke with patient and he saw his orthopedist today who was recommending Celebrex, Indocin, or Mobic  Orthopedist wanted  Dr. [name] to Rx secondary to medical history  Patient would like a generic medication Will forward to  Dr. [name] for review

## 2014-06-08 NOTE — Telephone Encounter (Signed)
Advised patient and sent Rx to [facility] Advised to be looking for any signs of bleeding, verbalized understanding

## 2014-06-08 NOTE — Telephone Encounter (Signed)
We can try Mobic generic 15 mg daily

## 2014-07-29 ENCOUNTER — Ambulatory Visit (INDEPENDENT_AMBULATORY_CARE_PROVIDER_SITE_OTHER): Payer: Medicare Other | Admitting: Nurse Practitioner

## 2014-07-29 ENCOUNTER — Encounter: Payer: Self-pay | Admitting: Nurse Practitioner

## 2014-07-29 VITALS — BP 132/66 | HR 71 | Ht 71.0 in | Wt 199.4 lb

## 2014-07-29 DIAGNOSIS — I4819 Other persistent atrial fibrillation: Secondary | ICD-10-CM

## 2014-07-29 DIAGNOSIS — I481 Persistent atrial fibrillation: Secondary | ICD-10-CM

## 2014-07-29 NOTE — Patient Instructions (Signed)
Your physician recommends that you continue on your current medications as directed. Please refer to the Current Medication list given to you today.  Keep follow up in June with Dr. [name].  Follow up with Dr. [name] or Rudi Coco, NP as needed.

## 2014-07-29 NOTE — Progress Notes (Signed)
PCP: Sharmon Leyden, MD Primary Cardiologist: Dr Cleda Mccreedy is a 74 y.o. male who presents today for routine electrophysiology followup.  He is maintaining sinus rhythm with amiodarone being discontinued in August 2015.   He denies any palpitations. Doing well with blood thinner without bruising. CBC and Met B checked last in December and were stable. Echo was repeated in August revealing normal EF.  Today, he denies symptoms of  chest pain, shortness of breath,  lower extremity edema, dizziness, presyncope, or syncope.  The patient is otherwise without complaint today.   Past Medical History  Diagnosis Date  . Persistent atrial fibrillation     s/p PVI ablation 12-03-2012 by Dr Rayann Heman  . Peripheral edema   . Mitral regurgitation     MILD  . Atrial dilatation     mild biatrial dilitation  . Nonischemic cardiomyopathy     presumed to be tachycardia mediated  . Hypertension   . Hypercholesterolemia   . Chronic kidney disease     baseline creatinine is 1.8  . GERD (gastroesophageal reflux disease)   . H/O hiatal hernia   . Arthritis   . Tear of medial meniscus of left knee   . Bruises easily   . Thyroid disease   . Sleep apnea     USES C PAP   Past Surgical History  Procedure Laterality Date  . Lithotripsy      x 2  . Joint replacement  2005    total right hip replacement  . Hernia repair  03/2005  . Kidney stent  1987, 02/2005  . Quadrec  1999    LFT leg quad sx  . Cardioversion N/A 09/04/2012    Procedure: CARDIOVERSION;  Surgeon: Darlin Coco, MD;  Location: [facility] ENDOSCOPY;  Service: Cardiovascular;  Laterality: N/A;  . Tee without cardioversion N/A 12/02/2012    Procedure: TRANSESOPHAGEAL ECHOCARDIOGRAM (TEE);  Surgeon: Fay Records, MD;  Location: [facility] ENDOSCOPY;  Service: Cardiovascular;  Laterality: N/A;  . Atrial fibrillation ablation  12-03-2012    PVI by Dr Rayann Heman  . Cardioversion N/A 02/11/2013    Procedure: CARDIOVERSION;  Surgeon: Darlin Coco, MD;  Location: [facility] ENDOSCOPY;  Service: Cardiovascular;  Laterality: N/A;  . Knee arthroscopy with medial menisectomy Left 11/20/2013    Procedure: LEFT KNEE ARTHROSCOPY WITH MEDIAL MENISECTOMY;  Surgeon: Tobi Bastos, MD;  Location: [facility] ORS;  Service: Orthopedics;  Laterality: Left;  Microfracture of medial femoral chondyle and abrasion chondroplasty of the medial femoral chondyle  . Knee arthroscopy with drilling/microfracture Left 11/20/2013    Procedure: KNEE ARTHROSCOPY WITH DRILLING/MICROFRACTURE;  Surgeon: Tobi Bastos, MD;  Location: [facility] ORS;  Service: Orthopedics;  Laterality: Left;  . Atrial fibrillation ablation N/A 12/03/2012    Procedure: ATRIAL FIBRILLATION ABLATION;  Surgeon: Thompson Grayer, MD;  Location: [facility] CATH LAB;  Service: Cardiovascular;  Laterality: N/A;    Current Outpatient Prescriptions  Medication Sig Dispense Refill  . Ascorbic Acid (VITAMIN C) 500 MG tablet Take 1,500 mg by mouth daily.      . Cholecalciferol (VITAMIN D) 2000 UNITS CAPS Take 4,000 Units by mouth daily.      . Coenzyme Q10 (COQ10 PO) Take 100 mg by mouth daily.     Marland Kitchen edoxaban ([name]) 60 MG TABS tablet Take 60 mg by mouth every evening. 90 tablet 3  . enalapril (VASOTEC) 20 MG tablet Take 0.5 tablets (10 mg total) by mouth every evening. 45 tablet 1  . furosemide (LASIX) 40 MG  tablet Take 1 1/2 tablets by mouth daily    . guaiFENesin (MUCINEX) 600 MG 12 hr tablet Take 600 mg by mouth 2 (two) times daily as needed for congestion.    Marland Kitchen levothyroxine (SYNTHROID, LEVOTHROID) 50 MCG tablet TAKE ONE TABLET BY MOUTH ONE TIME DAILY 90 tablet 1  . MAGNESIUM CITRATE PO Take 400 mg by mouth 2 (two) times daily.    . meloxicam (MOBIC) 15 MG tablet Take 1 tablet (15 mg total) by mouth daily. 30 tablet 5  . metolazone (ZAROXOLYN) 5 MG tablet Take 5 mg by mouth daily as needed (fluid retention).     . metoprolol succinate (TOPROL-XL) 100 MG 24 hr tablet Take 50-100 mg by mouth daily. Take 161m (1  tablet) in the morning and 567m(1/2 tablet) in the evening with or immediately following a meal.    . Misc Natural Products (OSTEO BI-FLEX JOINT SHIELD PO) Take 2 tablets by mouth daily.     . Multiple Vitamin (MULTIVITAMIN) tablet Take 1 tablet by mouth daily.      . Omega-3 Fatty Acids (FISH OIL) 1000 MG CAPS Take 1 capsule by mouth 2 (two) times daily.     . Marland Kitchenmeprazole (PRILOSEC) 20 MG capsule TAKE 1 CAPSULE BY MOUTH DAILY AS NEEDED FOR ACID REFLUX. 90 capsule 0  . zinc gluconate 50 MG tablet Take 50 mg by mouth daily.       No current facility-administered medications for this visit.    Physical Exam: Filed Vitals:   07/29/14 1449  BP: 132/66  Pulse: 71  Height: '5\' 11"'  (1.803 m)  Weight: 199 lb 6.4 oz (90.447 kg)  SpO2: 94%    GEN- The patient is well appearing, alert and oriented x 3 today.   Head- normocephalic, atraumatic Eyes-  R eye with conjunctival hemorrhage Ears- hearing intact Oropharynx- clear Lungs- Clear to ausculation bilaterally, normal work of breathing Heart- sinus rhythm, no murmurs, rubs or gallops, PMI not laterally displaced GI- soft, NT, ND, + BS Extremities- no clubbing, cyanosis, or edema  ekg today reveals sinus rhythm 69 bpm, otherwise normal ekg  Assessment and Plan:  1. Persistent afib Maintaining sinus rhythm s/p ablation 2014 and discontinuation of amiodarone. Continue edoxaban for chads2vasc score of at least 2. Will ask anticoagulation clinic to follow BMET and CBC twice per year.   2. HTN Stable No change required today  4.H/O LV dysfunction,resolved with maintenance of SR, with echo in August showing improvement to 55-60% from previous 40%.   Follow-up with Dr BrMare Ferraris scheduled EP as needed.

## 2014-07-30 ENCOUNTER — Other Ambulatory Visit: Payer: Self-pay

## 2014-07-30 ENCOUNTER — Telehealth: Payer: Self-pay

## 2014-07-30 MED ORDER — OMEPRAZOLE 20 MG PO CPDR: DELAYED_RELEASE_CAPSULE | ORAL | Status: DC

## 2014-07-30 NOTE — Telephone Encounter (Signed)
Discussed with  Dr. [name] and will have patient continue current Metoprolol dose Unable to reach patient/wife or to leave message Will try again tomorrow

## 2014-07-31 NOTE — Telephone Encounter (Signed)
Still unable to reach patient, unable to leave message

## 2014-08-03 NOTE — Telephone Encounter (Signed)
Verified Toprol 50 mg 1 in the morning and 1/2 tablet in the evening with patient and wife Discussed with  Dr. [name] and patient to continue as he has been taking  Rx changed in Epic

## 2014-09-16 ENCOUNTER — Other Ambulatory Visit: Payer: Self-pay | Admitting: *Deleted

## 2014-09-16 MED ORDER — LEVOTHYROXINE SODIUM 50 MCG PO TABS: ORAL_TABLET | ORAL | Status: DC

## 2014-09-22 ENCOUNTER — Telehealth: Payer: Self-pay

## 2014-09-22 NOTE — Telephone Encounter (Signed)
Patient has been using Mucinex  Recommended patient try OTC Claritin plain for allergies

## 2014-09-23 NOTE — Telephone Encounter (Signed)
Agree Claritin is a good choice.

## 2014-10-05 ENCOUNTER — Encounter: Payer: Self-pay | Admitting: Pulmonary Disease

## 2014-10-05 ENCOUNTER — Ambulatory Visit (INDEPENDENT_AMBULATORY_CARE_PROVIDER_SITE_OTHER): Payer: Medicare Other | Admitting: Pulmonary Disease

## 2014-10-05 VITALS — BP 124/74 | HR 70 | Temp 97.2°F | Ht 71.0 in | Wt 201.6 lb

## 2014-10-05 DIAGNOSIS — G4733 Obstructive sleep apnea (adult) (pediatric): Secondary | ICD-10-CM

## 2014-10-05 DIAGNOSIS — G4737 Central sleep apnea in conditions classified elsewhere: Secondary | ICD-10-CM

## 2014-10-05 DIAGNOSIS — G4731 Primary central sleep apnea: Secondary | ICD-10-CM

## 2014-10-05 NOTE — Patient Instructions (Signed)
Continue on cpap, and work on modest weight reduction Will refer to sleep center for a mask fitting session. followup in one year if doing well, but call us if having ongoing problems.

## 2014-10-05 NOTE — Progress Notes (Signed)
   Subjective:    Patient ID: Charles [name], male    DOB: 1940/12/06, 74 y.o.   MRN: [id number]  HPI The patient comes in today for follow-up of his known complex apnea. He is wearing C Pap compliantly, but continues to have ongoing issues with his mask. He has been keeping up with his cushions, but continues to have leaks. He has never had a fitting session at the sleep Center. He feels that his C Pap device continues to help his sleep, but he wishes to try and improve his mask leak issues.   Review of Systems  Constitutional: Negative for fever and unexpected weight change.  HENT: Negative for congestion, dental problem, ear pain, nosebleeds, postnasal drip, rhinorrhea, sinus pressure, sneezing, sore throat and trouble swallowing.   Eyes: Negative for redness and itching.  Respiratory: Negative for cough, chest tightness, shortness of breath and wheezing.   Cardiovascular: Negative for palpitations and leg swelling.  Gastrointestinal: Negative for nausea and vomiting.  Genitourinary: Negative for dysuria.  Musculoskeletal: Negative for joint swelling.  Skin: Negative for rash.  Neurological: Negative for headaches.  Hematological: Does not bruise/bleed easily.  Psychiatric/Behavioral: Negative for dysphoric mood. The patient is not nervous/anxious.        Objective:   Physical Exam Well-developed male in no acute distress Nose without purulence or discharge noted Neck without lymphadenopathy or thyromegaly No skin breakdown or pressure necrosis from the C Pap mask Lower extremities without significant edema, no cyanosis Alert and oriented, moves all 4 extremities.       Assessment & Plan:

## 2014-10-05 NOTE — Assessment & Plan Note (Signed)
The pt is doing well with cpap, although is having mask fitting issues.  Will refer him to the sleep center for a mask fitting session, and I have encouraged him to work on modest weight loss.  He feels his cardiac status is much more stable, and therefore the central component to his sleep apnea should be better.  However, he still has weight to lose.

## 2014-10-07 ENCOUNTER — Ambulatory Visit (HOSPITAL_BASED_OUTPATIENT_CLINIC_OR_DEPARTMENT_OTHER): Payer: Medicare Other | Attending: Pulmonary Disease | Admitting: Radiology

## 2014-10-07 DIAGNOSIS — Z9989 Dependence on other enabling machines and devices: Secondary | ICD-10-CM

## 2014-10-07 DIAGNOSIS — G4731 Primary central sleep apnea: Secondary | ICD-10-CM

## 2014-10-07 DIAGNOSIS — G4733 Obstructive sleep apnea (adult) (pediatric): Secondary | ICD-10-CM

## 2014-11-10 ENCOUNTER — Telehealth: Payer: Self-pay | Admitting: *Deleted

## 2014-11-10 MED ORDER — MELOXICAM 15 MG PO TABS: 15.0000 mg | ORAL_TABLET | Freq: Every day | ORAL | Status: DC

## 2014-11-10 NOTE — Telephone Encounter (Signed)
Patients wife requests a 90day refill of the meloxicam be sent to [facility]. Thanks, MI

## 2014-11-10 NOTE — Telephone Encounter (Signed)
Rx sent to pharmacy   

## 2014-11-10 NOTE — Telephone Encounter (Signed)
Will forward to  Dr. Brackbill for review 

## 2014-11-10 NOTE — Telephone Encounter (Signed)
OK to refill meloxicam

## 2014-11-17 ENCOUNTER — Encounter: Payer: Self-pay | Admitting: Cardiology

## 2014-11-17 ENCOUNTER — Ambulatory Visit (INDEPENDENT_AMBULATORY_CARE_PROVIDER_SITE_OTHER): Payer: Medicare Other | Admitting: Cardiology

## 2014-11-17 ENCOUNTER — Ambulatory Visit (INDEPENDENT_AMBULATORY_CARE_PROVIDER_SITE_OTHER): Payer: Medicare Other | Admitting: *Deleted

## 2014-11-17 VITALS — BP 122/60 | HR 64 | Ht 71.0 in | Wt 197.8 lb

## 2014-11-17 DIAGNOSIS — Z79899 Other long term (current) drug therapy: Secondary | ICD-10-CM | POA: Diagnosis not present

## 2014-11-17 DIAGNOSIS — I4891 Unspecified atrial fibrillation: Secondary | ICD-10-CM

## 2014-11-17 MED ORDER — FUROSEMIDE 40 MG PO TABS: 60.0000 mg | ORAL_TABLET | Freq: Every day | ORAL | Status: DC

## 2014-11-17 MED ORDER — LEVOTHYROXINE SODIUM 50 MCG PO TABS: ORAL_TABLET | ORAL | Status: DC

## 2014-11-17 MED ORDER — ENALAPRIL MALEATE 20 MG PO TABS: 10.0000 mg | ORAL_TABLET | Freq: Every evening | ORAL | Status: DC

## 2014-11-17 NOTE — Patient Instructions (Signed)
Medication Instructions:  Your physician recommends that you continue on your current medications as directed. Please refer to the Current Medication list given to you today.  Labwork:   Testing/Procedures: none  Follow-Up: Your physician wants you to follow-up in: 6 months with fasting labs (lp/bmet/hfp) EKG  You will receive a reminder letter in the mail two months in advance. If you don't receive a letter, please call our office to schedule the follow-up appointment.   Any Other Special Instructions Will Be Listed Below (If Applicable).

## 2014-11-17 NOTE — Progress Notes (Signed)
Pt was started on [name] 60mg  daily for Atrial Fib on April 29.2015  Reviewed patients medication list.  Pt is not  currently on any combined P-gp and strong CYP3A4 inhibitors/inducers (ketoconazole, traconazole, ritonavir, carbamazepine, phenytoin, rifampin, St. John's wort).  Reviewed labs.  SCr 1.54 Weight  89.9 kg CrCl 52.4666   Dose is  appropriate based on CrCl.   Hgb 11.6 and HCT 34.0  A full discussion of the nature of anticoagulants has been carried out.  A benefit/risk analysis has been presented to the patient, so that they understand the justification for choosing anticoagulation with [name] at this time.  The need for compliance is stressed.  Pt is aware to take the medication once daily.  Side effects of potential bleeding are discussed, including unusual colored urine or stools, coughing up blood or coffee ground emesis, nose bleeds or serious fall or head trauma.  Discussed signs and symptoms of stroke. The patient should avoid any OTC items containing aspirin or ibuprofen.  Avoid alcohol consumption.   Call if any signs of abnormal bleeding.  Discussed financial obligations and states not having any problems in obtaining medication.  Next lab test test in 6 months.  Pt states has had no sign or symptom of bleeding nor any sign or symptom of stroke . Denies any missed doses Only new medication is Mobic started May 17th Will obtain CBC and BMET today and will call with results and make return appt 11/18/2014 Spoke with pt and informed that he is on correct dose of [name] according to CrCl . Made an appt for him to be seen in 6 months and informed that [name] or Juliette Alcide will call with information regarding other labs  And pt states understanding

## 2014-11-17 NOTE — Progress Notes (Signed)
Cardiology Office Note   Date:  11/17/2014   ID:  [name], [name] [birth year], MRN [id number]  PCP:  Sharmon Leyden, MD  Cardiologist: Darlin Coco MD  No chief complaint on file.     History of Present Illness: Charles [name] is a 74 y.o. male who presents for a six-month follow-up visit  Charles [name] is a 74 y.o. male who presents today for routine cardiology followup. He is maintaining sinus rhythm with amiodarone being discontinued in August 2015. He denies any palpitations. Doing well with blood thinner without bruising. CBC and Met B checked last in December and were stable. Echo was repeated in August 2015 revealing normal EF.  Today, he denies symptoms of chest pain, shortness of breath, lower extremity edema, dizziness, presyncope, or syncope. Patient notes occasional palpitations which are very brief. Patient has a lot of back pain.  This also affects his left knee.  He had knee surgery in March 2015.  He has to walk with a cane.  His orthopedist tells him that his knee problem is because of his back.  He tried doing without the meloxicam but was unable to stay off it.  Tylenol was ineffective.  Past Medical History  Diagnosis Date  . Persistent atrial fibrillation     s/p PVI ablation 12-03-2012 by Dr Rayann Heman  . Peripheral edema   . Mitral regurgitation     MILD  . Atrial dilatation     mild biatrial dilitation  . Nonischemic cardiomyopathy     presumed to be tachycardia mediated  . Hypertension   . Hypercholesterolemia   . Chronic kidney disease     baseline creatinine is 1.8  . GERD (gastroesophageal reflux disease)   . H/O hiatal hernia   . Arthritis   . Tear of medial meniscus of left knee   . Bruises easily   . Thyroid disease   . Sleep apnea     USES C PAP    Past Surgical History  Procedure Laterality Date  . Lithotripsy      x 2  . Joint replacement  2005    total right hip replacement  . Hernia repair  03/2005  . Kidney  stent  1987, 02/2005  . Quadrec  1999    LFT leg quad sx  . Cardioversion N/A 09/04/2012    Procedure: CARDIOVERSION;  Surgeon: Darlin Coco, MD;  Location: [facility] ENDOSCOPY;  Service: Cardiovascular;  Laterality: N/A;  . Tee without cardioversion N/A 12/02/2012    Procedure: TRANSESOPHAGEAL ECHOCARDIOGRAM (TEE);  Surgeon: Fay Records, MD;  Location: [facility] ENDOSCOPY;  Service: Cardiovascular;  Laterality: N/A;  . Atrial fibrillation ablation  12-03-2012    PVI by Dr Rayann Heman  . Cardioversion N/A 02/11/2013    Procedure: CARDIOVERSION;  Surgeon: Darlin Coco, MD;  Location: [facility] ENDOSCOPY;  Service: Cardiovascular;  Laterality: N/A;  . Knee arthroscopy with medial menisectomy Left 11/20/2013    Procedure: LEFT KNEE ARTHROSCOPY WITH MEDIAL MENISECTOMY;  Surgeon: Tobi Bastos, MD;  Location: [facility] ORS;  Service: Orthopedics;  Laterality: Left;  Microfracture of medial femoral chondyle and abrasion chondroplasty of the medial femoral chondyle  . Knee arthroscopy with drilling/microfracture Left 11/20/2013    Procedure: KNEE ARTHROSCOPY WITH DRILLING/MICROFRACTURE;  Surgeon: Tobi Bastos, MD;  Location: [facility] ORS;  Service: Orthopedics;  Laterality: Left;  . Atrial fibrillation ablation N/A 12/03/2012    Procedure: ATRIAL FIBRILLATION ABLATION;  Surgeon: Thompson Grayer, MD;  Location: [facility] CATH LAB;  Service: Cardiovascular;  Laterality:  N/A;     Current Outpatient Prescriptions  Medication Sig Dispense Refill  . Ascorbic Acid (VITAMIN C) 500 MG tablet Take 1,500 mg by mouth daily.      . Cholecalciferol (VITAMIN D) 2000 UNITS CAPS Take 4,000 Units by mouth daily.      . Coenzyme Q10 (COQ10 PO) Take 100 mg by mouth daily.     Marland Kitchen edoxaban ([name]) 60 MG TABS tablet Take 60 mg by mouth every evening. 90 tablet 3  . enalapril (VASOTEC) 20 MG tablet Take 0.5 tablets (10 mg total) by mouth every evening. 45 tablet 3  . furosemide (LASIX) 40 MG tablet Take 1.5 tablets (60 mg total) by mouth daily. 135 tablet 3    . guaiFENesin (MUCINEX) 600 MG 12 hr tablet Take 600 mg by mouth 2 (two) times daily as needed for congestion.    Marland Kitchen levothyroxine (SYNTHROID, LEVOTHROID) 50 MCG tablet TAKE ONE TABLET BY MOUTH ONE TIME DAILY 90 tablet 3  . MAGNESIUM CITRATE PO Take 400 mg by mouth 2 (two) times daily.    . meloxicam (MOBIC) 15 MG tablet Take 1 tablet (15 mg total) by mouth daily. 90 tablet 3  . metolazone (ZAROXOLYN) 5 MG tablet Take 5 mg by mouth daily as needed (fluid retention).     . metoprolol succinate (TOPROL-XL) 50 MG 24 hr tablet Take 50 mg by mouth as directed. Take 1 tablet in the morning and 1/2 tablet in the evening    . Misc Natural Products (OSTEO BI-FLEX JOINT SHIELD PO) Take 2 tablets by mouth daily.     . Multiple Vitamin (MULTIVITAMIN) tablet Take 1 tablet by mouth daily.      . Omega-3 Fatty Acids (FISH OIL) 1000 MG CAPS Take 1 capsule by mouth 2 (two) times daily.     Marland Kitchen omeprazole (PRILOSEC) 20 MG capsule Take 20 mg by mouth daily.    Marland Kitchen zinc gluconate 50 MG tablet Take 50 mg by mouth daily.       No current facility-administered medications for this visit.    Allergies:   Ceftriaxone sodium; Diltiazem hcl; and Penicillins    Social History:  The patient  reports that he has never smoked. He has never used smokeless tobacco. He reports that he does not drink alcohol or use illicit drugs.   Family History:  The patient's family history includes Cancer (age of onset: 29) in his mother; Heart disease (age of onset: 69) in his father; Lupus in his brother.    ROS:  Please see the history of present illness.   Otherwise, review of systems are positive for none.   All other systems are reviewed and negative.    PHYSICAL EXAM: VS:  BP 122/60 mmHg  Pulse 64  Ht '5\' 11"'  (1.803 m)  Wt 197 lb 12.8 oz (89.721 kg)  BMI 27.60 kg/m2 , BMI Body mass index is 27.6 kg/(m^2). GEN: Well nourished, well developed, in no acute distress HEENT: normal Neck: no JVD, carotid bruits, or masses Cardiac:  RRR; no murmurs, rubs, or gallops,no edema  Respiratory:  clear to auscultation bilaterally, normal work of breathing GI: soft, nontender, nondistended, + BS MS: no deformity or atrophy Skin: warm and dry, no rash Neuro:  Strength and sensation are intact Psych: euthymic mood, full affect   EKG:  EKG is not ordered today.    Recent Labs: 03/18/2014: ALT 23; TSH 4.63* 06/01/2014: BUN 35*; Creatinine 1.4; Hemoglobin 12.9*; Platelets 248.0; Potassium 4.0; Sodium 136    Lipid  Panel    Component Value Date/Time   CHOL 172 03/18/2014 0853   TRIG 196.0* 03/18/2014 0853   HDL 32.80* 03/18/2014 0853   CHOLHDL 5 03/18/2014 0853   VLDL 39.2 03/18/2014 0853   LDLCALC 100* 03/18/2014 0853   LDLDIRECT 95.3 10/24/2011 1022      Wt Readings from Last 3 Encounters:  11/17/14 197 lb 12.8 oz (89.721 kg)  10/05/14 201 lb 9.6 oz (91.445 kg)  07/29/14 199 lb 6.4 oz (90.447 kg)         ASSESSMENT AND PLAN:  1.  History of atrial fibrillation. Maintaining sinus rhythm s/p ablation 2014 and discontinuation of amiodarone. Continue edoxaban for chads2vasc score of at least 2.    2. HTN Stable No change required today  3.  Osteoarthritis.  Meloxicam is helping  4.H/O LV dysfunction,resolved with maintenance of SR, with echo in August showing improvement to 55-60% from previous 40%.  5.  History of hypothyroidism, on thyroid replacement.  Clinically euthyroid   Current medicines are reviewed at length with the patient today.  The patient does not have concerns regarding medicines.  The following changes have been made:  no change  Labs/ tests ordered today include:   Orders Placed This Encounter  Procedures  . Lipid panel  . Hepatic function panel  . Basic metabolic panel  . CBC with Differential/Platelet  . TSH  . T4, free  . Lipid panel  . Hepatic function panel  . Basic metabolic panel     Disposition.  The patient is not fasting today.  We will have him return  soon for fasting lipid panel hepatic function panel basal metabolic panel TSH free T4 and CBC. Recheck in 6 months for office visit EKG lipid panel hepatic function panel and basal metabolic panel.  Continue current medication.  Berna Spare MD 11/17/2014 11:33 AM    [address], [place], [place]  [address] Phone: [phone]; Fax: [phone]

## 2014-11-18 ENCOUNTER — Other Ambulatory Visit (INDEPENDENT_AMBULATORY_CARE_PROVIDER_SITE_OTHER): Payer: Medicare Other | Admitting: *Deleted

## 2014-11-18 DIAGNOSIS — Z79899 Other long term (current) drug therapy: Secondary | ICD-10-CM

## 2014-11-18 LAB — CBC WITH DIFFERENTIAL/PLATELET
Basophils Absolute: 0 10*3/uL (ref 0.0–0.1)
Basophils Relative: 0.6 % (ref 0.0–3.0)
EOS ABS: 0.2 10*3/uL (ref 0.0–0.7)
Eosinophils Relative: 3.1 % (ref 0.0–5.0)
HEMATOCRIT: 34 % — AB (ref 39.0–52.0)
Hemoglobin: 11.6 g/dL — ABNORMAL LOW (ref 13.0–17.0)
LYMPHS PCT: 33 % (ref 12.0–46.0)
Lymphs Abs: 1.9 10*3/uL (ref 0.7–4.0)
MCHC: 34 g/dL (ref 30.0–36.0)
MCV: 90.8 fl (ref 78.0–100.0)
Monocytes Absolute: 0.6 10*3/uL (ref 0.1–1.0)
Monocytes Relative: 11.1 % (ref 3.0–12.0)
NEUTROS ABS: 3 10*3/uL (ref 1.4–7.7)
NEUTROS PCT: 52.2 % (ref 43.0–77.0)
Platelets: 206 10*3/uL (ref 150.0–400.0)
RBC: 3.75 Mil/uL — ABNORMAL LOW (ref 4.22–5.81)
RDW: 13.3 % (ref 11.5–15.5)
WBC: 5.7 10*3/uL (ref 4.0–10.5)

## 2014-11-18 LAB — HEPATIC FUNCTION PANEL
ALT: 17 U/L (ref 0–53)
AST: 23 U/L (ref 0–37)
Albumin: 4.5 g/dL (ref 3.5–5.2)
Alkaline Phosphatase: 70 U/L (ref 39–117)
BILIRUBIN DIRECT: 0.1 mg/dL (ref 0.0–0.3)
BILIRUBIN TOTAL: 0.6 mg/dL (ref 0.2–1.2)
TOTAL PROTEIN: 7.4 g/dL (ref 6.0–8.3)

## 2014-11-18 LAB — BASIC METABOLIC PANEL
BUN: 33 mg/dL — ABNORMAL HIGH (ref 6–23)
CALCIUM: 9.7 mg/dL (ref 8.4–10.5)
CO2: 28 mEq/L (ref 19–32)
Chloride: 100 mEq/L (ref 96–112)
Creatinine, Ser: 1.54 mg/dL — ABNORMAL HIGH (ref 0.40–1.50)
GFR: 46.92 mL/min — AB (ref >60.00)
GLUCOSE: 112 mg/dL — AB (ref 70–99)
POTASSIUM: 4.1 meq/L (ref 3.5–5.1)
SODIUM: 135 meq/L (ref 135–145)

## 2014-11-18 LAB — TSH: TSH: 2.17 u[IU]/mL (ref 0.35–4.50)

## 2014-11-18 LAB — LIPID PANEL
Cholesterol: 165 mg/dL (ref 0–200)
HDL: 39.3 mg/dL (ref >39.00)
LDL CALC: 88 mg/dL (ref 0–99)
NonHDL: 125.7
TRIGLYCERIDES: 190 mg/dL — AB (ref 0.0–149.0)
Total CHOL/HDL Ratio: 4
VLDL: 38 mg/dL (ref 0.0–40.0)

## 2014-11-18 LAB — T4, FREE: Free T4: 1.2 ng/dL (ref 0.60–1.60)

## 2014-11-18 NOTE — Progress Notes (Signed)
Quick Note:  Please report to patient. The recent labs are stable. Continue same medication and careful diet. Thyroid okay. Liver okay. There is mild anemia. Lipids stable ______

## 2014-12-22 ENCOUNTER — Encounter: Payer: Self-pay | Admitting: *Deleted

## 2014-12-22 ENCOUNTER — Encounter: Payer: Self-pay | Admitting: Physician Assistant

## 2014-12-22 ENCOUNTER — Telehealth: Payer: Self-pay | Admitting: Cardiology

## 2014-12-22 ENCOUNTER — Ambulatory Visit (INDEPENDENT_AMBULATORY_CARE_PROVIDER_SITE_OTHER): Payer: Medicare Other | Admitting: Physician Assistant

## 2014-12-22 VITALS — BP 120/68 | HR 140 | Temp 97.9°F | Ht 71.0 in | Wt 200.8 lb

## 2014-12-22 DIAGNOSIS — N183 Chronic kidney disease, stage 3 unspecified: Secondary | ICD-10-CM | POA: Insufficient documentation

## 2014-12-22 DIAGNOSIS — E039 Hypothyroidism, unspecified: Secondary | ICD-10-CM | POA: Diagnosis not present

## 2014-12-22 DIAGNOSIS — I480 Paroxysmal atrial fibrillation: Secondary | ICD-10-CM | POA: Diagnosis not present

## 2014-12-22 DIAGNOSIS — I428 Other cardiomyopathies: Secondary | ICD-10-CM

## 2014-12-22 DIAGNOSIS — I5032 Chronic diastolic (congestive) heart failure: Secondary | ICD-10-CM

## 2014-12-22 DIAGNOSIS — Z79899 Other long term (current) drug therapy: Secondary | ICD-10-CM

## 2014-12-22 DIAGNOSIS — I10 Essential (primary) hypertension: Secondary | ICD-10-CM

## 2014-12-22 DIAGNOSIS — I429 Cardiomyopathy, unspecified: Secondary | ICD-10-CM

## 2014-12-22 DIAGNOSIS — R739 Hyperglycemia, unspecified: Secondary | ICD-10-CM | POA: Insufficient documentation

## 2014-12-22 DIAGNOSIS — G4733 Obstructive sleep apnea (adult) (pediatric): Secondary | ICD-10-CM

## 2014-12-22 LAB — MAGNESIUM: MAGNESIUM: 2.6 mg/dL — AB (ref 1.5–2.5)

## 2014-12-22 LAB — CBC
HCT: 36.5 % — ABNORMAL LOW (ref 39.0–52.0)
HEMOGLOBIN: 12.3 g/dL — AB (ref 13.0–17.0)
MCHC: 33.6 g/dL (ref 30.0–36.0)
MCV: 92.1 fl (ref 78.0–100.0)
PLATELETS: 192 10*3/uL (ref 150.0–400.0)
RBC: 3.97 Mil/uL — ABNORMAL LOW (ref 4.22–5.81)
RDW: 13.2 % (ref 11.5–15.5)
WBC: 8.8 10*3/uL (ref 4.0–10.5)

## 2014-12-22 LAB — BASIC METABOLIC PANEL
BUN: 48 mg/dL — AB (ref 6–23)
CHLORIDE: 103 meq/L (ref 96–112)
CO2: 26 meq/L (ref 19–32)
CREATININE: 1.83 mg/dL — AB (ref 0.40–1.50)
Calcium: 9.9 mg/dL (ref 8.4–10.5)
GFR: 38.44 mL/min — ABNORMAL LOW (ref >60.00)
GLUCOSE: 112 mg/dL — AB (ref 70–99)
POTASSIUM: 4.7 meq/L (ref 3.5–5.1)
SODIUM: 138 meq/L (ref 135–145)

## 2014-12-22 LAB — TSH: TSH: 1.87 u[IU]/mL (ref 0.35–4.50)

## 2014-12-22 LAB — HEPATIC FUNCTION PANEL
ALBUMIN: 4.5 g/dL (ref 3.5–5.2)
ALT: 28 U/L (ref 0–53)
AST: 30 U/L (ref 0–37)
Alkaline Phosphatase: 63 U/L (ref 39–117)
BILIRUBIN TOTAL: 0.5 mg/dL (ref 0.2–1.2)
Bilirubin, Direct: 0 mg/dL (ref 0.0–0.3)
Total Protein: 7.5 g/dL (ref 6.0–8.3)

## 2014-12-22 MED ORDER — AMIODARONE HCL 200 MG PO TABS: 200.0000 mg | ORAL_TABLET | Freq: Two times a day (BID) | ORAL | Status: DC

## 2014-12-22 NOTE — Telephone Encounter (Signed)
New Message  Pt called states that he is back in AFIB request a call back to discuss

## 2014-12-22 NOTE — Patient Instructions (Addendum)
Medication Instructions:  1) START taking Amiodarone 200mg  twice daily  Labwork: BMET, CBC, TSH, Magnesium and LFTs today  Testing/Procedures: Your physician has recommended that you have a Cardioversion (DCCV). Electrical Cardioversion uses a jolt of electricity to your heart either through paddles or wired patches attached to your chest. This is a controlled, usually prescheduled, procedure. Defibrillation is done under light anesthesia in the hospital, and you usually go home the day of the procedure. This is done to get your heart back into a normal rhythm. You are not awake for the procedure. Please see the instruction sheet given to you today.  A chest x-ray takes a picture of the organs and structures inside the chest, including the heart, lungs, and blood vessels. This test can show several things, including, whether the heart is enlarges; whether fluid is building up in the lungs; and whether pacemaker / defibrillator leads are still in place.   Follow-Up:  Your physician recommends that you schedule a follow-up appointment in: 4 weeks with Dr. [name] on January 19, 2015 at 2 p.m.    Any Other Special Instructions Will Be Listed Below (If Applicable).

## 2014-12-22 NOTE — Progress Notes (Addendum)
Cardiology Office Note Date:  12/22/2014  Patient ID:  Charles [name], Charles [name] 09-04-1940, [name] PCP:  [name], MD  Cardiologist:  [name] Electrophysiologist:  Allred   Chief Complaint: recurrent atrial fib  History of Present Illness: Charles [name] is a 74 y.o. male with history of paroxysmal atrial fibrillation (several prior DCCV, s/p PVI ablation 11/2012 by Dr. [name]), peripheral edema, presumed NICM with fluctuating EF (presumed tachy-mediated), HTN, HLD, CKD stage III, GERD, thyroid disease, OSA, chronic back pain who presents as an add-on for recurrent atrial fibrillation.   It looks as though his EF has varied over the years. It was down to 35-40% in 2012. An ischemic workup was not done, but Dr. [name] planned to proceed if EF was still down by repeat echo in 2013 - this had shown normalization of LV function. Cardiomyopathy was felt tachy-mediated. EF in 07/2012 was back to 40%, and then last echo 01/2014 following maintenance of NSR showed EF 55-60%, grade 2 DD, high ventricular filling pressure, mild MR, mod dilated LA, PASP . He has required intermittent metolazone due to fluid retention. Amiodarone was stopped due to tremor in 09/2011 but the tremor did not improve. Flecanide could not be used due to varying LV function. He was changed from Pradaxa to Coumadin in 07/2012 due to cost. He underwent mapping and ablation of atrial fib in 11/2012 by Dr. [name]. He had early recurrence of atrial fib in 12/2012 requiring repeat DCCV. Dr. [name] restarted amiodarone 02/2013 to try and achieve and maintain NSR to allow the heart to remodel. In 09/2013 amiodarone was decreased and he was changed to [name] (edoxaban). Amiodarone was stopped in 01/2014 due to maintenance of NSR. He saw Dr. [name] in 10/2014 and was doing well. Last labs 11/18/14 showed normal thyroid function, Cr 1.54, glucose 112, LDL 88, trig 190. He is chronically on Mobic because nothing else has worked.     He comes back in today for recurrence of atrial fib. He reports he's been free of atrial fib for the last 2 years. On Wednesday evening he noticed his HR was higher than usual in the 80s and his heart monitor indicated it was irregular. (HR is usually 60s when in sinus per his report.) He continued to track it over the next few days and it ranged 90s-140, all with the "irregular heartbeat" indicator. He did not try to take any extra medication. He tracks his HR and blood pressure daily because he says he can't usually tell when he goes into atrial fib except with the monitor. He does tend to feel fatigued after several days of being in it, along with mild DOE with climbing stairs. He hasn't had any recent chest pain, orthopneam or PND. He hasn't had any LEE like he usually does when he retains fluid. He does endorse a 3-4 lb weight gain in the last week with some sensation of belly fullness. He has not had to take metolazone for a month. No bleeding. He reports full compliance with [name] without interruption. He also reports full compliance with CPAP including with naps. He's had no changes in medications or lifestyle recently except he did do some cleaning around bleach last week but in a well-ventilated room.   Past Medical History  Diagnosis Date  . Paroxysmal atrial fibrillation     a. H/o difficult to control. b. s/p PVI ablation 12-03-2012 by [name] with recurrence afterwards, requiring period of amiodarone.  . Peripheral edema   . Mitral  regurgitation     a. Mild by echo in 01/2014.  Marland Kitchen Atrial dilatation     mild biatrial dilitation  . Nonischemic cardiomyopathy     a. presumed to be tachycardia mediated. Varying EFs over the years from 35-40% to normal and back.  . Hypertension   . Hypercholesterolemia   . CKD (chronic kidney disease), stage III   . GERD (gastroesophageal reflux disease)   . H/O hiatal hernia   . Arthritis   . Tear of medial meniscus of left knee   . Bruises easily    . Hypothyroidism   . Sleep apnea     USES C PAP  . Hyperglycemia   . Tremor     Past Surgical History  Procedure Laterality Date  . Lithotripsy      x 2  . Joint replacement  2005    total right hip replacement  . Hernia repair  03/2005  . Kidney stent  1987, 02/2005  . Quadrec  1999    LFT leg quad sx  . Cardioversion N/A 09/04/2012    Procedure: CARDIOVERSION;  Surgeon: [name], MD;  Location: [facility] ENDOSCOPY;  Service: Cardiovascular;  Laterality: N/A;  . Tee without cardioversion N/A 12/02/2012    Procedure: TRANSESOPHAGEAL ECHOCARDIOGRAM (TEE);  Surgeon: [name], MD;  Location: [facility] ENDOSCOPY;  Service: Cardiovascular;  Laterality: N/A;  . Atrial fibrillation ablation  12-03-2012    PVI by [name]  . Cardioversion N/A 02/11/2013    Procedure: CARDIOVERSION;  Surgeon: [name], MD;  Location: [facility] ENDOSCOPY;  Service: Cardiovascular;  Laterality: N/A;  . Knee arthroscopy with medial menisectomy Left 11/20/2013    Procedure: LEFT KNEE ARTHROSCOPY WITH MEDIAL MENISECTOMY;  Surgeon: Jacki Cones, MD;  Location: [facility] ORS;  Service: Orthopedics;  Laterality: Left;  Microfracture of medial femoral chondyle and abrasion chondroplasty of the medial femoral chondyle  . Knee arthroscopy with drilling/microfracture Left 11/20/2013    Procedure: KNEE ARTHROSCOPY WITH DRILLING/MICROFRACTURE;  Surgeon: Jacki Cones, MD;  Location: [facility] ORS;  Service: Orthopedics;  Laterality: Left;  . Atrial fibrillation ablation N/A 12/03/2012    Procedure: ATRIAL FIBRILLATION ABLATION;  Surgeon: Hillis Range, MD;  Location: [facility] CATH LAB;  Service: Cardiovascular;  Laterality: N/A;    Current Outpatient Prescriptions  Medication Sig Dispense Refill  . Ascorbic Acid (VITAMIN C) 500 MG tablet Take 1,500 mg by mouth daily.      . Cholecalciferol (VITAMIN D) 2000 UNITS CAPS Take 4,000 Units by mouth daily.      . Coenzyme Q10 (COQ10 PO) Take 100 mg by mouth daily.     Marland Kitchen edoxaban ([name]) 60 MG  TABS tablet Take 60 mg by mouth every evening. 90 tablet 3  . enalapril (VASOTEC) 20 MG tablet Take 0.5 tablets (10 mg total) by mouth every evening. 45 tablet 3  . furosemide (LASIX) 40 MG tablet Take 1.5 tablets (60 mg total) by mouth daily. 135 tablet 3  . guaiFENesin (MUCINEX) 600 MG 12 hr tablet Take 600 mg by mouth 2 (two) times daily as needed for congestion.    Marland Kitchen levothyroxine (SYNTHROID, LEVOTHROID) 50 MCG tablet TAKE ONE TABLET BY MOUTH ONE TIME DAILY 90 tablet 3  . MAGNESIUM CITRATE PO Take 400 mg by mouth 2 (two) times daily.    . meloxicam (MOBIC) 15 MG tablet Take 1 tablet (15 mg total) by mouth daily. 90 tablet 3  . metolazone (ZAROXOLYN) 5 MG tablet Take 5 mg by mouth daily as needed (fluid retention).     Marland Kitchen  metoprolol succinate (TOPROL-XL) 50 MG 24 hr tablet Take 50 mg by mouth as directed. Take 1 tablet in the morning and 1/2 tablet in the evening    . Misc Natural Products (OSTEO BI-FLEX JOINT SHIELD PO) Take 2 tablets by mouth daily.     . Multiple Vitamin (MULTIVITAMIN) tablet Take 1 tablet by mouth daily.      . Omega-3 Fatty Acids (FISH OIL) 1000 MG CAPS Take 1 capsule by mouth 2 (two) times daily.     Marland Kitchen omeprazole (PRILOSEC) 20 MG capsule Take 20 mg by mouth daily.    Marland Kitchen zinc gluconate 50 MG tablet Take 50 mg by mouth daily.       No current facility-administered medications for this visit.    Allergies:   Ceftriaxone sodium; Diltiazem hcl; and Penicillins   Social History:  The patient  reports that he has never smoked. He has never used smokeless tobacco. He reports that he does not drink alcohol or use illicit drugs.   Family History:  The patient's family history includes Cancer (age of onset: 83) in his mother; Heart disease (age of onset: 31) in his father; Lupus in his brother.  ROS:  Please see the history of present illness. Walks with a cane but reports steady gait and no falls. Is chronically on Mobic because nothing else has worked. All other systems are  reviewed and otherwise negative.   PHYSICAL EXAM:  VS:  BP 120/68 mmHg  Pulse 140  Temp(Src) 97.9 F (36.6 C)  Ht 5\' 11"  (1.803 m)  Wt 200 lb 12.8 oz (91.082 kg)  BMI 28.02 kg/m2  SpO2 97% BMI: Body mass index is 28.02 kg/(m^2). Well nourished, well developed WM in no acute distress HEENT: normocephalic, atraumatic Neck: no JVD, carotid bruits or masses Cardiac:  Irregular, tachycardic S1, S2; no murmurs, rubs, or gallops Lungs:  clear to auscultation bilaterally, no wheezing, rhonchi or rales, comfortable appearing Abd: soft, nontender, no hepatomegaly, + BS, not distended MS: no deformity or atrophy Ext: no edema Skin: warm and dry, no rash Neuro:  moves all extremities spontaneously, no focal abnormalities noted, follows commands Psych: euthymic mood, full affect  EKG:  Done today shows atrial fib 140bpm, nonspecific T wave changes.  Recent Labs: 11/18/2014: ALT 17; BUN 33*; Creatinine, Ser 1.54*; Hemoglobin 11.6*; Platelets 206.0; Potassium 4.1; Sodium 135; TSH 2.17  11/18/2014: Cholesterol 165; HDL 39.30; LDL Cholesterol 88; Total CHOL/HDL Ratio 4; Triglycerides 190.0*; VLDL 38.0   CrCl cannot be calculated (Patient has no serum creatinine result on file.).   Wt Readings from Last 3 Encounters:  12/22/14 200 lb 12.8 oz (91.082 kg)  11/17/14 197 lb 12.8 oz (89.721 kg)  10/05/14 201 lb 9.6 oz (91.445 kg)    Other studies reviewed: Additional studies/records reviewed today include: summarized above  ASSESSMENT AND PLAN:  1. Recurrence of paroxysmal atrial fibrillation with RVR - long history of atrial fib, traditionally refractory to rate control. S/p ablation in 2014 with h/o recurrence, previously well controlled on amiodarone. His sinus rate is usually in the 60s. He is on metoprolol. He cannot take diltiazem due to rash allergy. Discussed with Dr. [name]. Per our discussion, will restart amiodarone at 200mg  BID and plan repeat DCCV in 2 weeks. Will also baseline labs  today to make sure no significant changes. Will update thyroid function, LFTs, and CXR given amiodarone re-initiation. If the decision is made to continue this long-term, will need PFTs and yearly eye exams as well which can be  arranged at his follow-up appointment. Dr. [name] wants him to take his metoprolol the day of DCCV. Patient agreeable to plan. He will call us if there is any deterioration in his clinical status. 2. History of edema, likely chronic diastolic CHF, with history of NICM and improved EF in 01/2015 - suspect mild volume overload due to #1. Will check labs today and if his K is not deranged will ask him to take a metolazone tomorrow. He uses this sparingly with good results. 3. Essential HTN - controlled. 4. CKD stage III - recheck Cr today to make sure no obvious electrolyte abnormalities. 5. Hypothyroidism - recheck thyroid function today.  Disposition: F/u with Dr. [name] in 4 weeks.   Current medicines are reviewed at length with the patient today.  The patient did not have any concerns regarding medicines.  Thomasene Mohair PA-C 12/22/2014 2:41 PM     [facility] [address] [place] [address] [phone] (office)  [phone] (fax)

## 2014-12-22 NOTE — Telephone Encounter (Signed)
Spoke with patient and he thinks he has been back in Afib since Thursday  Discussed with  Dr. [name] and will have him seen today Patient scheduled to see Dayna D PA today, patient aware

## 2014-12-23 ENCOUNTER — Ambulatory Visit (INDEPENDENT_AMBULATORY_CARE_PROVIDER_SITE_OTHER)
Admission: RE | Admit: 2014-12-23 | Discharge: 2014-12-23 | Disposition: A | Discharge status: Home / Self Care | Payer: Medicare Other | Source: Ambulatory Visit | Attending: Physician Assistant | Admitting: Physician Assistant

## 2014-12-23 ENCOUNTER — Other Ambulatory Visit: Payer: Self-pay

## 2014-12-23 ENCOUNTER — Encounter: Payer: Self-pay | Admitting: Physician Assistant

## 2014-12-23 DIAGNOSIS — R899 Unspecified abnormal finding in specimens from other organs, systems and tissues: Secondary | ICD-10-CM

## 2014-12-23 DIAGNOSIS — I480 Paroxysmal atrial fibrillation: Secondary | ICD-10-CM

## 2014-12-23 DIAGNOSIS — Z79899 Other long term (current) drug therapy: Secondary | ICD-10-CM

## 2014-12-23 MED ORDER — MELOXICAM 15 MG PO TABS: 15.0000 mg | ORAL_TABLET | Freq: Every day | ORAL | Status: DC

## 2014-12-25 ENCOUNTER — Telehealth: Payer: Self-pay | Admitting: Cardiology

## 2014-12-25 ENCOUNTER — Other Ambulatory Visit: Payer: Self-pay

## 2014-12-25 DIAGNOSIS — I4891 Unspecified atrial fibrillation: Secondary | ICD-10-CM

## 2014-12-25 DIAGNOSIS — R9389 Abnormal findings on diagnostic imaging of other specified body structures: Secondary | ICD-10-CM

## 2014-12-25 NOTE — Telephone Encounter (Signed)
Spoke with Mr. Charles [name] to schedule the PFT.  Per patient didn't want to do the test.  He stated that he was going to call back at a later time to discuss this with Dr. [name].

## 2014-12-25 NOTE — Telephone Encounter (Signed)
Will forward to  Dr. Brackbill for review 

## 2014-12-27 NOTE — Telephone Encounter (Signed)
Will await his call back.

## 2014-12-29 ENCOUNTER — Encounter: Payer: Self-pay | Admitting: Cardiology

## 2014-12-29 ENCOUNTER — Other Ambulatory Visit (INDEPENDENT_AMBULATORY_CARE_PROVIDER_SITE_OTHER): Payer: Medicare Other | Admitting: *Deleted

## 2014-12-29 DIAGNOSIS — R899 Unspecified abnormal finding in specimens from other organs, systems and tissues: Secondary | ICD-10-CM

## 2014-12-29 LAB — BASIC METABOLIC PANEL
BUN: 42 mg/dL — AB (ref 6–23)
CALCIUM: 9.7 mg/dL (ref 8.4–10.5)
CO2: 24 mEq/L (ref 19–32)
Chloride: 102 mEq/L (ref 96–112)
Creatinine, Ser: 1.82 mg/dL — ABNORMAL HIGH (ref 0.40–1.50)
GFR: 38.68 mL/min — AB (ref >60.00)
Glucose, Bld: 85 mg/dL (ref 70–99)
POTASSIUM: 3.8 meq/L (ref 3.5–5.1)
SODIUM: 136 meq/L (ref 135–145)

## 2014-12-29 LAB — MAGNESIUM: Magnesium: 2.2 mg/dL (ref 1.5–2.5)

## 2014-12-30 NOTE — Telephone Encounter (Signed)
Patient has follow up appointment with  Dr. [name] 01/19/15

## 2015-01-06 ENCOUNTER — Ambulatory Visit (HOSPITAL_COMMUNITY): Admission: RE | Admit: 2015-01-06 | Payer: Medicare Other | Source: Ambulatory Visit | Admitting: Cardiology

## 2015-01-06 ENCOUNTER — Encounter (HOSPITAL_COMMUNITY): Admission: RE | Payer: Self-pay | Source: Ambulatory Visit

## 2015-01-06 SURGERY — CARDIOVERSION
Anesthesia: Monitor Anesthesia Care

## 2015-01-19 ENCOUNTER — Ambulatory Visit (INDEPENDENT_AMBULATORY_CARE_PROVIDER_SITE_OTHER): Payer: Medicare Other | Admitting: Cardiology

## 2015-01-19 ENCOUNTER — Encounter: Payer: Self-pay | Admitting: Cardiology

## 2015-01-19 VITALS — BP 146/66 | HR 59 | Ht 71.0 in | Wt 199.4 lb

## 2015-01-19 DIAGNOSIS — I480 Paroxysmal atrial fibrillation: Secondary | ICD-10-CM

## 2015-01-19 NOTE — Progress Notes (Signed)
Cardiology Office Note   Date:  01/19/2015   ID:  [name], [name] 12-25-1940, [name]  PCP:  [name], MD  Cardiologist: [name] MD  No chief complaint on file.     History of Present Illness: Charles [name] is a 74 y.o. male who presents for follow-up office visit Charles [name] is a 74 y.o. male with history of paroxysmal atrial fibrillation (several prior DCCV, s/p PVI ablation 11/2012 by Dr. [name]), peripheral edema, presumed NICM with fluctuating EF (presumed tachy-mediated), HTN, HLD, CKD stage III, GERD, thyroid disease, OSA, chronic back pain who presents as an add-on for recurrent atrial fibrillation.   It looks as though his EF has varied over the years. It was down to 35-40% in 2012. An ischemic workup was not done, but Dr. [name] planned to proceed if EF was still down by repeat echo in 2013 - this had shown normalization of LV function. Cardiomyopathy was felt tachy-mediated. EF in 07/2012 was back to 40%, and then last echo 01/2014 following maintenance of NSR showed EF 55-60%, grade 2 DD, high ventricular filling pressure, mild MR, mod dilated LA, PASP . He has required intermittent metolazone due to fluid retention. Amiodarone was stopped due to tremor in 09/2011 but the tremor did not improve. Flecanide could not be used due to varying LV function. He was changed from Pradaxa to Coumadin in 07/2012 due to cost. He underwent mapping and ablation of atrial fib in 11/2012 by Dr. [name]. He had early recurrence of atrial fib in 12/2012 requiring repeat DCCV. Dr. [name] restarted amiodarone 02/2013 to try and achieve and maintain NSR to allow the heart to remodel. In 09/2013 amiodarone was decreased and he was changed to [name] (edoxaban). Amiodarone was stopped in 01/2014 due to maintenance of NSR. He saw Dr. [name] in 10/2014 and was doing well. Last labs 11/18/14 showed normal thyroid function, Cr 1.54, glucose 112, LDL 88, trig 190. He is  chronically on Mobic because nothing else has worked.   He was seen on 12/22/14 at which time he came in because of a work in visit for recurrent atrial fibrillation.  He was restarted on amiodarone 200 mg twice a day.  He called the office last week to notify us that on July 3 he felt that he gone back into normal sinus rhythm.  His heart rate dropped to the 50s when he returned to normal sinus rhythm.  He has been feeling well from a cardiac standpoint.  No chest pain.  No increased shortness of breath.  He had a recent chest x-ray which showed pleural plaques but no evidence of pulmonary fibrosis or congestive heart failure. The patient has developed some increased tremors of his hands from the amiodarone.  He also had this side effect when amiodarone was used previously. The patient has had some exacerbation of his chronic back pain.  He anticipates that he will need a epidural steroids injection from Dr. Darrelyn Hillock soon.  The patient is on [name] which we would need to hold for 48 hours prior to surgery   Past Medical History  Diagnosis Date  . Paroxysmal atrial fibrillation     a. H/o difficult to control. b. s/p PVI ablation 12-03-2012 by [name] with recurrence afterwards, requiring period of amiodarone.  . Peripheral edema   . Mitral regurgitation     a. Mild by echo in 01/2014.  Marland Kitchen Atrial dilatation     mild biatrial dilitation  . Nonischemic cardiomyopathy  a. presumed to be tachycardia mediated. Varying EFs over the years from 35-40% to normal and back.  . Hypertension   . Hypercholesterolemia   . CKD (chronic kidney disease), stage III   . GERD (gastroesophageal reflux disease)   . H/O hiatal hernia   . Arthritis   . Tear of medial meniscus of left knee   . Bruises easily   . Hypothyroidism   . Sleep apnea     USES C PAP  . Hyperglycemia   . Tremor     Past Surgical History  Procedure Laterality Date  . Lithotripsy      x 2  . Joint replacement  2005    total right  hip replacement  . Hernia repair  03/2005  . Kidney stent  1987, 02/2005  . Quadrec  1999    LFT leg quad sx  . Cardioversion N/A 09/04/2012    Procedure: CARDIOVERSION;  Surgeon: [name], MD;  Location: [facility] ENDOSCOPY;  Service: Cardiovascular;  Laterality: N/A;  . Tee without cardioversion N/A 12/02/2012    Procedure: TRANSESOPHAGEAL ECHOCARDIOGRAM (TEE);  Surgeon: [name], MD;  Location: [facility] ENDOSCOPY;  Service: Cardiovascular;  Laterality: N/A;  . Atrial fibrillation ablation  12-03-2012    PVI by [name]  . Cardioversion N/A 02/11/2013    Procedure: CARDIOVERSION;  Surgeon: [name], MD;  Location: [facility] ENDOSCOPY;  Service: Cardiovascular;  Laterality: N/A;  . Knee arthroscopy with medial menisectomy Left 11/20/2013    Procedure: LEFT KNEE ARTHROSCOPY WITH MEDIAL MENISECTOMY;  Surgeon: Jacki Cones, MD;  Location: [facility] ORS;  Service: Orthopedics;  Laterality: Left;  Microfracture of medial femoral chondyle and abrasion chondroplasty of the medial femoral chondyle  . Knee arthroscopy with drilling/microfracture Left 11/20/2013    Procedure: KNEE ARTHROSCOPY WITH DRILLING/MICROFRACTURE;  Surgeon: Jacki Cones, MD;  Location: [facility] ORS;  Service: Orthopedics;  Laterality: Left;  . Atrial fibrillation ablation N/A 12/03/2012    Procedure: ATRIAL FIBRILLATION ABLATION;  Surgeon: Hillis Range, MD;  Location: [facility] CATH LAB;  Service: Cardiovascular;  Laterality: N/A;     Current Outpatient Prescriptions  Medication Sig Dispense Refill  . amiodarone (PACERONE) 200 MG tablet Take 200 mg by mouth daily.    . Ascorbic Acid (VITAMIN C) 500 MG tablet Take 1,500 mg by mouth daily.      . Cholecalciferol (VITAMIN D) 2000 UNITS CAPS Take 4,000 Units by mouth daily.      . Coenzyme Q10 (COQ10 PO) Take 100 mg by mouth daily.     Marland Kitchen edoxaban ([name]) 60 MG TABS tablet Take 60 mg by mouth every evening. 90 tablet 3  . enalapril (VASOTEC) 20 MG tablet Take 0.5 tablets (10 mg total) by mouth  every evening. 45 tablet 3  . furosemide (LASIX) 40 MG tablet Take 1.5 tablets (60 mg total) by mouth daily. 135 tablet 3  . guaiFENesin (MUCINEX) 600 MG 12 hr tablet Take 600 mg by mouth 2 (two) times daily as needed for congestion.    Marland Kitchen levothyroxine (SYNTHROID, LEVOTHROID) 50 MCG tablet TAKE ONE TABLET BY MOUTH ONE TIME DAILY 90 tablet 3  . MAGNESIUM CITRATE PO Take 200 mg by mouth 2 (two) times daily.    . meloxicam (MOBIC) 15 MG tablet Take 15 mg by mouth every other day.    . metolazone (ZAROXOLYN) 5 MG tablet Take 5 mg by mouth daily as needed. Hold for now 12/23/14    . metoprolol succinate (TOPROL-XL) 50 MG 24 hr tablet Take 50 mg  by mouth as directed. Take 1 tablet in the morning and 1/2 tablet in the evening    . Misc Natural Products (OSTEO BI-FLEX JOINT SHIELD PO) Take 2 tablets by mouth daily.     . Multiple Vitamin (MULTIVITAMIN) tablet Take 1 tablet by mouth daily.      . Omega-3 Fatty Acids (FISH OIL) 1000 MG CAPS Take 1 capsule by mouth 2 (two) times daily.     Marland Kitchen omeprazole (PRILOSEC) 20 MG capsule Take 20 mg by mouth daily.    Marland Kitchen zinc gluconate 50 MG tablet Take 50 mg by mouth daily.       No current facility-administered medications for this visit.    Allergies:   Ceftriaxone sodium; Diltiazem hcl; and Penicillins    Social History:  The patient  reports that he has never smoked. He has never used smokeless tobacco. He reports that he does not drink alcohol or use illicit drugs.   Family History:  The patient's family history includes Cancer (age of onset: 72) in his mother; Heart disease (age of onset: 59) in his father; Lupus in his brother.    ROS:  Please see the history of present illness.   Otherwise, review of systems are positive for none.   All other systems are reviewed and negative.    PHYSICAL EXAM: VS:  BP 146/66 mmHg  Pulse 59  Ht 5\' 11"  (1.803 m)  Wt 199 lb 6.4 oz (90.447 kg)  BMI 27.82 kg/m2  SpO2 95% , BMI Body mass index is 27.82 kg/(m^2). GEN:  Well nourished, well developed, in no acute distress HEENT: normal Neck: no JVD, carotid bruits, or masses Cardiac: RRR; no murmurs, rubs, or gallops,no edema  Respiratory:  clear to auscultation bilaterally, normal work of breathing GI: soft, nontender, nondistended, + BS MS: no deformity or atrophy Skin: warm and dry, no rash Neuro:  Strength and sensation are intact Psych: euthymic mood, full affect   EKG:  EKG is ordered today. The ekg ordered today demonstrates sinus bradycardia at 55 bpm, otherwise normal EKG.   Recent Labs: 12/22/2014: ALT 28; Hemoglobin 12.3*; Platelets 192.0; TSH 1.87 12/29/2014: BUN 42*; Creatinine, Ser 1.82*; Magnesium 2.2; Potassium 3.8; Sodium 136    Lipid Panel    Component Value Date/Time   CHOL 165 11/18/2014 0943   TRIG 190.0* 11/18/2014 0943   HDL 39.30 11/18/2014 0943   CHOLHDL 4 11/18/2014 0943   VLDL 38.0 11/18/2014 0943   LDLCALC 88 11/18/2014 0943   LDLDIRECT 95.3 10/24/2011 1022      Wt Readings from Last 3 Encounters:  01/19/15 199 lb 6.4 oz (90.447 kg)  12/22/14 200 lb 12.8 oz (91.082 kg)  11/17/14 197 lb 12.8 oz (89.721 kg)         ASSESSMENT AND PLAN:  1.  Paroxysmal atrial fibrillation, now back in normal sinus rhythm on amiodarone 2.  Nonischemic cardiomyopathy 3.  Abnormal chest x-ray with calcified pleural plaque secondary to prior asbestos exposure 4.  Hypothyroidism 5.  Long-term use of anticoagulant.  Disposition: We are going to reduce his amiodarone to just 200 mg once a day.  This should help reduce the intensity of his tremor.  We will plan to have him return in one month for follow-up office visit and EKG.  At that point we may wish to reduce the amiodarone even further.   Current medicines are reviewed at length with the patient today.  The patient has concerns regarding medicines.  The following changes have been made:  no change  Labs/ tests ordered today include:  Orders Placed This Encounter    Procedures  . EKG 12-Lead     Signed, [name] MD 01/19/2015 7:23 PM    [facility] [address] [place], [place], [place]  [address] Phone: [phone]; Fax: [phone]

## 2015-01-19 NOTE — Patient Instructions (Signed)
Medication Instructions:  DECREASE YOUR AMIODARONE TO 200 MG DAILY  Labwork: NONE  Testing/Procedures: NONE  Follow-Up: Your physician recommends that you schedule a follow-up appointment in: 1 MONTH OV/EKG

## 2015-02-16 ENCOUNTER — Ambulatory Visit (INDEPENDENT_AMBULATORY_CARE_PROVIDER_SITE_OTHER): Payer: Medicare Other | Admitting: Cardiology

## 2015-02-16 ENCOUNTER — Encounter: Payer: Self-pay | Admitting: Cardiology

## 2015-02-16 VITALS — BP 134/62 | HR 56 | Ht 71.0 in | Wt 195.8 lb

## 2015-02-16 DIAGNOSIS — I480 Paroxysmal atrial fibrillation: Secondary | ICD-10-CM | POA: Diagnosis not present

## 2015-02-16 DIAGNOSIS — Z79899 Other long term (current) drug therapy: Secondary | ICD-10-CM

## 2015-02-16 NOTE — Progress Notes (Signed)
Cardiology Office Note   Date:  02/16/2015   ID:  Crayton, Savarese 10-21-1940, [name]  PCP:  [name], MD  Cardiologist: [name] MD  No chief complaint on file.     History of Present Illness: Charles [name] is a 74 y.o. male who presents for 2 month follow-up office visit.  He has a past history of paroxysmal atrial fibrillation and tachycardia mediated cardiomyopathy.He underwent mapping and ablation of atrial fib in 11/2012 by Dr. [name]. He had early recurrence of atrial fib in 12/2012 requiring repeat DCCV. Dr. [name] restarted amiodarone 02/2013 to try and achieve and maintain NSR to allow the heart to remodel. In 09/2013 amiodarone was decreased and he was changed to [name] (edoxaban). Amiodarone was stopped in 01/2014 due to maintenance of NSR. He saw Dr. [name] in 10/2014 and was doing well  He was seen on 12/22/14 at which time he came in because of a work in visit for recurrent atrial fibrillation. He was restarted on amiodarone 200 mg twice a day. He called the office last week to notify us that on July 3 he felt that he gone back into normal sinus rhythm. His heart rate dropped to the 50s when he returned to normal sinus rhythm. He has been feeling well from a cardiac standpoint. No chest pain. No increased shortness of breath. He had a recent chest x-ray which showed pleural plaques but no evidence of pulmonary fibrosis or congestive heart failure. The patient has developed some increased tremors of his hands from the amiodarone. He also had this side effect when amiodarone was used previously. The patient has had some exacerbation of his chronic back pain. He anticipates that he will need a epidural steroids injection from Dr. Darrelyn Hillock soon. The patient is on [name] which we would need to hold for 48 hours prior to surgery. Since last visit he has been feeling well.  He has not been aware of any recurrent atrial fibrillation.  His tremor  varies from day to day and he does not think that there has been much change since last visit when we reduced his amiodarone dose from 400 mg down to 200 mg daily.  Past Medical History  Diagnosis Date  . Paroxysmal atrial fibrillation     a. H/o difficult to control. b. s/p PVI ablation 12-03-2012 by [name] with recurrence afterwards, requiring period of amiodarone.  . Peripheral edema   . Mitral regurgitation     a. Mild by echo in 01/2014.  Marland Kitchen Atrial dilatation     mild biatrial dilitation  . Nonischemic cardiomyopathy     a. presumed to be tachycardia mediated. Varying EFs over the years from 35-40% to normal and back.  . Hypertension   . Hypercholesterolemia   . CKD (chronic kidney disease), stage III   . GERD (gastroesophageal reflux disease)   . H/O hiatal hernia   . Arthritis   . Tear of medial meniscus of left knee   . Bruises easily   . Hypothyroidism   . Sleep apnea     USES C PAP  . Hyperglycemia   . Tremor     Past Surgical History  Procedure Laterality Date  . Lithotripsy      x 2  . Joint replacement  2005    total right hip replacement  . Hernia repair  03/2005  . Kidney stent  1987, 02/2005  . Quadrec  1999    LFT leg quad sx  .  Cardioversion N/A 09/04/2012    Procedure: CARDIOVERSION;  Surgeon: [name], MD;  Location: [facility] ENDOSCOPY;  Service: Cardiovascular;  Laterality: N/A;  . Tee without cardioversion N/A 12/02/2012    Procedure: TRANSESOPHAGEAL ECHOCARDIOGRAM (TEE);  Surgeon: [name], MD;  Location: [facility] ENDOSCOPY;  Service: Cardiovascular;  Laterality: N/A;  . Atrial fibrillation ablation  12-03-2012    PVI by [name]  . Cardioversion N/A 02/11/2013    Procedure: CARDIOVERSION;  Surgeon: [name], MD;  Location: [facility] ENDOSCOPY;  Service: Cardiovascular;  Laterality: N/A;  . Knee arthroscopy with medial menisectomy Left 11/20/2013    Procedure: LEFT KNEE ARTHROSCOPY WITH MEDIAL MENISECTOMY;  Surgeon: Jacki Cones, MD;  Location: [facility]  ORS;  Service: Orthopedics;  Laterality: Left;  Microfracture of medial femoral chondyle and abrasion chondroplasty of the medial femoral chondyle  . Knee arthroscopy with drilling/microfracture Left 11/20/2013    Procedure: KNEE ARTHROSCOPY WITH DRILLING/MICROFRACTURE;  Surgeon: Jacki Cones, MD;  Location: [facility] ORS;  Service: Orthopedics;  Laterality: Left;  . Atrial fibrillation ablation N/A 12/03/2012    Procedure: ATRIAL FIBRILLATION ABLATION;  Surgeon: Hillis Range, MD;  Location: [facility] CATH LAB;  Service: Cardiovascular;  Laterality: N/A;     Current Outpatient Prescriptions  Medication Sig Dispense Refill  . amiodarone (PACERONE) 200 MG tablet Take 200 mg by mouth as directed. Take 1/2 tablet by mouth daily    . Ascorbic Acid (VITAMIN C) 500 MG tablet Take 1,500 mg by mouth daily.      . Cholecalciferol (VITAMIN D) 2000 UNITS CAPS Take 4,000 Units by mouth daily.      . Coenzyme Q10 (COQ10 PO) Take 100 mg by mouth daily.     Marland Kitchen edoxaban ([name]) 60 MG TABS tablet Take 60 mg by mouth every evening. 90 tablet 3  . enalapril (VASOTEC) 20 MG tablet Take 0.5 tablets (10 mg total) by mouth every evening. 45 tablet 3  . furosemide (LASIX) 40 MG tablet Take 1.5 tablets (60 mg total) by mouth daily. 135 tablet 3  . guaiFENesin (MUCINEX) 600 MG 12 hr tablet Take 600 mg by mouth 2 (two) times daily as needed for congestion.    Marland Kitchen levothyroxine (SYNTHROID, LEVOTHROID) 50 MCG tablet TAKE ONE TABLET BY MOUTH ONE TIME DAILY 90 tablet 3  . MAGNESIUM CITRATE PO Take 200 mg by mouth daily.     . meloxicam (MOBIC) 15 MG tablet Take 15 mg by mouth every other day.    . metoprolol succinate (TOPROL-XL) 50 MG 24 hr tablet Take 50 mg by mouth as directed. Take 1 tablet in the morning and 1/2 tablet in the evening    . Misc Natural Products (OSTEO BI-FLEX JOINT SHIELD PO) Take 2 tablets by mouth daily.     . Multiple Vitamin (MULTIVITAMIN) tablet Take 1 tablet by mouth daily.      . Omega-3 Fatty Acids (FISH  OIL) 1000 MG CAPS Take 1 capsule by mouth 2 (two) times daily.     Marland Kitchen omeprazole (PRILOSEC) 20 MG capsule Take 20 mg by mouth daily.    Marland Kitchen zinc gluconate 50 MG tablet Take 50 mg by mouth daily.      . metolazone (ZAROXOLYN) 5 MG tablet Take 5 mg by mouth daily as needed. Hold for now 12/23/14     No current facility-administered medications for this visit.    Allergies:   Ceftriaxone sodium; Diltiazem hcl; and Penicillins    Social History:  The patient  reports that he has never smoked. He  has never used smokeless tobacco. He reports that he does not drink alcohol or use illicit drugs.   Family History:  The patient's family history includes Cancer (age of onset: 55) in his mother; Heart disease (age of onset: 65) in his father; Lupus in his brother.    ROS:  Please see the history of present illness.   Otherwise, review of systems are positive for none.   All other systems are reviewed and negative.    PHYSICAL EXAM: VS:  BP 134/62 mmHg  Pulse 56  Ht  (1.803 m)  Wt 195 lb 12.8 oz (88.814 kg)  BMI 27.32 kg/m2 , BMI Body mass index is 27.32 kg/(m^2). GEN: Well nourished, well developed, in no acute distress HEENT: normal Neck: no JVD, carotid bruits, or masses Cardiac: RRR; no murmurs, rubs, or gallops,no edema  Respiratory:  clear to auscultation bilaterally, normal work of breathing GI: soft, nontender, nondistended, + BS MS: no deformity or atrophy Skin: warm and dry, no rash Neuro:  Strength and sensation are intact Psych: euthymic mood, full affect   EKG:  EKG is ordered today. The ekg ordered today demonstrates sinus bradycardia 56 bpm.  Otherwise normal   Recent Labs: 12/22/2014: ALT 28; Hemoglobin 12.3*; Platelets 192.0; TSH 1.87 12/29/2014: BUN 42*; Creatinine, Ser 1.82*; Magnesium 2.2; Potassium 3.8; Sodium 136    Lipid Panel    Component Value Date/Time   CHOL 165 11/18/2014 0943   TRIG 190.0* 11/18/2014 0943   HDL 39.30 11/18/2014 0943   CHOLHDL 4  11/18/2014 0943   VLDL 38.0 11/18/2014 0943   LDLCALC 88 11/18/2014 0943   LDLDIRECT 95.3 10/24/2011 1022      Wt Readings from Last 3 Encounters:  02/16/15 195 lb 12.8 oz (88.814 kg)  01/19/15 199 lb 6.4 oz (90.447 kg)  12/22/14 200 lb 12.8 oz (91.082 kg)        ASSESSMENT AND PLAN:  1. Paroxysmal atrial fibrillation, now back in normal sinus rhythm on amiodarone 2. Nonischemic cardiomyopathy 3. Abnormal chest x-ray with calcified pleural plaque secondary to prior asbestos exposure 4. Hypothyroidism 5. Long-term use of anticoagulant.  Disposition: At this time we will reduce his amiodarone further down to just 100 mg daily.  Recheck in early November for office visit EKG CBC TSH free T4 serum magnesium basal metabolic panel hepatic function panel and CBC to monitor his amiodarone   Current medicines are reviewed at length with the patient today.  The patient does not have concerns regarding medicines.  The following changes have been made:  no change  Labs/ tests ordered today include:   Orders Placed This Encounter  Procedures  . TSH  . Hepatic function panel  . Basic metabolic panel  . CBC with Differential/Platelet  . T4, free  . Magnesium  . EKG 12-Lead       SignedCassell Clement MD 02/16/2015 12:01 PM    [facility] [address] [place], [place], [place]  [address] Phone: [phone]; Fax: [phone]

## 2015-02-16 NOTE — Patient Instructions (Signed)
Medication Instructions:  DECREASE YOUR AMIODARONE TO 100 MG DAILY   Labwork: NONE  Testing/Procedures: NONE  Follow-Up: Your physician recommends that you schedule a follow-up appointment in: EARLY November ov/ekg/cbc/tsh/ft4/bmet/hfp/magnesium

## 2015-02-19 ENCOUNTER — Telehealth: Payer: Self-pay

## 2015-02-19 NOTE — Telephone Encounter (Signed)
[name] savings card provided to patient per request.

## 2015-04-26 ENCOUNTER — Other Ambulatory Visit: Payer: Self-pay | Admitting: Cardiology

## 2015-05-03 ENCOUNTER — Ambulatory Visit (INDEPENDENT_AMBULATORY_CARE_PROVIDER_SITE_OTHER): Payer: Medicare Other | Admitting: Pharmacist

## 2015-05-03 ENCOUNTER — Encounter: Payer: Self-pay | Admitting: Cardiology

## 2015-05-03 ENCOUNTER — Other Ambulatory Visit: Payer: Medicare Other

## 2015-05-03 ENCOUNTER — Ambulatory Visit (INDEPENDENT_AMBULATORY_CARE_PROVIDER_SITE_OTHER): Payer: Medicare Other | Admitting: Cardiology

## 2015-05-03 VITALS — BP 138/70 | HR 57 | Ht 71.0 in | Wt 199.6 lb

## 2015-05-03 DIAGNOSIS — I429 Cardiomyopathy, unspecified: Secondary | ICD-10-CM

## 2015-05-03 DIAGNOSIS — Z7901 Long term (current) use of anticoagulants: Secondary | ICD-10-CM

## 2015-05-03 DIAGNOSIS — I4891 Unspecified atrial fibrillation: Secondary | ICD-10-CM

## 2015-05-03 DIAGNOSIS — I480 Paroxysmal atrial fibrillation: Secondary | ICD-10-CM

## 2015-05-03 DIAGNOSIS — Z79899 Other long term (current) drug therapy: Secondary | ICD-10-CM | POA: Diagnosis not present

## 2015-05-03 DIAGNOSIS — I428 Other cardiomyopathies: Secondary | ICD-10-CM

## 2015-05-03 NOTE — Patient Instructions (Signed)
Medication Instructions:  Your physician recommends that you continue on your current medications as directed. Please refer to the Current Medication list given to you today.  Labwork: CBC/TSH/FT4/HFP/BMET/MAGNESIUM   Testing/Procedures: NONE   Follow-Up: Your physician recommends that you schedule a follow-up appointment in: 07/2015  If you need a refill on your cardiac medications before your next appointment, please call your pharmacy.

## 2015-05-03 NOTE — Progress Notes (Signed)
Cardiology Office Note   Date:  05/04/2015   ID:  REVIS WHALIN, DOB May 22, 1941, MRN [id number]  PCP:  [name], MD  Cardiologist: [name] MD  Chief Complaint  Patient presents with  . Atrial Fibrillation      History of Present Illness: Charles [name] is a 74 y.o. male who presents for follow-up office visit  Charles [name] is a 74 y.o. male who presents for 2 month follow-up office visit. He has a past history of paroxysmal atrial fibrillation and tachycardia mediated cardiomyopathy.He underwent mapping and ablation of atrial fib in 11/2012 by Dr. [name]. He had early recurrence of atrial fib in 12/2012 requiring repeat DCCV. Dr. [name] restarted amiodarone 02/2013 to try and achieve and maintain NSR to allow the heart to remodel. In 09/2013 amiodarone was decreased and he was changed to [name] (edoxaban). Amiodarone was stopped in 01/2014 due to maintenance of NSR.   He was seen on 12/22/14 at which time he came in because of a work in visit for recurrent atrial fibrillation. He was restarted on amiodarone 200 mg twice a day. He called the office last week to notify us that on July 3 he felt that he gone back into normal sinus rhythm. His heart rate dropped to the 50s when he returned to normal sinus rhythm. He has been feeling well from a cardiac standpoint. No chest pain. No increased shortness of breath. He had a recent chest x-ray which showed pleural plaques but no evidence of pulmonary fibrosis or congestive heart failure. The patient has developed some increased tremors of his hands from the amiodarone. He also had this side effect when amiodarone was used previously.  History of MI has improved since we cut back on his amiodarone dose to just 100 mg daily The patient has had some exacerbation of his chronic back pain.He also is anticipating a left hip replacement by Dr. Darrelyn Hillock the week of February 13. The patient is on [name] which we would need to  hold for 48 hours prior to surgery. Since last visit he has been feeling well. He has not been aware of any recurrent atrial fibrillation. He has not been having any chest pain or shortness of breath or peripheral edema.  Past Medical History  Diagnosis Date  . Paroxysmal atrial fibrillation (HCC)     a. H/o difficult to control. b. s/p PVI ablation 12-03-2012 by [name] with recurrence afterwards, requiring period of amiodarone.  . Peripheral edema   . Mitral regurgitation     a. Mild by echo in 01/2014.  Marland Kitchen Atrial dilatation     mild biatrial dilitation  . Nonischemic cardiomyopathy (HCC)     a. presumed to be tachycardia mediated. Varying EFs over the years from 35-40% to normal and back.  . Hypertension   . Hypercholesterolemia   . CKD (chronic kidney disease), stage III   . GERD (gastroesophageal reflux disease)   . H/O hiatal hernia   . Arthritis   . Tear of medial meniscus of left knee   . Bruises easily   . Hypothyroidism   . Sleep apnea     USES C PAP  . Hyperglycemia   . Tremor     Past Surgical History  Procedure Laterality Date  . Lithotripsy      x 2  . Joint replacement  2005    total right hip replacement  . Hernia repair  03/2005  . Kidney stent  1987, 02/2005  . Sherrilyn Rist  1999    LFT leg quad sx  . Cardioversion N/A 09/04/2012    Procedure: CARDIOVERSION;  Surgeon: [name], MD;  Location: [facility] ENDOSCOPY;  Service: Cardiovascular;  Laterality: N/A;  . Tee without cardioversion N/A 12/02/2012    Procedure: TRANSESOPHAGEAL ECHOCARDIOGRAM (TEE);  Surgeon: [name], MD;  Location: [facility] ENDOSCOPY;  Service: Cardiovascular;  Laterality: N/A;  . Atrial fibrillation ablation  12-03-2012    PVI by [name]  . Cardioversion N/A 02/11/2013    Procedure: CARDIOVERSION;  Surgeon: [name], MD;  Location: [facility] ENDOSCOPY;  Service: Cardiovascular;  Laterality: N/A;  . Knee arthroscopy with medial menisectomy Left 11/20/2013    Procedure: LEFT KNEE ARTHROSCOPY  WITH MEDIAL MENISECTOMY;  Surgeon: Jacki Cones, MD;  Location: [facility] ORS;  Service: Orthopedics;  Laterality: Left;  Microfracture of medial femoral chondyle and abrasion chondroplasty of the medial femoral chondyle  . Knee arthroscopy with drilling/microfracture Left 11/20/2013    Procedure: KNEE ARTHROSCOPY WITH DRILLING/MICROFRACTURE;  Surgeon: Jacki Cones, MD;  Location: [facility] ORS;  Service: Orthopedics;  Laterality: Left;  . Atrial fibrillation ablation N/A 12/03/2012    Procedure: ATRIAL FIBRILLATION ABLATION;  Surgeon: Hillis Range, MD;  Location: [facility] CATH LAB;  Service: Cardiovascular;  Laterality: N/A;     Current Outpatient Prescriptions  Medication Sig Dispense Refill  . amiodarone (PACERONE) 200 MG tablet Take 100 mg by mouth daily.    . Ascorbic Acid (VITAMIN C) 500 MG tablet Take 1,500 mg by mouth daily.      . Cholecalciferol (VITAMIN D) 2000 UNITS CAPS Take 4,000 Units by mouth daily.      . Coenzyme Q10 (COQ10 PO) Take 100 mg by mouth daily.     . enalapril (VASOTEC) 20 MG tablet Take 0.5 tablets (10 mg total) by mouth every evening. 45 tablet 3  . furosemide (LASIX) 40 MG tablet Take 1.5 tablets (60 mg total) by mouth daily. 135 tablet 3  . guaiFENesin (MUCINEX) 600 MG 12 hr tablet Take 600 mg by mouth 2 (two) times daily as needed for congestion.    Marland Kitchen levothyroxine (SYNTHROID, LEVOTHROID) 50 MCG tablet TAKE ONE TABLET BY MOUTH ONE TIME DAILY 90 tablet 3  . MAGNESIUM CITRATE PO Take 200 mg by mouth daily.     . meloxicam (MOBIC) 7.5 MG tablet Take 7.5 mg by mouth every other day.    . metolazone (ZAROXOLYN) 5 MG tablet Take 5 mg by mouth daily as needed. Hold for now 12/23/14    . metoprolol succinate (TOPROL-XL) 50 MG 24 hr tablet Take 50 mg by mouth as directed. Take 1 tablet in the morning and 1/2 tablet in the evening    . Misc Natural Products (OSTEO BI-FLEX JOINT SHIELD PO) Take 2 tablets by mouth daily.     . Multiple Vitamin (MULTIVITAMIN) tablet Take 1 tablet by  mouth daily.      . Omega-3 Fatty Acids (FISH OIL) 1000 MG CAPS Take 1 capsule by mouth 2 (two) times daily.     Marland Kitchen omeprazole (PRILOSEC) 20 MG capsule Take 20 mg by mouth daily.    Marland Kitchen [name] 60 MG TABS tablet TAKE 1 TAB BY MOUTH EVERY EVENING. 90 tablet 2  . zinc gluconate 50 MG tablet Take 50 mg by mouth daily.       No current facility-administered medications for this visit.    Allergies:   Ceftriaxone sodium; Diltiazem hcl; and Penicillins    Social History:  The patient  reports that he has never  smoked. He has never used smokeless tobacco. He reports that he does not drink alcohol or use illicit drugs.   Family History:  The patient's family history includes Cancer (age of onset: 23) in his mother; Heart disease (age of onset: 36) in his father; Lupus in his brother.    ROS:  Please see the history of present illness.   Otherwise, review of systems are positive for none.   All other systems are reviewed and negative.    PHYSICAL EXAM: VS:  BP 138/70 mmHg  Pulse 57  Ht 5\' 11"  (1.803 m)  Wt 199 lb 9.6 oz (90.538 kg)  BMI 27.85 kg/m2 , BMI Body mass index is 27.85 kg/(m^2). GEN: Well nourished, well developed, in no acute distress HEENT: normal Neck: no JVD, carotid bruits, or masses Cardiac: RRR; no murmurs, rubs, or gallops,no edema  Respiratory:  clear to auscultation bilaterally, normal work of breathing GI: soft, nontender, nondistended, + BS MS: no deformity or atrophy Skin: warm and dry, no rash Neuro:  Strength and sensation are intact Psych: euthymic mood, full affect   EKG:  EKG is ordered today. The ekg ordered today demonstrates sinus bradycardia at 57 bpm.  QTc interval is 428.   Recent Labs: 12/22/2014: ALT 28; Hemoglobin 12.3*; Platelets 192.0; TSH 1.87 12/29/2014: BUN 42*; Creatinine, Ser 1.82*; Magnesium 2.2; Potassium 3.8; Sodium 136    Lipid Panel    Component Value Date/Time   CHOL 165 11/18/2014 0943   TRIG 190.0* 11/18/2014 0943   HDL 39.30  11/18/2014 0943   CHOLHDL 4 11/18/2014 0943   VLDL 38.0 11/18/2014 0943   LDLCALC 88 11/18/2014 0943   LDLDIRECT 95.3 10/24/2011 1022      Wt Readings from Last 3 Encounters:  05/03/15 199 lb 9.6 oz (90.538 kg)  02/16/15 195 lb 12.8 oz (88.814 kg)  01/19/15 199 lb 6.4 oz (90.447 kg)        ASSESSMENT AND PLAN:  1. Paroxysmal atrial fibrillation, now back in normal sinus rhythm on amiodarone 2. Nonischemic cardiomyopathy 3. Abnormal chest x-ray with calcified pleural plaque secondary to prior asbestos exposure 4. Hypothyroidism 5. Long-term use of anticoagulant.  Disposition: At this time we will reduce his amiodarone further down to just 100 mg daily. Recheck in early November for office visit EKG CBC TSH free T4 serum magnesium basal metabolic panel hepatic function panel and CBC to monitor his amiodarone   Current medicines are reviewed at length with the patient today.  The patient does not have concerns regarding medicines.  The following changes have been made:  no change  Labs/ tests ordered today include:   Orders Placed This Encounter  Procedures  . Hepatic function panel  . Basic metabolic panel  . CBC with Differential/Platelet  . Magnesium  . TSH  . T4, free  . EKG 12-Lead    Disposition: I will want to see him the week prior to his anticipated left hip surgery.  Continue current medication.  Karie Schwalbe MD 05/04/2015 12:29 PM    [facility] [address] [place], [place], [place]  [address] Phone: [phone]; Fax: [phone]

## 2015-05-03 NOTE — Progress Notes (Signed)
Pt was started on [name] 60mg  daily for Atrial Fib on October 22, 2013.  Reviewed patients medication list. Pt is not currently on any combined P-gp and strong CYP3A4 inhibitors/inducers (ketoconazole, itraconazole, ritonavir, carbamazepine, phenytoin, rifampin, St. John's wort). Reviewed labs. SCr 1.69, Weight 90.7 kg, CrCl 74mL/min . Dose is appropriate based on CrCl.   A full discussion of the nature of anticoagulants has been carried out. A benefit/risk analysis has been presented to the patient, so that they understand the justification for choosing anticoagulation with [name] at this time. The need for compliance is stressed. Pt is aware to take the medication once daily. Side effects of potential bleeding are discussed, including unusual colored urine or stools, coughing up blood or coffee ground emesis, nose bleeds or serious fall or head trauma. Discussed signs and symptoms of stroke. The patient should avoid any OTC items containing aspirin or ibuprofen. Avoid alcohol consumption. Call if any signs of abnormal bleeding. Discussed financial obligations and states not having any problems in obtaining medication.Pt states has had no sign or symptom of bleeding nor any sign or symptom of stroke . Denies any missed doses.   Patient coming back for lab work in 2 days - also needed TSH checked today and lab was out of necessary tubes. Had labs drawn 2 days later but CBC was never drawn...will not make patient come back in again since reason in the first place was to avoid 2 blood draws and pt has been on [name] for a year and a half with stable CBC. Called pt with lab results 11/9 and pt is aware to continue on current dose of [name].  Pt will follow up with Dr. [name] in February as scheduled. Will check CBC/BMET at that time.

## 2015-05-05 ENCOUNTER — Other Ambulatory Visit: Payer: Medicare Other | Admitting: *Deleted

## 2015-05-05 ENCOUNTER — Other Ambulatory Visit (INDEPENDENT_AMBULATORY_CARE_PROVIDER_SITE_OTHER): Payer: Medicare Other | Admitting: *Deleted

## 2015-05-05 ENCOUNTER — Other Ambulatory Visit: Payer: Self-pay | Admitting: Cardiology

## 2015-05-05 DIAGNOSIS — I4891 Unspecified atrial fibrillation: Secondary | ICD-10-CM

## 2015-05-05 LAB — LIPID PANEL
CHOL/HDL RATIO: 5.5 ratio — AB (ref <=5.0)
Cholesterol: 180 mg/dL (ref 125–200)
HDL: 33 mg/dL — AB (ref >=40)
LDL CALC: 97 mg/dL (ref <130)
TRIGLYCERIDES: 248 mg/dL — AB (ref <150)
VLDL: 50 mg/dL — AB (ref <30)

## 2015-05-05 LAB — BASIC METABOLIC PANEL
BUN: 40 mg/dL — AB (ref 7–25)
CHLORIDE: 98 mmol/L (ref 98–110)
CO2: 27 mmol/L (ref 20–31)
Calcium: 9.5 mg/dL (ref 8.6–10.3)
Creat: 1.69 mg/dL — ABNORMAL HIGH (ref 0.70–1.18)
Glucose, Bld: 107 mg/dL — ABNORMAL HIGH (ref 65–99)
POTASSIUM: 4 mmol/L (ref 3.5–5.3)
SODIUM: 135 mmol/L (ref 135–146)

## 2015-05-05 LAB — TSH: TSH: 4.349 u[IU]/mL (ref 0.350–4.500)

## 2015-05-05 LAB — HEPATIC FUNCTION PANEL
ALBUMIN: 4.1 g/dL (ref 3.6–5.1)
ALT: 14 U/L (ref 9–46)
AST: 22 U/L (ref 10–35)
Alkaline Phosphatase: 56 U/L (ref 40–115)
BILIRUBIN INDIRECT: 0.5 mg/dL (ref 0.2–1.2)
Bilirubin, Direct: 0.1 mg/dL (ref <=0.2)
TOTAL PROTEIN: 6.6 g/dL (ref 6.1–8.1)
Total Bilirubin: 0.6 mg/dL (ref 0.2–1.2)

## 2015-05-05 NOTE — Addendum Note (Signed)
Addended by: Tonita Phoenix on: 05/05/2015 10:22 AM   Modules accepted: Orders

## 2015-05-06 LAB — MAGNESIUM: MAGNESIUM: 2 mg/dL (ref 1.5–2.5)

## 2015-05-06 NOTE — Progress Notes (Signed)
Quick Note:  Please report to patient. The recent labs are stable. Continue same medication and careful diet. Kidney function is slightly better. The thyroid levels are normal. The liver function tests are normal. The cholesterol is normal but the triglycerides are high. Watch carbohydrates. ______

## 2015-05-06 NOTE — Progress Notes (Signed)
Quick Note:  Please report to patient. The recent labs are stable. Continue same medication and careful diet. ______ 

## 2015-05-18 ENCOUNTER — Other Ambulatory Visit: Payer: Self-pay | Admitting: Surgical

## 2015-06-05 ENCOUNTER — Other Ambulatory Visit: Payer: Self-pay | Admitting: Cardiology

## 2015-07-29 ENCOUNTER — Encounter (HOSPITAL_COMMUNITY): Payer: Self-pay

## 2015-07-29 ENCOUNTER — Encounter (HOSPITAL_COMMUNITY)
Admission: RE | Admit: 2015-07-29 | Discharge: 2015-07-29 | Disposition: A | Discharge status: Home / Self Care | Payer: Medicare Other | Source: Ambulatory Visit | Attending: Orthopedic Surgery | Admitting: Orthopedic Surgery

## 2015-07-29 DIAGNOSIS — M1612 Unilateral primary osteoarthritis, left hip: Secondary | ICD-10-CM | POA: Insufficient documentation

## 2015-07-29 DIAGNOSIS — Z01812 Encounter for preprocedural laboratory examination: Secondary | ICD-10-CM | POA: Diagnosis not present

## 2015-07-29 HISTORY — DX: Cardiac arrhythmia, unspecified: I49.9

## 2015-07-29 HISTORY — DX: Atherosclerotic heart disease of native coronary artery without angina pectoris: I25.10

## 2015-07-29 LAB — CBC WITH DIFFERENTIAL/PLATELET
Basophils Absolute: 0 10*3/uL (ref 0.0–0.1)
Basophils Relative: 0 %
Eosinophils Absolute: 0.2 10*3/uL (ref 0.0–0.7)
Eosinophils Relative: 2 %
HCT: 37.2 % — ABNORMAL LOW (ref 39.0–52.0)
Hemoglobin: 12.2 g/dL — ABNORMAL LOW (ref 13.0–17.0)
Lymphocytes Relative: 43 %
Lymphs Abs: 3.9 10*3/uL (ref 0.7–4.0)
MCH: 29.3 pg (ref 26.0–34.0)
MCHC: 32.8 g/dL (ref 30.0–36.0)
MCV: 89.4 fL (ref 78.0–100.0)
Monocytes Absolute: 0.9 10*3/uL (ref 0.1–1.0)
Monocytes Relative: 9 %
Neutro Abs: 4.1 10*3/uL (ref 1.7–7.7)
Neutrophils Relative %: 46 %
Platelets: 230 10*3/uL (ref 150–400)
RBC: 4.16 MIL/uL — ABNORMAL LOW (ref 4.22–5.81)
RDW: 13 % (ref 11.5–15.5)
WBC: 9 10*3/uL (ref 4.0–10.5)

## 2015-07-29 LAB — SURGICAL PCR SCREEN
MRSA, PCR: NEGATIVE
Staphylococcus aureus: POSITIVE — AB

## 2015-07-29 LAB — URINALYSIS, ROUTINE W REFLEX MICROSCOPIC
Bilirubin Urine: NEGATIVE
Glucose, UA: NEGATIVE mg/dL
Hgb urine dipstick: NEGATIVE
Ketones, ur: NEGATIVE mg/dL
Leukocytes, UA: NEGATIVE
Nitrite: NEGATIVE
Protein, ur: NEGATIVE mg/dL
Specific Gravity, Urine: 1.009 (ref 1.005–1.030)
pH: 5 (ref 5.0–8.0)

## 2015-07-29 LAB — COMPREHENSIVE METABOLIC PANEL
ALT: 17 U/L (ref 17–63)
AST: 44 U/L — ABNORMAL HIGH (ref 15–41)
Albumin: 4.6 g/dL (ref 3.5–5.0)
Alkaline Phosphatase: 55 U/L (ref 38–126)
Anion gap: 11 (ref 5–15)
BUN: 48 mg/dL — ABNORMAL HIGH (ref 6–20)
CO2: 28 mmol/L (ref 22–32)
Calcium: 10.1 mg/dL (ref 8.9–10.3)
Chloride: 101 mmol/L (ref 101–111)
Creatinine, Ser: 1.94 mg/dL — ABNORMAL HIGH (ref 0.61–1.24)
GFR calc Af Amer: 37 mL/min — ABNORMAL LOW (ref >60)
GFR calc non Af Amer: 32 mL/min — ABNORMAL LOW (ref >60)
Glucose, Bld: 110 mg/dL — ABNORMAL HIGH (ref 65–99)
Potassium: 4.8 mmol/L (ref 3.5–5.1)
Sodium: 140 mmol/L (ref 135–145)
Total Bilirubin: 0.7 mg/dL (ref 0.3–1.2)
Total Protein: 7.9 g/dL (ref 6.5–8.1)

## 2015-07-29 LAB — APTT: aPTT: 33 seconds (ref 24–37)

## 2015-07-29 LAB — PROTIME-INR
INR: 1.37 (ref 0.00–1.49)
Prothrombin Time: 17 seconds — ABNORMAL HIGH (ref 11.6–15.2)

## 2015-07-29 NOTE — Patient Instructions (Signed)
Charles [name]  07/29/2015   Your procedure is scheduled on:  Thursday 08/12/2015  Report to [facility]  Entrance take [place]  elevators to 3rd floor to  Short Stay Center at  0530 AM.  Call this number if you have problems the morning of surgery [phone]   Remember: ONLY 1 PERSON MAY GO WITH YOU TO SHORT STAY TO GET  READY MORNING OF YOUR SURGERY.   Do not eat food or drink liquids :After Midnight.     Take these medicines the morning of surgery with A SIP OF WATER: Amiodarone, Metoprolol, Levothyroxine, use eye drops if needed               BRING CPAP MASK AND TUBING WITH YOU TO HOSPITAL MORNING OF SURGERY!                               You may not have any metal on your body including hair pins and              piercings  Do not wear jewelry, make-up, lotions, powders or perfumes, deodorant             Do not wear nail polish.  Do not shave  48 hours prior to surgery.              Men may shave face and neck.   Do not bring valuables to the hospital. [facility] IS NOT             RESPONSIBLE   FOR VALUABLES.  Contacts, dentures or bridgework may not be worn into surgery.  Leave suitcase in the car. After surgery it may be brought to your room.                  Please read over the following fact sheets you were given: _____________________________________________________________________             [facility] - Preparing for Surgery Before surgery, you can play an important role.  Because skin is not sterile, your skin needs to be as free of germs as possible.  You can reduce the number of germs on your skin by washing with CHG (chlorahexidine gluconate) soap before surgery.  CHG is an antiseptic cleaner which kills germs and bonds with the skin to continue killing germs even after washing. Please DO NOT use if you have an allergy to CHG or antibacterial soaps.  If your skin becomes reddened/irritated stop using the CHG and inform your nurse  when you arrive at Short Stay. Do not shave (including legs and underarms) for at least 48 hours prior to the first CHG shower.  You may shave your face/neck. Please follow these instructions carefully:  1.  Shower with CHG Soap the night before surgery and the  morning of Surgery.  2.  If you choose to wash your hair, wash your hair first as usual with your  normal  shampoo.  3.  After you shampoo, rinse your hair and body thoroughly to remove the  shampoo.                           4.  Use CHG as you would any other liquid soap.  You can apply chg directly  to the skin and wash  Gently with a scrungie or clean washcloth.  5.  Apply the CHG Soap to your body ONLY FROM THE NECK DOWN.   Do not use on face/ open                           Wound or open sores. Avoid contact with eyes, ears mouth and genitals (private parts).                       Wash face,  Genitals (private parts) with your normal soap.             6.  Wash thoroughly, paying special attention to the area where your surgery  will be performed.  7.  Thoroughly rinse your body with warm water from the neck down.  8.  DO NOT shower/wash with your normal soap after using and rinsing off  the CHG Soap.                9.  Pat yourself dry with a clean towel.            10.  Wear clean pajamas.            11.  Place clean sheets on your bed the night of your first shower and do not  sleep with pets. Day of Surgery : Do not apply any lotions/deodorants the morning of surgery.  Please wear clean clothes to the hospital/surgery center.  FAILURE TO FOLLOW THESE INSTRUCTIONS MAY RESULT IN THE CANCELLATION OF YOUR SURGERY PATIENT SIGNATURE_________________________________  NURSE SIGNATURE__________________________________  ________________________________________________________________________   Charles [name]  An incentive spirometer is a tool that can help keep your lungs clear and active. This tool  measures how well you are filling your lungs with each breath. Taking long deep breaths may help reverse or decrease the chance of developing breathing (pulmonary) problems (especially infection) following:  A long period of time when you are unable to move or be active. BEFORE THE PROCEDURE   If the spirometer includes an indicator to show your best effort, your nurse or respiratory therapist will set it to a desired goal.  If possible, sit up straight or lean slightly forward. Try not to slouch.  Hold the incentive spirometer in an upright position. INSTRUCTIONS FOR USE   Sit on the edge of your bed if possible, or sit up as far as you can in bed or on a chair.  Hold the incentive spirometer in an upright position.  Breathe out normally.  Place the mouthpiece in your mouth and seal your lips tightly around it.  Breathe in slowly and as deeply as possible, raising the piston or the ball toward the top of the column.  Hold your breath for 3-5 seconds or for as long as possible. Allow the piston or ball to fall to the bottom of the column.  Remove the mouthpiece from your mouth and breathe out normally.  Rest for a few seconds and repeat Steps 1 through 7 at least 10 times every 1-2 hours when you are awake. Take your time and take a few normal breaths between deep breaths.  The spirometer may include an indicator to show your best effort. Use the indicator as a goal to work toward during each repetition.  After each set of 10 deep breaths, practice coughing to be sure your lungs are clear. If you have an incision (the cut made at the time of surgery),  support your incision when coughing by placing a pillow or rolled up towels firmly against it. Once you are able to get out of bed, walk around indoors and cough well. You may stop using the incentive spirometer when instructed by your caregiver.  RISKS AND COMPLICATIONS  Take your time so you do not get dizzy or light-headed.  If  you are in pain, you may need to take or ask for pain medication before doing incentive spirometry. It is harder to take a deep breath if you are having pain. AFTER USE  Rest and breathe slowly and easily.  It can be helpful to keep track of a log of your progress. Your caregiver can provide you with a simple table to help with this. If you are using the spirometer at home, follow these instructions: [place] IF:   You are having difficultly using the spirometer.  You have trouble using the spirometer as often as instructed.  Your pain medication is not giving enough relief while using the spirometer.  You develop fever of 100.5 F (38.1 C) or higher. SEEK IMMEDIATE MEDICAL CARE IF:   You cough up bloody sputum that had not been present before.  You develop fever of 102 F (38.9 C) or greater.  You develop worsening pain at or near the incision site. MAKE SURE YOU:   Understand these instructions.  Will watch your condition.  Will get help right away if you are not doing well or get worse. Document Released: 10/23/2006 Document Revised: 09/04/2011 Document Reviewed: 12/24/2006 Exit[employer] Patient Information 2014 Exit[employer], [place].   ________________________________________________________________________  WHAT IS A BLOOD TRANSFUSION? Blood Transfusion Information  A transfusion is the replacement of blood or some of its parts. Blood is made up of multiple cells which provide different functions.  Red blood cells carry oxygen and are used for blood loss replacement.  White blood cells fight against infection.  Platelets control bleeding.  Plasma helps clot blood.  Other blood products are available for specialized needs, such as hemophilia or other clotting disorders. BEFORE THE TRANSFUSION  Who gives blood for transfusions?   Healthy volunteers who are fully evaluated to make sure their blood is safe. This is blood bank blood. Transfusion therapy is the  safest it has ever been in the practice of medicine. Before blood is taken from a donor, a complete history is taken to make sure that person has no history of diseases nor engages in risky social behavior (examples are intravenous drug use or sexual activity with multiple partners). The donor's travel history is screened to minimize risk of transmitting infections, such as malaria. The donated blood is tested for signs of infectious diseases, such as HIV and hepatitis. The blood is then tested to be sure it is compatible with you in order to minimize the chance of a transfusion reaction. If you or a relative donates blood, this is often done in anticipation of surgery and is not appropriate for emergency situations. It takes many days to process the donated blood. RISKS AND COMPLICATIONS Although transfusion therapy is very safe and saves many lives, the main dangers of transfusion include:   Getting an infectious disease.  Developing a transfusion reaction. This is an allergic reaction to something in the blood you were given. Every precaution is taken to prevent this. The decision to have a blood transfusion has been considered carefully by your caregiver before blood is given. Blood is not given unless the benefits outweigh the risks. AFTER THE TRANSFUSION  Right after receiving a blood transfusion, you will usually feel much better and more energetic. This is especially true if your red blood cells have gotten low (anemic). The transfusion raises the level of the red blood cells which carry oxygen, and this usually causes an energy increase.  The nurse administering the transfusion will monitor you carefully for complications. HOME CARE INSTRUCTIONS  No special instructions are needed after a transfusion. You may find your energy is better. Speak with your caregiver about any limitations on activity for underlying diseases you may have. SEEK MEDICAL CARE IF:   Your condition is not improving  after your transfusion.  You develop redness or irritation at the intravenous (IV) site. SEEK IMMEDIATE MEDICAL CARE IF:  Any of the following symptoms occur over the next 12 hours:  Shaking chills.  You have a temperature by mouth above 102 F (38.9 C), not controlled by medicine.  Chest, back, or muscle pain.  People around you feel you are not acting correctly or are confused.  Shortness of breath or difficulty breathing.  Dizziness and fainting.  You get a rash or develop hives.  You have a decrease in urine output.  Your urine turns a dark color or changes to pink, red, or brown. Any of the following symptoms occur over the next 10 days:  You have a temperature by mouth above 102 F (38.9 C), not controlled by medicine.  Shortness of breath.  Weakness after normal activity.  The white part of the eye turns yellow (jaundice).  You have a decrease in the amount of urine or are urinating less often.  Your urine turns a dark color or changes to pink, red, or brown. Document Released: 06/09/2000 Document Revised: 09/04/2011 Document Reviewed: 01/27/2008 [facility] Patient Information 2014 [place], [place].  _______________________________________________________________________

## 2015-07-30 ENCOUNTER — Other Ambulatory Visit (INDEPENDENT_AMBULATORY_CARE_PROVIDER_SITE_OTHER): Payer: Medicare Other

## 2015-07-30 ENCOUNTER — Ambulatory Visit (INDEPENDENT_AMBULATORY_CARE_PROVIDER_SITE_OTHER): Payer: Medicare Other | Admitting: Cardiology

## 2015-07-30 ENCOUNTER — Encounter: Payer: Self-pay | Admitting: Cardiology

## 2015-07-30 VITALS — BP 138/60 | HR 50 | Ht 71.0 in | Wt 203.8 lb

## 2015-07-30 DIAGNOSIS — Z7901 Long term (current) use of anticoagulants: Secondary | ICD-10-CM

## 2015-07-30 DIAGNOSIS — I428 Other cardiomyopathies: Secondary | ICD-10-CM

## 2015-07-30 DIAGNOSIS — I429 Cardiomyopathy, unspecified: Secondary | ICD-10-CM | POA: Diagnosis not present

## 2015-07-30 DIAGNOSIS — E039 Hypothyroidism, unspecified: Secondary | ICD-10-CM | POA: Diagnosis not present

## 2015-07-30 DIAGNOSIS — I480 Paroxysmal atrial fibrillation: Secondary | ICD-10-CM | POA: Diagnosis not present

## 2015-07-30 LAB — TSH: TSH: 5.202 u[IU]/mL — AB (ref 0.350–4.500)

## 2015-07-30 LAB — T4, FREE: Free T4: 1.3 ng/dL (ref 0.80–1.80)

## 2015-07-30 MED ORDER — METOLAZONE 5 MG PO TABS: 5.0000 mg | ORAL_TABLET | Freq: Every day | ORAL | Status: DC | PRN

## 2015-07-30 NOTE — Patient Instructions (Addendum)
Medication Instructions:  Your physician recommends that you continue on your current medications as directed. Please refer to the Current Medication list given to you today.  Labwork: tsh/ft4  Testing/Procedures: none  Follow-Up: Your physician recommends that you schedule a follow-up appointment in: April ov/ekg with Dr Jens Som  Any Other Special Instructions Will Be Listed Below (If Applicable). Last dose of [name] on 08/08/15  If you need a refill on your cardiac medications before your next appointment, please call your pharmacy.

## 2015-07-30 NOTE — Progress Notes (Signed)
Cardiology Office Note   Date:  07/30/2015   ID:  Charles, [name] April 08, 1941, [name]  PCP:  [name], MD  Cardiologist: [name] MD  Chief Complaint  Patient presents with  . Hypertension  . Atrial Fibrillation      History of Present Illness: Charles [name] is a 75 y.o. male who presents for scheduled follow-up office visit.   Charles [name] is a 75 y.o. male who presents for 2 month follow-up office visit. He has a past history of paroxysmal atrial fibrillation and tachycardia mediated cardiomyopathy.He underwent mapping and ablation of atrial fib in 11/2012 by Dr. [name]. He had early recurrence of atrial fib in 12/2012 requiring repeat DCCV. Dr. [name] restarted amiodarone 02/2013 to try and achieve and maintain NSR to allow the heart to remodel. In 09/2013 amiodarone was decreased and he was changed to [name] (edoxaban). Amiodarone was stopped in 01/2014 due to maintenance of NSR.   However, he was seen on 12/22/14 at which time he came in because of a work in visit for recurrent atrial fibrillation. He was restarted on amiodarone 200 mg twice a day. Subsequently converted back to normal sinus rhythm on amiodarone.  rhythm. His heart rate dropped to the 50s when he returned to normal sinus rhythm. . The patient has developed some increased tremors of his hands from the amiodarone. He also had this side effect when amiodarone was used previously. Tremor has improved since we cut back on his amiodarone dose to just 100 mg daily The patient has had some exacerbation of his chronic back pain.He also is anticipating a left hip replacement by Dr. Darrelyn Hillock on February 16. He will take his last dose of [name] Sunday, February 12.  He is in normal rhythm Last visit he has had some increased fluid retention and has taken occasional dose of metolazone 5 mg intermittently as a booster.   Past Medical History  Diagnosis Date  . Paroxysmal atrial  fibrillation (HCC)     a. H/o difficult to control. b. s/p PVI ablation 12-03-2012 by [name] with recurrence afterwards, requiring period of amiodarone.  . Peripheral edema   . Mitral regurgitation     a. Mild by echo in 01/2014.  Marland Kitchen Atrial dilatation     mild biatrial dilitation  . Nonischemic cardiomyopathy (HCC)     a. presumed to be tachycardia mediated. Varying EFs over the years from 35-40% to normal and back.  . Hypertension   . Hypercholesterolemia   . CKD (chronic kidney disease), stage III   . GERD (gastroesophageal reflux disease)   . H/O hiatal hernia   . Arthritis   . Tear of medial meniscus of left knee   . Bruises easily   . Hypothyroidism   . Sleep apnea     USES C PAP  . Hyperglycemia   . Tremor   . CHF (congestive heart failure) (HCC)   . Dysrhythmia     atrial fibrillation  . Coronary artery disease     Past Surgical History  Procedure Laterality Date  . Lithotripsy      x 2  . Joint replacement  2005    total right hip replacement  . Hernia repair  03/2005  . Kidney stent  1987, 02/2005  . Quadrec  1999    LFT leg quad sx  . Cardioversion N/A 09/04/2012    Procedure: CARDIOVERSION;  Surgeon: [name], MD;  Location: [facility] ENDOSCOPY;  Service: Cardiovascular;  Laterality: N/A;  .  Tee without cardioversion N/A 12/02/2012    Procedure: TRANSESOPHAGEAL ECHOCARDIOGRAM (TEE);  Surgeon: [name], MD;  Location: [facility] ENDOSCOPY;  Service: Cardiovascular;  Laterality: N/A;  . Cardioversion N/A 02/11/2013    Procedure: CARDIOVERSION;  Surgeon: [name], MD;  Location: [facility] ENDOSCOPY;  Service: Cardiovascular;  Laterality: N/A;  . Knee arthroscopy with medial menisectomy Left 11/20/2013    Procedure: LEFT KNEE ARTHROSCOPY WITH MEDIAL MENISECTOMY;  Surgeon: Jacki Cones, MD;  Location: [facility] ORS;  Service: Orthopedics;  Laterality: Left;  Microfracture of medial femoral chondyle and abrasion chondroplasty of the medial femoral chondyle  . Knee arthroscopy  with drilling/microfracture Left 11/20/2013    Procedure: KNEE ARTHROSCOPY WITH DRILLING/MICROFRACTURE;  Surgeon: Jacki Cones, MD;  Location: [facility] ORS;  Service: Orthopedics;  Laterality: Left;  . Atrial fibrillation ablation N/A 12/03/2012    Procedure: ATRIAL FIBRILLATION ABLATION;  Surgeon: Hillis Range, MD;  Location: [facility] CATH LAB;  Service: Cardiovascular;  Laterality: N/A;  . Right hip surgery  2007    irrigation and debridement from bacteria  . Cardiac catheterization    . Atrial fibrillation ablation      PVI by [name]  . Eye surgery  [phone]    bilateral cataract surgery with lens implants  . Picc line placement  2007  . Kidney stone surgery      2 stents placed     Current Outpatient Prescriptions  Medication Sig Dispense Refill  . amiodarone (PACERONE) 200 MG tablet Take 100 mg by mouth every morning.     . Ascorbic Acid (VITAMIN C) 500 MG tablet Take 500-1,000 mg by mouth 2 (two) times daily. Pt takes two tablets in the morning and one tablet in the evening.    . Cholecalciferol (VITAMIN D) 2000 UNITS CAPS Take 4,000 Units by mouth every morning.     . Coenzyme Q10 (COQ10 PO) Take 100 mg by mouth every morning.     . enalapril (VASOTEC) 20 MG tablet Take 0.5 tablets (10 mg total) by mouth every evening. 45 tablet 3  . furosemide (LASIX) 40 MG tablet Take 60 mg by mouth every morning.    Marland Kitchen guaiFENesin (MUCINEX) 600 MG 12 hr tablet Take 600 mg by mouth 2 (two) times daily as needed for congestion.    Marland Kitchen levothyroxine (SYNTHROID, LEVOTHROID) 50 MCG tablet Take 50 mcg by mouth daily before breakfast.    . magnesium oxide (MAG-OX) 400 MG tablet Take 400 mg by mouth daily at 12 noon.    . meloxicam (MOBIC) 7.5 MG tablet Take 7.5 mg by mouth every other day.    . metolazone (ZAROXOLYN) 5 MG tablet Take 1 tablet (5 mg total) by mouth daily as needed (fluid retention). 15 tablet 1  . metoprolol succinate (TOPROL-XL) 50 MG 24 hr tablet Take 25-50 mg by mouth 2 (two) times daily.  Take 1 tablet in the morning and 1/2 tablet in the evening    . Misc Natural Products (OSTEO BI-FLEX JOINT SHIELD PO) Take 2 tablets by mouth every morning.     . Multiple Vitamin (MULTIVITAMIN) tablet Take 1 tablet by mouth every morning.     . Omega-3 Fatty Acids (FISH OIL) 1000 MG CAPS Take 1 capsule by mouth 2 (two) times daily.     Marland Kitchen omeprazole (PRILOSEC) 20 MG capsule Take 20 mg by mouth daily with lunch.    . polyvinyl alcohol (LIQUIFILM TEARS) 1.4 % ophthalmic solution Place 1 drop into both eyes daily as needed for dry eyes.    Marland Kitchen  [name] 60 MG TABS tablet TAKE 1 TAB BY MOUTH EVERY EVENING. 90 tablet 2  . zinc gluconate 50 MG tablet Take 50 mg by mouth every morning.      No current facility-administered medications for this visit.    Allergies:   Ceftriaxone sodium; Diltiazem hcl; and Penicillins    Social History:  The patient  reports that he has never smoked. He has never used smokeless tobacco. He reports that he does not drink alcohol or use illicit drugs.   Family History:  The patient's family history includes Cancer (age of onset: 69) in his mother; Heart disease (age of onset: 49) in his father; Lupus in his brother.    ROS:  Please see the history of present illness.   Otherwise, review of systems are positive for none.   All other systems are reviewed and negative.    PHYSICAL EXAM: VS:  BP 138/60 mmHg  Pulse 50  Ht  (1.803 m)  Wt 203 lb 12.8 oz (92.443 kg)  BMI 28.44 kg/m2 , BMI Body mass index is 28.44 kg/(m^2). GEN: Well nourished, well developed, in no acute distress HEENT: normal Neck: no JVD, carotid bruits, or masses Cardiac: RRR; no murmurs, rubs, or gallops,no edema  Respiratory:  clear to auscultation bilaterally, normal work of breathing GI: soft, nontender, nondistended, + BS MS: no deformity or atrophy Skin: warm and dry, no rash Neuro:  Strength and sensation are intact Psych: euthymic mood, full affect   EKG:  EKG is ordered  today. The ekg ordered today demonstrates sinus bradycardia at 51 bpm.  Normal PR interval.   Recent Labs: 05/05/2015: Magnesium 2.0; TSH 4.349 07/29/2015: ALT 17; BUN 48*; Creatinine, Ser 1.94*; Hemoglobin 12.2*; Platelets 230; Potassium 4.8; Sodium 140    Lipid Panel    Component Value Date/Time   CHOL 180 05/05/2015 1023   TRIG 248* 05/05/2015 1023   HDL 33* 05/05/2015 1023   CHOLHDL 5.5* 05/05/2015 1023   VLDL 50* 05/05/2015 1023   LDLCALC 97 05/05/2015 1023   LDLDIRECT 95.3 10/24/2011 1022      Wt Readings from Last 3 Encounters:  07/30/15 203 lb 12.8 oz (92.443 kg)  07/29/15 204 lb 9.6 oz (92.806 kg)  05/03/15 199 lb 9.6 oz (90.538 kg)       ASSESSMENT AND PLAN:  1. Paroxysmal atrial fibrillation, now back in normal sinus rhythm on amiodarone 2. Nonischemic cardiomyopathy 3. Abnormal chest x-ray with calcified pleural plaque secondary to prior asbestos exposure 4. Hypothyroidism 5. Long-term use of anticoagulant. 6.  Osteoarthritis of left hip.  Scheduled for left hip replacement on February 16.    Current medicines are reviewed at length with the patient today.  The patient does not have concerns regarding medicines.  The following changes have been made:  no change  Labs/ tests ordered today include:   Orders Placed This Encounter  Procedures  . TSH  . T4, free  . EKG 12-Lead     Disposition: He will take his last dose of [name] on Sunday, February 12 prior to his surgery on November 16.  He will restart his anticoagulation when okay with Dr. Darrelyn Hillock. Continue other medicines the same.  We are checking thyroid function today.  CBC and liver function are stable.  He will return in early April for office visit and EKG with Dr. Jens Som at [facility], [name] MD 07/30/2015 10:52 AM    [facility] [address] [place], [place], [place]  [address] Phone: [phone]; Fax: [phone]

## 2015-08-02 ENCOUNTER — Other Ambulatory Visit: Payer: Self-pay | Admitting: *Deleted

## 2015-08-02 MED ORDER — METOLAZONE 5 MG PO TABS: 5.0000 mg | ORAL_TABLET | Freq: Every day | ORAL | Status: DC | PRN

## 2015-08-02 NOTE — Progress Notes (Signed)
From Fax sent 07/30/2015-Pre-operative clearance note from Dr. [name] on chart.

## 2015-08-04 ENCOUNTER — Other Ambulatory Visit: Payer: Medicare Other

## 2015-08-04 ENCOUNTER — Ambulatory Visit: Payer: Medicare Other | Admitting: Cardiology

## 2015-08-11 NOTE — H&P (Signed)
TOTAL HIP ADMISSION H&P  Patient is admitted for left total hip arthroplasty.  Subjective:  Chief Complaint: left hip pain  HPI: Charles [name], 75 y.o. male, has a history of pain and functional disability in the left hip(s) due to arthritis and patient has failed non-surgical conservative treatments for greater than 12 weeks to include NSAID's and/or analgesics, flexibility and strengthening excercises, use of assistive devices and activity modification.  Onset of symptoms was gradual starting 3 years ago with gradually worsening course since that time.The patient noted no past surgery on the left hip(s).  Patient currently rates pain in the left hip at 8 out of 10 with activity. Patient has night pain, worsening of pain with activity and weight bearing, pain that interfers with activities of daily living and pain with passive range of motion. Patient has evidence of subchondral cysts, periarticular osteophytes and joint space narrowing by imaging studies. This condition presents safety issues increasing the risk of falls. There is no current active infection.  Patient Active Problem List   Diagnosis Date Noted  . Hyperglycemia   . CKD (chronic kidney disease), stage III   . Osteoarthritis of left knee 11/20/2013  . Complex tear of medial meniscus of left knee as current injury 11/20/2013  . Encounter for therapeutic drug monitoring 09/15/2013  . Complex sleep apnea syndrome 11/21/2012  . Nonischemic cardiomyopathy (HCC) 11/09/2012  . Snoring 11/09/2012  . Hypothyroidism 08/26/2012  . Long term (current) use of anticoagulants 08/01/2012  . Tremor 10/24/2011  . Atrial fibrillation (HCC)   . Peripheral edema   . Swelling   . Mitral regurgitation   . Orthopnea   . Dyspnea   . CHF (congestive heart failure) (HCC)   . Hypertension   . Hypercholesterolemia   . Chronic renal insufficiency   . HYPERTENSION 11/07/2006  . GERD 11/07/2006  . ABDOMINAL WALL HERNIA 11/07/2006  .  OSTEOARTHROSIS NOS, UNSPECIFIED SITE 11/07/2006  . ABRASION, LOWER EXTREMITY W/INFECTION 11/07/2006  . NEPHROLITHIASIS, HX OF 11/07/2006  . HIP REPLACEMENT, TOTAL, HX OF 11/07/2006   Past Medical History  Diagnosis Date  . Paroxysmal atrial fibrillation (HCC)     a. H/o difficult to control. b. s/p PVI ablation 12-03-2012 by [name] with recurrence afterwards, requiring period of amiodarone.  . Peripheral edema   . Mitral regurgitation     a. Mild by echo in 01/2014.  Marland Kitchen Atrial dilatation     mild biatrial dilitation  . Nonischemic cardiomyopathy (HCC)     a. presumed to be tachycardia mediated. Varying EFs over the years from 35-40% to normal and back.  . Hypertension   . Hypercholesterolemia   . CKD (chronic kidney disease), stage III   . GERD (gastroesophageal reflux disease)   . H/O hiatal hernia   . Arthritis   . Tear of medial meniscus of left knee   . Bruises easily   . Hypothyroidism   . Sleep apnea     USES C PAP  . Hyperglycemia   . Tremor   . CHF (congestive heart failure) (HCC)   . Dysrhythmia     atrial fibrillation  . Coronary artery disease     Past Surgical History  Procedure Laterality Date  . Lithotripsy      x 2  . Joint replacement  2005    total right hip replacement  . Hernia repair  03/2005  . Kidney stent  1987, 02/2005  . Quadrec  1999    LFT leg quad sx  .  Cardioversion N/A 09/04/2012    Procedure: CARDIOVERSION;  Surgeon: [name], MD;  Location: [facility] ENDOSCOPY;  Service: Cardiovascular;  Laterality: N/A;  . Tee without cardioversion N/A 12/02/2012    Procedure: TRANSESOPHAGEAL ECHOCARDIOGRAM (TEE);  Surgeon: [name], MD;  Location: [facility] ENDOSCOPY;  Service: Cardiovascular;  Laterality: N/A;  . Cardioversion N/A 02/11/2013    Procedure: CARDIOVERSION;  Surgeon: [name], MD;  Location: [facility] ENDOSCOPY;  Service: Cardiovascular;  Laterality: N/A;  . Knee arthroscopy with medial menisectomy Left 11/20/2013    Procedure: LEFT KNEE  ARTHROSCOPY WITH MEDIAL MENISECTOMY;  Surgeon: Jacki Cones, MD;  Location: [facility] ORS;  Service: Orthopedics;  Laterality: Left;  Microfracture of medial femoral chondyle and abrasion chondroplasty of the medial femoral chondyle  . Knee arthroscopy with drilling/microfracture Left 11/20/2013    Procedure: KNEE ARTHROSCOPY WITH DRILLING/MICROFRACTURE;  Surgeon: Jacki Cones, MD;  Location: [facility] ORS;  Service: Orthopedics;  Laterality: Left;  . Atrial fibrillation ablation N/A 12/03/2012    Procedure: ATRIAL FIBRILLATION ABLATION;  Surgeon: Hillis Range, MD;  Location: [facility] CATH LAB;  Service: Cardiovascular;  Laterality: N/A;  . Right hip surgery  2007    irrigation and debridement from bacteria  . Cardiac catheterization    . Atrial fibrillation ablation      PVI by [name]  . Eye surgery  [phone]    bilateral cataract surgery with lens implants  . Picc line placement  2007  . Kidney stone surgery      2 stents placed      Current outpatient prescriptions:  .  amiodarone (PACERONE) 200 MG tablet, Take 100 mg by mouth every morning. , Disp: , Rfl:  .  Ascorbic Acid (VITAMIN C) 500 MG tablet, Take 500-1,000 mg by mouth 2 (two) times daily. Pt takes two tablets in the morning and one tablet in the evening., Disp: , Rfl:  .  Cholecalciferol (VITAMIN D) 2000 UNITS CAPS, Take 4,000 Units by mouth every morning. , Disp: , Rfl:  .  Coenzyme Q10 (COQ10 PO), Take 100 mg by mouth every morning. , Disp: , Rfl:  .  enalapril (VASOTEC) 20 MG tablet, Take 0.5 tablets (10 mg total) by mouth every evening., Disp: 45 tablet, Rfl: 3 .  guaiFENesin (MUCINEX) 600 MG 12 hr tablet, Take 600 mg by mouth 2 (two) times daily as needed for congestion., Disp: , Rfl:  .  magnesium oxide (MAG-OX) 400 MG tablet, Take 400 mg by mouth daily at 12 noon., Disp: , Rfl:  .  meloxicam (MOBIC) 7.5 MG tablet, Take 7.5 mg by mouth every other day., Disp: , Rfl:  .  metoprolol succinate (TOPROL-XL) 50 MG 24 hr tablet, Take  25-50 mg by mouth 2 (two) times daily. Take 1 tablet in the morning and 1/2 tablet in the evening, Disp: , Rfl:  .  Misc Natural Products (OSTEO BI-FLEX JOINT SHIELD PO), Take 2 tablets by mouth every morning. , Disp: , Rfl:  .  Multiple Vitamin (MULTIVITAMIN) tablet, Take 1 tablet by mouth every morning. , Disp: , Rfl:  .  Omega-3 Fatty Acids (FISH OIL) 1000 MG CAPS, Take 1 capsule by mouth 2 (two) times daily. , Disp: , Rfl:  .  polyvinyl alcohol (LIQUIFILM TEARS) 1.4 % ophthalmic solution, Place 1 drop into both eyes daily as needed for dry eyes., Disp: , Rfl:  .  [name] 60 MG TABS tablet, TAKE 1 TAB BY MOUTH EVERY EVENING., Disp: 90 tablet, Rfl: 2 .  zinc gluconate 50 MG  tablet, Take 50 mg by mouth every morning. , Disp: , Rfl:  .  furosemide (LASIX) 40 MG tablet, Take 60 mg by mouth every morning., Disp: , Rfl:  .  levothyroxine (SYNTHROID, LEVOTHROID) 50 MCG tablet, Take 50 mcg by mouth daily before breakfast., Disp: , Rfl:  .  metolazone (ZAROXOLYN) 5 MG tablet, Take 1 tablet (5 mg total) by mouth daily as needed (fluid retention)., Disp: 15 tablet, Rfl: 1 .  omeprazole (PRILOSEC) 20 MG capsule, Take 20 mg by mouth daily with lunch., Disp: , Rfl:   Allergies  Allergen Reactions  . Ceftriaxone Sodium Rash    Rocephin  . Diltiazem Hcl Rash    Cardizem  . Penicillins Rash    Has patient had a PCN reaction causing immediate rash, facial/tongue/throat swelling, SOB or lightheadedness with hypotension: Yes Has patient had a PCN reaction causing severe rash involving mucus membranes or skin necrosis: Yes Has patient had a PCN reaction that required hospitalization No Has patient had a PCN reaction occurring within the last 10 years: No If all of the above answers are "NO", then may proceed with Cephalosporin use.     Social History  Substance Use Topics  . Smoking status: Never Smoker   . Smokeless tobacco: Never Used  . Alcohol Use: No    Family History  Problem Relation Age of  Onset  . Cancer Mother 30    CANCER  . Heart disease Father 87    HEART PROBLEMS  . Lupus Brother     "of the skin"     Review of Systems  Constitutional: Negative.   HENT: Negative.   Eyes: Negative.   Respiratory: Positive for shortness of breath. Negative for cough, hemoptysis, sputum production and wheezing.        SOB with exertion  Cardiovascular: Negative.   Gastrointestinal: Negative.   Genitourinary: Negative.   Musculoskeletal: Positive for myalgias, back pain and joint pain. Negative for falls and neck pain.  Skin: Negative.   Neurological: Positive for tremors. Negative for dizziness, tingling, sensory change, speech change, focal weakness, seizures and loss of consciousness.  Endo/Heme/Allergies: Negative for environmental allergies and polydipsia. Bruises/bleeds easily.  Psychiatric/Behavioral: Negative.     Objective:  Physical Exam  Constitutional: He is oriented to person, place, and time. He appears well-developed and well-nourished. No distress.  HENT:  Head: Normocephalic and atraumatic.  Right Ear: External ear normal.  Left Ear: External ear normal.  Nose: Nose normal.  Mouth/Throat: Oropharynx is clear and moist.  Eyes: Conjunctivae and EOM are normal.  Neck: Normal range of motion. Neck supple.  Cardiovascular: Normal rate, regular rhythm, normal heart sounds and intact distal pulses.   No murmur heard. Respiratory: Effort normal and breath sounds normal. No respiratory distress. He has no wheezes.  GI: Soft. Bowel sounds are normal. He exhibits no distension. There is no tenderness.  Musculoskeletal:       Right hip: Normal.       Left hip: He exhibits decreased range of motion, decreased strength and crepitus.       Right knee: Normal.       Left knee: Normal.  Neurological: He is alert and oriented to person, place, and time. He has normal strength and normal reflexes. No sensory deficit.  Skin: No rash noted. He is not diaphoretic. No  erythema.  Psychiatric: He has a normal mood and affect. His behavior is normal.    Vitals Weight: 192 lb Height: 71in Body Surface Area: 2.07 m  Body Mass Index: 26.78 kg/m  Pulse: 76 (Regular)  BP: 148/67 (Sitting, Left Arm, Standard)  Imaging Review Plain radiographs demonstrate severe degenerative joint disease of the left hip(s). The bone quality appears to be good for age and reported activity level.  Assessment/Plan:  End stage primary osteoarthritis, left hip(s)  The patient history, physical examination, clinical judgement of the provider and imaging studies are consistent with end stage degenerative joint disease of the left hip(s) and total hip arthroplasty is deemed medically necessary. The treatment options including medical management, injection therapy, arthroscopy and arthroplasty were discussed at length. The risks and benefits of total hip arthroplasty were presented and reviewed. The risks due to aseptic loosening, infection, stiffness, dislocation/subluxation,  thromboembolic complications and other imponderables were discussed.  The patient acknowledged the explanation, agreed to proceed with the plan and consent was signed. Patient is being admitted for inpatient treatment for surgery, pain control, PT, OT, prophylactic antibiotics, VTE prophylaxis, progressive ambulation and ADL's and discharge planning.The patient is planning to be discharged home with home health services      [place], [place]

## 2015-08-12 ENCOUNTER — Inpatient Hospital Stay (HOSPITAL_COMMUNITY): Payer: Medicare Other | Admitting: Certified Registered"

## 2015-08-12 ENCOUNTER — Encounter (HOSPITAL_COMMUNITY): Payer: Self-pay | Admitting: *Deleted

## 2015-08-12 ENCOUNTER — Inpatient Hospital Stay (HOSPITAL_COMMUNITY): Payer: Medicare Other

## 2015-08-12 ENCOUNTER — Inpatient Hospital Stay (HOSPITAL_COMMUNITY)
Admission: RE | Admit: 2015-08-12 | Discharge: 2015-08-15 | DRG: 470 | Disposition: A | Discharge status: Home Health | Payer: Medicare Other | Source: Ambulatory Visit | Attending: Orthopedic Surgery | Admitting: Orthopedic Surgery

## 2015-08-12 ENCOUNTER — Encounter (HOSPITAL_COMMUNITY)
Admission: RE | Disposition: A | Discharge status: Home Health | Payer: Self-pay | Source: Ambulatory Visit | Attending: Orthopedic Surgery

## 2015-08-12 DIAGNOSIS — Z96642 Presence of left artificial hip joint: Secondary | ICD-10-CM

## 2015-08-12 DIAGNOSIS — M1612 Unilateral primary osteoarthritis, left hip: Secondary | ICD-10-CM | POA: Diagnosis not present

## 2015-08-12 DIAGNOSIS — Z7901 Long term (current) use of anticoagulants: Secondary | ICD-10-CM

## 2015-08-12 DIAGNOSIS — Z01812 Encounter for preprocedural laboratory examination: Secondary | ICD-10-CM | POA: Diagnosis not present

## 2015-08-12 DIAGNOSIS — D62 Acute posthemorrhagic anemia: Secondary | ICD-10-CM | POA: Diagnosis not present

## 2015-08-12 DIAGNOSIS — Z471 Aftercare following joint replacement surgery: Secondary | ICD-10-CM | POA: Diagnosis not present

## 2015-08-12 DIAGNOSIS — I2510 Atherosclerotic heart disease of native coronary artery without angina pectoris: Secondary | ICD-10-CM | POA: Diagnosis not present

## 2015-08-12 DIAGNOSIS — M25552 Pain in left hip: Secondary | ICD-10-CM | POA: Diagnosis present

## 2015-08-12 DIAGNOSIS — K219 Gastro-esophageal reflux disease without esophagitis: Secondary | ICD-10-CM | POA: Diagnosis present

## 2015-08-12 DIAGNOSIS — E7800 Pure hypercholesterolemia, unspecified: Secondary | ICD-10-CM | POA: Diagnosis not present

## 2015-08-12 DIAGNOSIS — I130 Hypertensive heart and chronic kidney disease with heart failure and stage 1 through stage 4 chronic kidney disease, or unspecified chronic kidney disease: Secondary | ICD-10-CM | POA: Diagnosis not present

## 2015-08-12 DIAGNOSIS — N183 Chronic kidney disease, stage 3 (moderate): Secondary | ICD-10-CM | POA: Diagnosis present

## 2015-08-12 DIAGNOSIS — Z79899 Other long term (current) drug therapy: Secondary | ICD-10-CM

## 2015-08-12 DIAGNOSIS — G4730 Sleep apnea, unspecified: Secondary | ICD-10-CM | POA: Diagnosis not present

## 2015-08-12 DIAGNOSIS — I480 Paroxysmal atrial fibrillation: Secondary | ICD-10-CM | POA: Diagnosis not present

## 2015-08-12 DIAGNOSIS — I509 Heart failure, unspecified: Secondary | ICD-10-CM | POA: Diagnosis not present

## 2015-08-12 DIAGNOSIS — I429 Cardiomyopathy, unspecified: Secondary | ICD-10-CM | POA: Diagnosis present

## 2015-08-12 DIAGNOSIS — Z96641 Presence of right artificial hip joint: Secondary | ICD-10-CM | POA: Diagnosis present

## 2015-08-12 DIAGNOSIS — E039 Hypothyroidism, unspecified: Secondary | ICD-10-CM | POA: Diagnosis present

## 2015-08-12 DIAGNOSIS — K449 Diaphragmatic hernia without obstruction or gangrene: Secondary | ICD-10-CM | POA: Diagnosis present

## 2015-08-12 DIAGNOSIS — I10 Essential (primary) hypertension: Secondary | ICD-10-CM | POA: Diagnosis not present

## 2015-08-12 DIAGNOSIS — Z96649 Presence of unspecified artificial hip joint: Secondary | ICD-10-CM

## 2015-08-12 DIAGNOSIS — M169 Osteoarthritis of hip, unspecified: Secondary | ICD-10-CM | POA: Diagnosis not present

## 2015-08-12 HISTORY — PX: TOTAL HIP ARTHROPLASTY: SHX124

## 2015-08-12 LAB — ABO/RH: ABO/RH(D): A POS

## 2015-08-12 LAB — PROTIME-INR
INR: 1.22 (ref 0.00–1.49)
PROTHROMBIN TIME: 15.6 s — AB (ref 11.6–15.2)

## 2015-08-12 SURGERY — ARTHROPLASTY, HIP, TOTAL,POSTERIOR APPROACH
Anesthesia: General | Site: Hip | Laterality: Left

## 2015-08-12 MED ORDER — ROCURONIUM BROMIDE 100 MG/10ML IV SOLN
INTRAVENOUS | Status: AC
  Filled 2015-08-12: qty 1

## 2015-08-12 MED ORDER — EDOXABAN TOSYLATE 60 MG PO TABS: 60.0000 mg | ORAL_TABLET | ORAL | Status: DC

## 2015-08-12 MED ORDER — MENTHOL 3 MG MT LOZG: 1.0000 | LOZENGE | OROMUCOSAL | Status: DC | PRN

## 2015-08-12 MED ORDER — HYDROCODONE-ACETAMINOPHEN 5-325 MG PO TABS: 1.0000 | ORAL_TABLET | ORAL | Status: DC | PRN

## 2015-08-12 MED ORDER — BUPIVACAINE LIPOSOME 1.3 % IJ SUSP
INTRAMUSCULAR | Status: DC | PRN
  Administered 2015-08-12: 20 mL

## 2015-08-12 MED ORDER — ONDANSETRON HCL 4 MG PO TABS: 4.0000 mg | ORAL_TABLET | Freq: Four times a day (QID) | ORAL | Status: DC | PRN

## 2015-08-12 MED ORDER — ONDANSETRON HCL 4 MG/2ML IJ SOLN
INTRAMUSCULAR | Status: AC
  Filled 2015-08-12: qty 2

## 2015-08-12 MED ORDER — HYDROCODONE-ACETAMINOPHEN 5-325 MG PO TABS
1.0000 | ORAL_TABLET | ORAL | Status: DC | PRN
  Administered 2015-08-12 – 2015-08-13 (×3): 2 via ORAL
  Administered 2015-08-14 (×2): 1 via ORAL
  Filled 2015-08-12: qty 2
  Filled 2015-08-12: qty 1
  Filled 2015-08-12: qty 2
  Filled 2015-08-12: qty 1
  Filled 2015-08-12: qty 2

## 2015-08-12 MED ORDER — ACETAMINOPHEN 650 MG RE SUPP: 650.0000 mg | Freq: Four times a day (QID) | RECTAL | Status: DC | PRN

## 2015-08-12 MED ORDER — DEXAMETHASONE SODIUM PHOSPHATE 10 MG/ML IJ SOLN
INTRAMUSCULAR | Status: DC | PRN
  Administered 2015-08-12: 10 mg via INTRAVENOUS

## 2015-08-12 MED ORDER — GLYCOPYRROLATE 0.2 MG/ML IJ SOLN
INTRAMUSCULAR | Status: DC | PRN
  Administered 2015-08-12: .6 mg via INTRAVENOUS

## 2015-08-12 MED ORDER — FLEET ENEMA 7-19 GM/118ML RE ENEM: 1.0000 | ENEMA | Freq: Once | RECTAL | Status: DC | PRN

## 2015-08-12 MED ORDER — LABETALOL HCL 5 MG/ML IV SOLN
INTRAVENOUS | Status: AC
  Filled 2015-08-12: qty 4

## 2015-08-12 MED ORDER — MIDAZOLAM HCL 2 MG/2ML IJ SOLN
INTRAMUSCULAR | Status: DC | PRN
  Administered 2015-08-12: 2 mg via INTRAVENOUS

## 2015-08-12 MED ORDER — ALUM & MAG HYDROXIDE-SIMETH 200-200-20 MG/5ML PO SUSP: 30.0000 mL | ORAL | Status: DC | PRN

## 2015-08-12 MED ORDER — PHENYLEPHRINE 40 MCG/ML (10ML) SYRINGE FOR IV PUSH (FOR BLOOD PRESSURE SUPPORT)
PREFILLED_SYRINGE | INTRAVENOUS | Status: AC
  Filled 2015-08-12: qty 10

## 2015-08-12 MED ORDER — CHLORHEXIDINE GLUCONATE 4 % EX LIQD: 60.0000 mL | Freq: Once | CUTANEOUS | Status: DC

## 2015-08-12 MED ORDER — PHENOL 1.4 % MT LIQD
1.0000 | OROMUCOSAL | Status: DC | PRN
Start: 2015-08-12 — End: 2015-08-15
  Filled 2015-08-12: qty 177

## 2015-08-12 MED ORDER — FENTANYL CITRATE (PF) 100 MCG/2ML IJ SOLN
INTRAMUSCULAR | Status: DC | PRN
  Administered 2015-08-12 (×2): 50 ug via INTRAVENOUS
  Administered 2015-08-12: 100 ug via INTRAVENOUS

## 2015-08-12 MED ORDER — METOLAZONE 5 MG PO TABS
5.0000 mg | ORAL_TABLET | Freq: Every day | ORAL | Status: DC | PRN
  Filled 2015-08-12: qty 1

## 2015-08-12 MED ORDER — METHOCARBAMOL 500 MG PO TABS: 500.0000 mg | ORAL_TABLET | Freq: Four times a day (QID) | ORAL | Status: DC | PRN

## 2015-08-12 MED ORDER — LACTATED RINGERS IV SOLN
INTRAVENOUS | Status: DC
  Administered 2015-08-12: 22:00:00 via INTRAVENOUS

## 2015-08-12 MED ORDER — POLYVINYL ALCOHOL 1.4 % OP SOLN
1.0000 [drp] | Freq: Every day | OPHTHALMIC | Status: DC | PRN
  Filled 2015-08-12: qty 15

## 2015-08-12 MED ORDER — METHOCARBAMOL 500 MG PO TABS
500.0000 mg | ORAL_TABLET | Freq: Four times a day (QID) | ORAL | Status: DC | PRN
  Administered 2015-08-12: 500 mg via ORAL
  Filled 2015-08-12: qty 1

## 2015-08-12 MED ORDER — NEOSTIGMINE METHYLSULFATE 10 MG/10ML IV SOLN
INTRAVENOUS | Status: AC
  Filled 2015-08-12: qty 1

## 2015-08-12 MED ORDER — FENTANYL CITRATE (PF) 100 MCG/2ML IJ SOLN
INTRAMUSCULAR | Status: AC
  Filled 2015-08-12: qty 2

## 2015-08-12 MED ORDER — DEXAMETHASONE SODIUM PHOSPHATE 10 MG/ML IJ SOLN
INTRAMUSCULAR | Status: AC
  Filled 2015-08-12: qty 1

## 2015-08-12 MED ORDER — ONDANSETRON HCL 4 MG/2ML IJ SOLN: 4.0000 mg | Freq: Four times a day (QID) | INTRAMUSCULAR | Status: DC | PRN

## 2015-08-12 MED ORDER — GLYCOPYRROLATE 0.2 MG/ML IJ SOLN
INTRAMUSCULAR | Status: AC
  Filled 2015-08-12: qty 3

## 2015-08-12 MED ORDER — POLYETHYLENE GLYCOL 3350 17 G PO PACK
17.0000 g | PACK | Freq: Every day | ORAL | Status: DC | PRN
  Administered 2015-08-14: 17 g via ORAL
  Filled 2015-08-12: qty 1

## 2015-08-12 MED ORDER — ACETAMINOPHEN 325 MG PO TABS
650.0000 mg | ORAL_TABLET | Freq: Four times a day (QID) | ORAL | Status: DC | PRN
  Administered 2015-08-13 – 2015-08-15 (×2): 650 mg via ORAL
  Filled 2015-08-12 (×2): qty 2

## 2015-08-12 MED ORDER — LIDOCAINE HCL (CARDIAC) 20 MG/ML IV SOLN
INTRAVENOUS | Status: DC | PRN
  Administered 2015-08-12: 40 mg via INTRATRACHEAL

## 2015-08-12 MED ORDER — VANCOMYCIN HCL IN DEXTROSE 1-5 GM/200ML-% IV SOLN
1000.0000 mg | Freq: Two times a day (BID) | INTRAVENOUS | Status: AC
  Administered 2015-08-12: 1000 mg via INTRAVENOUS
  Filled 2015-08-12: qty 200

## 2015-08-12 MED ORDER — ONDANSETRON HCL 4 MG/2ML IJ SOLN
INTRAMUSCULAR | Status: DC | PRN
  Administered 2015-08-12: 4 mg via INTRAVENOUS

## 2015-08-12 MED ORDER — THROMBIN 5000 UNITS EX SOLR
CUTANEOUS | Status: AC
  Filled 2015-08-12: qty 5000

## 2015-08-12 MED ORDER — OXYCODONE-ACETAMINOPHEN 5-325 MG PO TABS: 2.0000 | ORAL_TABLET | ORAL | Status: DC | PRN

## 2015-08-12 MED ORDER — SODIUM CHLORIDE 0.9 % IR SOLN
Status: AC
  Filled 2015-08-12: qty 1

## 2015-08-12 MED ORDER — LACTATED RINGERS IV SOLN
INTRAVENOUS | Status: DC
  Administered 2015-08-12 (×2): via INTRAVENOUS

## 2015-08-12 MED ORDER — KETAMINE HCL 10 MG/ML IJ SOLN
INTRAMUSCULAR | Status: AC
  Filled 2015-08-12: qty 1

## 2015-08-12 MED ORDER — FUROSEMIDE 40 MG PO TABS
60.0000 mg | ORAL_TABLET | Freq: Every morning | ORAL | Status: DC
  Administered 2015-08-12 – 2015-08-15 (×4): 60 mg via ORAL
  Filled 2015-08-12 (×4): qty 1

## 2015-08-12 MED ORDER — HYDROMORPHONE HCL 1 MG/ML IJ SOLN
1.0000 mg | INTRAMUSCULAR | Status: DC | PRN
  Administered 2015-08-12: 1 mg via INTRAVENOUS
  Filled 2015-08-12: qty 1

## 2015-08-12 MED ORDER — RIVAROXABAN 10 MG PO TABS
10.0000 mg | ORAL_TABLET | Freq: Every day | ORAL | Status: DC
  Filled 2015-08-12: qty 1

## 2015-08-12 MED ORDER — PROPOFOL 10 MG/ML IV BOLUS
INTRAVENOUS | Status: AC
  Filled 2015-08-12: qty 80

## 2015-08-12 MED ORDER — BUPIVACAINE LIPOSOME 1.3 % IJ SUSP
20.0000 mL | Freq: Once | INTRAMUSCULAR | Status: DC
  Filled 2015-08-12: qty 20

## 2015-08-12 MED ORDER — NEOSTIGMINE METHYLSULFATE 10 MG/10ML IV SOLN
INTRAVENOUS | Status: DC | PRN
  Administered 2015-08-12: 4 mg via INTRAVENOUS

## 2015-08-12 MED ORDER — METHOCARBAMOL 1000 MG/10ML IJ SOLN
500.0000 mg | Freq: Four times a day (QID) | INTRAVENOUS | Status: DC | PRN
  Administered 2015-08-12: 500 mg via INTRAVENOUS
  Filled 2015-08-12 (×2): qty 5

## 2015-08-12 MED ORDER — SODIUM CHLORIDE 0.9 % IR SOLN
Status: DC | PRN
  Administered 2015-08-12: 500 mL

## 2015-08-12 MED ORDER — PROPOFOL 10 MG/ML IV BOLUS
INTRAVENOUS | Status: DC | PRN
  Administered 2015-08-12: 50 mg via INTRAVENOUS
  Administered 2015-08-12: 150 mg via INTRAVENOUS

## 2015-08-12 MED ORDER — THROMBIN 5000 UNITS EX SOLR
CUTANEOUS | Status: DC | PRN
  Administered 2015-08-12: 5000 [IU] via TOPICAL

## 2015-08-12 MED ORDER — METOCLOPRAMIDE HCL 10 MG PO TABS: 5.0000 mg | ORAL_TABLET | Freq: Three times a day (TID) | ORAL | Status: DC | PRN

## 2015-08-12 MED ORDER — VANCOMYCIN HCL IN DEXTROSE 1-5 GM/200ML-% IV SOLN
1000.0000 mg | INTRAVENOUS | Status: AC
  Administered 2015-08-12: 1000 mg via INTRAVENOUS
  Filled 2015-08-12: qty 200

## 2015-08-12 MED ORDER — LIDOCAINE HCL (CARDIAC) 20 MG/ML IV SOLN
INTRAVENOUS | Status: AC
  Filled 2015-08-12: qty 5

## 2015-08-12 MED ORDER — FERROUS SULFATE 325 (65 FE) MG PO TABS
325.0000 mg | ORAL_TABLET | Freq: Three times a day (TID) | ORAL | Status: DC
Start: 2015-08-12 — End: 2015-08-15
  Administered 2015-08-13 – 2015-08-15 (×7): 325 mg via ORAL
  Filled 2015-08-12 (×11): qty 1

## 2015-08-12 MED ORDER — METOPROLOL SUCCINATE ER 25 MG PO TB24
50.0000 mg | ORAL_TABLET | Freq: Every day | ORAL | Status: DC
  Administered 2015-08-13 – 2015-08-15 (×3): 50 mg via ORAL
  Filled 2015-08-12 (×4): qty 2

## 2015-08-12 MED ORDER — BISACODYL 5 MG PO TBEC: 5.0000 mg | DELAYED_RELEASE_TABLET | Freq: Every day | ORAL | Status: DC | PRN

## 2015-08-12 MED ORDER — METOCLOPRAMIDE HCL 5 MG/ML IJ SOLN: 5.0000 mg | Freq: Three times a day (TID) | INTRAMUSCULAR | Status: DC | PRN

## 2015-08-12 MED ORDER — PANTOPRAZOLE SODIUM 40 MG PO TBEC
40.0000 mg | DELAYED_RELEASE_TABLET | Freq: Every day | ORAL | Status: DC
  Administered 2015-08-12 – 2015-08-15 (×4): 40 mg via ORAL
  Filled 2015-08-12 (×4): qty 1

## 2015-08-12 MED ORDER — MIDAZOLAM HCL 2 MG/2ML IJ SOLN
INTRAMUSCULAR | Status: AC
  Filled 2015-08-12: qty 2

## 2015-08-12 MED ORDER — ENALAPRIL MALEATE 10 MG PO TABS
10.0000 mg | ORAL_TABLET | Freq: Every evening | ORAL | Status: DC
  Filled 2015-08-12 (×4): qty 1

## 2015-08-12 MED ORDER — METOPROLOL SUCCINATE ER 25 MG PO TB24
25.0000 mg | ORAL_TABLET | Freq: Every evening | ORAL | Status: DC
  Administered 2015-08-13 – 2015-08-14 (×2): 25 mg via ORAL
  Filled 2015-08-12 (×4): qty 1

## 2015-08-12 MED ORDER — LEVOTHYROXINE SODIUM 50 MCG PO TABS
50.0000 ug | ORAL_TABLET | Freq: Every day | ORAL | Status: DC
  Administered 2015-08-13 – 2015-08-15 (×3): 50 ug via ORAL
  Filled 2015-08-12 (×4): qty 1

## 2015-08-12 MED ORDER — AMIODARONE HCL 100 MG PO TABS
100.0000 mg | ORAL_TABLET | Freq: Every morning | ORAL | Status: DC
  Administered 2015-08-13 – 2015-08-15 (×3): 100 mg via ORAL
  Filled 2015-08-12 (×3): qty 1

## 2015-08-12 MED ORDER — ROCURONIUM BROMIDE 100 MG/10ML IV SOLN
INTRAVENOUS | Status: DC | PRN
  Administered 2015-08-12: 50 mg via INTRAVENOUS
  Administered 2015-08-12 (×2): 10 mg via INTRAVENOUS

## 2015-08-12 SURGICAL SUPPLY — 61 items
BAG ZIPLOCK 12X15 (MISCELLANEOUS) IMPLANT
BLADE SAW SAG 73X25 THK (BLADE) ×1
BLADE SAW SGTL 73X25 THK (BLADE) ×1 IMPLANT
CAPT HIP TOTAL 2 ×2 IMPLANT
DRAPE INCISE IOBAN 85X60 (DRAPES) ×2 IMPLANT
DRAPE ORTHO SPLIT 77X108 STRL (DRAPES) ×2
DRAPE POUCH INSTRU U-SHP 10X18 (DRAPES) ×2 IMPLANT
DRAPE SURG 17X11 SM STRL (DRAPES) ×2 IMPLANT
DRAPE SURG ORHT 6 SPLT 77X108 (DRAPES) ×2 IMPLANT
DRAPE U-SHAPE 47X51 STRL (DRAPES) ×2 IMPLANT
DRSG ADAPTIC 3X8 NADH LF (GAUZE/BANDAGES/DRESSINGS) IMPLANT
DRSG AQUACEL AG ADV 3.5X10 (GAUZE/BANDAGES/DRESSINGS) ×2 IMPLANT
DRSG AQUACEL AG ADV 3.5X14 (GAUZE/BANDAGES/DRESSINGS) IMPLANT
DRSG TEGADERM 4X4.75 (GAUZE/BANDAGES/DRESSINGS) IMPLANT
DURAPREP 26ML APPLICATOR (WOUND CARE) ×2 IMPLANT
ELECT BLADE TIP CTD 4 INCH (ELECTRODE) ×2 IMPLANT
ELECT REM PT RETURN 9FT ADLT (ELECTROSURGICAL) ×2
ELECTRODE REM PT RTRN 9FT ADLT (ELECTROSURGICAL) ×1 IMPLANT
EVACUATOR 1/8 PVC DRAIN (DRAIN) IMPLANT
FACESHIELD WRAPAROUND (MASK) ×8 IMPLANT
GAUZE SPONGE 2X2 8PLY STRL LF (GAUZE/BANDAGES/DRESSINGS) IMPLANT
GAUZE SPONGE 4X4 12PLY STRL (GAUZE/BANDAGES/DRESSINGS) IMPLANT
GLOVE BIOGEL PI IND STRL 6.5 (GLOVE) ×1 IMPLANT
GLOVE BIOGEL PI IND STRL 7.5 (GLOVE) ×1 IMPLANT
GLOVE BIOGEL PI IND STRL 8 (GLOVE) ×1 IMPLANT
GLOVE BIOGEL PI INDICATOR 6.5 (GLOVE) ×1
GLOVE BIOGEL PI INDICATOR 7.5 (GLOVE) ×1
GLOVE BIOGEL PI INDICATOR 8 (GLOVE) ×1
GLOVE ECLIPSE 8.0 STRL XLNG CF (GLOVE) ×4 IMPLANT
GLOVE ORTHO TXT STRL SZ7.5 (GLOVE) ×2 IMPLANT
GLOVE SURG SS PI 6.5 STRL IVOR (GLOVE) IMPLANT
GOWN STRL REUS W/TWL LRG LVL3 (GOWN DISPOSABLE) ×4 IMPLANT
GOWN STRL REUS W/TWL XL LVL3 (GOWN DISPOSABLE) ×4 IMPLANT
IMMOBILIZER KNEE 20 (SOFTGOODS) ×2
IMMOBILIZER KNEE 20 THIGH 36 (SOFTGOODS) ×1 IMPLANT
LIQUID BAND (GAUZE/BANDAGES/DRESSINGS) ×2 IMPLANT
MANIFOLD NEPTUNE II (INSTRUMENTS) ×2 IMPLANT
MARKER SKIN DUAL TIP RULER LAB (MISCELLANEOUS) ×2 IMPLANT
NDL SAFETY ECLIPSE 18X1.5 (NEEDLE) ×1 IMPLANT
NEEDLE HYPO 18GX1.5 SHARP (NEEDLE) ×1
POSITIONER SURGICAL ARM (MISCELLANEOUS) ×2 IMPLANT
SCREW 6.5MMX30MM (Screw) IMPLANT
SPONGE GAUZE 2X2 STER 10/PKG (GAUZE/BANDAGES/DRESSINGS)
SPONGE LAP 18X18 X RAY DECT (DISPOSABLE) IMPLANT
SPONGE LAP 4X18 X RAY DECT (DISPOSABLE) ×2 IMPLANT
SPONGE SURGIFOAM ABS GEL 100 (HEMOSTASIS) ×2 IMPLANT
STAPLER VISISTAT 35W (STAPLE) IMPLANT
SUCTION FRAZIER HANDLE 10FR (MISCELLANEOUS) ×1
SUCTION TUBE FRAZIER 10FR DISP (MISCELLANEOUS) ×1 IMPLANT
SUT MNCRL AB 4-0 PS2 18 (SUTURE) IMPLANT
SUT VIC AB 1 CT1 27 (SUTURE) ×1
SUT VIC AB 1 CT1 27XBRD ANTBC (SUTURE) ×1 IMPLANT
SUT VIC AB 2-0 CT1 27 (SUTURE) ×3
SUT VIC AB 2-0 CT1 TAPERPNT 27 (SUTURE) ×3 IMPLANT
SUT VLOC 180 0 24IN GS25 (SUTURE) ×2 IMPLANT
SYR 20CC LL (SYRINGE) ×2 IMPLANT
TOWEL OR 17X26 10 PK STRL BLUE (TOWEL DISPOSABLE) ×4 IMPLANT
TRAY FOLEY W/METER SILVER 14FR (SET/KITS/TRAYS/PACK) ×2 IMPLANT
TRAY FOLEY W/METER SILVER 16FR (SET/KITS/TRAYS/PACK) ×2 IMPLANT
WATER STERILE IRR 1500ML POUR (IV SOLUTION) ×2 IMPLANT
YANKAUER SUCT BULB TIP 10FT TU (MISCELLANEOUS) ×2 IMPLANT

## 2015-08-12 NOTE — Progress Notes (Signed)
Utilization review completed.  

## 2015-08-12 NOTE — Evaluation (Signed)
Physical Therapy Evaluation Patient Details Name: Charles [name] MRN: [id number] DOB: 06/16/1941 Today's Date: 08/12/2015   History of Present Illness  L THR; Hx of R THR (~05), CHF, CAD, tremor and peripheral edema  Clinical Impression  Pt s/p L THR presents with decreased L LE strength/ROM, post op pain, PWB status on L LE and posterior THP limiting functional mobility.  Pt hopes to progress to dc home with family assist and HHPT follow up.    Follow Up Recommendations Home health PT    Equipment Recommendations  None recommended by PT    Recommendations for Other Services OT consult     Precautions / Restrictions Precautions Precautions: Posterior Hip;Fall Precaution Booklet Issued: Yes (comment) Precaution Comments: All post THP reviewed and sign hung in room Restrictions Weight Bearing Restrictions: Yes LLE Weight Bearing: Partial weight bearing LLE Partial Weight Bearing Percentage or Pounds: 50%      Mobility  Bed Mobility Overal bed mobility: Needs Assistance;+2 for physical assistance Bed Mobility: Supine to Sit     Supine to sit: Min assist;Mod assist;+2 for physical assistance;+2 for safety/equipment     General bed mobility comments: cues for sequence and use of R LE to self assist  Transfers Overall transfer level: Needs assistance Equipment used: Rolling walker (2 wheeled) Transfers: Sit to/from Stand Sit to Stand: Min assist;Mod assist;+2 physical assistance;+2 safety/equipment         General transfer comment: cues for adherence to post THR, for  L LE fwd and use of UEs to self assist   Ambulation/Gait Ambulation/Gait assistance: Min assist;Mod assist;+2 safety/equipment Ambulation Distance (Feet): 18 Feet Assistive device: Rolling walker (2 wheeled) Gait Pattern/deviations: Step-to pattern;Decreased step length - right;Decreased step length - left;Shuffle;Trunk flexed Gait velocity: decr   General Gait Details: cues for sequence, posture,  position from RW and ER on L  Stairs            Wheelchair Mobility    Modified Rankin (Stroke Patients Only)       Balance                                             Pertinent Vitals/Pain Pain Assessment: 0-10 Pain Score: 3  Pain Location: L hip Pain Descriptors / Indicators: Aching;Sore Pain Intervention(s): Limited activity within patient's tolerance;Monitored during session;Premedicated before session;Ice applied    Home Living Family/patient expects to be discharged to:: Private residence Living Arrangements: Spouse/significant other Available Help at Discharge: Family Type of Home: House Home Access: Stairs to enter Entrance Stairs-Rails: Right Entrance Stairs-Number of Steps: 3 Home Layout: One level Home Equipment: Walker - 2 wheels;Crutches;Cane - single point;Bedside commode      Prior Function Level of Independence: Independent               Hand Dominance        Extremity/Trunk Assessment   Upper Extremity Assessment: Defer to OT evaluation (Significant tremor - pt states off meds since Saturday)           Lower Extremity Assessment: LLE deficits/detail      Cervical / Trunk Assessment: Kyphotic  Communication   Communication: No difficulties  Cognition Arousal/Alertness: Awake/alert Behavior During Therapy: WFL for tasks assessed/performed Overall Cognitive Status: Within Functional Limits for tasks assessed  General Comments      Exercises Total Joint Exercises Ankle Circles/Pumps: 15 reps;Supine;Both;AROM      Assessment/Plan    PT Assessment Patient needs continued PT services  PT Diagnosis Difficulty walking   PT Problem List Decreased strength;Decreased range of motion;Decreased activity tolerance;Decreased balance;Decreased mobility;Decreased knowledge of use of DME;Pain;Decreased knowledge of precautions  PT Treatment Interventions DME instruction;Gait  training;Stair training;Functional mobility training;Therapeutic activities;Therapeutic exercise;Patient/family education   PT Goals (Current goals can be found in the Care Plan section) Acute Rehab PT Goals Patient Stated Goal: Resume previous lifestyle with decreased pain PT Goal Formulation: With patient Time For Goal Achievement: 08/18/15 Potential to Achieve Goals: Good    Frequency 7X/week   Barriers to discharge        Co-evaluation               End of Session Equipment Utilized During Treatment: Gait belt Activity Tolerance: Patient tolerated treatment well Patient left: in chair;with call bell/phone within reach;with family/visitor present Nurse Communication: Mobility status         Time: 4628-6381 PT Time Calculation (min) (ACUTE ONLY): 28 min   Charges:   PT Evaluation $PT Eval Low Complexity: 1 Procedure PT Treatments $Gait Training: 8-22 mins   PT G Codes:        Charles [name] 08-31-2015, 6:28 PM

## 2015-08-12 NOTE — Op Note (Signed)
NAME:  YOSUF, FOLWELL NO.:  [id number]  MEDICAL RECORD NO.:  [id number]  LOCATION:  [facility]                         FACILITY:  [facility]  PHYSICIAN:  Georges Lynch. Mateus Rewerts, M.D.DATE OF BIRTH:  1941/04/23  DATE OF PROCEDURE:  08/12/2015 DATE OF DISCHARGE:                              OPERATIVE REPORT   SURGEON:  Georges Lynch. Ty Buntrock, M.D.  ASSISTANTS: 1. Madlyn Frankel Charlann Boxer, M.D. 2. Lanney Gins, [place].  PREOPERATIVE DIAGNOSIS:  Severe primary osteoarthritis of the left hip.  POSTOPERATIVE DIAGNOSIS:  Severe primary osteoarthritis of the left hip.  OPERATION:  Left total hip arthroplasty utilizing the DePuy system.  I utilized a size 9 Tri-Lock stem.  We used a high offset neck and the neck length with a +5.  The cup was a 56 mm Pinnacle cup with one screw and a hole eliminator.  The insert was a polyethylene insert.  DESCRIPTION OF PROCEDURE:  Under general anesthesia, routine orthopedic prep and draping of the left hip was carried out with the patient on the right side left hip up.  Appropriate time-out was first carried out.  I also marked the appropriate left hip in the holding area.  At this time, the patient had 1 g IV vancomycin.  Following that, a posterolateral approach to the hip was carried out.  Bleeders were identified and cauterized.  I then partially detached the external rotators.  I then did a capsulectomy and dislocated the left hip.  I then amputated the left femoral head in the usual fashion.  At this time, we then utilized the box osteotome, followed by the widening reamer, followed by the canal finder.  After inserting the canal finer, we made sure we had the canal.  We thoroughly irrigated out the canal.  Following that, we rasped the canal up to a size 9 Tri-Lock stem.  We then directed attention to the acetabulum and completed our capsulectomy and removed several large osteophytes from the acetabulum.  We then reamed the acetabulum up to a size 55 mm  or 56 mm cup.  The 56 mm cup was inserted. The Charnley guide then was applied, and we had excellent angle of the cup.  After, we inserted the hole eliminator and the screw, one screw of 30 mm length screw in the acetabulum to affix the cup.  We then inserted the polyethylene liner.  We then went through trials several times.  We first attempted to utilize a normal offset, and then finally a high offset stem was selected.  We then inserted our permanent size 9 high offset stem.  We then went through trials again and utilized a +5 neck length.  Once we had the final ceramic head on and the Tri-Lock stem inserted, we reduced the hip and had excellent function, excellent stability, excellent leg length.  So, we utilized a ceramic +5 head and a 56 mm diameter cup with a polyethylene insert.  We then went through irrigation and then applied some thrombin-soaked Gelfoam, closed the wound layers in usual fashion before we closed the skin.  We injected 30 mL of Exparel.  Sterile dressings were applied.          ______________________________ Windy Fast  Sanjuana Kava, M.D.     RAG/MEDQ  D:  08/12/2015  T:  08/12/2015  Job:  [id number]

## 2015-08-12 NOTE — Brief Op Note (Signed)
08/12/2015  8:44 AM  PATIENT:  Charles [name]  75 y.o. male  PRE-OPERATIVE DIAGNOSIS:Primary  OSTEOARTHRITIS LEFT HIP  POST-OPERATIVE DIAGNOSIS: Primary OSTEOARTHRITIS LEFT HIP  PROCEDURE:  Procedure(s): LEFT TOTAL HIP ARTHROPLASTY (Left)  SURGEON:  Surgeon(s) and Role:    * Ranee Gosselin, MD - Primary    * Durene Romans, MD - Assisting  PHYSICIAN ASSISTANT: Freddie Breech PA  ASSISTANTS: Durene Romans MD  and Freddie Breech PA   ANESTHESIA:   general  EBL:  Total I/O In: -  Out: 410 [Urine:110; Blood:300]  BLOOD ADMINISTERED:none  DRAINS: none   LOCAL MEDICATIONS USED:  BUPIVICAINE 20cc of Exparel  SPECIMEN:  No Specimen  DISPOSITION OF SPECIMEN:  N/A  COUNTS:  YES  TOURNIQUET:  * No tourniquets in log *  DICTATION: .Other Dictation: Dictation Number [id number]  PLAN OF CARE: Admit to inpatient   PATIENT DISPOSITION:  Stable in OR   Delay start of Pharmacological VTE agent (>24hrs) due to surgical blood loss or risk of bleeding: yes

## 2015-08-12 NOTE — Anesthesia Preprocedure Evaluation (Addendum)
Anesthesia Evaluation  Patient identified by MRN, date of birth, ID band Patient awake    Reviewed: Allergy & Precautions, NPO status , Patient's Chart, lab work & pertinent test results  Airway Mallampati: II   Neck ROM: full    Dental   Pulmonary sleep apnea ,    breath sounds clear to auscultation       Cardiovascular hypertension, +CHF  + dysrhythmias Atrial Fibrillation  Rhythm:regular Rate:Normal     Neuro/Psych    GI/Hepatic hiatal hernia, GERD  ,  Endo/Other  Hypothyroidism   Renal/GU Renal InsufficiencyRenal disease     Musculoskeletal  (+) Arthritis ,   Abdominal   Peds  Hematology   Anesthesia Other Findings   Reproductive/Obstetrics                           Anesthesia Physical Anesthesia Plan  ASA: III  Anesthesia Plan: General   Post-op Pain Management:    Induction: Intravenous  Airway Management Planned: Oral ETT  Additional Equipment:   Intra-op Plan:   Post-operative Plan: Extubation in OR  Informed Consent: I have reviewed the patients History and Physical, chart, labs and discussed the procedure including the risks, benefits and alternatives for the proposed anesthesia with the patient or authorized representative who has indicated his/her understanding and acceptance.     Plan Discussed with: CRNA, Anesthesiologist and Surgeon  Anesthesia Plan Comments:         Anesthesia Quick Evaluation

## 2015-08-12 NOTE — Addendum Note (Signed)
Addendum  created 08/12/15 1455 by Donna Bernard, CRNA   Modules edited: Charges VN

## 2015-08-12 NOTE — Anesthesia Postprocedure Evaluation (Signed)
Anesthesia Post Note  Patient: Charles [name]  Procedure(s) Performed: Procedure(s) (LRB): LEFT TOTAL HIP ARTHROPLASTY (Left)  Patient location during evaluation: PACU Anesthesia Type: General Level of consciousness: awake and alert and patient cooperative Pain management: pain level controlled Vital Signs Assessment: post-procedure vital signs reviewed and stable Respiratory status: spontaneous breathing and respiratory function stable Cardiovascular status: stable Anesthetic complications: no    Last Vitals:  Filed Vitals:   08/12/15 1000 08/12/15 1015  BP: 143/63 134/60  Pulse: 59 59  Temp:  36.9 C  Resp: 16 19    Last Pain:  Filed Vitals:   08/12/15 1019  PainSc: 1                  [name]

## 2015-08-12 NOTE — Anesthesia Procedure Notes (Signed)
Procedure Name: Intubation Date/Time: 08/12/2015 7:23 AM Performed by: Donna Bernard Pre-anesthesia Checklist: Patient identified, Emergency Drugs available, Suction available, Patient being monitored and Timeout performed Patient Re-evaluated:Patient Re-evaluated prior to inductionOxygen Delivery Method: Circle system utilized Preoxygenation: Pre-oxygenation with 100% oxygen Intubation Type: IV induction Ventilation: Mask ventilation without difficulty Laryngoscope Size: 3 and Mac Grade View: Grade I Tube type: Oral Tube size: 7.5 mm Number of attempts: 1 Airway Equipment and Method: Stylet Placement Confirmation: positive ETCO2,  ETT inserted through vocal cords under direct vision,  CO2 detector and breath sounds checked- equal and bilateral Secured at: 23 cm Tube secured with: Tape Dental Injury: Teeth and Oropharynx as per pre-operative assessment

## 2015-08-12 NOTE — Interval H&P Note (Signed)
History and Physical Interval Note:  08/12/2015 7:07 AM  Charles [name]  has presented today for surgery, with the diagnosis of OA LEFT HIP  The various methods of treatment have been discussed with the patient and family. After consideration of risks, benefits and other options for treatment, the patient has consented to  Procedure(s): LEFT TOTAL HIP ARTHROPLASTY (Left) as a surgical intervention .  The patient's history has been reviewed, patient examined, no change in status, stable for surgery.  I have reviewed the patient's chart and labs.  Questions were answered to the patient's satisfaction.     Cristobal Advani A

## 2015-08-12 NOTE — Discharge Instructions (Addendum)
Information on my medicine - Coumadin   (Warfarin)  This medication education was reviewed with me or my healthcare representative as part of my discharge preparation.  The pharmacist that spoke with me during my hospital stay was:  Wynona Canes  Why was Coumadin prescribed for you? Coumadin was prescribed for you because you have a blood clot or a medical condition that can cause an increased risk of forming blood clots. Blood clots can cause serious health problems by blocking the flow of blood to the heart, lung, or brain. Coumadin can prevent harmful blood clots from forming. As a reminder your indication for Coumadin is:   Blood Clot Prevention After Orthopedic Surgery and atrial fibrillation  What test will check on my response to Coumadin? While on Coumadin (warfarin) you will need to have an [name] test regularly to ensure that your dose is keeping you in the desired range. The [name] (international normalized ratio) number is calculated from the result of the laboratory test called prothrombin time (PT).  If an [name] APPOINTMENT HAS NOT ALREADY BEEN MADE FOR YOU please schedule an appointment to have this lab work done by your health care provider within 7 days. Your [name] goal is usually a number between:  2 to 3 or your provider may give you a more narrow range like 2-2.5.  Ask your health care provider during an office visit what your goal [name] is.  What  do you need to  know  About  COUMADIN? Take Coumadin (warfarin) exactly as prescribed by your healthcare provider about the same time each day.  DO NOT stop taking without talking to the doctor who prescribed the medication.  Stopping without other blood clot prevention medication to take the place of Coumadin may increase your risk of developing a new clot or stroke.  Get refills before you run out.  What do you do if you miss a dose? If you miss a dose, take it as soon as you remember on the same day then continue your regularly scheduled regimen  the next day.  Do not take two doses of Coumadin at the same time.  Important Safety Information A possible side effect of Coumadin (Warfarin) is an increased risk of bleeding. You should call your healthcare provider right away if you experience any of the following: ? Bleeding from an injury or your nose that does not stop. ? Unusual colored urine (red or dark brown) or unusual colored stools (red or black). ? Unusual bruising for unknown reasons. ? A serious fall or if you hit your head (even if there is no bleeding).  Some foods or medicines interact with Coumadin (warfarin) and might alter your response to warfarin. To help avoid this: ? Eat a balanced diet, maintaining a consistent amount of Vitamin K. ? Notify your provider about major diet changes you plan to make. ? Avoid alcohol or limit your intake to 1 drink for women and 2 drinks for men per day. (1 drink is 5 oz. wine, 12 oz. beer, or 1.5 oz. liquor.)  Make sure that ANY health care provider who prescribes medication for you knows that you are taking Coumadin (warfarin).  Also make sure the healthcare provider who is monitoring your Coumadin knows when you have started a new medication including herbals and non-prescription products.  Coumadin (Warfarin)  Major Drug Interactions  Increased Warfarin Effect Decreased Warfarin Effect  Alcohol (large quantities) Antibiotics (esp. Septra/Bactrim, Flagyl, Cipro) Amiodarone (Cordarone) Aspirin (ASA) Cimetidine (Tagamet) Megestrol (Megace) NSAIDs (ibuprofen,  naproxen, etc.) Piroxicam (Feldene) Propafenone (Rythmol SR) Propranolol (Inderal) Isoniazid (INH) Posaconazole (Noxafil) Barbiturates (Phenobarbital) Carbamazepine (Tegretol) Chlordiazepoxide (Librium) Cholestyramine (Questran) Griseofulvin Oral Contraceptives Rifampin Sucralfate (Carafate) Vitamin K   Coumadin (Warfarin) Major Herbal Interactions  Increased Warfarin Effect Decreased Warfarin Effect   Garlic Ginseng Ginkgo biloba Coenzyme Q10 Green tea St. Johns wort    Coumadin (Warfarin) FOOD Interactions  Eat a consistent number of servings per week of foods HIGH in Vitamin K (1 serving =  cup)  Collards (cooked, or boiled & drained) Kale (cooked, or boiled & drained) Mustard greens (cooked, or boiled & drained) Parsley *serving size only =  cup Spinach (cooked, or boiled & drained) Swiss chard (cooked, or boiled & drained) Turnip greens (cooked, or boiled & drained)  Eat a consistent number of servings per week of foods MEDIUM-HIGH in Vitamin K (1 serving = 1 cup)  Asparagus (cooked, or boiled & drained) Broccoli (cooked, boiled & drained, or raw & chopped) Brussel sprouts (cooked, or boiled & drained) *serving size only =  cup Lettuce, raw (green leaf, endive, romaine) Spinach, raw Turnip greens, raw & chopped   These websites have more information on Coumadin (warfarin):  http://www.king-russell.com/; https://www.hines.net/;     INSTRUCTIONS AFTER JOINT REPLACEMENT   o Remove items at home which could result in a fall. This includes throw rugs or furniture in walking pathways o ICE to the affected joint every three hours while awake for 30 minutes at a time, for at least the first 3-5 days, and then as needed for pain and swelling.  Continue to use ice for pain and swelling. You may notice swelling that will progress down to the foot and ankle.  This is normal after surgery.  Elevate your leg when you are not up walking on it.   o Continue to use the breathing machine you got in the hospital (incentive spirometer) which will help keep your temperature down.  It is common for your temperature to cycle up and down following surgery, especially at night when you are not up moving around and exerting yourself.  The breathing machine keeps your lungs expanded and your temperature down.   DIET:  As you were doing prior to hospitalization, we recommend a well-balanced  diet.  DRESSING / WOUND CARE / SHOWERING  Keep the surgical dressing until follow up.  The dressing is water proof, so you can shower without any extra covering.  IF THE DRESSING FALLS OFF or the wound gets wet inside, change the dressing with sterile gauze.  Please use good hand washing techniques before changing the dressing.  Do not use any lotions or creams on the incision until instructed by your surgeon.    ACTIVITY  o Increase activity slowly as tolerated, but follow the weight bearing instructions below.   o No driving for 6 weeks or until further direction given by your physician.  You cannot drive while taking narcotics.  o No lifting or carrying greater than 10 lbs. until further directed by your surgeon. o Avoid periods of inactivity such as sitting longer than an hour when not asleep. This helps prevent blood clots.  o You may return to work once you are authorized by your doctor.     WEIGHT BEARING   Weight bearing as tolerated with assist device (walker, cane, etc) as directed, use it as long as suggested by your surgeon or therapist, typically at least 4-6 weeks.   CONSTIPATION  Constipation is defined medically as fewer than three stools  per week and severe constipation as less than one stool per week.  Even if you have a regular bowel pattern at home, your normal regimen is likely to be disrupted due to multiple reasons following surgery.  Combination of anesthesia, postoperative narcotics, change in appetite and fluid intake all can affect your bowels.   YOU MUST use at least one of the following options; they are listed in order of increasing strength to get the job done.  They are all available over the counter, and you may need to use some, POSSIBLY even all of these options:    Drink plenty of fluids (prune juice may be helpful) and high fiber foods Colace 100 mg by mouth twice a day  Senokot for constipation as directed and as needed Dulcolax (bisacodyl), take  with full glass of water  Miralax (polyethylene glycol) once or twice a day as needed.  If you have tried all these things and are unable to have a bowel movement in the first 3-4 days after surgery call either your surgeon or your primary doctor.    If you experience loose stools or diarrhea, hold the medications until you stool forms back up.  If your symptoms do not get better within 1 week or if they get worse, check with your doctor.  If you experience "the worst abdominal pain ever" or develop nausea or vomiting, please contact the office immediately for further recommendations for treatment.   ITCHING:  If you experience itching with your medications, try taking only a single pain pill, or even half a pain pill at a time.  You can also use Benadryl over the counter for itching or also to help with sleep.   TED HOSE STOCKINGS:  Use stockings on both legs until for at least 2 weeks or as directed by physician office. They may be removed at night for sleeping.  MEDICATIONS:  See your medication summary on the After Visit Summary that nursing will review with you.  You may have some home medications which will be placed on hold until you complete the course of blood thinner medication.  It is important for you to complete the blood thinner medication as prescribed.  PRECAUTIONS:  If you experience chest pain or shortness of breath - call 911 immediately for transfer to the hospital emergency department.   If you develop a fever greater that 101 F, purulent drainage from wound, increased redness or drainage from wound, foul odor from the wound/dressing, or calf pain - CONTACT YOUR SURGEON.                                                   FOLLOW-UP APPOINTMENTS:  If you do not already have a post-op appointment, please call the office for an appointment to be seen by your surgeon.  Guidelines for how soon to be seen are listed in your After Visit Summary, but are typically between 1-4 weeks  after surgery.   MAKE SURE YOU:   Understand these instructions.   Get help right away if you are not doing well or get worse.    Thank you for letting us be a part of your medical care team.  It is a privilege we respect greatly.  We hope these instructions will help you stay on track for a fast and full recovery!

## 2015-08-12 NOTE — Transfer of Care (Signed)
Immediate Anesthesia Transfer of Care Note  Patient: Charles [name]  Procedure(s) Performed: Procedure(s): LEFT TOTAL HIP ARTHROPLASTY (Left)  Patient Location: PACU  Anesthesia Type:General  Level of Consciousness: awake, oriented and patient cooperative  Airway & Oxygen Therapy: Patient connected to face mask oxygen  Post-op Assessment: Post -op Vital signs reviewed and stable  Post vital signs: stable  Last Vitals:  Filed Vitals:   08/12/15 0529 08/12/15 0913  BP: 163/64 143/83  Pulse: 77   Temp: 36.9 C 37.1 C  Resp: 18 18    Complications: No apparent anesthesia complications

## 2015-08-13 LAB — CBC
HCT: 23.9 % — ABNORMAL LOW (ref 39.0–52.0)
Hemoglobin: 8.2 g/dL — ABNORMAL LOW (ref 13.0–17.0)
MCH: 31.1 pg (ref 26.0–34.0)
MCHC: 34.3 g/dL (ref 30.0–36.0)
MCV: 90.5 fL (ref 78.0–100.0)
PLATELETS: 122 10*3/uL — AB (ref 150–400)
RBC: 2.64 MIL/uL — AB (ref 4.22–5.81)
RDW: 13.9 % (ref 11.5–15.5)
WBC: 10.2 10*3/uL (ref 4.0–10.5)

## 2015-08-13 LAB — BASIC METABOLIC PANEL
ANION GAP: 8 (ref 5–15)
BUN: 25 mg/dL — ABNORMAL HIGH (ref 6–20)
CO2: 26 mmol/L (ref 22–32)
Calcium: 8.2 mg/dL — ABNORMAL LOW (ref 8.9–10.3)
Chloride: 102 mmol/L (ref 101–111)
Creatinine, Ser: 1.99 mg/dL — ABNORMAL HIGH (ref 0.61–1.24)
GFR, EST AFRICAN AMERICAN: 36 mL/min — AB (ref >60)
GFR, EST NON AFRICAN AMERICAN: 31 mL/min — AB (ref >60)
Glucose, Bld: 121 mg/dL — ABNORMAL HIGH (ref 65–99)
POTASSIUM: 4.3 mmol/L (ref 3.5–5.1)
SODIUM: 136 mmol/L (ref 135–145)

## 2015-08-13 LAB — HEMOGLOBIN AND HEMATOCRIT, BLOOD
HEMATOCRIT: 25.9 % — AB (ref 39.0–52.0)
HEMOGLOBIN: 8.5 g/dL — AB (ref 13.0–17.0)

## 2015-08-13 MED ORDER — FERROUS SULFATE 325 (65 FE) MG PO TABS: 325.0000 mg | ORAL_TABLET | Freq: Every day | ORAL | Status: DC

## 2015-08-13 MED ORDER — EDOXABAN TOSYLATE 30 MG PO TABS: 30.0000 mg | ORAL_TABLET | ORAL | Status: DC

## 2015-08-13 MED ORDER — ASPIRIN 325 MG PO TABS
325.0000 mg | ORAL_TABLET | Freq: Once | ORAL | Status: AC
  Administered 2015-08-13: 325 mg via ORAL
  Filled 2015-08-13: qty 1

## 2015-08-13 MED ORDER — EDOXABAN TOSYLATE 60 MG PO TABS
60.0000 mg | ORAL_TABLET | ORAL | Status: DC
Start: 2015-08-13 — End: 2015-08-13

## 2015-08-13 MED ORDER — ASPIRIN 325 MG PO TABS
325.0000 mg | ORAL_TABLET | Freq: Two times a day (BID) | ORAL | Status: DC
  Administered 2015-08-13: 325 mg via ORAL
  Filled 2015-08-13 (×3): qty 1

## 2015-08-13 NOTE — Progress Notes (Signed)
Notified on-call PA Alfredo Martinez about temp of 101.1. No new orders given. Tylenol given to patient. Herbalist usage. Will continue to monitor.

## 2015-08-13 NOTE — Progress Notes (Signed)
   Subjective: 1 Day Post-Op Procedure(s) (LRB): LEFT TOTAL HIP ARTHROPLASTY (Left) Patient reports pain as mild.   Patient seen in rounds for Dr. Darrelyn Hillock. Patient is well, and has had no acute complaints or problems. No issues overnight. Denies SOB or chest pain. Foley in. Positive flatus. NO SOB, chest pain, or dizziness.    Objective: Vital signs in last 24 hours: Temp:  [98.1 F (36.7 C)-99.8 F (37.7 C)] 99.1 F (37.3 C) (02/17 0519) Pulse Rate:  [59-77] 77 (02/17 0744) Resp:  [12-22] 18 (02/17 0519) BP: (114-146)/(40-99) 129/48 mmHg (02/17 0744) SpO2:  [91 %-100 %] 99 % (02/17 0519)  Intake/Output from previous day:  Intake/Output Summary (Last 24 hours) at 08/13/15 0747 Last data filed at 08/13/15 0544  Gross per 24 hour  Intake   3530 ml  Output   1410 ml  Net   2120 ml     Labs:  Recent Labs  08/13/15 0356  HGB 8.2*    Recent Labs  08/13/15 0356  WBC 10.2  RBC 2.64*  HCT 23.9*  PLT 122*    Recent Labs  08/13/15 0356  NA 136  K 4.3  CL 102  CO2 26  BUN 25*  CREATININE 1.99*  GLUCOSE 121*  CALCIUM 8.2*    Recent Labs  08/12/15 0600  [name] 1.22    EXAM General - Patient is Alert and Oriented Extremity - Neurologically intact Intact pulses distally Dorsiflexion/Plantar flexion intact No cellulitis present Compartment soft Dressing/Incision - clean, dry, no drainage Motor Function - intact, moving foot and toes well on exam.   Past Medical History  Diagnosis Date  . Paroxysmal atrial fibrillation (HCC)     a. H/o difficult to control. b. s/p PVI ablation 12-03-2012 by [name] with recurrence afterwards, requiring period of amiodarone.  . Peripheral edema   . Mitral regurgitation     a. Mild by echo in 01/2014.  Marland Kitchen Atrial dilatation     mild biatrial dilitation  . Nonischemic cardiomyopathy (HCC)     a. presumed to be tachycardia mediated. Varying EFs over the years from 35-40% to normal and back.  . Hypertension   .  Hypercholesterolemia   . CKD (chronic kidney disease), stage III   . GERD (gastroesophageal reflux disease)   . H/O hiatal hernia   . Arthritis   . Tear of medial meniscus of left knee   . Bruises easily   . Hypothyroidism   . Sleep apnea     USES C PAP  . Hyperglycemia   . Tremor   . CHF (congestive heart failure) (HCC)   . Dysrhythmia     atrial fibrillation  . Coronary artery disease     Assessment/Plan: 1 Day Post-Op Procedure(s) (LRB): LEFT TOTAL HIP ARTHROPLASTY (Left) Active Problems:   H/O total hip arthroplasty  Estimated body mass index is 28.43 kg/(m^2) as calculated from the following:   Height as of this encounter: 5\' 11"  (1.803 m).   Weight as of this encounter: 92.42 kg (203 lb 12 oz). Advance diet Up with therapy Plan for discharge tomorrow  DVT Prophylaxis - Xarelto (will go back to [facility] upon discharge) PWB 50% left LE  He is doing well. Will recheck H/H this afternoon and make a decision about blood transfusion. Keep foley until then. Plan for DC home tomorrow with HHPT.   Dimitri Ped, PA-C Orthopaedic Surgery 08/13/2015, 7:47 AM

## 2015-08-13 NOTE — Progress Notes (Signed)
Physical Therapy Treatment Patient Details Name: IRWIN RUBIO MRN: [id number] DOB: Sep 29, 1940 Today's Date: 08/13/2015    History of Present Illness L THR; Hx of R THR (~05), CHF, CAD, tremor and peripheral edema    PT Comments    POD # 1 am session Assisted with amb a greater distance in hallway.  Used + 2 assist for safety such that recliner was following.  Mild tremors noted.  Required increased time to complete gait distance.    Follow Up Recommendations  Home health PT     Equipment Recommendations  None recommended by PT    Recommendations for Other Services       Precautions / Restrictions Precautions Precautions: Posterior Hip;Fall Precaution Booklet Issued: Yes (comment) Precaution Comments: pt recalls 2/3 precautions.  reviewed Restrictions Weight Bearing Restrictions: Yes LLE Weight Bearing: Partial weight bearing LLE Partial Weight Bearing Percentage or Pounds: 50% Other Position/Activity Restrictions: pt aware    Mobility  Bed Mobility         Supine to sit: Min assist;HOB elevated (20 degrees)     General bed mobility comments: OOB in recliner  Transfers Overall transfer level: Needs assistance Equipment used: Rolling walker (2 wheeled) Transfers: Sit to/from Stand Sit to Stand: Min assist         General transfer comment: assist to rise and steady  Ambulation/Gait Ambulation/Gait assistance: Min assist;+2 safety/equipment Ambulation Distance (Feet): 38 Feet Assistive device: Rolling walker (2 wheeled) Gait Pattern/deviations: Step-to pattern;Decreased stance time - left;Trunk flexed Gait velocity: decr   General Gait Details: cues for sequence, posture, position from RW and ER on L   Stairs            Wheelchair Mobility    Modified Rankin (Stroke Patients Only)       Balance                                    Cognition Arousal/Alertness: Awake/alert Behavior During Therapy: WFL for tasks  assessed/performed Overall Cognitive Status: Within Functional Limits for tasks assessed                      Exercises      General Comments        Pertinent Vitals/Pain Pain Assessment: Faces Faces Pain Scale: Hurts a little bit Pain Location: L hip Pain Descriptors / Indicators: Sore;Tender Pain Intervention(s): Monitored during session;Premedicated before session;Repositioned;Ice applied    Home Living Family/patient expects to be discharged to:: Private residence Living Arrangements: Spouse/significant other Available Help at Discharge: Family         Home Equipment: Dan Humphreys - 2 wheels;Crutches;Cane - single point;Bedside commode      Prior Function Level of Independence: Independent          PT Goals (current goals can now be found in the care plan section) Acute Rehab PT Goals Patient Stated Goal: Resume previous lifestyle with decreased pain Progress towards PT goals: Progressing toward goals    Frequency  7X/week    PT Plan Current plan remains appropriate    Co-evaluation             End of Session Equipment Utilized During Treatment: Gait belt Activity Tolerance: Patient tolerated treatment well Patient left: in chair;with call bell/phone within reach;with family/visitor present     Time: 1110-1140 PT Time Calculation (min) (ACUTE ONLY): 30 min  Charges:  $Gait Training: 8-22 mins $Therapeutic Activity:  8-22 mins                    G Codes:      Rica Koyanagi  PTA [facility]  Acute  Rehab Pager      [phone]

## 2015-08-13 NOTE — Care Management Note (Signed)
Case Management Note  Patient Details  Name: Charles [name] MRN: [id number] Date of Birth: 06/27/1940  Subjective/Objective:         Left Total Hip Arthroplasty         Action/Plan: NCM spoke to pt and wife, Charles [name] [phone]. Pt states he has RW and 3n1 bedside commode. Wife at home to assist with his care. Offered choice [facility] list provided. Pt requested [facility] for [facility]. Contacted [facility] for [facility].   Expected Discharge Date:  08/14/15              Expected Discharge Plan:  Home w Home Health Services  In-House Referral:  NA  Discharge planning Services  CM Consult  Post Acute Care Choice:  Home Health Choice offered to:  Patient  DME Arranged:  N/A DME Agency:  NA  HH Arranged:  PT HH Agency:  [facility]  Status of Service:  Completed, signed off  Medicare Important Message Given:    Date Medicare IM Given:    Medicare IM give by:    Date Additional Medicare IM Given:    Additional Medicare Important Message give by:     If discussed at Long Length of Stay Meetings, dates discussed:    Additional Comments:  Elliot Cousin, RN 08/13/2015, 11:00 AM

## 2015-08-13 NOTE — Progress Notes (Signed)
Physical Therapy Treatment Patient Details Name: Charles [name] MRN: [id number] DOB: [birth year] Today's Date: 08/13/2015    History of Present Illness L THR; Hx of R THR (~05), CHF, CAD, tremor and peripheral edema    PT Comments    POD # 1 pm session Assisted OOB to amb to BR then in hallway.   Assisted back to bed. Noted increased tremors.  Unsteady gait.    Follow Up Recommendations  Home health PT     Equipment Recommendations  None recommended by PT    Recommendations for Other Services       Precautions / Restrictions Precautions Precautions: Posterior Hip;Fall Precaution Comments: pt recalls 2/3 precautions.  reviewed Restrictions Weight Bearing Restrictions: Yes LLE Weight Bearing: Partial weight bearing LLE Partial Weight Bearing Percentage or Pounds: 50% Other Position/Activity Restrictions: pt aware    Mobility  Bed Mobility Overal bed mobility: Needs Assistance Bed Mobility: Supine to Sit;Sit to Supine     Supine to sit: Min assist Sit to supine: Min assist   General bed mobility comments: assist with L LE and increased time  Transfers Overall transfer level: Needs assistance Equipment used: Rolling walker (2 wheeled) Transfers: Sit to/from Stand Sit to Stand: Min assist         General transfer comment: assist to rise and steady  Ambulation/Gait Ambulation/Gait assistance: Min assist;+2 safety/equipment Ambulation Distance (Feet): 50 Feet Assistive device: Rolling walker (2 wheeled) Gait Pattern/deviations: Step-to pattern;Decreased stance time - left;Trunk flexed Gait velocity: decr   General Gait Details: cues for sequence, posture, position from RW and ER on L   Stairs            Wheelchair Mobility    Modified Rankin (Stroke Patients Only)       Balance                                    Cognition Arousal/Alertness: Awake/alert Behavior During Therapy: WFL for tasks assessed/performed Overall  Cognitive Status: Within Functional Limits for tasks assessed                      Exercises      General Comments        Pertinent Vitals/Pain Pain Assessment: Faces Faces Pain Scale: Hurts a little bit Pain Location: L hip Pain Descriptors / Indicators: Sore;Tender Pain Intervention(s): Monitored during session;Premedicated before session;Repositioned;Ice applied    Home Living                      Prior Function            PT Goals (current goals can now be found in the care plan section) Progress towards PT goals: Progressing toward goals    Frequency  7X/week    PT Plan Current plan remains appropriate    Co-evaluation             End of Session Equipment Utilized During Treatment: Gait belt Activity Tolerance: Patient tolerated treatment well Patient left: in chair;with call bell/phone within reach;with family/visitor present     Time: 1425-1450 PT Time Calculation (min) (ACUTE ONLY): 25 min  Charges:  $Gait Training: 8-22 mins $Therapeutic Activity: 8-22 mins                    G Codes:      Felecia Shelling  PTA [facility]  Acute  Rehab Pager  [phone]  

## 2015-08-13 NOTE — Evaluation (Signed)
Occupational Therapy Evaluation Patient Details Name: Charles [name] MRN: [id number] DOB: 22-Apr-1941 Today's Date: 08/13/2015    History of Present Illness L THR; Hx of R THR (~05), CHF, CAD, tremor and peripheral edema   Clinical Impression   *This 75 year old male was admitted for the above surgery. All education was completed. No further OT is needed at this time    Follow Up Recommendations  Supervision/Assistance - 24 hour    Equipment Recommendations  None recommended by OT    Recommendations for Other Services       Precautions / Restrictions Precautions Precautions: Posterior Hip;Fall Precaution Booklet Issued: Yes (comment) Precaution Comments: pt verbalizes all posterior precautions (sign in room) Restrictions Weight Bearing Restrictions: Yes LLE Weight Bearing: Partial weight bearing LLE Partial Weight Bearing Percentage or Pounds: 50%      Mobility Bed Mobility         Supine to sit: Min assist;HOB elevated (20 degrees)     General bed mobility comments: used bedrails.  Cues for sequence  Transfers   Equipment used: Rolling walker (2 wheeled) Transfers: Sit to/from Stand Sit to Stand: Min assist         General transfer comment: assist to rise and steady    Balance                                            ADL Overall ADL's : Needs assistance/impaired     Grooming: Oral care;Min guard;Standing                   Toilet Transfer: Minimal assistance;Ambulation;BSC       Tub/ Shower Transfer: Walk-in shower;Minimal assistance;Ambulation     General ADL Comments: Wife will assist pt with ADLs.  Reviewed posterior THPS and pt performed bathroom transfers and brushed his teeth. Shower transfer handout given.       Vision     Perception     Praxis      Pertinent Vitals/Pain Pain Assessment: Faces Faces Pain Scale: Hurts little more Pain Location: L hip Pain Descriptors / Indicators: Sore Pain  Intervention(s): Limited activity within patient's tolerance;Monitored during session;Premedicated before session;Repositioned (ice removed; declined further)     Hand Dominance     Extremity/Trunk Assessment Upper Extremity Assessment Upper Extremity Assessment: Overall WFL for tasks assessed (pt does have a tremor)           Communication Communication Communication: HOH   Cognition Arousal/Alertness: Awake/alert Behavior During Therapy: WFL for tasks assessed/performed Overall Cognitive Status: Within Functional Limits for tasks assessed                     General Comments       Exercises       Shoulder Instructions      Home Living Family/patient expects to be discharged to:: Private residence Living Arrangements: Spouse/significant other Available Help at Discharge: Family               Bathroom Shower/Tub: Walk-in Human resources officer: Standard     Home Equipment: Environmental consultant - 2 wheels;Crutches;Cane - single point;Bedside commode          Prior Functioning/Environment Level of Independence: Independent             OT Diagnosis: Generalized weakness   OT Problem List:     OT Treatment/Interventions:  OT Goals(Current goals can be found in the care plan section) Acute Rehab OT Goals Patient Stated Goal: Resume previous lifestyle with decreased pain OT Goal Formulation: All assessment and education complete, DC therapy  OT Frequency:     Barriers to D/C:            Co-evaluation              End of Session    Activity Tolerance: Patient tolerated treatment well Patient left: in chair;with call bell/phone within reach;with family/visitor present   Time: 1031-1059 OT Time Calculation (min): 28 min Charges:  OT General Charges $OT Visit: 1 Procedure OT Evaluation $OT Eval Low Complexity: 1 Procedure OT Treatments $Self Care/Home Management : 8-22 mins G-Codes:    Viona Hosking 08/24/15, 11:19  AM  Marica Otter, OTR/L [phone] 2015-08-24

## 2015-08-14 LAB — CBC
HCT: 23.3 % — ABNORMAL LOW (ref 39.0–52.0)
HEMOGLOBIN: 7.7 g/dL — AB (ref 13.0–17.0)
MCH: 30.2 pg (ref 26.0–34.0)
MCHC: 33 g/dL (ref 30.0–36.0)
MCV: 91.4 fL (ref 78.0–100.0)
PLATELETS: 127 10*3/uL — AB (ref 150–400)
RBC: 2.55 MIL/uL — AB (ref 4.22–5.81)
RDW: 13.9 % (ref 11.5–15.5)
WBC: 8.6 10*3/uL (ref 4.0–10.5)

## 2015-08-14 LAB — BASIC METABOLIC PANEL
Anion gap: 8 (ref 5–15)
Anion gap: 10 (ref 5–15)
BUN: 32 mg/dL — AB (ref 6–20)
BUN: 33 mg/dL — AB (ref 6–20)
CALCIUM: 8.2 mg/dL — AB (ref 8.9–10.3)
CHLORIDE: 99 mmol/L — AB (ref 101–111)
CO2: 25 mmol/L (ref 22–32)
CO2: 26 mmol/L (ref 22–32)
Calcium: 8.1 mg/dL — ABNORMAL LOW (ref 8.9–10.3)
Chloride: 98 mmol/L — ABNORMAL LOW (ref 101–111)
Creatinine, Ser: 2.25 mg/dL — ABNORMAL HIGH (ref 0.61–1.24)
Creatinine, Ser: 2.17 mg/dL — ABNORMAL HIGH (ref 0.61–1.24)
GFR calc Af Amer: 32 mL/min — ABNORMAL LOW (ref >60)
GFR, EST AFRICAN AMERICAN: 31 mL/min — AB (ref >60)
GFR, EST NON AFRICAN AMERICAN: 27 mL/min — AB (ref >60)
GFR, EST NON AFRICAN AMERICAN: 28 mL/min — AB (ref >60)
GLUCOSE: 133 mg/dL — AB (ref 65–99)
Glucose, Bld: 92 mg/dL (ref 65–99)
POTASSIUM: 4.2 mmol/L (ref 3.5–5.1)
Potassium: 3.8 mmol/L (ref 3.5–5.1)
SODIUM: 132 mmol/L — AB (ref 135–145)
SODIUM: 134 mmol/L — AB (ref 135–145)

## 2015-08-14 LAB — PREPARE RBC (CROSSMATCH)

## 2015-08-14 MED ORDER — SODIUM CHLORIDE 0.9 % IV SOLN
Freq: Once | INTRAVENOUS | Status: AC
  Administered 2015-08-14: 10:00:00 via INTRAVENOUS

## 2015-08-14 MED ORDER — WARFARIN - PHARMACIST DOSING INPATIENT: Freq: Every day | Status: DC

## 2015-08-14 MED ORDER — WARFARIN VIDEO: Freq: Once | Status: DC

## 2015-08-14 MED ORDER — WARFARIN SODIUM 4 MG PO TABS
4.0000 mg | ORAL_TABLET | Freq: Once | ORAL | Status: AC
  Administered 2015-08-14: 4 mg via ORAL
  Filled 2015-08-14: qty 1

## 2015-08-14 MED ORDER — SODIUM CHLORIDE 0.9 % IV SOLN: INTRAVENOUS | Status: DC

## 2015-08-14 MED ORDER — COUMADIN BOOK
Freq: Once | Status: AC
  Administered 2015-08-14: 10:00:00
  Filled 2015-08-14: qty 1

## 2015-08-14 NOTE — Progress Notes (Signed)
Physical Therapy Treatment Patient Details Name: Charles [name] MRN: [id number] DOB: 06/07/1941 Today's Date: 08/14/2015    History of Present Illness L THR; Hx of R THR (~05), CHF, CAD, tremor and peripheral edema, afib    PT Comments    Pt requiring incr assist today for bed mobility and transfers; pt to get 2 units of blood, denies dizziness or other symptoms; activity limited by PT d/t pt heart hx/Hgb 7.7; will see in pm if schedules allow;  Pt very pleasant and cooperative with PT;  Follow Up Recommendations  Home health PT     Equipment Recommendations  None recommended by PT    Recommendations for Other Services       Precautions / Restrictions Precautions Precautions: Posterior Hip;Fall Restrictions Weight Bearing Restrictions: Yes LLE Weight Bearing: Partial weight bearing LLE Partial Weight Bearing Percentage or Pounds: 50% Other Position/Activity Restrictions: THP reviewed    Mobility  Bed Mobility Overal bed mobility: Needs Assistance Bed Mobility: Supine to Sit     Supine to sit: Mod assist     General bed mobility comments: assist with trunk,  L LE and increased time  Transfers Overall transfer level: Needs assistance Equipment used: Rolling walker (2 wheeled) Transfers: Sit to/from [employer] Sit to Stand: Min assist;From elevated surface Stand pivot transfers: Mod assist       General transfer comment: assist to rise and steady, cues for THP and hand placement  Ambulation/Gait Ambulation/Gait assistance: Mod assist Ambulation Distance (Feet): 5 Feet (pivotal steps fwd/back) Assistive device: Rolling walker (2 wheeled) Gait Pattern/deviations: Step-to pattern     General Gait Details: cues for sequence, posture, position from [employer]    Stairs            Wheelchair Mobility    Modified Rankin (Stroke Patients Only)       Balance                                    Cognition Arousal/Alertness:  Awake/alert Behavior During Therapy: WFL for tasks assessed/performed Overall Cognitive Status: Within Functional Limits for tasks assessed                      Exercises Total Joint Exercises Ankle Circles/Pumps: 15 reps;Supine;Both;AROM Quad Sets: Strengthening;Both;10 reps Short Arc Quad: AROM;10 reps;Left Heel Slides: AAROM;Left;10 reps;Supine Hip ABduction/ADduction: AAROM;Left;10 reps;Supine    General Comments        Pertinent Vitals/Pain Pain Assessment: No/denies pain    Home Living                      Prior Function            PT Goals (current goals can now be found in the care plan section) Acute Rehab PT Goals PT Goal Formulation: With patient Time For Goal Achievement: 08/18/15 Potential to Achieve Goals: Good Progress towards PT goals: Progressing toward goals    Frequency  7X/week    PT Plan Current plan remains appropriate    Co-evaluation             End of Session Equipment Utilized During Treatment: Gait belt Activity Tolerance: Patient tolerated treatment well Patient left: in chair;with call bell/phone within reach;with family/visitor present;with chair alarm set     Time: 4098-1191 PT Time Calculation (min) (ACUTE ONLY): 19 min  Charges:  $Therapeutic Exercise: 8-22 mins  G CodesDrucilla Chalet 08/14/2015, 9:37 AM

## 2015-08-14 NOTE — Progress Notes (Signed)
Physical Therapy Treatment Patient Details Name: EUTIMIO GHARIBIAN MRN: [id number] DOB: 1941-04-25 Today's Date: 08/14/2015    History of Present Illness L THR; Hx of R THR (~05), CHF, CAD, tremor and peripheral edema, afib    PT Comments    Marked improvement in activity tolerance vs am session - pt on second unit of transfusion.  Will consider stair training in am.  Follow Up Recommendations  Home health PT     Equipment Recommendations  None recommended by PT    Recommendations for Other Services OT consult     Precautions / Restrictions Precautions Precautions: Posterior Hip;Fall Precaution Booklet Issued: Yes (comment) Precaution Comments: pt recalls 2/3 precautions.  reviewed Restrictions Weight Bearing Restrictions: Yes LLE Weight Bearing: Partial weight bearing LLE Partial Weight Bearing Percentage or Pounds: 50% Other Position/Activity Restrictions: THP reviewed    Mobility  Bed Mobility Overal bed mobility: Needs Assistance Bed Mobility: Sit to Supine       Sit to supine: Min assist   General bed mobility comments: Cues for sequence and use of  R LE to self assist.  Physical assist to manage L LE  Transfers Overall transfer level: Needs assistance Equipment used: Rolling walker (2 wheeled) Transfers: Sit to/from Stand Sit to Stand: Min assist         General transfer comment: assist to rise and steady, cues for THP and hand placement  Ambulation/Gait Ambulation/Gait assistance: Min assist Ambulation Distance (Feet): 100 Feet Assistive device: Rolling walker (2 wheeled) Gait Pattern/deviations: Step-to pattern;Decreased step length - right;Decreased step length - left;Shuffle;Trunk flexed     General Gait Details: cues for sequence, posture, position from RW, and PWB   Stairs            Wheelchair Mobility    Modified Rankin (Stroke Patients Only)       Balance Overall balance assessment: Needs assistance Sitting-balance  support: No upper extremity supported;Feet supported Sitting balance-Leahy Scale: Good     Standing balance support: Bilateral upper extremity supported Standing balance-Leahy Scale: Poor                      Cognition Arousal/Alertness: Awake/alert Behavior During Therapy: WFL for tasks assessed/performed Overall Cognitive Status: Within Functional Limits for tasks assessed                      Exercises      General Comments        Pertinent Vitals/Pain Pain Assessment: 0-10 Pain Score: 3  Pain Location: L hip Pain Descriptors / Indicators: Sore Pain Intervention(s): Limited activity within patient's tolerance;Monitored during session;Premedicated before session;Ice applied    Home Living                      Prior Function            PT Goals (current goals can now be found in the care plan section) Acute Rehab PT Goals Patient Stated Goal: Resume previous lifestyle with decreased pain PT Goal Formulation: With patient Time For Goal Achievement: 08/18/15 Potential to Achieve Goals: Good Progress towards PT goals: Progressing toward goals    Frequency  7X/week    PT Plan Current plan remains appropriate    Co-evaluation             End of Session Equipment Utilized During Treatment: Gait belt Activity Tolerance: Patient tolerated treatment well Patient left: in bed;with call bell/phone within reach;with family/visitor present  Time: 1410-1436 PT Time Calculation (min) (ACUTE ONLY): 26 min  Charges:  $Gait Training: 23-37 mins                    G Codes:      Angles Trevizo 08-21-15, 4:21 PM

## 2015-08-14 NOTE — Progress Notes (Signed)
   Subjective:  Patient reports pain as mild to moderate.  Had fever last night Tmax 101.1 - responded to IS.  Objective:   VITALS:   Filed Vitals:   08/13/15 2206 08/13/15 2305 08/14/15 0500 08/14/15 0725  BP:   114/49 128/47  Pulse: 75  67 73  Temp:  100.5 F (38.1 C) 99.4 F (37.4 C) 99.6 F (37.6 C)  TempSrc:  Oral Oral Oral  Resp:   20 18  Height:      Weight:      SpO2: 94%  95% 94%    ABD soft Sensation intact distally Intact pulses distally Dorsiflexion/Plantar flexion intact Incision: dressing C/D/I Compartment soft   Lab Results  Component Value Date   WBC 8.6 08/14/2015   HGB 7.7* 08/14/2015   HCT 23.3* 08/14/2015   MCV 91.4 08/14/2015   PLT 127* 08/14/2015   BMET    Component Value Date/Time   NA 132* 08/14/2015 0430   K 4.2 08/14/2015 0430   CL 99* 08/14/2015 0430   CO2 25 08/14/2015 0430   GLUCOSE 92 08/14/2015 0430   BUN 32* 08/14/2015 0430   CREATININE 2.25* 08/14/2015 0430   CREATININE 1.69* 05/05/2015 1023   CALCIUM 8.1* 08/14/2015 0430   GFRNONAA 27* 08/14/2015 0430   GFRAA 31* 08/14/2015 0430     Assessment/Plan: 2 Days Post-Op   Active Problems:   H/O total hip arthroplasty   50% WB LLE ABLA: hgb dropped to 7.7, has h/o CAD, transfuse 2 units PRBCs CKD III: mild bump in Creatinine, gentle IVFs today, recheck in am A fib: will start coumadin given CKD, target [name] 2-3 PT/OT   Ramere Downs, Cloyde Reams 08/14/2015, 7:52 AM   Samson Frederic, MD Cell [phone]

## 2015-08-14 NOTE — Progress Notes (Signed)
ANTICOAGULATION CONSULT NOTE - Initial Consult  Pharmacy Consult for Warfarin Indication: atrial fibrillation and VTE prophylaxis  Allergies  Allergen Reactions  . Ceftriaxone Sodium Rash    Rocephin  . Diltiazem Hcl Rash    Cardizem  . Penicillins Rash    Has patient had a PCN reaction causing immediate rash, facial/tongue/throat swelling, SOB or lightheadedness with hypotension:  Has patient had a PCN reaction causing severe rash involving mucus membranes or skin necrosis:  Has patient had a PCN reaction that required hospitalization  Has patient had a PCN reaction occurring within the last 10 years:  If all of the above answers are "NO", then may proceed with Cephalosporin use.     Patient Measurements: Height:  (180.3 cm) Weight: 203 lb 12 oz (92.42 kg) IBW/kg (Calculated) : 75.3  Vital Signs: Temp: 99.6 F (37.6 C) (02/18 0725) Temp Source: Oral (02/18 0725) BP: 128/47 mmHg (02/18 0752) Pulse Rate: 73 (02/18 0752)  Labs:  Recent Labs  08/12/15 0600  08/13/15 0356 08/13/15 1455 08/14/15 0430  HGB  --   < > 8.2* 8.5* 7.7*  HCT  --   --  23.9* 25.9* 23.3*  PLT  --   --  122*  --  127*  LABPROT 15.6*  --   --   --   --   [name] 1.22  --   --   --   --   CREATININE  --   --  1.99*  --  2.25*  < > = values in this interval not displayed.  Estimated Creatinine Clearance: 32.4 mL/min (by C-G formula based on Cr of 2.25).   Medical History: Past Medical History  Diagnosis Date  . Paroxysmal atrial fibrillation (HCC)     a. H/o difficult to control. b. s/p PVI ablation 12-03-2012 by [name] with recurrence afterwards, requiring period of amiodarone.  . Peripheral edema   . Mitral regurgitation     a. Mild by echo in 01/2014.  Marland Kitchen Atrial dilatation     mild biatrial dilitation  . Nonischemic cardiomyopathy (HCC)     a. presumed to be tachycardia mediated. Varying EFs over the years from 35-40% to normal and back.  . Hypertension   . Hypercholesterolemia   .  CKD (chronic kidney disease), stage III   . GERD (gastroesophageal reflux disease)   . H/O hiatal hernia   . Arthritis   . Tear of medial meniscus of left knee   . Bruises easily   . Hypothyroidism   . Sleep apnea     USES C PAP  . Hyperglycemia   . Tremor   . CHF (congestive heart failure) (HCC)   . Dysrhythmia     atrial fibrillation  . Coronary artery disease     Medications:  Scheduled:  . sodium chloride   Intravenous Once  . amiodarone  100 mg Oral q morning - 10a  . enalapril  10 mg Oral QPM  . ferrous sulfate  325 mg Oral TID PC  . furosemide  60 mg Oral q morning - 10a  . levothyroxine  50 mcg Oral QAC breakfast  . metoprolol succinate  25 mg Oral QPM  . metoprolol succinate  50 mg Oral Q breakfast  . pantoprazole  40 mg Oral Daily   Infusions:  . sodium chloride    . lactated ringers 100 mL/hr at 08/12/15 2209    Assessment: 49 yoM previously on Edoxaban  daily (LD on 2/11) for afib.  This was  held prior to surgery, now, s/p left THA on 2/16.  Post-op anticoagulation plan was to use Xarelto x48hrs then return to Edoxaban, but pt and wife refused Xarelto.  Plan was changed to use Aspirin until discharge, then f/u with cardiologist about renal dosing Edoxaban, however SCr continues to increase.  Pharmacy is now consulted to dose warfarin for afib and VTE prophylaxis.  MD reports that pt and wife reluctantly agreed to this change.  Today, 08/14/2015:  [name] at baseline is 1.22  CBC: Hgb decreased to 7.7, Plt low at 127  No bleeding or complications reported.  SCr increased to 2.25  Albumin, LFTs WNL  Diet: regular, eating 0-100% of meals.  Drug-drug interactions: amiodarone may increase [name].  Goal of Therapy:  [name] 2-3 Monitor platelets by anticoagulation protocol: Yes   Plan:   Warfarin 4 mg PO once today  Daily [name] and CBC  Follow up s/s bleeding or thrombosis.  Warfarin book and video  Pharmacist to provide warfarin education prior to  discharge.  Lynann Beaver PharmD, BCPS Pager [phone] 08/14/2015 8:18 AM

## 2015-08-14 NOTE — Progress Notes (Signed)
Subjective: 2 Days Post-Op Procedure(s) (LRB): LEFT TOTAL HIP ARTHROPLASTY (Left) Patient reports pain as 1 on 0-10 scale. I came over to see him after clinic this morning and he is up in the chair and just completed his first unit of Packed cells. His second unit will follow. His PRE-Op BUN was 48 and Creatinine was 1.94. His Creatinine went up to 2.25 and his BUN has decreased.He is being transfused because of a decreasing HBg and his Cardiac issues. We started him on Coumadin and DCd his [name] because of his Creatinine elevation. Will repeat the kidney panel and HBg in the A.M. He should be ready for DC tomorrow. We will have [facility] follow his [name] for a few weeks.ALL his RXs were given to his wife today.   Objective: Vital signs in last 24 hours: Temp:  [98.1 F (36.7 C)-101.1 F (38.4 C)] 99.2 F (37.3 C) (02/18 1015) Pulse Rate:  [66-76] 66 (02/18 1015) Resp:  [16-24] 18 (02/18 1015) BP: (114-128)/(41-54) 114/41 mmHg (02/18 1015) SpO2:  [93 %-99 %] 95 % (02/18 1015)  Intake/Output from previous day: 02/17 0701 - 02/18 0700 In: 1226.7 [P.O.:600; I.V.:626.7] Out: 1125 [Urine:1125] Intake/Output this shift: Total I/O In: 270 [P.O.:240; Blood:30] Out: 525 [Urine:525]   Recent Labs  08/13/15 0356 08/13/15 1455 08/14/15 0430  HGB 8.2* 8.5* 7.7*    Recent Labs  08/13/15 0356 08/13/15 1455 08/14/15 0430  WBC 10.2  --  8.6  RBC 2.64*  --  2.55*  HCT 23.9* 25.9* 23.3*  PLT 122*  --  127*    Recent Labs  08/13/15 0356 08/14/15 0430  NA 136 132*  K 4.3 4.2  CL 102 99*  CO2 26 25  BUN 25* 32*  CREATININE 1.99* 2.25*  GLUCOSE 121* 92  CALCIUM 8.2* 8.1*    Recent Labs  08/12/15 0600  [name] 1.22    Dorsiflexion/Plantar flexion intact No cellulitis present Compartment soft  Assessment/Plan: 2 Days Post-Op Procedure(s) (LRB): LEFT TOTAL HIP ARTHROPLASTY (Left) Up with therapy. Should be ready for DC Sunday. All RXs have been given to his Wife.Blood studies  will be repeated in the morning.  Yacob Wilkerson A 08/14/2015, 1:06 PM

## 2015-08-15 LAB — CBC
HEMATOCRIT: 29.8 % — AB (ref 39.0–52.0)
HEMOGLOBIN: 10.4 g/dL — AB (ref 13.0–17.0)
MCH: 30.7 pg (ref 26.0–34.0)
MCHC: 34.9 g/dL (ref 30.0–36.0)
MCV: 87.9 fL (ref 78.0–100.0)
Platelets: 129 10*3/uL — ABNORMAL LOW (ref 150–400)
RBC: 3.39 MIL/uL — ABNORMAL LOW (ref 4.22–5.81)
RDW: 13.9 % (ref 11.5–15.5)
WBC: 8.5 10*3/uL (ref 4.0–10.5)

## 2015-08-15 LAB — BASIC METABOLIC PANEL
Anion gap: 9 (ref 5–15)
BUN: 31 mg/dL — AB (ref 6–20)
CHLORIDE: 101 mmol/L (ref 101–111)
CO2: 25 mmol/L (ref 22–32)
Calcium: 8 mg/dL — ABNORMAL LOW (ref 8.9–10.3)
Creatinine, Ser: 1.85 mg/dL — ABNORMAL HIGH (ref 0.61–1.24)
GFR calc Af Amer: 39 mL/min — ABNORMAL LOW (ref >60)
GFR calc non Af Amer: 34 mL/min — ABNORMAL LOW (ref >60)
Glucose, Bld: 101 mg/dL — ABNORMAL HIGH (ref 65–99)
POTASSIUM: 4 mmol/L (ref 3.5–5.1)
SODIUM: 135 mmol/L (ref 135–145)

## 2015-08-15 LAB — PROTIME-INR
INR: 1.14 (ref 0.00–1.49)
Prothrombin Time: 14.8 seconds (ref 11.6–15.2)

## 2015-08-15 MED ORDER — WARFARIN SODIUM 5 MG PO TABS
5.0000 mg | ORAL_TABLET | Freq: Once | ORAL | Status: AC
Start: 2015-08-15 — End: 2015-08-15
  Administered 2015-08-15: 5 mg via ORAL
  Filled 2015-08-15: qty 1

## 2015-08-15 NOTE — Progress Notes (Signed)
Charles [name]  MRN: [id number] DOB/Age: 77/01/1941 77 y.o. Physician: Dr. Gioffre Procedure: Procedure(s) (LRB): LEFT TOTAL HIP ARTHROPLASTY (Left)     Subjective: Ready to DC home, has met all goals  Vital Signs Temp:  [98.1 F (36.7 C)-100.1 F (37.8 C)] 100.1 F (37.8 C) (02/19 0621) Pulse Rate:  [64-74] 74 (02/19 0621) Resp:  [18-19] 19 (02/19 0621) BP: (109-144)/(46-61) 144/60 mmHg (02/19 0621) SpO2:  [92 %-99 %] 92 % (02/19 0621)  Lab Results  Recent Labs  08/14/15 0430 08/15/15 0505  WBC 8.6 8.5  HGB 7.7* 10.4*  HCT 23.3* 29.8*  PLT 127* 129*   BMET  Recent Labs  08/14/15 1825 08/15/15 0505  NA 134* 135  K 3.8 4.0  CL 98* 101  CO2 26 25  GLUCOSE 133* 101*  BUN 33* 31*  CREATININE 2.17* 1.85*  CALCIUM 8.2* 8.0*   [name]  Date Value Ref Range Status  08/15/2015 1.14 0.00 - 1.49 Final  10/22/2013 1.9  Final     Exam Hip dressing dry NVI        Plan DC home today  SHUFORD,TRACY PA-C   08/15/2015, 11:12 AM Contact # [phone]     

## 2015-08-15 NOTE — Progress Notes (Signed)
Physical Therapy Treatment Patient Details Name: Charles [name] MRN: [id number] DOB: 1940/07/02 Today's Date: 08/15/2015    History of Present Illness L THR; Hx of R THR (~05), CHF, CAD, tremor and peripheral edema, afib    PT Comments    POD # 3 Pt eager to D/C to home today.  Feeling better.  Assisted with amb a greater distance in hallway.  Practiced stirs with spouse.  Pt recalls 3/3 THP and is aware to stay PWB until f/u ortho appt.   Follow Up Recommendations        Equipment Recommendations       Recommendations for Other Services       Precautions / Restrictions Precautions Precautions: Posterior Hip;Fall Precaution Comments: pt recalls 3/3 THP and is aware PWB Restrictions Weight Bearing Restrictions: Yes LLE Weight Bearing: Partial weight bearing LLE Partial Weight Bearing Percentage or Pounds: 50% Other Position/Activity Restrictions: THP reviewed    Mobility  Bed Mobility               General bed mobility comments: OOB in recliner  Transfers Overall transfer level: Needs assistance Equipment used: Rolling walker (2 wheeled) Transfers: Sit to/from Stand Sit to Stand: Supervision;Min guard         General transfer comment: one VC to extend L LE prior to sit to avoid hip flex > 90 degrees   Ambulation/Gait Ambulation/Gait assistance: Supervision;Min guard Ambulation Distance (Feet): 175 Feet Assistive device: Rolling walker (2 wheeled) Gait Pattern/deviations: Step-to pattern;Decreased stance time - left Gait velocity: decr   General Gait Details: increased time    Stairs Stairs: Yes Stairs assistance: Min guard   Number of Stairs: 2 General stair comments: one initial VC on proper sequencing and safety.   Wheelchair Mobility    Modified Rankin (Stroke Patients Only)       Balance                                    Cognition Arousal/Alertness: Awake/alert Behavior During Therapy: WFL for tasks  assessed/performed Overall Cognitive Status: Within Functional Limits for tasks assessed                      Exercises  10 reps B LE knee presses    General Comments        Pertinent Vitals/Pain Pain Assessment: No/denies pain Pain Score: 5  Pain Location: L hip Pain Descriptors / Indicators: Tender;Sore Pain Intervention(s): Monitored during session;Premedicated before session;Repositioned;Ice applied    Home Living                      Prior Function            PT Goals (current goals can now be found in the care plan section) Progress towards PT goals: Progressing toward goals    Frequency       PT Plan      Co-evaluation             End of Session Equipment Utilized During Treatment: Gait belt Activity Tolerance: Patient tolerated treatment well Patient left: in chair;with call bell/phone within reach;with family/visitor present     Time: 3582-5189 PT Time Calculation (min) (ACUTE ONLY): 25 min  Charges:  $Gait Training: 8-22 mins $Therapeutic Activity: 8-22 mins                    G Codes:  Rica Koyanagi  PTA [facility]  Acute  Rehab Pager      [phone]

## 2015-08-15 NOTE — Progress Notes (Signed)
ANTICOAGULATION CONSULT NOTE  Pharmacy Consult for Warfarin Indication: atrial fibrillation and VTE prophylaxis  Allergies  Allergen Reactions  . Ceftriaxone Sodium Rash    Rocephin  . Diltiazem Hcl Rash    Cardizem  . Penicillins Rash    Has patient had a PCN reaction causing immediate rash, facial/tongue/throat swelling, SOB or lightheadedness with hypotension:  Has patient had a PCN reaction causing severe rash involving mucus membranes or skin necrosis:  Has patient had a PCN reaction that required hospitalization  Has patient had a PCN reaction occurring within the last 10 years:  If all of the above answers are "NO", then may proceed with Cephalosporin use.     Patient Measurements: Height: 5\' 11"  (180.3 cm) Weight: 203 lb 12 oz (92.42 kg) IBW/kg (Calculated) : 75.3  Vital Signs: Temp: 100.1 F (37.8 C) (02/19 0621) Temp Source: Oral (02/19 0621) BP: 144/60 mmHg (02/19 0621) Pulse Rate: 74 (02/19 0621)  Labs:  Recent Labs  08/13/15 0356 08/13/15 1455 08/14/15 0430 08/14/15 1825 08/15/15 0505  HGB 8.2* 8.5* 7.7*  --  10.4*  HCT 23.9* 25.9* 23.3*  --  29.8*  PLT 122*  --  127*  --  129*  LABPROT  --   --   --   --  14.8  [name]  --   --   --   --  1.14  CREATININE 1.99*  --  2.25* 2.17* 1.85*    Estimated Creatinine Clearance: 39.4 mL/min (by C-G formula based on Cr of 1.85).  Assessment: 36 yoM previously on Edoxaban 60mg  daily (LD on 2/11) for afib.  This was held prior to surgery, now, s/p left THA on 2/16.  Post-op anticoagulation plan was to use Xarelto x48hrs then return to Edoxaban, but pt and wife refused Xarelto.  Plan was changed to use Aspirin until discharge, then f/u with cardiologist about renal dosing Edoxaban, however SCr continues to increase.  Pharmacy is now consulted to dose warfarin for afib and VTE prophylaxis.  MD reports that pt and wife reluctantly agreed to this change.  Today, 08/15/2015:  [name] 1.14, subtherapeutic as expected after  only one dose  CBC: Hgb increased to 10.4 s/p transfusion, Plt low but stable at 129k.  No bleeding or complications reported.  SCr improved to 1.85 with CrCl ~ 39 ml/min (baseline SCr ~1.8)  Albumin, LFTs WNL  Diet: regular, eating 0-100% of meals.  Drug-drug interactions: amiodarone may increase [name].  Goal of Therapy:  [name] 2-3 Monitor platelets by anticoagulation protocol: Yes   Plan:   Warfarin 5 mg PO once today  Daily [name] and CBC  Follow up s/s bleeding or thrombosis.  For discharge, if using warfarin, recommend warfarin 5mg  daily with [name] recheck in 3 days.  If renal function is stable or continues to improve, consider resuming PTA Edoxaban (For CrCl 15-50 ml/min, use dose reduced Edoxaban 30mg  daily)  Lynann Beaver PharmD, BCPS Pager [phone] 08/15/2015 10:22 AM

## 2015-08-16 DIAGNOSIS — I4891 Unspecified atrial fibrillation: Secondary | ICD-10-CM | POA: Diagnosis not present

## 2015-08-16 DIAGNOSIS — I2510 Atherosclerotic heart disease of native coronary artery without angina pectoris: Secondary | ICD-10-CM | POA: Diagnosis not present

## 2015-08-16 DIAGNOSIS — M1712 Unilateral primary osteoarthritis, left knee: Secondary | ICD-10-CM | POA: Diagnosis not present

## 2015-08-16 DIAGNOSIS — I509 Heart failure, unspecified: Secondary | ICD-10-CM | POA: Diagnosis not present

## 2015-08-16 DIAGNOSIS — I130 Hypertensive heart and chronic kidney disease with heart failure and stage 1 through stage 4 chronic kidney disease, or unspecified chronic kidney disease: Secondary | ICD-10-CM | POA: Diagnosis not present

## 2015-08-16 DIAGNOSIS — Z471 Aftercare following joint replacement surgery: Secondary | ICD-10-CM | POA: Diagnosis not present

## 2015-08-16 LAB — TYPE AND SCREEN
ABO/RH(D): A POS
Antibody Screen: NEGATIVE
UNIT DIVISION: 0
Unit division: 0

## 2015-08-19 ENCOUNTER — Other Ambulatory Visit: Payer: Self-pay | Admitting: Cardiology

## 2015-08-26 ENCOUNTER — Telehealth: Payer: Self-pay | Admitting: *Deleted

## 2015-08-26 DIAGNOSIS — I4891 Unspecified atrial fibrillation: Secondary | ICD-10-CM

## 2015-08-26 DIAGNOSIS — Z96642 Presence of left artificial hip joint: Secondary | ICD-10-CM | POA: Diagnosis not present

## 2015-08-26 DIAGNOSIS — Z471 Aftercare following joint replacement surgery: Secondary | ICD-10-CM | POA: Diagnosis not present

## 2015-08-26 DIAGNOSIS — M1612 Unilateral primary osteoarthritis, left hip: Secondary | ICD-10-CM | POA: Diagnosis not present

## 2015-08-26 NOTE — Telephone Encounter (Signed)
Spoke with Dr Juliene Pina, will dc pts coumadin and begin apixaban 5 mg bid, cbc bmet 4 weeks. Please call pt with instructions Charles [name]

## 2015-08-26 NOTE — Telephone Encounter (Signed)
Dr Juliene Pina is seeing the pt back post -op and wanted to ask a questions. He did not want to talk to the DOD, he said it was none urgent and to have dr Jens Som call his personal number. Aware dr Jens Som is not in the office today or tomorrow.  Will forward for dr Jens Som review

## 2015-08-30 NOTE — Telephone Encounter (Signed)
Unable to reach pt or leave a message  

## 2015-08-30 NOTE — Telephone Encounter (Signed)
Spoke with pt, he has taken savaysa prior to the warfarin in the past. He would like to do savaysa instead of eliquis if okay with dr Jens Som. Will forward for dr Jens Som review

## 2015-08-30 NOTE — Telephone Encounter (Signed)
GFR is borderline for dosing savaysa; would prefer apixaban 5 bid Charles [name]

## 2015-08-31 DIAGNOSIS — H35033 Hypertensive retinopathy, bilateral: Secondary | ICD-10-CM | POA: Diagnosis not present

## 2015-08-31 DIAGNOSIS — H40013 Open angle with borderline findings, low risk, bilateral: Secondary | ICD-10-CM | POA: Diagnosis not present

## 2015-08-31 DIAGNOSIS — H26491 Other secondary cataract, right eye: Secondary | ICD-10-CM | POA: Diagnosis not present

## 2015-08-31 MED ORDER — APIXABAN 5 MG PO TABS: 5.0000 mg | ORAL_TABLET | Freq: Two times a day (BID) | ORAL | Status: DC

## 2015-08-31 NOTE — Telephone Encounter (Signed)
Spoke with pt, Aware of dr Ludwig Clarks recommendations.  New script sent to the pharmacy  Pt prefers to have lab work done at [facility] office. Pt made aware he will need an appt. Savings card placed at the front desk for pt pick up

## 2015-09-02 NOTE — Discharge Summary (Signed)
Physician Discharge Summary   Patient ID: Charles [name] MRN: [id number] DOB/AGE: 11/08/1940 75 y.o.  Admit date: 08/12/2015 Discharge date: 08/15/2015  Primary Diagnosis: Primary osteoarthritis left knee   Admission Diagnoses:  Past Medical History  Diagnosis Date  . Paroxysmal atrial fibrillation (HCC)     a. H/o difficult to control. b. s/p PVI ablation 12-03-2012 by Dr Allred with recurrence afterwards, requiring period of amiodarone.  . Peripheral edema   . Mitral regurgitation     a. Mild by echo in 01/2014.  . Atrial dilatation     mild biatrial dilitation  . Nonischemic cardiomyopathy (HCC)     a. presumed to be tachycardia mediated. Varying EFs over the years from 35-40% to normal and back.  . Hypertension   . Hypercholesterolemia   . CKD (chronic kidney disease), stage III   . GERD (gastroesophageal reflux disease)   . H/O hiatal hernia   . Arthritis   . Tear of medial meniscus of left knee   . Bruises easily   . Hypothyroidism   . Sleep apnea     USES C PAP  . Hyperglycemia   . Tremor   . CHF (congestive heart failure) (HCC)   . Dysrhythmia     atrial fibrillation  . Coronary artery disease    Discharge Diagnoses:   Active Problems:   H/O total hip arthroplasty  Estimated body mass index is 28.43 kg/(m^2) as calculated from the following:   Height as of this encounter: 5' 11" (1.803 m).   Weight as of this encounter: 92.42 kg (203 lb 12 oz).  Procedure(s) (LRB): LEFT TOTAL HIP ARTHROPLASTY (Left)   Consults: None  HPI: Charles [name], 75 y.o. male, has a history of pain and functional disability in the left hip(s) due to arthritis and patient has failed non-surgical conservative treatments for greater than 12 weeks to include NSAID's and/or analgesics, flexibility and strengthening excercises, use of assistive devices and activity modification. Onset of symptoms was gradual starting 3 years ago with gradually worsening course since that time.The  patient noted no past surgery on the left hip(s). Patient currently rates pain in the left hip at 8 out of 10 with activity. Patient has night pain, worsening of pain with activity and weight bearing, pain that interfers with activities of daily living and pain with passive range of motion. Patient has evidence of subchondral cysts, periarticular osteophytes and joint space narrowing by imaging studies. This condition presents safety issues increasing the risk of falls. There is no current active infection.  Laboratory Data: Admission on 08/12/2015, Discharged on 08/15/2015  Component Date Value Ref Range Status  . Prothrombin Time 08/12/2015 15.6* 11.6 - 15.2 seconds Final  . [name] 08/12/2015 1.22  0.00 - 1.49 Final  . ABO/RH(D) 08/12/2015 A POS   Final  . Antibody Screen 08/12/2015 NEG   Final  . Sample Expiration 08/12/2015 08/15/2015   Final  . Unit Number 08/12/2015 [id number]   Final  . Blood Component Type 08/12/2015 RED CELLS,LR   Final  . Unit division 08/12/2015 00   Final  . Status of Unit 08/12/2015 ISSUED,FINAL   Final  . Transfusion Status 08/12/2015 OK TO TRANSFUSE   Final  . Crossmatch Result 08/12/2015 Compatible   Final  . Unit Number 08/12/2015 [id number]   Final  . Blood Component Type 08/12/2015 RED CELLS,LR   Final  . Unit division 08/12/2015 00   Final  . Status of Unit 08/12/2015 ISSUED,FINAL   Final  . Transfusion   Status 08/12/2015 OK TO TRANSFUSE   Final  . Crossmatch Result 08/12/2015 Compatible   Final  . ABO/RH(D) 08/12/2015 A POS   Final  . WBC 08/13/2015 10.2  4.0 - 10.5 K/uL Final  . RBC 08/13/2015 2.64* 4.22 - 5.81 MIL/uL Final  . Hemoglobin 08/13/2015 8.2* 13.0 - 17.0 g/dL Final  . HCT 08/13/2015 23.9* 39.0 - 52.0 % Final  . MCV 08/13/2015 90.5  78.0 - 100.0 fL Final  . MCH 08/13/2015 31.1  26.0 - 34.0 pg Final  . MCHC 08/13/2015 34.3  30.0 - 36.0 g/dL Final  . RDW 08/13/2015 13.9  11.5 - 15.5 % Final  . Platelets 08/13/2015 122* 150 - 400 K/uL  Final  . Sodium 08/13/2015 136  135 - 145 mmol/L Final  . Potassium 08/13/2015 4.3  3.5 - 5.1 mmol/L Final  . Chloride 08/13/2015 102  101 - 111 mmol/L Final  . CO2 08/13/2015 26  22 - 32 mmol/L Final  . Glucose, Bld 08/13/2015 121* 65 - 99 mg/dL Final  . BUN 08/13/2015 25* 6 - 20 mg/dL Final  . Creatinine, Ser 08/13/2015 1.99* 0.61 - 1.24 mg/dL Final  . Calcium 08/13/2015 8.2* 8.9 - 10.3 mg/dL Final  . GFR calc non Af Amer 08/13/2015 31* >60 mL/min Final  . GFR calc Af Amer 08/13/2015 36* >60 mL/min Final   Comment: (NOTE) The eGFR has been calculated using the CKD EPI equation. This calculation has not been validated in all clinical situations. eGFR's persistently <60 mL/min signify possible Chronic Kidney Disease.   . Anion gap 08/13/2015 8  5 - 15 Final  . Hemoglobin 08/13/2015 8.5* 13.0 - 17.0 g/dL Final  . HCT 08/13/2015 25.9* 39.0 - 52.0 % Final  . WBC 08/14/2015 8.6  4.0 - 10.5 K/uL Final  . RBC 08/14/2015 2.55* 4.22 - 5.81 MIL/uL Final  . Hemoglobin 08/14/2015 7.7* 13.0 - 17.0 g/dL Final  . HCT 08/14/2015 23.3* 39.0 - 52.0 % Final  . MCV 08/14/2015 91.4  78.0 - 100.0 fL Final  . MCH 08/14/2015 30.2  26.0 - 34.0 pg Final  . MCHC 08/14/2015 33.0  30.0 - 36.0 g/dL Final  . RDW 08/14/2015 13.9  11.5 - 15.5 % Final  . Platelets 08/14/2015 127* 150 - 400 K/uL Final  . Sodium 08/14/2015 132* 135 - 145 mmol/L Final  . Potassium 08/14/2015 4.2  3.5 - 5.1 mmol/L Final  . Chloride 08/14/2015 99* 101 - 111 mmol/L Final  . CO2 08/14/2015 25  22 - 32 mmol/L Final  . Glucose, Bld 08/14/2015 92  65 - 99 mg/dL Final  . BUN 08/14/2015 32* 6 - 20 mg/dL Final  . Creatinine, Ser 08/14/2015 2.25* 0.61 - 1.24 mg/dL Final  . Calcium 08/14/2015 8.1* 8.9 - 10.3 mg/dL Final  . GFR calc non Af Amer 08/14/2015 27* >60 mL/min Final  . GFR calc Af Amer 08/14/2015 31* >60 mL/min Final   Comment: (NOTE) The eGFR has been calculated using the CKD EPI equation. This calculation has not been validated  in all clinical situations. eGFR's persistently <60 mL/min signify possible Chronic Kidney Disease.   . Anion gap 08/14/2015 8  5 - 15 Final  . Order Confirmation 08/14/2015 ORDER PROCESSED BY BLOOD BANK   Final  . Sodium 08/14/2015 134* 135 - 145 mmol/L Final  . Potassium 08/14/2015 3.8  3.5 - 5.1 mmol/L Final  . Chloride 08/14/2015 98* 101 - 111 mmol/L Final  . CO2 08/14/2015 26  22 - 32 mmol/L Final  .   Glucose, Bld 08/14/2015 133* 65 - 99 mg/dL Final  . BUN 08/14/2015 33* 6 - 20 mg/dL Final  . Creatinine, Ser 08/14/2015 2.17* 0.61 - 1.24 mg/dL Final  . Calcium 08/14/2015 8.2* 8.9 - 10.3 mg/dL Final  . GFR calc non Af Amer 08/14/2015 28* >60 mL/min Final  . GFR calc Af Amer 08/14/2015 32* >60 mL/min Final   Comment: (NOTE) The eGFR has been calculated using the CKD EPI equation. This calculation has not been validated in all clinical situations. eGFR's persistently <60 mL/min signify possible Chronic Kidney Disease.   . Anion gap 08/14/2015 10  5 - 15 Final  . Sodium 08/15/2015 135  135 - 145 mmol/L Final  . Potassium 08/15/2015 4.0  3.5 - 5.1 mmol/L Final  . Chloride 08/15/2015 101  101 - 111 mmol/L Final  . CO2 08/15/2015 25  22 - 32 mmol/L Final  . Glucose, Bld 08/15/2015 101* 65 - 99 mg/dL Final  . BUN 08/15/2015 31* 6 - 20 mg/dL Final  . Creatinine, Ser 08/15/2015 1.85* 0.61 - 1.24 mg/dL Final  . Calcium 08/15/2015 8.0* 8.9 - 10.3 mg/dL Final  . GFR calc non Af Amer 08/15/2015 34* >60 mL/min Final  . GFR calc Af Amer 08/15/2015 39* >60 mL/min Final   Comment: (NOTE) The eGFR has been calculated using the CKD EPI equation. This calculation has not been validated in all clinical situations. eGFR's persistently <60 mL/min signify possible Chronic Kidney Disease.   . Anion gap 08/15/2015 9  5 - 15 Final  . WBC 08/15/2015 8.5  4.0 - 10.5 K/uL Final  . RBC 08/15/2015 3.39* 4.22 - 5.81 MIL/uL Final  . Hemoglobin 08/15/2015 10.4* 13.0 - 17.0 g/dL Final   Comment: RESULT  REPEATED AND VERIFIED DELTA CHECK NOTED POST TRANSFUSION SPECIMEN   . HCT 08/15/2015 29.8* 39.0 - 52.0 % Final  . MCV 08/15/2015 87.9  78.0 - 100.0 fL Final  . MCH 08/15/2015 30.7  26.0 - 34.0 pg Final  . MCHC 08/15/2015 34.9  30.0 - 36.0 g/dL Final  . RDW 08/15/2015 13.9  11.5 - 15.5 % Final  . Platelets 08/15/2015 129* 150 - 400 K/uL Final  . Prothrombin Time 08/15/2015 14.8  11.6 - 15.2 seconds Final  . [name] 08/15/2015 1.14  0.00 - 1.49 Final  Office Visit on 07/30/2015  Component Date Value Ref Range Status  . TSH 07/30/2015 5.202* 0.350 - 4.500 uIU/mL Final  . Free T4 07/30/2015 1.30  0.80 - 1.80 ng/dL Final  Hospital Outpatient Visit on 07/29/2015  Component Date Value Ref Range Status  . MRSA, PCR 07/29/2015 NEGATIVE  NEGATIVE Final  . Staphylococcus aureus 07/29/2015 POSITIVE* NEGATIVE Final   Comment:        The Xpert SA Assay (FDA approved for NASAL specimens in patients over 46 years of age), is one component of a comprehensive surveillance program.  Test performance has been validated by [facility] for patients greater than or equal to 60 year old. It is not intended to diagnose infection nor to guide or monitor treatment.   Marland Kitchen aPTT 07/29/2015 33  24 - 37 seconds Final  . WBC 07/29/2015 9.0  4.0 - 10.5 K/uL Final  . RBC 07/29/2015 4.16* 4.22 - 5.81 MIL/uL Final  . Hemoglobin 07/29/2015 12.2* 13.0 - 17.0 g/dL Final  . HCT 07/29/2015 37.2* 39.0 - 52.0 % Final  . MCV 07/29/2015 89.4  78.0 - 100.0 fL Final  . MCH 07/29/2015 29.3  26.0 - 34.0 pg Final  .  MCHC 07/29/2015 32.8  30.0 - 36.0 g/dL Final  . RDW 07/29/2015 13.0  11.5 - 15.5 % Final  . Platelets 07/29/2015 230  150 - 400 K/uL Final  . Neutrophils Relative % 07/29/2015 46   Final  . Neutro Abs 07/29/2015 4.1  1.7 - 7.7 K/uL Final  . Lymphocytes Relative 07/29/2015 43   Final  . Lymphs Abs 07/29/2015 3.9  0.7 - 4.0 K/uL Final  . Monocytes Relative 07/29/2015 9   Final  . Monocytes Absolute 07/29/2015 0.9   0.1 - 1.0 K/uL Final  . Eosinophils Relative 07/29/2015 2   Final  . Eosinophils Absolute 07/29/2015 0.2  0.0 - 0.7 K/uL Final  . Basophils Relative 07/29/2015 0   Final  . Basophils Absolute 07/29/2015 0.0  0.0 - 0.1 K/uL Final  . Sodium 07/29/2015 140  135 - 145 mmol/L Final  . Potassium 07/29/2015 4.8  3.5 - 5.1 mmol/L Final  . Chloride 07/29/2015 101  101 - 111 mmol/L Final  . CO2 07/29/2015 28  22 - 32 mmol/L Final  . Glucose, Bld 07/29/2015 110* 65 - 99 mg/dL Final  . BUN 07/29/2015 48* 6 - 20 mg/dL Final  . Creatinine, Ser 07/29/2015 1.94* 0.61 - 1.24 mg/dL Final  . Calcium 07/29/2015 10.1  8.9 - 10.3 mg/dL Final  . Total Protein 07/29/2015 7.9  6.5 - 8.1 g/dL Final  . Albumin 07/29/2015 4.6  3.5 - 5.0 g/dL Final  . AST 07/29/2015 44* 15 - 41 U/L Final  . ALT 07/29/2015 17  17 - 63 U/L Final  . Alkaline Phosphatase 07/29/2015 55  38 - 126 U/L Final  . Total Bilirubin 07/29/2015 0.7  0.3 - 1.2 mg/dL Final  . GFR calc non Af Amer 07/29/2015 32* >60 mL/min Final  . GFR calc Af Amer 07/29/2015 37* >60 mL/min Final   Comment: (NOTE) The eGFR has been calculated using the CKD EPI equation. This calculation has not been validated in all clinical situations. eGFR's persistently <60 mL/min signify possible Chronic Kidney Disease.   . Anion gap 07/29/2015 11  5 - 15 Final  . Prothrombin Time 07/29/2015 17.0* 11.6 - 15.2 seconds Final  . [name] 07/29/2015 1.37  0.00 - 1.49 Final  . Color, Urine 07/29/2015 YELLOW  YELLOW Final  . APPearance 07/29/2015 CLEAR  CLEAR Final  . Specific Gravity, Urine 07/29/2015 1.009  1.005 - 1.030 Final  . pH 07/29/2015 5.0  5.0 - 8.0 Final  . Glucose, UA 07/29/2015 NEGATIVE  NEGATIVE mg/dL Final  . Hgb urine dipstick 07/29/2015 NEGATIVE  NEGATIVE Final  . Bilirubin Urine 07/29/2015 NEGATIVE  NEGATIVE Final  . Ketones, ur 07/29/2015 NEGATIVE  NEGATIVE mg/dL Final  . Protein, ur 07/29/2015 NEGATIVE  NEGATIVE mg/dL Final  . Nitrite 07/29/2015 NEGATIVE   NEGATIVE Final  . Leukocytes, UA 07/29/2015 NEGATIVE  NEGATIVE Final   MICROSCOPIC NOT DONE ON URINES WITH NEGATIVE PROTEIN, BLOOD, LEUKOCYTES, NITRITE, OR GLUCOSE <1000 mg/dL.     X-Rays:Dg Hip Port Unilat With Pelvis 1v Left  08/12/2015  CLINICAL DATA:  Postop left hip arthroplasty EXAM: DG HIP (WITH OR WITHOUT PELVIS) 1V PORT LEFT COMPARISON:  None. FINDINGS: Single frontal portable view of the left hip submitted. There is left hip prosthesis with anatomic alignment. Postsurgical changes are noted with small periarticular soft tissue air. IMPRESSION: Left hip prosthesis with anatomic alignment. Electronically Signed   By: Liviu  Pop M.D.   On: 08/12/2015 10:08    EKG: Orders placed or performed in visit on   07/30/15  . EKG 12-Lead     Hospital Course: Patient was admitted to [facility] and taken to the OR and underwent the above state procedure without complications.  Patient tolerated the procedure well and was later transferred to the recovery room and then to the orthopaedic floor for postoperative care.  They were given PO and IV analgesics for pain control following their surgery.  They were given 24 hours of postoperative antibiotics of  Anti-infectives    Start     Dose/Rate Route Frequency Ordered Stop   08/12/15 1830  vancomycin (VANCOCIN) IVPB 1000 mg/200 mL premix     1,000 mg 200 mL/hr over 60 Minutes Intravenous Every 12 hours 08/12/15 1057 08/12/15 1953   08/12/15 0802  polymyxin B 500,000 Units, bacitracin 50,000 Units in sodium chloride irrigation 0.9 % 500 mL irrigation  Status:  Discontinued       As needed 08/12/15 0803 08/12/15 0926   08/12/15 0530  vancomycin (VANCOCIN) IVPB 1000 mg/200 mL premix     1,000 mg 200 mL/hr over 60 Minutes Intravenous On call to O.R. 08/12/15 0530 08/12/15 0729     and started on DVT prophylaxis in the form of Xarelto.   PT and OT were ordered for total hip protocol.  The patient was allowed to be WBAT with therapy. Discharge  planning was consulted to help with postop disposition and equipment needs.  Patient had a fair night on the evening of surgery.  They started to get up OOB with therapy on day one. Continued to work with therapy into day two. Due to poor renal function, the patient had to be switched from Xarelto to aspirin and subsequently home on Coumadin.  The patient had progressed with therapy and meeting their goals.  Incision was healing well.  Patient was seen in rounds and was ready to go home.   Diet: Cardiac diet Activity:WBAT No bending hip over 90 degrees- A "L" Angle Do not cross legs Do not let foot roll inward When turning these patients a pillow should be placed between the patient's legs to prevent crossing. Patients should have the affected knee fully extended when trying to sit or stand from all surfaces to prevent excessive hip flexion. When ambulating and turning toward the affected side the affected leg should have the toes turned out prior to moving the walker and the rest of patient's body as to prevent internal rotation/ turning in of the leg. Abduction pillows are the most effective way to prevent a patient from not crossing legs or turning toes in at rest. If an abduction pillow is not ordered placing a regular pillow length wise between the patient's legs is also an effective reminder. It is imperative that these precautions be maintained so that the surgical hip does not dislocate. Follow-up:in 2 weeks Disposition - Home Discharged Condition: stable   Discharge Instructions    Call MD / Call 911    Complete by:  As directed   If you experience chest pain or shortness of breath, CALL 911 and be transported to the hospital emergency room.  If you develope a fever above 101 F, pus (white drainage) or increased drainage or redness at the wound, or calf pain, call your surgeon's office.     Constipation Prevention    Complete by:  As directed   Drink plenty of fluids.  Prune juice may  be helpful.  You may use a stool softener, such as Colace (over the counter) 100 mg twice  a day.  Use MiraLax (over the counter) for constipation as needed.     Diet - low sodium heart healthy    Complete by:  As directed      Discharge instructions    Complete by:  As directed   INSTRUCTIONS AFTER JOINT REPLACEMENT   Remove items at home which could result in a fall. This includes throw rugs or furniture in walking pathways ICE to the affected joint every three hours while awake for 30 minutes at a time, for at least the first 3-5 days, and then as needed for pain and swelling.  Continue to use ice for pain and swelling. You may notice swelling that will progress down to the foot and ankle.  This is normal after surgery.  Elevate your leg when you are not up walking on it.   Continue to use the breathing machine you got in the hospital (incentive spirometer) which will help keep your temperature down.  It is common for your temperature to cycle up and down following surgery, especially at night when you are not up moving around and exerting yourself.  The breathing machine keeps your lungs expanded and your temperature down.   DIET:  As you were doing prior to hospitalization, we recommend a well-balanced diet.  DRESSING / WOUND CARE / SHOWERING  Keep the surgical dressing until follow up.  The dressing is water proof, so you can shower without any extra covering.  IF THE DRESSING FALLS OFF or the wound gets wet inside, change the dressing with sterile gauze.  Please use good hand washing techniques before changing the dressing.  Do not use any lotions or creams on the incision until instructed by your surgeon.    ACTIVITY  Increase activity slowly as tolerated, but follow the weight bearing instructions below.   No driving for 6 weeks or until further direction given by your physician.  You cannot drive while taking narcotics.  No lifting or carrying greater than 10 lbs. until further  directed by your surgeon. Avoid periods of inactivity such as sitting longer than an hour when not asleep. This helps prevent blood clots.  You may return to work once you are authorized by your doctor.     WEIGHT BEARING   Weight bearing as tolerated with assist device (walker, cane, etc) as directed, use it as long as suggested by your surgeon or therapist, typically at least 4-6 weeks.   CONSTIPATION  Constipation is defined medically as fewer than three stools per week and severe constipation as less than one stool per week.  Even if you have a regular bowel pattern at home, your normal regimen is likely to be disrupted due to multiple reasons following surgery.  Combination of anesthesia, postoperative narcotics, change in appetite and fluid intake all can affect your bowels.   YOU MUST use at least one of the following options; they are listed in order of increasing strength to get the job done.  They are all available over the counter, and you may need to use some, POSSIBLY even all of these options:    Drink plenty of fluids (prune juice may be helpful) and high fiber foods Colace 100 mg by mouth twice a day  Senokot for constipation as directed and as needed Dulcolax (bisacodyl), take with full glass of water  Miralax (polyethylene glycol) once or twice a day as needed.  If you have tried all these things and are unable to have a bowel movement in the   first 3-4 days after surgery call either your surgeon or your primary doctor.    If you experience loose stools or diarrhea, hold the medications until you stool forms back up.  If your symptoms do not get better within 1 week or if they get worse, check with your doctor.  If you experience "the worst abdominal pain ever" or develop nausea or vomiting, please contact the office immediately for further recommendations for treatment.   ITCHING:  If you experience itching with your medications, try taking only a single pain pill, or  even half a pain pill at a time.  You can also use Benadryl over the counter for itching or also to help with sleep.   TED HOSE STOCKINGS:  Use stockings on both legs until for at least 2 weeks or as directed by physician office. They may be removed at night for sleeping.  MEDICATIONS:  See your medication summary on the "After Visit Summary" that nursing will review with you.  You may have some home medications which will be placed on hold until you complete the course of blood thinner medication.  It is important for you to complete the blood thinner medication as prescribed.  PRECAUTIONS:  If you experience chest pain or shortness of breath - call 911 immediately for transfer to the hospital emergency department.   If you develop a fever greater that 101 F, purulent drainage from wound, increased redness or drainage from wound, foul odor from the wound/dressing, or calf pain - CONTACT YOUR SURGEON.                                                   FOLLOW-UP APPOINTMENTS:  If you do not already have a post-op appointment, please call the office for an appointment to be seen by your surgeon.  Guidelines for how soon to be seen are listed in your "After Visit Summary", but are typically between 1-4 weeks after surgery.   MAKE SURE YOU:  Understand these instructions.  Get help right away if you are not doing well or get worse.    Thank you for letting us be a part of your medical care team.  It is a privilege we respect greatly.  We hope these instructions will help you stay on track for a fast and full recovery!     Discharge patient    Complete by:  As directed   home     Discontinue IV    Complete by:  As directed      Increase activity slowly as tolerated    Complete by:  As directed             Medication List    STOP taking these medications        enalapril 20 MG tablet  Commonly known as:  VASOTEC     [name] 60 MG Tabs tablet  Generic drug:  edoxaban      TAKE these  medications        amiodarone 200 MG tablet  Commonly known as:  PACERONE  Take 100 mg by mouth every morning.     COQ10 PO  Take 100 mg by mouth every morning.     ferrous sulfate 325 (65 FE) MG tablet  Take 1 tablet (325 mg total) by mouth daily with breakfast.       Fish Oil 1000 MG Caps  Take 1 capsule by mouth 2 (two) times daily.     furosemide 40 MG tablet  Commonly known as:  LASIX  Take 60 mg by mouth every morning.     guaiFENesin 600 MG 12 hr tablet  Commonly known as:  MUCINEX  Take 600 mg by mouth 2 (two) times daily as needed for congestion.     HYDROcodone-acetaminophen 5-325 MG tablet  Commonly known as:  NORCO/VICODIN  Take 1-2 tablets by mouth every 4 (four) hours as needed (breakthrough pain).     levothyroxine 50 MCG tablet  Commonly known as:  SYNTHROID, LEVOTHROID  Take 50 mcg by mouth daily before breakfast.     magnesium oxide 400 MG tablet  Commonly known as:  MAG-OX  Take 400 mg by mouth daily at 12 noon.     meloxicam 7.5 MG tablet  Commonly known as:  MOBIC  Take 7.5 mg by mouth every other day.     methocarbamol 500 MG tablet  Commonly known as:  ROBAXIN  Take 1 tablet (500 mg total) by mouth every 6 (six) hours as needed for muscle spasms.     metolazone 5 MG tablet  Commonly known as:  ZAROXOLYN  Take 1 tablet (5 mg total) by mouth daily as needed (fluid retention).     metoprolol succinate 50 MG 24 hr tablet  Commonly known as:  TOPROL-XL  Take 25-50 mg by mouth 2 (two) times daily. Take 1 tablet in the morning and 1/2 tablet in the evening     multivitamin tablet  Take 1 tablet by mouth every morning.     omeprazole 20 MG capsule  Commonly known as:  PRILOSEC  Take 20 mg by mouth daily with lunch.     OSTEO BI-FLEX JOINT SHIELD PO  Take 2 tablets by mouth every morning.     polyvinyl alcohol 1.4 % ophthalmic solution  Commonly known as:  LIQUIFILM TEARS  Place 1 drop into both eyes daily as needed for dry eyes.      vitamin C 500 MG tablet  Commonly known as:  ASCORBIC ACID  Take 500-1,000 mg by mouth 2 (two) times daily. Pt takes two tablets in the morning and one tablet in the evening.     Vitamin D 2000 units Caps  Take 4,000 Units by mouth every morning.     zinc gluconate 50 MG tablet  Take 50 mg by mouth every morning.           Follow-up Information    Follow up with GIOFFRE,RONALD A, MD. Schedule an appointment as soon as possible for a visit in 2 weeks.   Specialty:  Orthopedic Surgery   Contact information:   [address] [place] [address] [phone]       Follow up with [facility].   Why:  Home Health Physical Therapy   Contact information:   [address] [place] [address] [phone]       Signed: Amber Constable, PA-C Orthopaedic Surgery 09/02/2015, 10:47 AM   

## 2015-09-10 DIAGNOSIS — Z96642 Presence of left artificial hip joint: Secondary | ICD-10-CM | POA: Diagnosis not present

## 2015-09-10 DIAGNOSIS — M1612 Unilateral primary osteoarthritis, left hip: Secondary | ICD-10-CM | POA: Diagnosis not present

## 2015-09-10 DIAGNOSIS — Z471 Aftercare following joint replacement surgery: Secondary | ICD-10-CM | POA: Diagnosis not present

## 2015-09-28 ENCOUNTER — Other Ambulatory Visit (INDEPENDENT_AMBULATORY_CARE_PROVIDER_SITE_OTHER): Payer: Medicare Other | Admitting: *Deleted

## 2015-09-28 DIAGNOSIS — I4891 Unspecified atrial fibrillation: Secondary | ICD-10-CM | POA: Diagnosis not present

## 2015-09-28 LAB — CBC
HCT: 35.7 % — ABNORMAL LOW (ref 38.5–50.0)
Hemoglobin: 12.4 g/dL — ABNORMAL LOW (ref 13.2–17.1)
MCH: 31.6 pg (ref 27.0–33.0)
MCHC: 34.7 g/dL (ref 32.0–36.0)
MCV: 91.1 fL (ref 80.0–100.0)
MPV: 10 fL (ref 7.5–12.5)
PLATELETS: 215 10*3/uL (ref 140–400)
RBC: 3.92 MIL/uL — AB (ref 4.20–5.80)
RDW: 15 % (ref 11.0–15.0)
WBC: 7.7 10*3/uL (ref 3.8–10.8)

## 2015-09-28 LAB — BASIC METABOLIC PANEL
BUN: 28 mg/dL — ABNORMAL HIGH (ref 7–25)
CALCIUM: 9.3 mg/dL (ref 8.6–10.3)
CO2: 27 mmol/L (ref 20–31)
CREATININE: 1.55 mg/dL — AB (ref 0.70–1.18)
Chloride: 101 mmol/L (ref 98–110)
Glucose, Bld: 116 mg/dL — ABNORMAL HIGH (ref 65–99)
Potassium: 3.9 mmol/L (ref 3.5–5.3)
Sodium: 140 mmol/L (ref 135–146)

## 2015-09-28 NOTE — Progress Notes (Signed)
HPI: FU atrial fibrillation; previously followed by [name].   He has a past history of paroxysmal atrial fibrillation and tachycardia mediated cardiomyopathy.He underwent mapping and ablation of atrial fib in 11/2012 by Dr. [name]. He had early recurrence of atrial fib in 12/2012 requiring repeat DCCV. Dr. [name] restarted amiodarone 02/2013 to try and achieve and maintain NSR to allow the heart to remodel. In 09/2013 amiodarone was decreased and he was changed to [name] (edoxaban). Amiodarone was stopped in 01/2014 due to maintenance of NSR.   However, he was seen on 12/22/14 at which time he came in because of a work in visit for recurrent atrial fibrillation. He was restarted on amiodarone 200 mg twice a day. Subsequently converted back to normal sinus rhythm on amiodarone. The patient has developed some increased tremors of his hands from the amiodarone. He also had this side effect when amiodarone was used previously. Tremor has improved since we cut back on his amiodarone dose to just 100 mg daily. Last echocardiogram August 2015 showed normal LV systolic function, grade 2 diastolic dysfunction, mild mitral regurgitation and moderate left atrial enlargement. Since last seen, Patient denies dyspnea, chest pain, palpitations or syncope.  Current Outpatient Prescriptions  Medication Sig Dispense Refill  . amiodarone (PACERONE) 200 MG tablet Take 100 mg by mouth every morning.     Marland Kitchen apixaban (ELIQUIS) 5 MG TABS tablet Take 1 tablet (5 mg total) by mouth 2 (two) times daily. 60 tablet 6  . Ascorbic Acid (VITAMIN C) 500 MG tablet Take 500-1,000 mg by mouth 2 (two) times daily. Pt takes two tablets in the morning and one tablet in the evening.    . Cholecalciferol (VITAMIN D) 2000 UNITS CAPS Take 4,000 Units by mouth every morning.     . Coenzyme Q10 (COQ10 PO) Take 100 mg by mouth every morning.     . enalapril (VASOTEC) 20 MG tablet TAKE ONE-HALF TABLET BY MOUTH ONCE DAILY IN THE  EVENING 45 tablet 3  . ferrous sulfate 325 (65 FE) MG tablet Take 1 tablet (325 mg total) by mouth daily with breakfast. 30 tablet 3  . furosemide (LASIX) 40 MG tablet Take 60 mg by mouth every morning.    Marland Kitchen guaiFENesin (MUCINEX) 600 MG 12 hr tablet Take 600 mg by mouth 2 (two) times daily as needed for congestion.    Marland Kitchen levothyroxine (SYNTHROID, LEVOTHROID) 50 MCG tablet Take 50 mcg by mouth daily before breakfast.    . magnesium oxide (MAG-OX) 400 MG tablet Take 400 mg by mouth daily at 12 noon.    . metolazone (ZAROXOLYN) 5 MG tablet Take 1 tablet (5 mg total) by mouth daily as needed (fluid retention). 15 tablet 1  . metoprolol succinate (TOPROL-XL) 50 MG 24 hr tablet Take 25-50 mg by mouth 2 (two) times daily. Take 1 tablet in the morning and 1/2 tablet in the evening    . Misc Natural Products (OSTEO BI-FLEX JOINT SHIELD PO) Take 2 tablets by mouth every morning.     . Multiple Vitamin (MULTIVITAMIN) tablet Take 1 tablet by mouth every morning.     . Omega-3 Fatty Acids (FISH OIL) 1000 MG CAPS Take 1 capsule by mouth 2 (two) times daily.     Marland Kitchen omeprazole (PRILOSEC) 20 MG capsule Take 20 mg by mouth daily with lunch.    . polyvinyl alcohol (LIQUIFILM TEARS) 1.4 % ophthalmic solution Place 1 drop into both eyes daily as needed for dry eyes.    Marland Kitchen  zinc gluconate 50 MG tablet Take 50 mg by mouth every morning.     . edoxaban ([name]) 60 MG TABS tablet Take 60 mg by mouth daily. 30 tablet 6   No current facility-administered medications for this visit.     Past Medical History  Diagnosis Date  . Paroxysmal atrial fibrillation (HCC)     a. H/o difficult to control. b. s/p PVI ablation 12-03-2012 by [name] with recurrence afterwards, requiring period of amiodarone.  . Peripheral edema   . Mitral regurgitation     a. Mild by echo in 01/2014.  Marland Kitchen Atrial dilatation     mild biatrial dilitation  . Nonischemic cardiomyopathy (HCC)     a. presumed to be tachycardia mediated. Varying EFs over  the years from 35-40% to normal and back.  . Hypertension   . Hypercholesterolemia   . CKD (chronic kidney disease), stage III   . GERD (gastroesophageal reflux disease)   . H/O hiatal hernia   . Arthritis   . Tear of medial meniscus of left knee   . Bruises easily   . Hypothyroidism   . Sleep apnea     USES C PAP  . Hyperglycemia   . Tremor   . CHF (congestive heart failure) (HCC)   . Dysrhythmia     atrial fibrillation  . Coronary artery disease     Past Surgical History  Procedure Laterality Date  . Lithotripsy      x 2  . Joint replacement  2005    total right hip replacement  . Hernia repair  03/2005  . Kidney stent  1987, 02/2005  . Quadrec  1999    LFT leg quad sx  . Cardioversion N/A 09/04/2012    Procedure: CARDIOVERSION;  Surgeon: [name], MD;  Location: [facility] ENDOSCOPY;  Service: Cardiovascular;  Laterality: N/A;  . Tee without cardioversion N/A 12/02/2012    Procedure: TRANSESOPHAGEAL ECHOCARDIOGRAM (TEE);  Surgeon: [name], MD;  Location: [facility] ENDOSCOPY;  Service: Cardiovascular;  Laterality: N/A;  . Cardioversion N/A 02/11/2013    Procedure: CARDIOVERSION;  Surgeon: [name], MD;  Location: [facility] ENDOSCOPY;  Service: Cardiovascular;  Laterality: N/A;  . Knee arthroscopy with medial menisectomy Left 11/20/2013    Procedure: LEFT KNEE ARTHROSCOPY WITH MEDIAL MENISECTOMY;  Surgeon: Jacki Cones, MD;  Location: [facility] ORS;  Service: Orthopedics;  Laterality: Left;  Microfracture of medial femoral chondyle and abrasion chondroplasty of the medial femoral chondyle  . Knee arthroscopy with drilling/microfracture Left 11/20/2013    Procedure: KNEE ARTHROSCOPY WITH DRILLING/MICROFRACTURE;  Surgeon: Jacki Cones, MD;  Location: [facility] ORS;  Service: Orthopedics;  Laterality: Left;  . Atrial fibrillation ablation N/A 12/03/2012    Procedure: ATRIAL FIBRILLATION ABLATION;  Surgeon: Hillis Range, MD;  Location: [facility] CATH LAB;  Service: Cardiovascular;  Laterality: N/A;    . Right hip surgery  2007    irrigation and debridement from bacteria  . Cardiac catheterization    . Atrial fibrillation ablation      PVI by [name]  . Eye surgery  [phone]    bilateral cataract surgery with lens implants  . Picc line placement  2007  . Kidney stone surgery      2 stents placed  . Total hip arthroplasty Left 08/12/2015    Procedure: LEFT TOTAL HIP ARTHROPLASTY;  Surgeon: Ranee Gosselin, MD;  Location: [facility] ORS;  Service: Orthopedics;  Laterality: Left;    Social History   Social History  . Marital Status: Married  Spouse Name: N/A  . Number of Children: N/A  . Years of Education: N/A   Occupational History  . Systems developer    Social History Main Topics  . Smoking status: Never Smoker   . Smokeless tobacco: Never Used  . Alcohol Use: No  . Drug Use: No  . Sexual Activity: Not on file   Other Topics Concern  . Not on file   Social History Narrative   Lives in [place] with spouse.  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   [employer]    Family History  Problem Relation Age of Onset  . Cancer Mother 29    CANCER  . Heart disease Father 43    HEART PROBLEMS  . Lupus Brother     "of the skin"    ROS: Problems with back pain but no fevers or chills, productive cough, hemoptysis, dysphasia, odynophagia, melena, hematochezia, dysuria, hematuria, rash, seizure activity, orthopnea, PND, pedal edema, claudication. Remaining systems are negative.  Physical Exam: Well-developed well-nourished in no acute distress.  Skin is warm and dry.  HEENT is normal.  Neck is supple. Positive bruit Chest is clear to auscultation with normal expansion.  Cardiovascular exam is regular rate and rhythm.  Abdominal exam nontender or distended. No masses palpated. Extremities show no edema. neuro grossly intact  ECG 07/30/2015-sinus rhythm with nonspecific ST changes.

## 2015-10-01 ENCOUNTER — Encounter: Payer: Self-pay | Admitting: Cardiology

## 2015-10-01 ENCOUNTER — Ambulatory Visit (INDEPENDENT_AMBULATORY_CARE_PROVIDER_SITE_OTHER): Payer: Medicare Other | Admitting: Cardiology

## 2015-10-01 VITALS — BP 164/68 | HR 78 | Ht 71.0 in | Wt 203.4 lb

## 2015-10-01 DIAGNOSIS — I6523 Occlusion and stenosis of bilateral carotid arteries: Secondary | ICD-10-CM

## 2015-10-01 DIAGNOSIS — N183 Chronic kidney disease, stage 3 unspecified: Secondary | ICD-10-CM

## 2015-10-01 DIAGNOSIS — R0989 Other specified symptoms and signs involving the circulatory and respiratory systems: Secondary | ICD-10-CM | POA: Diagnosis not present

## 2015-10-01 DIAGNOSIS — I4891 Unspecified atrial fibrillation: Secondary | ICD-10-CM | POA: Diagnosis not present

## 2015-10-01 DIAGNOSIS — I10 Essential (primary) hypertension: Secondary | ICD-10-CM

## 2015-10-01 LAB — HEPATIC FUNCTION PANEL
ALT: 13 U/L (ref 9–46)
AST: 21 U/L (ref 10–35)
Albumin: 4.5 g/dL (ref 3.6–5.1)
Alkaline Phosphatase: 58 U/L (ref 40–115)
BILIRUBIN DIRECT: 0.1 mg/dL (ref <=0.2)
BILIRUBIN TOTAL: 0.7 mg/dL (ref 0.2–1.2)
Indirect Bilirubin: 0.6 mg/dL (ref 0.2–1.2)
Total Protein: 7.2 g/dL (ref 6.1–8.1)

## 2015-10-01 MED ORDER — EDOXABAN TOSYLATE 60 MG PO TABS: 60.0000 mg | ORAL_TABLET | Freq: Every day | ORAL | Status: DC

## 2015-10-01 MED ORDER — LEVOTHYROXINE SODIUM 50 MCG PO TABS: 50.0000 ug | ORAL_TABLET | Freq: Every day | ORAL | Status: DC

## 2015-10-01 MED ORDER — METOPROLOL SUCCINATE ER 50 MG PO TB24: 25.0000 mg | ORAL_TABLET | Freq: Two times a day (BID) | ORAL | Status: DC

## 2015-10-01 NOTE — Assessment & Plan Note (Signed)
Schedule carotid Dopplers. 

## 2015-10-01 NOTE — Patient Instructions (Addendum)
Medication Instructions:   [name] 60 MG ONCE DAILY  STOP MOBIC  USE TYLENOL FOR PAIN   Labwork:  Your physician recommends that you HAVE LAB WORK TODAY  Testing/Procedures:  A chest x-ray takes a picture of the organs and structures inside the chest, including the heart, lungs, and blood vessels. This test can show several things, including, whether the heart is enlarges; whether fluid is building up in the lungs; and whether pacemaker / defibrillator leads are still in place. AT [facility]  Your physician has requested that you have a carotid duplex. This test is an ultrasound of the carotid arteries in your neck. It looks at blood flow through these arteries that supply the brain with blood. Allow one hour for this exam. There are no restrictions or special instructions.   Follow-Up:  Your physician wants you to follow-up in: 6 MONTHS WITH DR Jens Som You will receive a reminder letter in the mail two months in advance. If you don't receive a letter, please call our office to schedule the follow-up appointment.   If you need a refill on your cardiac medications before your next appointment, please call your pharmacy.

## 2015-10-01 NOTE — Assessment & Plan Note (Addendum)
Patient remains in Sinus rhythm on examination. Continue amiodarone. Check liver functions and chest x-ray. He had TSH checked recently. Embolic risk factors include hypertension and age greater than 76. CHADSvasc 3. GFR 51. He cannot afford apixaban; change to sivaysa 60 mg daily. Note approximately 20 minutes spent reviewing records prior to patient arrival today.

## 2015-10-01 NOTE — Assessment & Plan Note (Signed)
Blood pressure is elevated but he states typically controlled. Continue present medications and follow.

## 2015-10-01 NOTE — Assessment & Plan Note (Signed)
Patient has renal insufficiency. I've asked him to establish with a primary care physician. I have also discontinued mobic and asked him to try Tylenol for his back pain.

## 2015-10-04 ENCOUNTER — Telehealth: Payer: Self-pay | Admitting: *Deleted

## 2015-10-04 ENCOUNTER — Ambulatory Visit (HOSPITAL_COMMUNITY)
Admission: RE | Admit: 2015-10-04 | Discharge: 2015-10-04 | Disposition: A | Discharge status: Home / Self Care | Payer: Medicare Other | Source: Ambulatory Visit | Attending: Cardiology | Admitting: Cardiology

## 2015-10-04 ENCOUNTER — Other Ambulatory Visit: Payer: Self-pay | Admitting: *Deleted

## 2015-10-04 DIAGNOSIS — J929 Pleural plaque without asbestos: Secondary | ICD-10-CM | POA: Diagnosis not present

## 2015-10-04 DIAGNOSIS — I4891 Unspecified atrial fibrillation: Secondary | ICD-10-CM | POA: Diagnosis not present

## 2015-10-04 DIAGNOSIS — I509 Heart failure, unspecified: Secondary | ICD-10-CM | POA: Diagnosis not present

## 2015-10-04 MED ORDER — METOPROLOL SUCCINATE ER 50 MG PO TB24: ORAL_TABLET | ORAL | Status: DC

## 2015-10-04 NOTE — Telephone Encounter (Signed)
Pt should continue metoprolol dose that he was taking prior to recent ov, appears to be 50 mg q am and 25 mg q pm but please confirm with pt Olga Millers

## 2015-10-04 NOTE — Telephone Encounter (Signed)
Patient's wife calling to see what dosage husband is suppose to take of his Metoprolol XL. The rx sent states take 50mg  twice daily, take 50mg  in the and 25mg  in the evening. Patient says pharmacy has called [facility] and [facility] called her. She is now trying to reach [place]. I let her know that I will sent a message to both Dr. Jens Som and [facility]ney because the refill department is located at a different site. She states she wants a call very soon so she can get this prescription now. I let her know I will send this message and [facility]ney will call her at her earliest convenience. Wife states, " I guess we will have to suffer consequences until I get a call back.Thanks" then hung up.

## 2015-10-05 ENCOUNTER — Other Ambulatory Visit: Payer: Self-pay | Admitting: *Deleted

## 2015-10-05 MED ORDER — METOPROLOL SUCCINATE ER 50 MG PO TB24: ORAL_TABLET | ORAL | Status: DC

## 2015-10-05 NOTE — Telephone Encounter (Signed)
metoprolol succinate (TOPROL-XL) 50 MG 24 hr tablet Take 25-50 mg by mouth 2 (two) times daily. Take 1 tablet in the morning and 1/2 tablet in the evening         LM for patient's wife to return call - above documentation of 50mg  QAM and 25mg  QHS is correct per last OV On 10/01/15

## 2015-10-08 ENCOUNTER — Ambulatory Visit (HOSPITAL_COMMUNITY)
Admission: RE | Admit: 2015-10-08 | Discharge: 2015-10-08 | Disposition: A | Discharge status: Home / Self Care | Payer: Medicare Other | Source: Ambulatory Visit | Attending: Cardiovascular Disease | Admitting: Cardiovascular Disease

## 2015-10-08 DIAGNOSIS — I509 Heart failure, unspecified: Secondary | ICD-10-CM | POA: Diagnosis not present

## 2015-10-08 DIAGNOSIS — K219 Gastro-esophageal reflux disease without esophagitis: Secondary | ICD-10-CM | POA: Insufficient documentation

## 2015-10-08 DIAGNOSIS — I6523 Occlusion and stenosis of bilateral carotid arteries: Secondary | ICD-10-CM | POA: Insufficient documentation

## 2015-10-08 DIAGNOSIS — E7800 Pure hypercholesterolemia, unspecified: Secondary | ICD-10-CM | POA: Diagnosis not present

## 2015-10-08 DIAGNOSIS — N183 Chronic kidney disease, stage 3 (moderate): Secondary | ICD-10-CM | POA: Diagnosis not present

## 2015-10-08 DIAGNOSIS — I130 Hypertensive heart and chronic kidney disease with heart failure and stage 1 through stage 4 chronic kidney disease, or unspecified chronic kidney disease: Secondary | ICD-10-CM | POA: Insufficient documentation

## 2015-10-08 DIAGNOSIS — I429 Cardiomyopathy, unspecified: Secondary | ICD-10-CM | POA: Diagnosis not present

## 2015-11-04 DIAGNOSIS — M5136 Other intervertebral disc degeneration, lumbar region: Secondary | ICD-10-CM | POA: Diagnosis not present

## 2015-11-29 ENCOUNTER — Telehealth: Payer: Self-pay | Admitting: Cardiology

## 2015-11-29 NOTE — Telephone Encounter (Signed)
New Message  Pt experiencing irregular heartbeat  Patient c/o Palpitations:  High priority if patient c/o lightheadedness and shortness of breath.  1. How long have you been having palpitations? Started 5/31    2. Are you currently experiencing lightheadedness and shortness of breath? no  3. Have you checked your BP and heart rate? (document readings) last reading 10:30am  99/65 pulse 109  9;30 143/86 pulse 99  4. Are you experiencing any other symptoms? No

## 2015-11-29 NOTE — Telephone Encounter (Signed)
Spoke to patient. Has A Fib history, s/p ablation by Dr. [name] 11/2012 DCCV 1 yr ago. Last occurrence of A Fib was at that time.  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 The symptoms lasted about 24 hrs and patient notes he converted out on his own. He then went back into A Fib on Friday and has been in it since.  He reports palpitations but denies other symptoms. - "not really" short of breath. Denies dizziness or fatigue.  Pt reports HR ranging from 65-128. Reports BP readings between 102-129/62-86. He had a single isolated low BP on 5/30 of 98/42 which he felt may not have been accurate due to the BP cuff used (has both a wrist and upper arm cuff)  Advised based on this, OK to do an additional metoprolol 25mg  this afternoon and take all other meds as scheduled. Discussed w/ Ventura Sellers RN. Pt to follow up tomorrow at 10am w Lupita Leash for A Fib clinic appt. Instructions for reporting to AF clinic given. Contact information given. Advised to keep this appt even if he feels the A Fib resolves, but if he has increased SOB or new fatigue, dizziness, or chest pressure to go to ED. Pt voiced understanding of all instructions and thanks.

## 2015-11-30 ENCOUNTER — Encounter (HOSPITAL_COMMUNITY): Payer: Self-pay | Admitting: Nurse Practitioner

## 2015-11-30 ENCOUNTER — Ambulatory Visit (HOSPITAL_COMMUNITY)
Admission: RE | Admit: 2015-11-30 | Discharge: 2015-11-30 | Disposition: A | Discharge status: Home / Self Care | Payer: Medicare Other | Source: Ambulatory Visit | Attending: Nurse Practitioner | Admitting: Nurse Practitioner

## 2015-11-30 VITALS — BP 110/70 | HR 116 | Ht 71.0 in | Wt 200.4 lb

## 2015-11-30 DIAGNOSIS — E7800 Pure hypercholesterolemia, unspecified: Secondary | ICD-10-CM | POA: Insufficient documentation

## 2015-11-30 DIAGNOSIS — E039 Hypothyroidism, unspecified: Secondary | ICD-10-CM | POA: Insufficient documentation

## 2015-11-30 DIAGNOSIS — Z881 Allergy status to other antibiotic agents status: Secondary | ICD-10-CM | POA: Insufficient documentation

## 2015-11-30 DIAGNOSIS — Z8249 Family history of ischemic heart disease and other diseases of the circulatory system: Secondary | ICD-10-CM | POA: Insufficient documentation

## 2015-11-30 DIAGNOSIS — I2510 Atherosclerotic heart disease of native coronary artery without angina pectoris: Secondary | ICD-10-CM | POA: Diagnosis not present

## 2015-11-30 DIAGNOSIS — Z888 Allergy status to other drugs, medicaments and biological substances status: Secondary | ICD-10-CM | POA: Diagnosis not present

## 2015-11-30 DIAGNOSIS — N183 Chronic kidney disease, stage 3 (moderate): Secondary | ICD-10-CM | POA: Diagnosis not present

## 2015-11-30 DIAGNOSIS — Z7901 Long term (current) use of anticoagulants: Secondary | ICD-10-CM | POA: Diagnosis not present

## 2015-11-30 DIAGNOSIS — I480 Paroxysmal atrial fibrillation: Secondary | ICD-10-CM

## 2015-11-30 DIAGNOSIS — K219 Gastro-esophageal reflux disease without esophagitis: Secondary | ICD-10-CM | POA: Diagnosis not present

## 2015-11-30 DIAGNOSIS — Z79899 Other long term (current) drug therapy: Secondary | ICD-10-CM | POA: Insufficient documentation

## 2015-11-30 DIAGNOSIS — Z880 Allergy status to penicillin: Secondary | ICD-10-CM | POA: Diagnosis not present

## 2015-11-30 DIAGNOSIS — G4730 Sleep apnea, unspecified: Secondary | ICD-10-CM | POA: Insufficient documentation

## 2015-11-30 DIAGNOSIS — I340 Nonrheumatic mitral (valve) insufficiency: Secondary | ICD-10-CM | POA: Insufficient documentation

## 2015-11-30 DIAGNOSIS — I509 Heart failure, unspecified: Secondary | ICD-10-CM | POA: Insufficient documentation

## 2015-11-30 DIAGNOSIS — I130 Hypertensive heart and chronic kidney disease with heart failure and stage 1 through stage 4 chronic kidney disease, or unspecified chronic kidney disease: Secondary | ICD-10-CM | POA: Diagnosis not present

## 2015-11-30 MED ORDER — AMIODARONE HCL 200 MG PO TABS
200.0000 mg | ORAL_TABLET | Freq: Two times a day (BID) | ORAL | Status: DC
Start: 2015-11-30 — End: 2015-12-07

## 2015-11-30 NOTE — Progress Notes (Signed)
Patient ID: Charles [name], male   DOB: [birth year], 75 y.o.   MRN: [id number]     Primary Care Physician: [name], MD Referring Physician:[facility] tri[facility] Cardiologist: Dr. Lenard Simmer is a 75 y.o. male with a h/o PAF on amiodarone at 100 mg a day. He has PAF Wednesday thru Friday, but had had persistent afib since Friday pm. In the past amiodarone was increased for a few days and he chemically converted. He has had prior cardioversion's x 4, in 2012, 2014, and 2 in 2014 around the time of an Afib ablation. He does have benign essential tremors which the amiodarone exaggerates, so when in SR the dose is kept low at 100 mg a day. The pt does not feel the afib, knows only by the elevated heart rate on the monitor when he checks his BP. He has not missed any doses of DOAC. Chadsvasc score of at least 4. No known triggers.  Today, he denies symptoms of palpitations, chest pain, shortness of breath, orthopnea, PND, lower extremity edema, dizziness, presyncope, syncope, or neurologic sequela. The patient is tolerating medications without difficulties and is otherwise without complaint today.   Past Medical History  Diagnosis Date  . Paroxysmal atrial fibrillation (HCC)     a. H/o difficult to control. b. s/p PVI ablation 12-03-2012 by [name] with recurrence afterwards, requiring period of amiodarone.  . Peripheral edema   . Mitral regurgitation     a. Mild by echo in 01/2014.  Marland Kitchen Atrial dilatation     mild biatrial dilitation  . Nonischemic cardiomyopathy (HCC)     a. presumed to be tachycardia mediated. Varying EFs over the years from 35-40% to normal and back.  . Hypertension   . Hypercholesterolemia   . CKD (chronic kidney disease), stage III   . GERD (gastroesophageal reflux disease)   . H/O hiatal hernia   . Arthritis   . Tear of medial meniscus of left knee   . Bruises easily   . Hypothyroidism   . Sleep apnea     USES C PAP  . Hyperglycemia   . Tremor     . CHF (congestive heart failure) (HCC)   . Dysrhythmia     atrial fibrillation  . Coronary artery disease    Past Surgical History  Procedure Laterality Date  . Lithotripsy      x 2  . Joint replacement  2005    total right hip replacement  . Hernia repair  03/2005  . Kidney stent  1987, 02/2005  . Quadrec  1999    LFT leg quad sx  . Cardioversion N/A 09/04/2012    Procedure: CARDIOVERSION;  Surgeon: [name], MD;  Location: [facility] ENDOSCOPY;  Service: Cardiovascular;  Laterality: N/A;  . Tee without cardioversion N/A 12/02/2012    Procedure: TRANSESOPHAGEAL ECHOCARDIOGRAM (TEE);  Surgeon: [name], MD;  Location: [facility] ENDOSCOPY;  Service: Cardiovascular;  Laterality: N/A;  . Cardioversion N/A 02/11/2013    Procedure: CARDIOVERSION;  Surgeon: [name], MD;  Location: [facility] ENDOSCOPY;  Service: Cardiovascular;  Laterality: N/A;  . Knee arthroscopy with medial menisectomy Left 11/20/2013    Procedure: LEFT KNEE ARTHROSCOPY WITH MEDIAL MENISECTOMY;  Surgeon: Jacki Cones, MD;  Location: [facility] ORS;  Service: Orthopedics;  Laterality: Left;  Microfracture of medial femoral chondyle and abrasion chondroplasty of the medial femoral chondyle  . Knee arthroscopy with drilling/microfracture Left 11/20/2013    Procedure: KNEE ARTHROSCOPY WITH DRILLING/MICROFRACTURE;  Surgeon: Jacki Cones, MD;  Location: [facility] ORS;  Service: Orthopedics;  Laterality: Left;  . Atrial fibrillation ablation N/A 12/03/2012    Procedure: ATRIAL FIBRILLATION ABLATION;  Surgeon: Hillis Range, MD;  Location: [place] CATH LAB;  Service: Cardiovascular;  Laterality: N/A;  . Right hip surgery  2007    irrigation and debridement from bacteria  . Cardiac catheterization    . Atrial fibrillation ablation      PVI by [name]  . Eye surgery  [phone]    bilateral cataract surgery with lens implants  . Picc line placement  2007  . Kidney stone surgery      2 stents placed  . Total hip arthroplasty Left 08/12/2015     Procedure: LEFT TOTAL HIP ARTHROPLASTY;  Surgeon: Ranee Gosselin, MD;  Location: [facility] ORS;  Service: Orthopedics;  Laterality: Left;    Current Outpatient Prescriptions  Medication Sig Dispense Refill  . amiodarone (PACERONE) 200 MG tablet Take 1 tablet (200 mg total) by mouth 2 (two) times daily.    . Ascorbic Acid (VITAMIN C) 500 MG tablet Take 500-1,000 mg by mouth 2 (two) times daily. Pt takes two tablets in the morning and one tablet in the evening.    . Cholecalciferol (VITAMIN D) 2000 UNITS CAPS Take 4,000 Units by mouth every morning.     . Coenzyme Q10 (COQ10 PO) Take 100 mg by mouth every morning.     . edoxaban ([name]) 60 MG TABS tablet Take 60 mg by mouth daily. 30 tablet 6  . enalapril (VASOTEC) 20 MG tablet TAKE ONE-HALF TABLET BY MOUTH ONCE DAILY IN THE EVENING 45 tablet 3  . ferrous sulfate 325 (65 FE) MG tablet Take 1 tablet (325 mg total) by mouth daily with breakfast. 30 tablet 3  . furosemide (LASIX) 40 MG tablet Take 60 mg by mouth every morning.    Marland Kitchen guaiFENesin (MUCINEX) 600 MG 12 hr tablet Take 600 mg by mouth 2 (two) times daily as needed for congestion.    Marland Kitchen levothyroxine (SYNTHROID, LEVOTHROID) 50 MCG tablet Take 1 tablet (50 mcg total) by mouth daily before breakfast. 90 tablet 3  . magnesium oxide (MAG-OX) 400 MG tablet Take 400 mg by mouth daily at 12 noon.    . metolazone (ZAROXOLYN) 5 MG tablet Take 1 tablet (5 mg total) by mouth daily as needed (fluid retention). 15 tablet 1  . metoprolol succinate (TOPROL-XL) 50 MG 24 hr tablet Take 1 tablet in the morning and 1/2 tablet in the evening. 135 tablet 3  . Misc Natural Products (OSTEO BI-FLEX JOINT SHIELD PO) Take 2 tablets by mouth every morning.     . Multiple Vitamin (MULTIVITAMIN) tablet Take 1 tablet by mouth every morning.     . Omega-3 Fatty Acids (FISH OIL) 1000 MG CAPS Take 1 capsule by mouth 2 (two) times daily.     Marland Kitchen omeprazole (PRILOSEC) 20 MG capsule Take 20 mg by mouth every other day.     . polyvinyl  alcohol (LIQUIFILM TEARS) 1.4 % ophthalmic solution Place 1 drop into both eyes daily as needed for dry eyes.    Marland Kitchen zinc gluconate 50 MG tablet Take 50 mg by mouth every morning.      No current facility-administered medications for this encounter.    Allergies  Allergen Reactions  . Ceftriaxone Sodium Rash    Rocephin  . Diltiazem Hcl Rash    Cardizem  . Penicillins Rash    Social History   Social History  . Marital Status: Married  Spouse Name: N/A  . Number of Children: N/A  . Years of Education: N/A   Occupational History  . Systems developer    Social History Main Topics  . Smoking status: Never Smoker   . Smokeless tobacco: Never Used  . Alcohol Use: No  . Drug Use: No  . Sexual Activity: Not on file   Other Topics Concern  . Not on file   Social History Narrative   Lives in [place] with spouse.  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   [employer]    Family History  Problem Relation Age of Onset  . Cancer Mother 57    CANCER  . Heart disease Father 9    HEART PROBLEMS  . Lupus Brother     "of the skin"    ROS- All systems are reviewed and negative except as per the HPI above  Physical Exam: Filed Vitals:   11/30/15 0945  BP: 110/70  Pulse: 116  Height: 5\' 11"  (1.803 m)  Weight: 200 lb 6.4 oz (90.901 kg)    GEN- The patient is well appearing, alert and oriented x 3 today.   Head- normocephalic, atraumatic Eyes-  Sclera clear, conjunctiva pink Ears- hearing intact Oropharynx- clear Neck- supple, no JVP Lymph- no cervical lymphadenopathy Lungs- Clear to ausculation bilaterally, normal work of breathing Heart- irregular rate and rhythm, no murmurs, rubs or gallops, PMI not laterally displaced GI- soft, NT, ND, + BS Extremities- no clubbing, cyanosis, or edema MS- no significant deformity or atrophy Skin- no rash or lesion Psych- euthymic mood, full affect Neuro- strength and sensation are  intact  EKG-afib with rvr, 116 bpm, qrs int 98 ms, qtc 450 ms Epic records reviewed  Assessment and Plan: 1. PAF Increase amiodarone to 200 mg bid Return on Monday and if still in afib will set up for cardioversion Continue edoxaban 60 mg daily without missed doses  Lupita Leash C[facility] [address] [place], [place] [address] [phone]

## 2015-11-30 NOTE — Patient Instructions (Signed)
Your physician has recommended you make the following change in your medication:  1)Increase Amiodarone to 200mg twice a day  

## 2015-12-06 ENCOUNTER — Encounter (HOSPITAL_COMMUNITY): Payer: Self-pay | Admitting: Nurse Practitioner

## 2015-12-06 ENCOUNTER — Ambulatory Visit (HOSPITAL_COMMUNITY)
Admission: RE | Admit: 2015-12-06 | Discharge: 2015-12-06 | Disposition: A | Discharge status: Home / Self Care | Payer: Medicare Other | Source: Ambulatory Visit | Attending: Nurse Practitioner | Admitting: Nurse Practitioner

## 2015-12-06 VITALS — BP 110/68 | HR 117 | Ht 71.0 in | Wt 201.0 lb

## 2015-12-06 DIAGNOSIS — Z79899 Other long term (current) drug therapy: Secondary | ICD-10-CM | POA: Diagnosis not present

## 2015-12-06 DIAGNOSIS — I340 Nonrheumatic mitral (valve) insufficiency: Secondary | ICD-10-CM | POA: Diagnosis not present

## 2015-12-06 DIAGNOSIS — Z7901 Long term (current) use of anticoagulants: Secondary | ICD-10-CM | POA: Diagnosis not present

## 2015-12-06 DIAGNOSIS — Z880 Allergy status to penicillin: Secondary | ICD-10-CM | POA: Diagnosis not present

## 2015-12-06 DIAGNOSIS — G4730 Sleep apnea, unspecified: Secondary | ICD-10-CM | POA: Diagnosis not present

## 2015-12-06 DIAGNOSIS — Z9889 Other specified postprocedural states: Secondary | ICD-10-CM | POA: Insufficient documentation

## 2015-12-06 DIAGNOSIS — Z8249 Family history of ischemic heart disease and other diseases of the circulatory system: Secondary | ICD-10-CM | POA: Diagnosis not present

## 2015-12-06 DIAGNOSIS — I429 Cardiomyopathy, unspecified: Secondary | ICD-10-CM | POA: Insufficient documentation

## 2015-12-06 DIAGNOSIS — M1990 Unspecified osteoarthritis, unspecified site: Secondary | ICD-10-CM | POA: Insufficient documentation

## 2015-12-06 DIAGNOSIS — Z96641 Presence of right artificial hip joint: Secondary | ICD-10-CM | POA: Insufficient documentation

## 2015-12-06 DIAGNOSIS — Z888 Allergy status to other drugs, medicaments and biological substances status: Secondary | ICD-10-CM | POA: Insufficient documentation

## 2015-12-06 DIAGNOSIS — E7800 Pure hypercholesterolemia, unspecified: Secondary | ICD-10-CM | POA: Insufficient documentation

## 2015-12-06 DIAGNOSIS — K449 Diaphragmatic hernia without obstruction or gangrene: Secondary | ICD-10-CM | POA: Insufficient documentation

## 2015-12-06 DIAGNOSIS — I130 Hypertensive heart and chronic kidney disease with heart failure and stage 1 through stage 4 chronic kidney disease, or unspecified chronic kidney disease: Secondary | ICD-10-CM | POA: Insufficient documentation

## 2015-12-06 DIAGNOSIS — I509 Heart failure, unspecified: Secondary | ICD-10-CM | POA: Diagnosis not present

## 2015-12-06 DIAGNOSIS — I4819 Other persistent atrial fibrillation: Secondary | ICD-10-CM

## 2015-12-06 DIAGNOSIS — E039 Hypothyroidism, unspecified: Secondary | ICD-10-CM | POA: Diagnosis not present

## 2015-12-06 DIAGNOSIS — Z96652 Presence of left artificial knee joint: Secondary | ICD-10-CM | POA: Insufficient documentation

## 2015-12-06 DIAGNOSIS — K219 Gastro-esophageal reflux disease without esophagitis: Secondary | ICD-10-CM | POA: Diagnosis not present

## 2015-12-06 DIAGNOSIS — Z809 Family history of malignant neoplasm, unspecified: Secondary | ICD-10-CM | POA: Insufficient documentation

## 2015-12-06 DIAGNOSIS — Z8489 Family history of other specified conditions: Secondary | ICD-10-CM | POA: Diagnosis not present

## 2015-12-06 DIAGNOSIS — I481 Persistent atrial fibrillation: Secondary | ICD-10-CM | POA: Diagnosis not present

## 2015-12-06 DIAGNOSIS — I2510 Atherosclerotic heart disease of native coronary artery without angina pectoris: Secondary | ICD-10-CM | POA: Diagnosis not present

## 2015-12-06 DIAGNOSIS — I4891 Unspecified atrial fibrillation: Secondary | ICD-10-CM | POA: Diagnosis present

## 2015-12-06 LAB — CBC
HCT: 40.4 % (ref 39.0–52.0)
HEMOGLOBIN: 13.5 g/dL (ref 13.0–17.0)
MCH: 30.1 pg (ref 26.0–34.0)
MCHC: 33.4 g/dL (ref 30.0–36.0)
MCV: 90.2 fL (ref 78.0–100.0)
Platelets: 195 10*3/uL (ref 150–400)
RBC: 4.48 MIL/uL (ref 4.22–5.81)
RDW: 13.7 % (ref 11.5–15.5)
WBC: 8.4 10*3/uL (ref 4.0–10.5)

## 2015-12-06 LAB — BASIC METABOLIC PANEL
ANION GAP: 8 (ref 5–15)
BUN: 39 mg/dL — ABNORMAL HIGH (ref 6–20)
CALCIUM: 9.8 mg/dL (ref 8.9–10.3)
CHLORIDE: 104 mmol/L (ref 101–111)
CO2: 25 mmol/L (ref 22–32)
CREATININE: 1.91 mg/dL — AB (ref 0.61–1.24)
GFR calc non Af Amer: 32 mL/min — ABNORMAL LOW (ref >60)
GFR, EST AFRICAN AMERICAN: 38 mL/min — AB (ref >60)
Glucose, Bld: 113 mg/dL — ABNORMAL HIGH (ref 65–99)
Potassium: 4.1 mmol/L (ref 3.5–5.1)
SODIUM: 137 mmol/L (ref 135–145)

## 2015-12-06 LAB — TSH: TSH: 3.536 u[IU]/mL (ref 0.350–4.500)

## 2015-12-06 NOTE — Progress Notes (Signed)
Patient ID: Charles [name], male   DOB: [birth year], 75 y.o.   MRN: [id number]      Primary Care Physician: [name], MD Referring Physician: Mylinda Latina Triage Cardiologist: Dr. Lenard Simmer is a 75 y.o. male with a h/o PAF on amiodarone at 100 mg a day. He has PAF Wednesday thru Friday, but had had persistent afib since Friday pm. In the past amiodarone was increased for a few days and he chemically converted. He has had prior cardioversion's x 4, in 2012, 2014, and 2 in 2014 around the time of an Afib ablation. He does have benign essential tremors which the amiodarone exaggerates, so when in SR the dose is kept low at 100 mg a day. The pt does not feel the afib, knows only by the elevated heart rate on the monitor when he checks his BP. He has not missed any doses of DOAC. Chadsvasc score of at least 4. No known triggers.  He returns 6/12, he continues in persistent afib despite increase of amiodarone 200 mg bid. He tolerates well, but is feeling more fatigue.He has not missed any doses of edoxaban. He wishes to pursue DCCV.  Today, he denies symptoms of palpitations, chest pain, shortness of breath, orthopnea, PND, lower extremity edema, dizziness, presyncope, syncope, or neurologic sequela. The patient is tolerating medications without difficulties and is otherwise without complaint today.   Past Medical History  Diagnosis Date  . Paroxysmal atrial fibrillation (HCC)     a. H/o difficult to control. b. s/p PVI ablation 12-03-2012 by [name] with recurrence afterwards, requiring period of amiodarone.  . Peripheral edema   . Mitral regurgitation     a. Mild by echo in 01/2014.  Marland Kitchen Atrial dilatation     mild biatrial dilitation  . Nonischemic cardiomyopathy (HCC)     a. presumed to be tachycardia mediated. Varying EFs over the years from 35-40% to normal and back.  . Hypertension   . Hypercholesterolemia   . CKD (chronic kidney disease), stage III   . GERD  (gastroesophageal reflux disease)   . H/O hiatal hernia   . Arthritis   . Tear of medial meniscus of left knee   . Bruises easily   . Hypothyroidism   . Sleep apnea     USES C PAP  . Hyperglycemia   . Tremor   . CHF (congestive heart failure) (HCC)   . Dysrhythmia     atrial fibrillation  . Coronary artery disease    Past Surgical History  Procedure Laterality Date  . Lithotripsy      x 2  . Joint replacement  2005    total right hip replacement  . Hernia repair  03/2005  . Kidney stent  1987, 02/2005  . Quadrec  1999    LFT leg quad sx  . Cardioversion N/A 09/04/2012    Procedure: CARDIOVERSION;  Surgeon: [name], MD;  Location: [facility] ENDOSCOPY;  Service: Cardiovascular;  Laterality: N/A;  . Tee without cardioversion N/A 12/02/2012    Procedure: TRANSESOPHAGEAL ECHOCARDIOGRAM (TEE);  Surgeon: [name], MD;  Location: [facility] ENDOSCOPY;  Service: Cardiovascular;  Laterality: N/A;  . Cardioversion N/A 02/11/2013    Procedure: CARDIOVERSION;  Surgeon: [name], MD;  Location: [facility] ENDOSCOPY;  Service: Cardiovascular;  Laterality: N/A;  . Knee arthroscopy with medial menisectomy Left 11/20/2013    Procedure: LEFT KNEE ARTHROSCOPY WITH MEDIAL MENISECTOMY;  Surgeon: Jacki Cones, MD;  Location: [facility] ORS;  Service: Orthopedics;  Laterality: Left;  Microfracture of medial femoral chondyle and abrasion chondroplasty of the medial femoral chondyle  . Knee arthroscopy with drilling/microfracture Left 11/20/2013    Procedure: KNEE ARTHROSCOPY WITH DRILLING/MICROFRACTURE;  Surgeon: Jacki Cones, MD;  Location: [facility] ORS;  Service: Orthopedics;  Laterality: Left;  . Atrial fibrillation ablation N/A 12/03/2012    Procedure: ATRIAL FIBRILLATION ABLATION;  Surgeon: Hillis Range, MD;  Location: [facility]  CATH LAB;  Service: Cardiovascular;  Laterality: N/A;  . Right hip surgery  2007    irrigation and debridement from bacteria  . Cardiac catheterization    . Atrial fibrillation ablation       PVI by [name]  . Eye surgery  [id number]    bilateral cataract surgery with lens implants  . Picc line placement  2007  . Kidney stone surgery      2 stents placed  . Total hip arthroplasty Left 08/12/2015    Procedure: LEFT TOTAL HIP ARTHROPLASTY;  Surgeon: Ranee Gosselin, MD;  Location: [facility] ORS;  Service: Orthopedics;  Laterality: Left;    Current Outpatient Prescriptions  Medication Sig Dispense Refill  . acetaminophen (TYLENOL ARTHRITIS PAIN) 650 MG CR tablet Take 650 mg by mouth every 8 (eight) hours as needed for pain (taking 1 each morning).    Marland Kitchen amiodarone (PACERONE) 200 MG tablet Take 1 tablet (200 mg total) by mouth 2 (two) times daily.    . Ascorbic Acid (VITAMIN C) 500 MG tablet Take 500-1,000 mg by mouth 2 (two) times daily. Pt takes two tablets in the morning and one tablet in the evening.    . Cholecalciferol (VITAMIN D) 2000 UNITS CAPS Take 4,000 Units by mouth every morning.     . Coenzyme Q10 (COQ10 PO) Take 100 mg by mouth every morning.     . edoxaban ([name]) 60 MG TABS tablet Take 60 mg by mouth daily. 30 tablet 6  . enalapril (VASOTEC) 20 MG tablet TAKE ONE-HALF TABLET BY MOUTH ONCE DAILY IN THE EVENING 45 tablet 3  . ferrous sulfate 325 (65 FE) MG tablet Take 1 tablet (325 mg total) by mouth daily with breakfast. 30 tablet 3  . furosemide (LASIX) 40 MG tablet Take 60 mg by mouth every morning.    Marland Kitchen levothyroxine (SYNTHROID, LEVOTHROID) 50 MCG tablet Take 1 tablet (50 mcg total) by mouth daily before breakfast. 90 tablet 3  . magnesium oxide (MAG-OX) 400 MG tablet Take 400 mg by mouth daily at 12 noon.    . metolazone (ZAROXOLYN) 5 MG tablet Take 1 tablet (5 mg total) by mouth daily as needed (fluid retention). 15 tablet 1  . metoprolol succinate (TOPROL-XL) 50 MG 24 hr tablet Take 1 tablet in the morning and 1/2 tablet in the evening. 135 tablet 3  . Misc Natural Products (OSTEO BI-FLEX JOINT SHIELD PO) Take 2 tablets by mouth every morning.     . Multiple Vitamin  (MULTIVITAMIN) tablet Take 1 tablet by mouth every morning.     . Omega-3 Fatty Acids (FISH OIL) 1000 MG CAPS Take 1 capsule by mouth 2 (two) times daily.     Marland Kitchen omeprazole (PRILOSEC) 20 MG capsule Take 20 mg by mouth every other day.     . polyvinyl alcohol (LIQUIFILM TEARS) 1.4 % ophthalmic solution Place 1 drop into both eyes daily as needed for dry eyes.    Marland Kitchen zinc gluconate 50 MG tablet Take 50 mg by mouth every morning.     Marland Kitchen guaiFENesin (MUCINEX) 600 MG 12 hr tablet Take 600 mg by  mouth 2 (two) times daily as needed for congestion. Reported on 12/06/2015     No current facility-administered medications for this encounter.    Allergies  Allergen Reactions  . Ceftriaxone Sodium Rash    Rocephin  . Diltiazem Hcl Rash    Cardizem  . Penicillins Rash    Has patient had a PCN reaction causing immediate rash, facial/tongue/throat swelling, SOB or lightheadedness with hypotension: No Has patient had a PCN reaction causing severe rash involving mucus membranes or skin necrosis: No Has patient had a PCN reaction that required hospitalization  Has patient had a PCN reaction occurring within the last 10 years:  If all of the above answers are "NO", then may proceed with Cephalosporin use.     Social History   Social History  . Marital Status: Married    Spouse Name: N/A  . Number of Children: N/A  . Years of Education: N/A   Occupational History  . Systems developer    Social History Main Topics  . Smoking status: Never Smoker   . Smokeless tobacco: Never Used  . Alcohol Use: No  . Drug Use: No  . Sexual Activity: Not on file   Other Topics Concern  . Not on file   Social History Narrative   Lives in [place] with spouse.  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   [employer]    Family History  Problem Relation Age of Onset  . Cancer Mother 50    CANCER  . Heart disease Father 63    HEART PROBLEMS  . Lupus Brother     "of the skin"     ROS- All systems are reviewed and negative except as per the HPI above  Physical Exam: Filed Vitals:   12/06/15 0944  BP: 110/68  Pulse: 117  Height:  (1.803 m)  Weight: 201 lb (91.173 kg)    GEN- The patient is well appearing, alert and oriented x 3 today.   Head- normocephalic, atraumatic Eyes-  Sclera clear, conjunctiva pink Ears- hearing intact Oropharynx- clear Neck- supple, no JVP Lymph- no cervical lymphadenopathy Lungs- Clear to ausculation bilaterally, normal work of breathing Heart- Irregular rate and rhythm, no murmurs, rubs or gallops, PMI not laterally displaced GI- soft, NT, ND, + BS Extremities- no clubbing, cyanosis, or edema MS- no significant deformity or atrophy Skin- no rash or lesion Psych- euthymic mood, full affect Neuro- strength and sensation are intact  EKG- afib at 117 bpm, NST wave abnormality qrs int 92 ms, qtc 474 ms Epic records reviewed  Assessment and Plan: 1. Persistent afib Scheduled for DCCV tomorrow 6/13 No missed doses of DOAC Return to one half tablet of amiodarone 200 mg a day after the cardioversion Cbc,bmet,tsh today  F/u one week s/p cardioversion  Lupita Leash [facility] [address] [place], [place] [address] [phone]

## 2015-12-06 NOTE — Patient Instructions (Addendum)
Cardioversion scheduled for Tuesday, June 13th  - Arrive at the [facility] and go to admitting at [facility]  -Do not eat or drink anything after midnight the night prior to your procedure.  - Take all your medication with a sip of water prior to arrival.  - You will not be able to drive home after your procedure.  Reduce Amiodarone to 200mg  once a day after the cardioversion

## 2015-12-07 ENCOUNTER — Encounter (HOSPITAL_COMMUNITY)
Admission: RE | Disposition: A | Discharge status: Home / Self Care | Payer: Self-pay | Source: Ambulatory Visit | Attending: Internal Medicine

## 2015-12-07 ENCOUNTER — Encounter (HOSPITAL_COMMUNITY): Payer: Self-pay | Admitting: Internal Medicine

## 2015-12-07 ENCOUNTER — Ambulatory Visit (HOSPITAL_COMMUNITY)
Admission: RE | Admit: 2015-12-07 | Discharge: 2015-12-07 | Disposition: A | Discharge status: Home / Self Care | Payer: Medicare Other | Source: Ambulatory Visit | Attending: Internal Medicine | Admitting: Internal Medicine

## 2015-12-07 ENCOUNTER — Ambulatory Visit (HOSPITAL_COMMUNITY): Payer: Medicare Other | Admitting: Certified Registered"

## 2015-12-07 DIAGNOSIS — I480 Paroxysmal atrial fibrillation: Secondary | ICD-10-CM | POA: Insufficient documentation

## 2015-12-07 DIAGNOSIS — I130 Hypertensive heart and chronic kidney disease with heart failure and stage 1 through stage 4 chronic kidney disease, or unspecified chronic kidney disease: Secondary | ICD-10-CM | POA: Insufficient documentation

## 2015-12-07 DIAGNOSIS — Z79899 Other long term (current) drug therapy: Secondary | ICD-10-CM | POA: Insufficient documentation

## 2015-12-07 DIAGNOSIS — N183 Chronic kidney disease, stage 3 (moderate): Secondary | ICD-10-CM | POA: Diagnosis not present

## 2015-12-07 DIAGNOSIS — I509 Heart failure, unspecified: Secondary | ICD-10-CM | POA: Diagnosis not present

## 2015-12-07 DIAGNOSIS — I129 Hypertensive chronic kidney disease with stage 1 through stage 4 chronic kidney disease, or unspecified chronic kidney disease: Secondary | ICD-10-CM | POA: Diagnosis not present

## 2015-12-07 DIAGNOSIS — Z7901 Long term (current) use of anticoagulants: Secondary | ICD-10-CM | POA: Diagnosis not present

## 2015-12-07 DIAGNOSIS — Z96643 Presence of artificial hip joint, bilateral: Secondary | ICD-10-CM | POA: Diagnosis not present

## 2015-12-07 DIAGNOSIS — I428 Other cardiomyopathies: Secondary | ICD-10-CM | POA: Insufficient documentation

## 2015-12-07 DIAGNOSIS — I4891 Unspecified atrial fibrillation: Secondary | ICD-10-CM | POA: Diagnosis not present

## 2015-12-07 DIAGNOSIS — E7800 Pure hypercholesterolemia, unspecified: Secondary | ICD-10-CM | POA: Diagnosis not present

## 2015-12-07 DIAGNOSIS — I481 Persistent atrial fibrillation: Secondary | ICD-10-CM

## 2015-12-07 DIAGNOSIS — E039 Hypothyroidism, unspecified: Secondary | ICD-10-CM | POA: Insufficient documentation

## 2015-12-07 DIAGNOSIS — K219 Gastro-esophageal reflux disease without esophagitis: Secondary | ICD-10-CM | POA: Insufficient documentation

## 2015-12-07 DIAGNOSIS — G4730 Sleep apnea, unspecified: Secondary | ICD-10-CM | POA: Insufficient documentation

## 2015-12-07 DIAGNOSIS — I2510 Atherosclerotic heart disease of native coronary artery without angina pectoris: Secondary | ICD-10-CM | POA: Insufficient documentation

## 2015-12-07 DIAGNOSIS — I4819 Other persistent atrial fibrillation: Secondary | ICD-10-CM | POA: Insufficient documentation

## 2015-12-07 HISTORY — PX: CARDIOVERSION: SHX1299

## 2015-12-07 SURGERY — CARDIOVERSION
Anesthesia: General

## 2015-12-07 MED ORDER — AMIODARONE HCL 200 MG PO TABS: 200.0000 mg | ORAL_TABLET | Freq: Every day | ORAL | Status: DC

## 2015-12-07 MED ORDER — SODIUM CHLORIDE 0.9 % IV SOLN
INTRAVENOUS | Status: DC
  Administered 2015-12-07: 500 mL via INTRAVENOUS

## 2015-12-07 MED ORDER — PROPOFOL 10 MG/ML IV BOLUS
INTRAVENOUS | Status: DC | PRN
  Administered 2015-12-07: 50 mg via INTRAVENOUS
  Administered 2015-12-07: 20 mg via INTRAVENOUS

## 2015-12-07 NOTE — Anesthesia Postprocedure Evaluation (Signed)
Anesthesia Post Note  Patient: Charles [name]  Procedure(s) Performed: Procedure(s) (LRB): CARDIOVERSION (N/A)  Patient location during evaluation: Endoscopy Anesthesia Type: MAC Level of consciousness: awake Pain management: pain level controlled Vital Signs Assessment: post-procedure vital signs reviewed and stable Respiratory status: spontaneous breathing Cardiovascular status: stable Postop Assessment: no signs of nausea or vomiting Anesthetic complications: no    Last Vitals:  Filed Vitals:   12/07/15 1306 12/07/15 1449  BP: 108/64 109/65  Pulse: 120 62  Resp: 20 10    Last Pain: There were no vitals filed for this visit.               Gordon Vandunk

## 2015-12-07 NOTE — Anesthesia Preprocedure Evaluation (Addendum)
Anesthesia Evaluation  Patient identified by MRN, date of birth, ID band Patient awake    Reviewed: Allergy & Precautions, NPO status , Patient's Chart, lab work & pertinent test results, reviewed documented beta blocker date and time   History of Anesthesia Complications Negative for: history of anesthetic complications  Airway Mallampati: II  TM Distance: >3 FB Neck ROM: Full    Dental  (+) Teeth Intact, Missing   Pulmonary sleep apnea and Continuous Positive Airway Pressure Ventilation ,    breath sounds clear to auscultation       Cardiovascular hypertension, Pt. on medications and Pt. on home beta blockers + CAD, +CHF and + Orthopnea  + dysrhythmias Atrial Fibrillation  Rhythm:Irregular     Neuro/Psych negative neurological ROS  negative psych ROS   GI/Hepatic hiatal hernia, GERD  Medicated and Controlled,  Endo/Other  Hypothyroidism   Renal/GU Renal InsufficiencyRenal disease     Musculoskeletal  (+) Arthritis ,   Abdominal   Peds  Hematology   Anesthesia Other Findings   Reproductive/Obstetrics                            Anesthesia Physical Anesthesia Plan  ASA: III  Anesthesia Plan: MAC   Post-op Pain Management:    Induction: Intravenous  Airway Management Planned: Mask  Additional Equipment: None  Intra-op Plan:   Post-operative Plan: Extubation in OR  Informed Consent: I have reviewed the patients History and Physical, chart, labs and discussed the procedure including the risks, benefits and alternatives for the proposed anesthesia with the patient or authorized representative who has indicated his/her understanding and acceptance.   Dental advisory given  Plan Discussed with: CRNA, Anesthesiologist and Surgeon  Anesthesia Plan Comments:        Anesthesia Quick Evaluation

## 2015-12-07 NOTE — Transfer of Care (Signed)
Immediate Anesthesia Transfer of Care Note  Patient: Charles [name]  Procedure(s) Performed: Procedure(s): CARDIOVERSION (N/A)  Patient Location: Endoscopy Unit  Anesthesia Type:General  Level of Consciousness: awake, oriented and patient cooperative  Airway & Oxygen Therapy: Patient Spontanous Breathing and Patient connected to nasal cannula oxygen  Post-op Assessment: Report given to RN, Post -op Vital signs reviewed and stable and Patient moving all extremities  Post vital signs: Reviewed and stable  Last Vitals:  Filed Vitals:   12/07/15 1306  BP: 108/64  Pulse: 120  Resp: 20    Last Pain: There were no vitals filed for this visit.       Complications: No apparent anesthesia complications

## 2015-12-07 NOTE — H&P (Signed)
     INTERVAL PROCEDURE H&P  History and Physical Interval Note:  12/07/2015 1:31 PM  Charles [name] has presented today for their planned procedure. The various methods of treatment have been discussed with the patient and family. After consideration of risks, benefits and other options for treatment, the patient has consented to the procedure.  The patients' outpatient history has been reviewed, patient examined, and no change in status from most recent office note within the past 30 days. I have reviewed the patients' chart and labs and will proceed as planned. Questions were answered to the patient's satisfaction.   Chrystie Nose, MD, [facility] Attending Cardiologist [facility]  Chrystie Nose 12/07/2015, 1:31 PM

## 2015-12-07 NOTE — CV Procedure (Signed)
    CARDIOVERSION NOTE  Procedure: Electrical Cardioversion Indications:  Atrial Fibrillation  Procedure Details:  Consent: Risks of procedure as well as the alternatives and risks of each were explained to the (patient/caregiver).  Consent for procedure obtained.  Time Out: Verified patient identification, verified procedure, site/side was marked, verified correct patient position, special equipment/implants available, medications/allergies/relevent history reviewed, required imaging and test results available.  Performed  Patient placed on cardiac monitor, pulse oximetry, supplemental oxygen as necessary.  Sedation given: Propofol per anesthesia Pacer pads placed anterior and posterior chest.  Cardioverted 1 time(s).  Cardioverted at 150J biphasic.  Impression: Findings: Post procedure EKG shows: Sinus bradycardia Complications: None Patient did tolerate procedure well.  Plan: 1. Successful DCCV to NSR with a single 150J biphasic shock.  Time Spent Directly with the Patient:  30 minutes   Chrystie Nose, MD, [facility] Attending Cardiologist [facility]  Chrystie Nose 12/07/2015, 2:57 PM

## 2015-12-07 NOTE — Discharge Instructions (Signed)
Electrical Cardioversion, Care After °Refer to this sheet in the next few weeks. These instructions provide you with information on caring for yourself after your procedure. Your health care provider may also give you more specific instructions. Your treatment has been planned according to current medical practices, but problems sometimes occur. Call your health care provider if you have any problems or questions after your procedure. °WHAT TO EXPECT AFTER THE PROCEDURE °After your procedure, it is typical to have the following sensations: °· Some redness on the skin where the shocks were delivered. If this is tender, a sunburn lotion or hydrocortisone cream may help. °· Possible return of an abnormal heart rhythm within hours or days after the procedure. °HOME CARE INSTRUCTIONS °· Take medicines only as directed by your health care provider. Be sure you understand how and when to take your medicine. °· Learn how to feel your pulse and check it often. °· Limit your activity for 48 hours after the procedure or as directed by your health care provider. °· Avoid or minimize caffeine and other stimulants as directed by your health care provider. °SEEK MEDICAL CARE IF: °· You feel like your heart is beating too fast or your pulse is not regular. °· You have any questions about your medicines. °· You have bleeding that will not stop. °SEEK IMMEDIATE MEDICAL CARE IF: °· You are dizzy or feel faint. °· It is hard to breathe or you feel short of breath. °· There is a change in discomfort in your chest. °· Your speech is slurred or you have trouble moving an arm or leg on one side of your body. °· You get a serious muscle cramp that does not go away. °· Your fingers or toes turn cold or blue. °  °This information is not intended to replace advice given to you by your health care provider. Make sure you discuss any questions you have with your health care provider. °  °Document Released: 04/02/2013 Document Revised: 07/03/2014  Document Reviewed: 04/02/2013 °Elsevier Interactive Patient Education ©2016 [employer] ° °

## 2015-12-09 ENCOUNTER — Encounter (HOSPITAL_COMMUNITY): Payer: Self-pay | Admitting: Internal Medicine

## 2015-12-14 ENCOUNTER — Ambulatory Visit (HOSPITAL_COMMUNITY)
Admission: RE | Admit: 2015-12-14 | Discharge: 2015-12-14 | Disposition: A | Discharge status: Home / Self Care | Payer: Medicare Other | Source: Ambulatory Visit | Attending: Nurse Practitioner | Admitting: Nurse Practitioner

## 2015-12-14 ENCOUNTER — Other Ambulatory Visit: Payer: Self-pay

## 2015-12-14 ENCOUNTER — Encounter (HOSPITAL_COMMUNITY): Payer: Self-pay | Admitting: Nurse Practitioner

## 2015-12-14 VITALS — BP 148/78 | HR 55 | Ht 71.0 in | Wt 202.0 lb

## 2015-12-14 DIAGNOSIS — I2510 Atherosclerotic heart disease of native coronary artery without angina pectoris: Secondary | ICD-10-CM | POA: Insufficient documentation

## 2015-12-14 DIAGNOSIS — I481 Persistent atrial fibrillation: Secondary | ICD-10-CM | POA: Diagnosis not present

## 2015-12-14 DIAGNOSIS — I340 Nonrheumatic mitral (valve) insufficiency: Secondary | ICD-10-CM | POA: Diagnosis not present

## 2015-12-14 DIAGNOSIS — R739 Hyperglycemia, unspecified: Secondary | ICD-10-CM | POA: Insufficient documentation

## 2015-12-14 DIAGNOSIS — I480 Paroxysmal atrial fibrillation: Secondary | ICD-10-CM | POA: Diagnosis not present

## 2015-12-14 DIAGNOSIS — E7800 Pure hypercholesterolemia, unspecified: Secondary | ICD-10-CM | POA: Insufficient documentation

## 2015-12-14 DIAGNOSIS — N183 Chronic kidney disease, stage 3 (moderate): Secondary | ICD-10-CM | POA: Insufficient documentation

## 2015-12-14 DIAGNOSIS — R001 Bradycardia, unspecified: Secondary | ICD-10-CM | POA: Diagnosis not present

## 2015-12-14 DIAGNOSIS — G250 Essential tremor: Secondary | ICD-10-CM | POA: Diagnosis not present

## 2015-12-14 DIAGNOSIS — I130 Hypertensive heart and chronic kidney disease with heart failure and stage 1 through stage 4 chronic kidney disease, or unspecified chronic kidney disease: Secondary | ICD-10-CM | POA: Insufficient documentation

## 2015-12-14 DIAGNOSIS — Z880 Allergy status to penicillin: Secondary | ICD-10-CM | POA: Diagnosis not present

## 2015-12-14 DIAGNOSIS — I509 Heart failure, unspecified: Secondary | ICD-10-CM | POA: Insufficient documentation

## 2015-12-14 DIAGNOSIS — I4819 Other persistent atrial fibrillation: Secondary | ICD-10-CM

## 2015-12-14 LAB — BASIC METABOLIC PANEL
ANION GAP: 8 (ref 5–15)
BUN: 28 mg/dL — ABNORMAL HIGH (ref 6–20)
CALCIUM: 10.2 mg/dL (ref 8.9–10.3)
CHLORIDE: 102 mmol/L (ref 101–111)
CO2: 26 mmol/L (ref 22–32)
Creatinine, Ser: 1.64 mg/dL — ABNORMAL HIGH (ref 0.61–1.24)
GFR calc non Af Amer: 39 mL/min — ABNORMAL LOW (ref >60)
GFR, EST AFRICAN AMERICAN: 45 mL/min — AB (ref >60)
Glucose, Bld: 83 mg/dL (ref 65–99)
POTASSIUM: 3.8 mmol/L (ref 3.5–5.1)
Sodium: 136 mmol/L (ref 135–145)

## 2015-12-14 NOTE — Progress Notes (Signed)
Patient ID: Charles [name], male   DOB: [birth year], 75 y.o.   MRN: [id number]       Primary Care Physician: [name], MD Referring Physician: Mylinda Latina Triage Cardiologist: Dr. Lenard Simmer is a 75 y.o. male with a h/o PAF on amiodarone at 100 mg a day. He has PAF Wednesday thru Friday, but had had persistent afib since Friday pm. In the past amiodarone was increased for a few days and he chemically converted. He has had prior cardioversion's x 4, in 2012, 2014, and 2 in 2014 around the time of an Afib ablation. He does have benign essential tremors which the amiodarone exaggerates, so when in SR the dose is kept low at 100 mg a day. The pt does not feel the afib, knows only by the elevated heart rate on the monitor when he checks his BP. He has not missed any doses of DOAC. Chadsvasc score of at least 4. No known triggers.  He returns 6/12, he continues in persistent afib despite increase of amiodarone 200 mg bid. He tolerates well, but is feeling more fatigue.He has not missed any doses of edoxaban. He wishes to pursue DCCV.  Successful cardioversion 6/13. Ekg today shows SB at 55 bpm, will reduce amio today back to 100 mg qd, prior dose to minimize tremors. Bmet to be rechecked today due to increase of creatinine to 1.91 prior to cardioversion form 1.55, h/ CKD.  Today, he denies symptoms of palpitations, chest pain, shortness of breath, orthopnea, PND, lower extremity edema, dizziness, presyncope, syncope, or neurologic sequela. The patient is tolerating medications without difficulties and is otherwise without complaint today.   Past Medical History  Diagnosis Date  . Paroxysmal atrial fibrillation (HCC)     a. H/o difficult to control. b. s/p PVI ablation 12-03-2012 by [name] with recurrence afterwards, requiring period of amiodarone.  . Peripheral edema   . Mitral regurgitation     a. Mild by echo in 01/2014.  Marland Kitchen Atrial dilatation     mild biatrial dilitation   . Nonischemic cardiomyopathy (HCC)     a. presumed to be tachycardia mediated. Varying EFs over the years from 35-40% to normal and back.  . Hypertension   . Hypercholesterolemia   . CKD (chronic kidney disease), stage III   . GERD (gastroesophageal reflux disease)   . H/O hiatal hernia   . Arthritis   . Tear of medial meniscus of left knee   . Bruises easily   . Hypothyroidism   . Sleep apnea     USES C PAP  . Hyperglycemia   . Tremor   . CHF (congestive heart failure) (HCC)   . Dysrhythmia     atrial fibrillation  . Coronary artery disease    Past Surgical History  Procedure Laterality Date  . Lithotripsy      x 2  . Joint replacement  2005    total right hip replacement  . Hernia repair  03/2005  . Kidney stent  1987, 02/2005  . Quadrec  1999    LFT leg quad sx  . Cardioversion N/A 09/04/2012    Procedure: CARDIOVERSION;  Surgeon: [name], MD;  Location[facility] ENDOSCOPY;  Service: Cardiovascular;  Laterality: N/A;  . Tee without cardioversion N/A 12/02/2012    Procedure: TRANSESOPHAGEAL ECHOCARDIOGRAM (TEE);  Surgeon: [name], MD;  Location: [facility] ENDOSCOPY;  Service: Cardiovascular;  Laterality: N/A;  . Cardioversion N/A 02/11/2013    Procedure: CARDIOVERSION;  Surgeon: [name], MD;  Location: [facility] ENDOSCOPY;  Service: Cardiovascular;  Laterality: N/A;  . Knee arthroscopy with medial menisectomy Left 11/20/2013    Procedure: LEFT KNEE ARTHROSCOPY WITH MEDIAL MENISECTOMY;  Surgeon: Jacki Cones, MD;  Location: [facility] ORS;  Service: Orthopedics;  Laterality: Left;  Microfracture of medial femoral chondyle and abrasion chondroplasty of the medial femoral chondyle  . Knee arthroscopy with drilling/microfracture Left 11/20/2013    Procedure: KNEE ARTHROSCOPY WITH DRILLING/MICROFRACTURE;  Surgeon: Jacki Cones, MD;  Location: [facility] ORS;  Service: Orthopedics;  Laterality: Left;  . Atrial fibrillation ablation N/A 12/03/2012    Procedure: ATRIAL FIBRILLATION ABLATION;   Surgeon: Hillis Range, MD;  Location: [facility] CATH LAB;  Service: Cardiovascular;  Laterality: N/A;  . Right hip surgery  2007    irrigation and debridement from bacteria  . Cardiac catheterization    . Atrial fibrillation ablation      PVI by [name]  . Eye surgery  [phone]    bilateral cataract surgery with lens implants  . Picc line placement  2007  . Kidney stone surgery      2 stents placed  . Total hip arthroplasty Left 08/12/2015    Procedure: LEFT TOTAL HIP ARTHROPLASTY;  Surgeon: Ranee Gosselin, MD;  Location: [facility] ORS;  Service: Orthopedics;  Laterality: Left;  . Cardioversion N/A 12/07/2015    Procedure: CARDIOVERSION;  Surgeon: Chrystie Nose, MD;  Location: [facility] ENDOSCOPY;  Service: Cardiovascular;  Laterality: N/A;    Current Outpatient Prescriptions  Medication Sig Dispense Refill  . acetaminophen (TYLENOL ARTHRITIS PAIN) 650 MG CR tablet Take 650 mg by mouth every 8 (eight) hours as needed for pain (taking 1 each morning).    Marland Kitchen amiodarone (PACERONE) 200 MG tablet Take 1 tablet (200 mg total) by mouth daily.    . Ascorbic Acid (VITAMIN C) 500 MG tablet Take 500-1,000 mg by mouth 2 (two) times daily. Pt takes two tablets in the morning and one tablet in the evening.    . Cholecalciferol (VITAMIN D) 2000 UNITS CAPS Take 4,000 Units by mouth every morning.     . Coenzyme Q10 (COQ10 PO) Take 100 mg by mouth every morning.     . edoxaban ([name]) 60 MG TABS tablet Take 60 mg by mouth daily. 30 tablet 6  . enalapril (VASOTEC) 20 MG tablet TAKE ONE-HALF TABLET BY MOUTH ONCE DAILY IN THE EVENING 45 tablet 3  . ferrous sulfate 325 (65 FE) MG tablet Take 1 tablet (325 mg total) by mouth daily with breakfast. 30 tablet 3  . furosemide (LASIX) 40 MG tablet Take 60 mg by mouth every morning.    Marland Kitchen levothyroxine (SYNTHROID, LEVOTHROID) 50 MCG tablet Take 1 tablet (50 mcg total) by mouth daily before breakfast. 90 tablet 3  . magnesium oxide (MAG-OX) 400 MG tablet Take 400 mg by mouth daily at  12 noon.    . metolazone (ZAROXOLYN) 5 MG tablet Take 1 tablet (5 mg total) by mouth daily as needed (fluid retention). 15 tablet 1  . metoprolol succinate (TOPROL-XL) 50 MG 24 hr tablet Take 1 tablet in the morning and 1/2 tablet in the evening. 135 tablet 3  . Misc Natural Products (OSTEO BI-FLEX JOINT SHIELD PO) Take 2 tablets by mouth every morning.     . Multiple Vitamin (MULTIVITAMIN) tablet Take 1 tablet by mouth every morning.     . Omega-3 Fatty Acids (FISH OIL) 1000 MG CAPS Take 1 capsule by mouth 2 (two) times daily.     Marland Kitchen omeprazole (PRILOSEC)  20 MG capsule Take 20 mg by mouth every other day.     . polyvinyl alcohol (LIQUIFILM TEARS) 1.4 % ophthalmic solution Place 1 drop into both eyes daily as needed for dry eyes.    Marland Kitchen zinc gluconate 50 MG tablet Take 50 mg by mouth every morning.     Marland Kitchen guaiFENesin (MUCINEX) 600 MG 12 hr tablet Take 600 mg by mouth 2 (two) times daily as needed for congestion. Reported on 12/14/2015     No current facility-administered medications for this encounter.    Allergies  Allergen Reactions  . Ceftriaxone Sodium Rash    Rocephin  . Diltiazem Hcl Rash    Cardizem  . Penicillins Rash    Social History   Social History  . Marital Status: Married    Spouse Name: N/A  . Number of Children: N/A  . Years of Education: N/A   Occupational History  . Systems developer    Social History Main Topics  . Smoking status: Never Smoker   . Smokeless tobacco: Never Used  . Alcohol Use: No  . Drug Use: No  . Sexual Activity: Not on file   Other Topics Concern  . Not on file   Social History Narrative   Lives in [place] with spouse.  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   [employer]    Family History  Problem Relation Age of Onset  . Cancer Mother 2    CANCER  . Heart disease Father 8    HEART PROBLEMS  . Lupus Brother     "of the skin"    ROS- All systems are reviewed and negative except as per  the HPI above  Physical Exam: Filed Vitals:   12/14/15 0943  BP: 148/78  Pulse: 55  Height:  (1.803 m)  Weight: 202 lb (91.627 kg)    GEN- The patient is well appearing, alert and oriented x 3 today.   Head- normocephalic, atraumatic Eyes-  Sclera clear, conjunctiva pink Ears- hearing intact Oropharynx- clear Neck- supple, no JVP Lymph- no cervical lymphadenopathy Lungs- Clear to ausculation bilaterally, normal work of breathing Heart- regular rate and rhythm, no murmurs, rubs or gallops, PMI not laterally displaced GI- soft, NT, ND, + BS Extremities- no clubbing, cyanosis, or edema MS- no significant deformity or atrophy Skin- no rash or lesion Psych- euthymic mood, full affect Neuro- strength and sensation are intact  EKG-SR at 55 bpm, pr int 172 ms, qrs int 94 ms, qtc 434 ms Epic records reviewed  Assessment and Plan: 1. Persistent afib Successful DCCV No missed doses of DOAC Return to one half tablet of amiodarone 200 mg a day  to minimize tremors Bmet today  F/u per scheduled with Dr. Epifania Gore [name] [address] [place], [place] [address] [phone]

## 2015-12-31 ENCOUNTER — Other Ambulatory Visit: Payer: Self-pay | Admitting: *Deleted

## 2015-12-31 MED ORDER — FUROSEMIDE 40 MG PO TABS: 60.0000 mg | ORAL_TABLET | Freq: Every morning | ORAL | Status: DC

## 2015-12-31 NOTE — Telephone Encounter (Signed)
Rx(s) sent to pharmacy electronically.  

## 2016-01-11 ENCOUNTER — Other Ambulatory Visit: Payer: Self-pay | Admitting: *Deleted

## 2016-01-11 MED ORDER — FUROSEMIDE 40 MG PO TABS: 60.0000 mg | ORAL_TABLET | Freq: Every morning | ORAL | Status: DC

## 2016-01-11 NOTE — Telephone Encounter (Signed)
Patients wife called and stated that the pharmacy is only dispensing enough for twenty days with each refill. She requested that it be changed to a ninety day supply.

## 2016-02-04 NOTE — Addendum Note (Signed)
Addendum  created 02/04/16 1200 by Val Eagle, MD   Anesthesia Staff edited

## 2016-02-17 ENCOUNTER — Other Ambulatory Visit (HOSPITAL_COMMUNITY): Payer: Self-pay | Admitting: Internal Medicine

## 2016-02-17 MED ORDER — AMIODARONE HCL 200 MG PO TABS: 200.0000 mg | ORAL_TABLET | Freq: Every day | ORAL | 3 refills | Status: DC

## 2016-02-17 NOTE — Telephone Encounter (Signed)
Rx sent to pharmacy.  [name] savings card placed at front desk for pick up. Pt aware.

## 2016-02-29 DIAGNOSIS — H26491 Other secondary cataract, right eye: Secondary | ICD-10-CM | POA: Diagnosis not present

## 2016-02-29 DIAGNOSIS — H26492 Other secondary cataract, left eye: Secondary | ICD-10-CM | POA: Diagnosis not present

## 2016-02-29 DIAGNOSIS — H04123 Dry eye syndrome of bilateral lacrimal glands: Secondary | ICD-10-CM | POA: Diagnosis not present

## 2016-02-29 DIAGNOSIS — H40013 Open angle with borderline findings, low risk, bilateral: Secondary | ICD-10-CM | POA: Diagnosis not present

## 2016-03-08 ENCOUNTER — Other Ambulatory Visit: Payer: Self-pay | Admitting: *Deleted

## 2016-03-08 ENCOUNTER — Encounter: Payer: Self-pay | Admitting: Cardiology

## 2016-03-08 DIAGNOSIS — I4891 Unspecified atrial fibrillation: Secondary | ICD-10-CM

## 2016-03-08 MED ORDER — EDOXABAN TOSYLATE 60 MG PO TABS: 60.0000 mg | ORAL_TABLET | Freq: Every day | ORAL | 6 refills | Status: DC

## 2016-04-20 NOTE — Progress Notes (Signed)
HPI: FU atrial fibrillation.  He has a past history of paroxysmal atrial fibrillation and tachycardia mediated cardiomyopathy.He underwent mapping and ablation of atrial fib in 11/2012 by Dr. Johney FrameAllred. He had early recurrence of atrial fib in 12/2012 requiring repeat DCCV. Dr. Johney FrameAllred restarted amiodarone 02/2013 to try and achieve and maintain NSR to allow the heart to remodel. In 09/2013 amiodarone was decreased and he was changed to [name] (edoxaban). Amiodarone was stopped in 01/2014 due to maintenance of NSR.   However, he was seen on 12/22/14 for recurrent atrial fibrillation. He was restarted on amiodarone. Subsequently converted back to normal sinus rhythm on amiodarone. The patient has developed some increased tremors of his hands from the amiodarone.Tremor has improved since we cut back on his amiodarone dose to just 100 mg daily. Last echocardiogram August 2015 showed normal LV systolic function, grade 2 diastolic dysfunction, mild mitral regurgitation and moderate left atrial enlargement. Carotid dopplers April 2017 normal. Had repeat cardioversion June 2017.  Since last seen, the patient has dyspnea with more extreme activities but not with routine activities. It is relieved with rest. It is not associated with chest pain. There is no orthopnea, PND or pedal edema. There is no syncope or palpitations. There is no exertional chest pain. Still with occasional tremors.   Current Outpatient Prescriptions  Medication Sig Dispense Refill  . acetaminophen (TYLENOL ARTHRITIS PAIN) 650 MG CR tablet Take 650 mg by mouth every 8 (eight) hours as needed for pain (taking 1 each morning).    Marland Kitchen. amiodarone (PACERONE) 200 MG tablet Take 1 tablet (200 mg total) by mouth daily. (Patient taking differently: Take 100 mg by mouth daily. ) 30 tablet 3  . Ascorbic Acid (VITAMIN C) 500 MG tablet Take 500-1,000 mg by mouth 2 (two) times daily. Pt takes two tablets in the morning and one tablet in the evening.     . Cholecalciferol (VITAMIN D) 2000 UNITS CAPS Take 4,000 Units by mouth every morning.     . Coenzyme Q10 (COQ10 PO) Take 100 mg by mouth every morning.     . edoxaban ([name]) 60 MG TABS tablet Take 60 mg by mouth daily. 30 tablet 6  . enalapril (VASOTEC) 20 MG tablet TAKE ONE-HALF TABLET BY MOUTH ONCE DAILY IN THE EVENING 45 tablet 3  . furosemide (LASIX) 40 MG tablet Take 1.5 tablets (60 mg total) by mouth every morning. 30 tablet 4  . guaiFENesin (MUCINEX) 600 MG 12 hr tablet Take 600 mg by mouth 2 (two) times daily as needed for congestion. Reported on 12/14/2015    . levothyroxine (SYNTHROID, LEVOTHROID) 50 MCG tablet Take 1 tablet (50 mcg total) by mouth daily before breakfast. 90 tablet 3  . magnesium oxide (MAG-OX) 400 MG tablet Take 400 mg by mouth daily at 12 noon.    . metolazone (ZAROXOLYN) 5 MG tablet Take 1 tablet (5 mg total) by mouth daily as needed (fluid retention). 15 tablet 1  . metoprolol succinate (TOPROL-XL) 50 MG 24 hr tablet Take 1 tablet in the morning and 1/2 tablet in the evening. 135 tablet 3  . Misc Natural Products (OSTEO BI-FLEX JOINT SHIELD PO) Take 2 tablets by mouth every morning.     . Multiple Vitamin (MULTIVITAMIN) tablet Take 1 tablet by mouth every morning.     . Omega-3 Fatty Acids (FISH OIL) 1000 MG CAPS Take 1 capsule by mouth 2 (two) times daily.     Marland Kitchen. omeprazole (PRILOSEC) 20 MG capsule Take 20  mg by mouth every other day.     . polyvinyl alcohol (LIQUIFILM TEARS) 1.4 % ophthalmic solution Place 1 drop into both eyes daily as needed for dry eyes.    Marland Kitchen zinc gluconate 50 MG tablet Take 50 mg by mouth every morning.      No current facility-administered medications for this visit.      Past Medical History:  Diagnosis Date  . Arthritis   . Atrial dilatation    mild biatrial dilitation  . Bruises easily   . CHF (congestive heart failure) (HCC)   . CKD (chronic kidney disease), stage III   . Coronary artery disease   . Dysrhythmia    atrial  fibrillation  . GERD (gastroesophageal reflux disease)   . H/O hiatal hernia   . Hypercholesterolemia   . Hyperglycemia   . Hypertension   . Hypothyroidism   . Mitral regurgitation    a. Mild by echo in 01/2014.  Marland Kitchen Nonischemic cardiomyopathy (HCC)    a. presumed to be tachycardia mediated. Varying EFs over the years from 35-40% to normal and back.  . Paroxysmal atrial fibrillation (HCC)    a. H/o difficult to control. b. s/p PVI ablation 12-03-2012 by [name] with recurrence afterwards, requiring period of amiodarone.  . Peripheral edema   . Sleep apnea    USES C PAP  . Tear of medial meniscus of left knee   . Tremor     Past Surgical History:  Procedure Laterality Date  . ATRIAL FIBRILLATION ABLATION N/A 12/03/2012   Procedure: ATRIAL FIBRILLATION ABLATION;  Surgeon: Hillis Range, MD;  Location: [facility] CATH LAB;  Service: Cardiovascular;  Laterality: N/A;  . ATRIAL FIBRILLATION ABLATION     PVI by [name]  . CARDIAC CATHETERIZATION    . CARDIOVERSION N/A 09/04/2012   Procedure: CARDIOVERSION;  Surgeon: [name], MD;  Location: [facility] ENDOSCOPY;  Service: Cardiovascular;  Laterality: N/A;  . CARDIOVERSION N/A 02/11/2013   Procedure: CARDIOVERSION;  Surgeon: [name], MD;  Location: [facility] ENDOSCOPY;  Service: Cardiovascular;  Laterality: N/A;  . CARDIOVERSION N/A 12/07/2015   Procedure: CARDIOVERSION;  Surgeon: Chrystie Nose, MD;  Location: [facility] ENDOSCOPY;  Service: Cardiovascular;  Laterality: N/A;  . EYE SURGERY  2015,2016   bilateral cataract surgery with lens implants  . HERNIA REPAIR  03/2005  . JOINT REPLACEMENT  2005   total right hip replacement  . kidney stent  1987, 02/2005  . KIDNEY STONE SURGERY     2 stents placed  . KNEE ARTHROSCOPY WITH DRILLING/MICROFRACTURE Left 11/20/2013   Procedure: KNEE ARTHROSCOPY WITH DRILLING/MICROFRACTURE;  Surgeon: Jacki Cones, MD;  Location: [facility] ORS;  Service: Orthopedics;  Laterality: Left;  . KNEE ARTHROSCOPY WITH MEDIAL  MENISECTOMY Left 11/20/2013   Procedure: LEFT KNEE ARTHROSCOPY WITH MEDIAL MENISECTOMY;  Surgeon: Jacki Cones, MD;  Location: [facility] ORS;  Service: Orthopedics;  Laterality: Left;  Microfracture of medial femoral chondyle and abrasion chondroplasty of the medial femoral chondyle  . LITHOTRIPSY     x 2  . PICC line placement  2007  . quadrec  1999   LFT leg quad sx  . right hip surgery  2007   irrigation and debridement from bacteria  . TEE WITHOUT CARDIOVERSION N/A 12/02/2012   Procedure: TRANSESOPHAGEAL ECHOCARDIOGRAM (TEE);  Surgeon: [name], MD;  Location: [facility] ENDOSCOPY;  Service: Cardiovascular;  Laterality: N/A;  . TOTAL HIP ARTHROPLASTY Left 08/12/2015   Procedure: LEFT TOTAL HIP ARTHROPLASTY;  Surgeon: Ranee Gosselin, MD;  Location: [facility]  ORS;  Service: Orthopedics;  Laterality: Left;    Social History   Social History  . Marital status: Married    Spouse name: N/A  . Number of children: N/A  . Years of education: N/A   Occupational History  . Systems developer Retired   Social History Main Topics  . Smoking status: Never Smoker  . Smokeless tobacco: Never Used  . Alcohol use No  . Drug use: No  . Sexual activity: Not on file   Other Topics Concern  . Not on file   Social History Narrative   Lives in [place] with spouse.  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   [employer]    Family History  Problem Relation Age of Onset  . Cancer Mother 71    CANCER  . Heart disease Father 34    HEART PROBLEMS  . Lupus Brother     "of the skin"    ROS: no fevers or chills, productive cough, hemoptysis, dysphasia, odynophagia, melena, hematochezia, dysuria, hematuria, rash, seizure activity, orthopnea, PND, pedal edema, claudication. Remaining systems are negative.  Physical Exam: Well-developed well-nourished in no acute distress.  Skin is warm and dry.  HEENT is normal.  Neck is supple.  Chest is clear to auscultation with normal  expansion.  Cardiovascular exam is regular rate and rhythm.  Abdominal exam nontender or distended. No masses palpated. Extremities show no edema. neuro grossly intact  ECG-Sinus rhythm at a rate of 64. No significant ST changes.  A/P  1 Hypertension-blood pressure controlled. Continue present medications.  2 paroxysmal atrial fibrillation-patient remains in sinus rhythm today. Continue low-dose amiodarone. Check TSH, liver functions and chest x-ray. Continue anticoagulation. Check hemoglobin and renal function.  3 stage III chronic kidney disease-management per primary care.  Olga Millers, MD

## 2016-05-01 ENCOUNTER — Ambulatory Visit (INDEPENDENT_AMBULATORY_CARE_PROVIDER_SITE_OTHER): Payer: Medicare Other | Admitting: Cardiology

## 2016-05-01 ENCOUNTER — Encounter: Payer: Self-pay | Admitting: Cardiology

## 2016-05-01 VITALS — BP 136/60 | HR 64 | Ht 70.0 in | Wt 198.0 lb

## 2016-05-01 DIAGNOSIS — I10 Essential (primary) hypertension: Secondary | ICD-10-CM | POA: Diagnosis not present

## 2016-05-01 DIAGNOSIS — I4891 Unspecified atrial fibrillation: Secondary | ICD-10-CM

## 2016-05-01 NOTE — Patient Instructions (Addendum)
Medication Instructions:   NO CHANGE  LAB WORK:  Your physician recommends that you HAVE LAB WORK AT THE [address] [facility]  Testing/Procedures:  A chest x-ray takes a picture of the organs and structures inside the chest, including the heart, lungs, and blood vessels. This test can show several things, including, whether the heart is enlarges; whether fluid is building up in the lungs; and whether pacemaker / defibrillator leads are still in place. AT [facility]  Follow-Up:  Your physician wants you to follow-up in: 6 MONTHS WITH DR Shelda Pal will receive a reminder letter in the mail two months in advance. If you don't receive a letter, please call our office to schedule the follow-up appointment.   If you need a refill on your cardiac medications before your next appointment, please call your pharmacy.

## 2016-05-03 ENCOUNTER — Ambulatory Visit (HOSPITAL_COMMUNITY)
Admission: RE | Admit: 2016-05-03 | Discharge: 2016-05-03 | Disposition: A | Discharge status: Home / Self Care | Payer: Medicare Other | Source: Ambulatory Visit | Attending: Cardiology | Admitting: Cardiology

## 2016-05-03 ENCOUNTER — Other Ambulatory Visit: Payer: Medicare Other | Admitting: *Deleted

## 2016-05-03 DIAGNOSIS — I4891 Unspecified atrial fibrillation: Secondary | ICD-10-CM

## 2016-05-03 DIAGNOSIS — N200 Calculus of kidney: Secondary | ICD-10-CM | POA: Diagnosis not present

## 2016-05-03 LAB — BASIC METABOLIC PANEL
BUN: 28 mg/dL — AB (ref 7–25)
CHLORIDE: 104 mmol/L (ref 98–110)
CO2: 21 mmol/L (ref 20–31)
CREATININE: 1.54 mg/dL — AB (ref 0.70–1.18)
Calcium: 9.2 mg/dL (ref 8.6–10.3)
Glucose, Bld: 95 mg/dL (ref 65–99)
POTASSIUM: 4 mmol/L (ref 3.5–5.3)
Sodium: 138 mmol/L (ref 135–146)

## 2016-05-03 LAB — CBC
HEMATOCRIT: 35.2 % — AB (ref 38.5–50.0)
Hemoglobin: 11.8 g/dL — ABNORMAL LOW (ref 13.2–17.1)
MCH: 30.9 pg (ref 27.0–33.0)
MCHC: 33.5 g/dL (ref 32.0–36.0)
MCV: 92.1 fL (ref 80.0–100.0)
MPV: 9.8 fL (ref 7.5–12.5)
PLATELETS: 199 10*3/uL (ref 140–400)
RBC: 3.82 MIL/uL — ABNORMAL LOW (ref 4.20–5.80)
RDW: 13 % (ref 11.0–15.0)
WBC: 8.1 10*3/uL (ref 3.8–10.8)

## 2016-05-03 LAB — TSH: TSH: 6.11 mIU/L — ABNORMAL HIGH (ref 0.40–4.50)

## 2016-05-03 LAB — HEPATIC FUNCTION PANEL
ALT: 15 U/L (ref 9–46)
AST: 21 U/L (ref 10–35)
Albumin: 4.2 g/dL (ref 3.6–5.1)
Alkaline Phosphatase: 45 U/L (ref 40–115)
Bilirubin, Direct: 0.1 mg/dL (ref <=0.2)
Indirect Bilirubin: 0.4 mg/dL (ref 0.2–1.2)
TOTAL PROTEIN: 6.4 g/dL (ref 6.1–8.1)
Total Bilirubin: 0.5 mg/dL (ref 0.2–1.2)

## 2016-05-05 ENCOUNTER — Telehealth: Payer: Self-pay | Admitting: *Deleted

## 2016-05-05 ENCOUNTER — Other Ambulatory Visit: Payer: Self-pay | Admitting: Cardiology

## 2016-05-05 DIAGNOSIS — E039 Hypothyroidism, unspecified: Secondary | ICD-10-CM

## 2016-05-05 MED ORDER — LEVOTHYROXINE SODIUM 75 MCG PO TABS: 75.0000 ug | ORAL_TABLET | Freq: Every day | ORAL | Status: DC

## 2016-05-05 NOTE — Telephone Encounter (Signed)
-----   Message from [name], MD sent at 05/04/2016  5:35 AM EST ----- Change synthroid to 75 micrograms daily, tsh 12 weeks Olga Millers

## 2016-05-05 NOTE — Telephone Encounter (Signed)
Spoke with pt, he is going out of town and will take 1 and 1/2 of the 50 mcg tablets he has until his return. He will let us know when he needs the new script sent to the pharmacy. Lab orders mailed to the pt

## 2016-05-17 MED ORDER — MIDAZOLAM HCL 2 MG/2ML IJ SOLN
INTRAMUSCULAR | Status: AC
  Filled 2016-05-17: qty 2

## 2016-05-17 MED ORDER — FENTANYL CITRATE (PF) 100 MCG/2ML IJ SOLN
INTRAMUSCULAR | Status: AC
  Filled 2016-05-17: qty 2

## 2016-05-17 MED ORDER — VERAPAMIL HCL 2.5 MG/ML IV SOLN
INTRAVENOUS | Status: AC
  Filled 2016-05-17: qty 2

## 2016-05-17 MED ORDER — HEPARIN SODIUM (PORCINE) 1000 UNIT/ML IJ SOLN
INTRAMUSCULAR | Status: AC
Start: 2016-05-17 — End: 2016-05-17
  Filled 2016-05-17: qty 1

## 2016-05-17 MED ORDER — IOPAMIDOL (ISOVUE-370) INJECTION 76%
INTRAVENOUS | Status: AC
  Filled 2016-05-17: qty 50

## 2016-05-17 MED ORDER — HEPARIN (PORCINE) IN NACL 2-0.9 UNIT/ML-% IJ SOLN
INTRAMUSCULAR | Status: AC
  Filled 2016-05-17: qty 1000

## 2016-05-17 MED ORDER — LIDOCAINE HCL (PF) 1 % IJ SOLN
INTRAMUSCULAR | Status: AC
  Filled 2016-05-17: qty 30

## 2016-05-17 MED ORDER — IOPAMIDOL (ISOVUE-370) INJECTION 76%
INTRAVENOUS | Status: AC
  Filled 2016-05-17: qty 100

## 2016-05-17 MED ORDER — NITROGLYCERIN 1 MG/10 ML FOR IR/CATH LAB
INTRA_ARTERIAL | Status: AC
  Filled 2016-05-17: qty 10

## 2016-05-17 MED ORDER — TICAGRELOR 90 MG PO TABS
ORAL_TABLET | ORAL | Status: AC
  Filled 2016-05-17: qty 2

## 2016-06-08 ENCOUNTER — Encounter: Payer: Self-pay | Admitting: Pulmonary Disease

## 2016-06-08 ENCOUNTER — Ambulatory Visit (INDEPENDENT_AMBULATORY_CARE_PROVIDER_SITE_OTHER): Payer: Medicare Other | Admitting: Pulmonary Disease

## 2016-06-08 VITALS — BP 136/74 | HR 63 | Ht 70.0 in | Wt 203.0 lb

## 2016-06-08 DIAGNOSIS — Z9989 Dependence on other enabling machines and devices: Secondary | ICD-10-CM

## 2016-06-08 DIAGNOSIS — G4733 Obstructive sleep apnea (adult) (pediatric): Secondary | ICD-10-CM | POA: Diagnosis not present

## 2016-06-08 DIAGNOSIS — Z77090 Contact with and (suspected) exposure to asbestos: Secondary | ICD-10-CM | POA: Diagnosis not present

## 2016-06-08 NOTE — Patient Instructions (Signed)
   Call me if you have any new breathing problems before your next appointment.  We are going to order you a new Auto-CPAP with your same face mask.   We are also doing breathing tests and a CT scan to look at your lungs for possible asbestosis.  TESTS ORDERED: 1. HRCT Chest w/o 2. Full PFTs before next appointment

## 2016-06-08 NOTE — Progress Notes (Signed)
Subjective:    Patient ID: Charles [name], male    DOB: Jan 03, 1941, 75 y.o.   MRN: [id number]  C.C.:  Follow-up for OSA & H/O Asbestos Exposure.  HPI OSA: Currently on CPAP therapy with previous split-night study showing 11 cm H2O pressure requirement. Study performed on ResMed AirFit F10 RFM medium-size. He reports he is still using his same CPAP. He reports he has noticed noises coming form his machine. He is using distilled water for humidification. He feels his "pressure is way too strong". He has had to tighten his mask significantly. He has noticed mask leaks that wake him up. He reports he is tossing and turning for comfort with his prior hip repairs at night. No morning headaches. He does nap without his CPAP during the day. He reports he doesn't feel as rested in the morning. He gets his supplies through [facility]. His machine was sold to him from a different DME company.   H/O Asbestos Exposure:  Significant prior history. Calcified pleural plaques on CXR.   Review of Systems Denies chest pain or pressure. No fever, chills, or sweats. No abdominal pain nausea or emesis.   Allergies  Allergen Reactions  . Ceftriaxone Sodium Rash    Rocephin  . Diltiazem Hcl Rash    Cardizem  . Penicillins Rash         Current Outpatient Prescriptions on File Prior to Visit  Medication Sig Dispense Refill  . acetaminophen (TYLENOL ARTHRITIS PAIN) 650 MG CR tablet Take 650 mg by mouth every 8 (eight) hours as needed for pain (taking 1 each morning).    Marland Kitchen amiodarone (PACERONE) 200 MG tablet Take 1 tablet (200 mg total) by mouth daily. (Patient taking differently: Take 100 mg by mouth daily. ) 30 tablet 3  . Ascorbic Acid (VITAMIN C) 500 MG tablet Take 500-1,000 mg by mouth 2 (two) times daily. Pt takes two tablets in the morning and one tablet in the evening.    . Cholecalciferol (VITAMIN D) 2000 UNITS CAPS Take 4,000 Units by mouth every morning.     . Coenzyme Q10 (COQ10 PO) Take  100 mg by mouth every morning.     . edoxaban ([name]) 60 MG TABS tablet Take 60 mg by mouth daily. 30 tablet 6  . enalapril (VASOTEC) 20 MG tablet TAKE ONE-HALF TABLET BY MOUTH ONCE DAILY IN THE EVENING 45 tablet 3  . furosemide (LASIX) 40 MG tablet TAKE ONE & ONE-HALF TABLETS BY MOUTH ONCE DAILY IN THE MORNING 90 tablet 1  . guaiFENesin (MUCINEX) 600 MG 12 hr tablet Take 600 mg by mouth 2 (two) times daily as needed for congestion. Reported on 12/14/2015    . levothyroxine (SYNTHROID, LEVOTHROID) 75 MCG tablet Take 1 tablet (75 mcg total) by mouth daily before breakfast.    . magnesium oxide (MAG-OX) 400 MG tablet Take 400 mg by mouth daily at 12 noon.    . metolazone (ZAROXOLYN) 5 MG tablet Take 1 tablet (5 mg total) by mouth daily as needed (fluid retention). 15 tablet 1  . metoprolol succinate (TOPROL-XL) 50 MG 24 hr tablet Take 1 tablet in the morning and 1/2 tablet in the evening. 135 tablet 3  . Misc Natural Products (OSTEO BI-FLEX JOINT SHIELD PO) Take 2 tablets by mouth every morning.     . Multiple Vitamin (MULTIVITAMIN) tablet Take 1 tablet by mouth every morning.     . Omega-3 Fatty Acids (FISH OIL) 1000 MG CAPS Take 1 capsule by mouth  2 (two) times daily.     Marland Kitchen omeprazole (PRILOSEC) 20 MG capsule Take 20 mg by mouth every other day.     . polyvinyl alcohol (LIQUIFILM TEARS) 1.4 % ophthalmic solution Place 1 drop into both eyes daily as needed for dry eyes.    Marland Kitchen zinc gluconate 50 MG tablet Take 50 mg by mouth every morning.      No current facility-administered medications on file prior to visit.     Past Medical History:  Diagnosis Date  . Arthritis   . Atrial dilatation    mild biatrial dilitation  . Bruises easily   . CHF (congestive heart failure) (HCC)   . CKD (chronic kidney disease), stage III   . Coronary artery disease   . Dysrhythmia    atrial fibrillation  . GERD (gastroesophageal reflux disease)   . H/O hiatal hernia   . Hypercholesterolemia   . Hyperglycemia    . Hypertension   . Hypothyroidism   . Mitral regurgitation    a. Mild by echo in 01/2014.  Marland Kitchen Nonischemic cardiomyopathy (HCC)    a. presumed to be tachycardia mediated. Varying EFs over the years from 35-40% to normal and back.  . Paroxysmal atrial fibrillation (HCC)    a. H/o difficult to control. b. s/p PVI ablation 12-03-2012 by [name] with recurrence afterwards, requiring period of amiodarone.  . Peripheral edema   . Sleep apnea    USES C PAP  . Tear of medial meniscus of left knee   . Tremor     Past Surgical History:  Procedure Laterality Date  . ATRIAL FIBRILLATION ABLATION N/A 12/03/2012   Procedure: ATRIAL FIBRILLATION ABLATION;  Surgeon: Hillis Range, MD;  [facility] CATH LAB;  Service: Cardiovascular;  Laterality: N/A;  . ATRIAL FIBRILLATION ABLATION     PVI by [name]  . CARDIAC CATHETERIZATION    . CARDIOVERSION N/A 09/04/2012   Procedure: CARDIOVERSION;  Surgeon: [name], MD;  Location: [facility] ENDOSCOPY;  Service: Cardiovascular;  Laterality: N/A;  . CARDIOVERSION N/A 02/11/2013   Procedure: CARDIOVERSION;  Surgeon: [name], MD;  Location: [facility] ENDOSCOPY;  Service: Cardiovascular;  Laterality: N/A;  . CARDIOVERSION N/A 12/07/2015   Procedure: CARDIOVERSION;  Surgeon: Chrystie Nose, MD;  Location: [facility] ENDOSCOPY;  Service: Cardiovascular;  Laterality: N/A;  . EYE SURGERY  2015,2016   bilateral cataract surgery with lens implants  . HERNIA REPAIR  03/2005  . JOINT REPLACEMENT  2005   total right hip replacement  . kidney stent  1987, 02/2005  . KIDNEY STONE SURGERY     2 stents placed  . KNEE ARTHROSCOPY WITH DRILLING/MICROFRACTURE Left 11/20/2013   Procedure: KNEE ARTHROSCOPY WITH DRILLING/MICROFRACTURE;  Surgeon: Jacki Cones, MD;  Location: [facility] ORS;  Service: Orthopedics;  Laterality: Left;  . KNEE ARTHROSCOPY WITH MEDIAL MENISECTOMY Left 11/20/2013   Procedure: LEFT KNEE ARTHROSCOPY WITH MEDIAL MENISECTOMY;  Surgeon: Jacki Cones, MD;   Location: [facility] ORS;  Service: Orthopedics;  Laterality: Left;  Microfracture of medial femoral chondyle and abrasion chondroplasty of the medial femoral chondyle  . LITHOTRIPSY     x 2  . PICC line placement  2007  . quadrec  1999   LFT leg quad sx  . right hip surgery  2007   irrigation and debridement from bacteria  . TEE WITHOUT CARDIOVERSION N/A 12/02/2012   Procedure: TRANSESOPHAGEAL ECHOCARDIOGRAM (TEE);  Surgeon: [name], MD;  Location: [facility] ENDOSCOPY;  Service: Cardiovascular;  Laterality: N/A;  . TOTAL HIP ARTHROPLASTY  Left 08/12/2015   Procedure: LEFT TOTAL HIP ARTHROPLASTY;  Surgeon: Ranee Gosselinonald Gioffre, MD;  Location: [facility] ORS;  Service: Orthopedics;  Laterality: Left;    Family History  Problem Relation Age of Onset  . Cancer Mother 3783    CANCER  . Heart disease Father [id number]    HEART PROBLEMS  . Lupus Brother     "of the skin"    Social History   Social History  . Marital status: Married    Spouse name: N/A  . Number of children: N/A  . Years of education: N/A   Occupational History  . [employer] Retired   Social History Main Topics  . Smoking status: Never Smoker  . Smokeless tobacco: Never Used  . Alcohol use No  . Drug use: No  . Sexual activity: Not Asked   Other Topics Concern  . None   Social History Narrative   [place].  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   [employer]      [place] [facility]:   Has worked extensively in [employer]. Significant asbestos asbestos exposure. No bird or mold exposure.       Objective:   Physical Exam BP 136/74 (BP Location: Right Arm, Cuff Size: Normal)   Pulse 63   Ht 5\' 10"  (1.778 m)   Wt 203 lb (92.1 kg)   SpO2 97%   BMI 29.13 kg/m  General:  Awake. Alert. No acute distress.   Integument:  Warm & dry. No rash on exposed skin. HEENT:  Moist mucus membranes. No oral ulcers. No scleral injection or icterus.  Cardiovascular:  Regular rate. No edema. No  appreciable JVD. Pulmonary:  Bilateral basilar crackles. Otherwise good aeration. Normal work of breathing on room air. Abdomen: Soft. Normal bowel sounds. Nondistended.  Musculoskeletal:  Normal bulk and tone. No joint deformity or effusion appreciated.  SPLIT NIGHT SLEEP STUDY (11/14/12): Weight 195lbs. AHI 45 events/hr. Optimal CPAP pressure 11 cm H2O. Low saturation 82%. Baseline saturation 94%.  IMAGING CXR PA/LAT 05/03/16 (personally reviewed by me): No parenchymal nodule or opacity appreciated. Calcified pleural plaques noted. No pleural effusion. Heart normal in size & mediastinum normal in contour.    Assessment & Plan:  75 y.o. male with known underlying obstructive sleep apnea. Patient's current CPAP device is malfunctioning and needs to be replaced. We will be switching the patient over to an auto CPAP. On physical exam he does have basilar crackles and with his history of asbestos exposure evaluation for asbestosis or some other form of interstitial lung disease is necessary. I will also be checking full pulmonary function testing given his history of asbestos exposure. I instructed the patient to contact my office if he had any new breathing problems or questions before his next appointment.  1. OSA:  Switching to auto CPAP 5-12 centimeters H2O pressure with full facemask. Plan for compliance download after 4 weeks on new machine. 2. H/O Asbestos Exposure: Checking high-resolution CT scan without contrast and full pulmonary function testing. 3. Follow-up: Return to clinic in 6 weeks or sooner if needed.  Donna ChristenJennings E. Jamison NeighborNestor, M.D. [facility] Pager:  [phone] After 3pm or if no response, call [phone] 2:33 PM 06/08/16

## 2016-06-13 ENCOUNTER — Ambulatory Visit (INDEPENDENT_AMBULATORY_CARE_PROVIDER_SITE_OTHER)
Admission: RE | Admit: 2016-06-13 | Discharge: 2016-06-13 | Disposition: A | Discharge status: Home / Self Care | Payer: Medicare Other | Source: Ambulatory Visit | Attending: Pulmonary Disease | Admitting: Pulmonary Disease

## 2016-06-13 DIAGNOSIS — J8410 Pulmonary fibrosis, unspecified: Secondary | ICD-10-CM | POA: Diagnosis not present

## 2016-06-13 DIAGNOSIS — Z77090 Contact with and (suspected) exposure to asbestos: Secondary | ICD-10-CM

## 2016-06-29 ENCOUNTER — Telehealth: Payer: Self-pay | Admitting: Pulmonary Disease

## 2016-06-29 NOTE — Telephone Encounter (Signed)
Called and spoke to pt. Pt states [facility] called him on an incorrect phone number- an old temporary number. Pt states he would like the temporary phone number removed from his demographics, he no longer has this phon. This has been removed. Pt verbalized understanding and denied any further questions or concerns at this time.

## 2016-07-06 DIAGNOSIS — G4733 Obstructive sleep apnea (adult) (pediatric): Secondary | ICD-10-CM | POA: Diagnosis not present

## 2016-07-11 ENCOUNTER — Telehealth: Payer: Self-pay | Admitting: Pulmonary Disease

## 2016-07-11 NOTE — Telephone Encounter (Signed)
Spoke with a nurse through Medicare who had questions about the order that was placed for this pt. The questions that were asked were things that needs to go to [facility] They asked if the machine could be repaired before giving a new machine which we have no idea of and also they asked can their machine that they use be switched to a auto cpap as the provider is requesting. She has made a note that these questions need to go through [facility]. She states she understands and will reach out to them. Nothing further is needed at this time.

## 2016-07-18 ENCOUNTER — Telehealth: Payer: Self-pay | Admitting: Pulmonary Disease

## 2016-07-18 DIAGNOSIS — G4733 Obstructive sleep apnea (adult) (pediatric): Secondary | ICD-10-CM | POA: Diagnosis not present

## 2016-07-18 NOTE — Telephone Encounter (Signed)
Pt is very unhappy with [facility] regarding his cpap care- Pt picked up new cpap machine on 07/06/16.  Yesterday he received a call from [facility] stating that he had received his cpap machine too early (had old cpap machine X3.5 years, would not be eligible until having machine X5 years), and now is trying to make patient pay out of pocket for current machine since insurance won't cover it.   Called [facility], states that pt's insurance does not require a PA then they would not check to see if pt is actually eligible for a new cpap machine before delivering one- this is why pt was given a new cpap machine prematurely.  Because pt has a machine dispensed through [facility] in 2014, per pt's insurance he will have to have his old machine go through a "repair process" and rent a loaner cpap while this is being done, or pay for cpap out of pocket.  I advised Thereasa Distance that pt is not understanding of this and that they need to reach out to patient to help patient understand and weigh his options, and why this has happened.  Thereasa Distance states he will reach back out to patient to further explain.  lmtcb X1 for pt to make aware that [facility] will be reaching back out to him to further discuss his cpap issue with him.

## 2016-07-19 NOTE — Telephone Encounter (Signed)
Spoke with pt. He states that [facility] reached out to him yesterday. Everything has been handled and taken care of. Pt was very appreciative of our help yesterday. Nothing further was needed.

## 2016-08-02 ENCOUNTER — Encounter: Payer: Self-pay | Admitting: Pulmonary Disease

## 2016-08-02 ENCOUNTER — Telehealth: Payer: Self-pay | Admitting: Pulmonary Disease

## 2016-08-02 ENCOUNTER — Ambulatory Visit (INDEPENDENT_AMBULATORY_CARE_PROVIDER_SITE_OTHER): Payer: Medicare Other | Admitting: Pulmonary Disease

## 2016-08-02 VITALS — BP 130/70 | HR 67 | Ht 70.0 in | Wt 200.8 lb

## 2016-08-02 DIAGNOSIS — J849 Interstitial pulmonary disease, unspecified: Secondary | ICD-10-CM | POA: Insufficient documentation

## 2016-08-02 DIAGNOSIS — G4733 Obstructive sleep apnea (adult) (pediatric): Secondary | ICD-10-CM

## 2016-08-02 DIAGNOSIS — Z77090 Contact with and (suspected) exposure to asbestos: Secondary | ICD-10-CM | POA: Diagnosis not present

## 2016-08-02 DIAGNOSIS — Z9989 Dependence on other enabling machines and devices: Secondary | ICD-10-CM | POA: Diagnosis not present

## 2016-08-02 LAB — PULMONARY FUNCTION TEST
DL/VA % pred: 68 %
DL/VA: 3.15 ml/min/mmHg/L
DLCO COR % PRED: 58 %
DLCO UNC % PRED: 55 %
DLCO UNC: 18.22 ml/min/mmHg
DLCO cor: 19.28 ml/min/mmHg
FEF 25-75 POST: 5.62 L/s
FEF 25-75 PRE: 3.73 L/s
FEF2575-%Change-Post: 50 %
FEF2575-%PRED-POST: 262 %
FEF2575-%Pred-Pre: 174 %
FEV1-%Change-Post: 11 %
FEV1-%PRED-POST: 113 %
FEV1-%Pred-Pre: 101 %
FEV1-POST: 3.44 L
FEV1-PRE: 3.09 L
FEV1FVC-%Change-Post: 1 %
FEV1FVC-%PRED-PRE: 117 %
FEV6-%Change-Post: 9 %
FEV6-%PRED-POST: 101 %
FEV6-%Pred-Pre: 92 %
FEV6-Post: 4.02 L
FEV6-Pre: 3.66 L
FEV6FVC-%PRED-POST: 106 %
FEV6FVC-%Pred-Pre: 106 %
FVC-%Change-Post: 9 %
FVC-%Pred-Post: 95 %
FVC-%Pred-Pre: 86 %
FVC-Post: 4.02 L
FVC-Pre: 3.66 L
POST FEV6/FVC RATIO: 100 %
PRE FEV1/FVC RATIO: 84 %
Post FEV1/FVC ratio: 86 %
Pre FEV6/FVC Ratio: 100 %
RV % PRED: 30 %
RV: 0.82 L
TLC % PRED: 68 %
TLC: 4.91 L

## 2016-08-02 NOTE — Telephone Encounter (Signed)
IMAGING HRCT CHEST W/O 06/13/16 (personally reviewed by me): Lateral calcified pleural plaques. No pleural effusion appreciated. No pericardial effusion. No pathologic mediastinal adenopathy. Mild thickening of the esophagus. Minimal fibrotic changes in the dependent bases bilaterally right greater than left with a somewhat central distribution. No bronchiectasis or groundglass opacities. No honeycombing changes.

## 2016-08-02 NOTE — Progress Notes (Signed)
Subjective:    Patient ID: Charles [name], male    DOB: 1941/01/18, 76 y.o.   MRN: [id number]  C.C.:  Follow-up for OSA & H/O Asbestos Exposure.  HPI OSA: Currently on CPAP therapy at 11 cm H2O pressure. Attempted to switch to AutoPap at last appointment. He has been approved for his new machine. He reports the face mask is a bit larger than his last but is still a medium. He is sleeping well through the night. His wife reports he has not had any snoring. He does a 15-20 minute mid-day nap.   H/O Asbestos Exposure:  Patient does have calcified pleural plaques from previous exposure. Also have restriction that is mild on lung volumes likely from exposure. There is some suggestion of fibrosis particularly in the right lower lobe on high-resolution CT imaging which could represent asbestosis. He denies any dyspnea. He reports mild, intermittent cough. No wheezing.  Review of Systems No chest pain or pressure. No fever, chills, or sweats. No bruising. Has recently had a rash due to antibiotics. Does have some rare joint pain in his thumbs. No joint swelling or erythema.   Allergies  Allergen Reactions  . Ceftriaxone Sodium Rash    Rocephin  . Diltiazem Hcl Rash    Cardizem  . Penicillins Rash         Current Outpatient Prescriptions on File Prior to Visit  Medication Sig Dispense Refill  . acetaminophen (TYLENOL ARTHRITIS PAIN) 650 MG CR tablet Take 650 mg by mouth every 8 (eight) hours as needed for pain.     Marland Kitchen amiodarone (PACERONE) 200 MG tablet Take 1 tablet (200 mg total) by mouth daily. (Patient taking differently: Take 100 mg by mouth daily. ) 30 tablet 3  . Ascorbic Acid (VITAMIN C) 500 MG tablet Take 500-1,000 mg by mouth 2 (two) times daily. Pt takes two tablets in the morning and one tablet in the evening.    . Cholecalciferol (VITAMIN D) 2000 UNITS CAPS Take 4,000 Units by mouth every morning.     . Coenzyme Q10 (COQ10 PO) Take 100 mg by mouth every morning.     . edoxaban  ([name]) 60 MG TABS tablet Take 60 mg by mouth daily. 30 tablet 6  . enalapril (VASOTEC) 20 MG tablet TAKE ONE-HALF TABLET BY MOUTH ONCE DAILY IN THE EVENING 45 tablet 3  . furosemide (LASIX) 40 MG tablet TAKE ONE & ONE-HALF TABLETS BY MOUTH ONCE DAILY IN THE MORNING 90 tablet 1  . guaiFENesin (MUCINEX) 600 MG 12 hr tablet Take 600 mg by mouth 2 (two) times daily as needed for congestion. Reported on 12/14/2015    . levothyroxine (SYNTHROID, LEVOTHROID) 75 MCG tablet Take 1 tablet (75 mcg total) by mouth daily before breakfast.    . magnesium oxide (MAG-OX) 400 MG tablet Take 400 mg by mouth daily at 12 noon.    . metolazone (ZAROXOLYN) 5 MG tablet Take 1 tablet (5 mg total) by mouth daily as needed (fluid retention). 15 tablet 1  . metoprolol succinate (TOPROL-XL) 50 MG 24 hr tablet Take 1 tablet in the morning and 1/2 tablet in the evening. 135 tablet 3  . Misc Natural Products (OSTEO BI-FLEX JOINT SHIELD PO) Take 2 tablets by mouth every morning.     . Multiple Vitamin (MULTIVITAMIN) tablet Take 1 tablet by mouth every morning.     . Omega-3 Fatty Acids (FISH OIL) 1000 MG CAPS Take 1 capsule by mouth 2 (two) times daily.     Marland Kitchen  omeprazole (PRILOSEC) 20 MG capsule Take 20 mg by mouth every other day.     . polyvinyl alcohol (LIQUIFILM TEARS) 1.4 % ophthalmic solution Place 1 drop into both eyes daily as needed for dry eyes.    Marland Kitchen zinc gluconate 50 MG tablet Take 50 mg by mouth every morning.      No current facility-administered medications on file prior to visit.     Past Medical History:  Diagnosis Date  . Arthritis   . Atrial dilatation    mild biatrial dilitation  . Bruises easily   . CHF (congestive heart failure) (HCC)   . CKD (chronic kidney disease), stage III   . Coronary artery disease   . Dysrhythmia    atrial fibrillation  . GERD (gastroesophageal reflux disease)   . H/O hiatal hernia   . Hypercholesterolemia   . Hyperglycemia   . Hypertension   . Hypothyroidism   .  Mitral regurgitation    a. Mild by echo in 01/2014.  Marland Kitchen Nonischemic cardiomyopathy (HCC)    a. presumed to be tachycardia mediated. Varying EFs over the years from 35-40% to normal and back.  . Paroxysmal atrial fibrillation (HCC)    a. H/o difficult to control. b. s/p PVI ablation 12-03-2012 by [name] with recurrence afterwards, requiring period of amiodarone.  . Peripheral edema   . Sleep apnea    USES C PAP  . Tear of medial meniscus of left knee   . Tremor     Past Surgical History:  Procedure Laterality Date  . ATRIAL FIBRILLATION ABLATION N/A 12/03/2012   Procedure: ATRIAL FIBRILLATION ABLATION;  Surgeon: Hillis Range, MD;  Location[facility] CATH LAB;  Service: Cardiovascular;  Laterality: N/A;  . ATRIAL FIBRILLATION ABLATION     PVI by [name]  . CARDIAC CATHETERIZATION    . CARDIOVERSION N/A 09/04/2012   Procedure: CARDIOVERSION;  Surgeon: [name], MD;  Location: [facility] ENDOSCOPY;  Service: Cardiovascular;  Laterality: N/A;  . CARDIOVERSION N/A 02/11/2013   Procedure: CARDIOVERSION;  Surgeon: [name], MD;  Location: [facility] ENDOSCOPY;  Service: Cardiovascular;  Laterality: N/A;  . CARDIOVERSION N/A 12/07/2015   Procedure: CARDIOVERSION;  Surgeon: Chrystie Nose, MD;  Location: [facility] ENDOSCOPY;  Service: Cardiovascular;  Laterality: N/A;  . EYE SURGERY  2015,2016   bilateral cataract surgery with lens implants  . HERNIA REPAIR  03/2005  . JOINT REPLACEMENT  2005   total right hip replacement  . kidney stent  1987, 02/2005  . KIDNEY STONE SURGERY     2 stents placed  . KNEE ARTHROSCOPY WITH DRILLING/MICROFRACTURE Left 11/20/2013   Procedure: KNEE ARTHROSCOPY WITH DRILLING/MICROFRACTURE;  Surgeon: Jacki Cones, MD;  Location: [facility] ORS;  Service: Orthopedics;  Laterality: Left;  . KNEE ARTHROSCOPY WITH MEDIAL MENISECTOMY Left 11/20/2013   Procedure: LEFT KNEE ARTHROSCOPY WITH MEDIAL MENISECTOMY;  Surgeon: Jacki Cones, MD;  Location: [facility] ORS;  Service: Orthopedics;   Laterality: Left;  Microfracture of medial femoral chondyle and abrasion chondroplasty of the medial femoral chondyle  . LITHOTRIPSY     x 2  . PICC line placement  2007  . quadrec  1999   LFT leg quad sx  . right hip surgery  2007   irrigation and debridement from bacteria  . TEE WITHOUT CARDIOVERSION N/A 12/02/2012   Procedure: TRANSESOPHAGEAL ECHOCARDIOGRAM (TEE);  Surgeon: [name], MD;  Location: [facility] ENDOSCOPY;  Service: Cardiovascular;  Laterality: N/A;  . TOTAL HIP ARTHROPLASTY Left 08/12/2015   Procedure: LEFT TOTAL HIP ARTHROPLASTY;  Surgeon: Ranee Gosselin, MD;  Location: [facility] ORS;  Service: Orthopedics;  Laterality: Left;    Family History  Problem Relation Age of Onset  . Cancer Mother 46    CANCER  . Heart disease Father 97    HEART PROBLEMS  . Lupus Brother     "of the skin"    Social History   Social History  . Marital status: Married    Spouse name: N/A  . Number of children: N/A  . Years of education: N/A   Occupational History  . Systems developer Retired   Social History Main Topics  . Smoking status: Never Smoker  . Smokeless tobacco: Never Used  . Alcohol use No  . Drug use: No  . Sexual activity: Not Asked   Other Topics Concern  . None   Social History Narrative   Lives in [place] with spouse.  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   [employer]:   Has worked extensively in [employer]. Significant asbestos asbestos exposure. No bird or mold exposure.       Objective:   Physical Exam BP 130/70 (BP Location: Right Arm, Patient Position: Sitting, Cuff Size: Normal)   Pulse 67   Ht 5\' 10"  (1.778 m)   Wt 200 lb 12.8 oz (91.1 kg) Comment: per pt  SpO2 97%   BMI 28.81 kg/m   General:  Awake. Alert. No distress. Accompanied by wife today. Integument:  Warm & dry. No rash on exposed skin.  Extremities:  No cyanosis or clubbing.  HEENT:  Moist mucus membranes. No oral ulcers. No scleral  injection or icterus. No nasal turbinate swelling. Cardiovascular:  Regular rate. NoNormal S1 & S2. Pulmonary:  Basilar crackles right greater than left. No accessory muscle use on room air. Speaking in complete sentences. Abdomen: Soft. Nondistended. Nontender. Musculoskeletal: No joint deformity or effusion appreciated. Normal muscle bulk.   PFT 08/02/16: FVC 3.66 L (86%) FEV1 3.09 L (101%) FEV1/FVC 0.84 FEF 25-75 3.73 L (174%) negative bronchodilator response TLC 4.91 L (68%) RV 30% ERV 224% DLCO corrected 58%  SPLIT NIGHT SLEEP STUDY (11/14/12): Weight 195lbs. AHI 45 events/hr. Optimal CPAP pressure 11 cm H2O. Low saturation 82%. Baseline saturation 94%.  IMAGING HRCT CHEST W/O 06/13/16 (personally reviewed by me): Lateral calcified pleural plaques. No pleural effusion appreciated. No pericardial effusion. No pathologic mediastinal adenopathy. Mild thickening of the esophagus. Minimal fibrotic changes in the dependent bases bilaterally right greater than left with a somewhat central distribution. No bronchiectasis or groundglass opacities. No honeycombing changes.  CXR PA/LAT 05/03/16 (previously reviewed by me): No parenchymal nodule or opacity appreciated. Calcified pleural plaques noted. No pleural effusion. Heart normal in size & mediastinum normal in contour.    Assessment & Plan:  76 y.o. male with obstructive sleep apnea currently on CPAP therapy as well as mild restrictive lung disease, interstitial lung disease, and pleural plaque formation likely secondary to prior asbestos exposure. Patient has no symptoms that would suggest an underlying autoimmune cause to his interstitial lung disease. He seems to be tolerating his home CPAP device well with good quality of sleep. I am holding off on surgical lung biopsy and further serum autoimmune workup at this time. If patient's pulmonary function declines I would consider repeating a CT scan earlier or continuing with a serum workup. I  instructed the patient contact my office if he had any new breathing problems or questions before next appointment.  1. OSA:  Continuing patient on AutoPap at current settings. Excellent compliance per patient report and good sleep quality. 2. ILD: Likely secondary to prior asbestos exposure. Repeating spirometry with DLCO and 6 minute walk test at next appointment. Consider repeat high-resolution CT chest without contrast in one year. 3. H/O Asbestos Exposure: Approaching 60 years ago. Continuing to monitor pulmonary function testing. 4. Health Maintenance:  S/P Influenza & Pneumovax 23 vaccines.  5. Follow-up: Return to clinic in   [place] E. Jamison Neighbor, M.D. [facility] Pulmonary & Critical Care Pager:  [phone] After 3pm or if no response, call [phone] 2:30 PM 08/02/16

## 2016-08-02 NOTE — Progress Notes (Signed)
Test reviewed.  

## 2016-08-02 NOTE — Patient Instructions (Signed)
   Call me if you have any new breathing problems or questions before your next appointment.  I will see you back in 3 months with a breathing and walking test.  TESTS ORDERED: 1. Spirometry with DLCO at next appointment 2. on room air at next appointment

## 2016-08-31 DIAGNOSIS — H35371 Puckering of macula, right eye: Secondary | ICD-10-CM | POA: Diagnosis not present

## 2016-08-31 DIAGNOSIS — H26491 Other secondary cataract, right eye: Secondary | ICD-10-CM | POA: Diagnosis not present

## 2016-08-31 DIAGNOSIS — H40013 Open angle with borderline findings, low risk, bilateral: Secondary | ICD-10-CM | POA: Diagnosis not present

## 2016-08-31 DIAGNOSIS — H26492 Other secondary cataract, left eye: Secondary | ICD-10-CM | POA: Diagnosis not present

## 2016-09-06 ENCOUNTER — Telehealth: Payer: Self-pay | Admitting: Cardiology

## 2016-09-06 NOTE — Telephone Encounter (Signed)
Received records from [facility] for appointment on 11/06/16 with Dr Jens Som.  Records put with Dr Ludwig Clarks schedule on 11/06/16. lp

## 2016-09-13 ENCOUNTER — Other Ambulatory Visit: Payer: Medicare Other | Admitting: *Deleted

## 2016-09-13 DIAGNOSIS — E039 Hypothyroidism, unspecified: Secondary | ICD-10-CM | POA: Diagnosis not present

## 2016-09-13 LAB — TSH: TSH: 6.31 u[IU]/mL — ABNORMAL HIGH (ref 0.450–4.500)

## 2016-09-15 ENCOUNTER — Other Ambulatory Visit: Payer: Self-pay | Admitting: *Deleted

## 2016-09-15 DIAGNOSIS — E039 Hypothyroidism, unspecified: Secondary | ICD-10-CM

## 2016-09-15 MED ORDER — LEVOTHYROXINE SODIUM 100 MCG PO TABS: 100.0000 ug | ORAL_TABLET | Freq: Every day | ORAL | 3 refills | Status: DC

## 2016-10-17 ENCOUNTER — Encounter: Payer: Self-pay | Admitting: Cardiology

## 2016-10-31 ENCOUNTER — Other Ambulatory Visit: Payer: Self-pay | Admitting: Cardiology

## 2016-10-31 DIAGNOSIS — I4891 Unspecified atrial fibrillation: Secondary | ICD-10-CM

## 2016-10-31 NOTE — Progress Notes (Signed)
HPI: FU atrial fibrillation.  He has a past history of paroxysmal atrial fibrillation and tachycardia mediated cardiomyopathy. He underwent mapping and ablation of atrial fib in 11/2012 by Dr. [name]. He had early recurrence of atrial fib in 12/2012 requiring repeat DCCV. Dr. [name] restarted amiodarone 02/2013 to try and achieve and maintain NSR to allow the heart to remodel. In 09/2013 amiodarone was decreased and he was changed to [name] (edoxaban). Amiodarone was stopped in 01/2014 due to maintenance of NSR.   However, he was seen on 12/22/14 for recurrent atrial fibrillation. He was restarted on amiodarone. Subsequently converted back to normal sinus rhythm on amiodarone. The patient has developed some increased tremors of his hands from the amiodarone.Tremor has improved since we cut back on his amiodarone dose to just 100 mg daily. Last echocardiogram August 2015 showed normal LV systolic function, grade 2 diastolic dysfunction, mild mitral regurgitation and moderate left atrial enlargement. Carotid dopplers April 2017 normal. Had repeat cardioversion June 2017.  Since last seen, patient denies dyspnea, chest pain, palpitations or syncope. No bleeding.  Current Outpatient Prescriptions  Medication Sig Dispense Refill  . acetaminophen (TYLENOL ARTHRITIS PAIN) 650 MG CR tablet Take 650 mg by mouth every 8 (eight) hours as needed for pain.     Marland Kitchen amiodarone (PACERONE) 200 MG tablet TAKE ONE TABLET BY MOUTH ONCE DAILY 90 tablet 0  . Ascorbic Acid (VITAMIN C) 500 MG tablet Take 500-1,000 mg by mouth 2 (two) times daily. Pt takes two tablets in the morning and one tablet in the evening.    . Cholecalciferol (VITAMIN D) 2000 UNITS CAPS Take 4,000 Units by mouth every morning.     . Coenzyme Q10 (COQ10 PO) Take 100 mg by mouth every morning.     . enalapril (VASOTEC) 20 MG tablet TAKE ONE-HALF TABLET BY MOUTH ONCE DAILY IN THE EVENING 45 tablet 3  . furosemide (LASIX) 40 MG tablet TAKE ONE &  ONE-HALF TABLETS BY MOUTH ONCE DAILY IN THE MORNING 90 tablet 1  . guaiFENesin (MUCINEX) 600 MG 12 hr tablet Take 600 mg by mouth 2 (two) times daily as needed for congestion. Reported on 12/14/2015    . levothyroxine (SYNTHROID, LEVOTHROID) 100 MCG tablet Take 1 tablet (100 mcg total) by mouth daily before breakfast. 90 tablet 3  . magnesium oxide (MAG-OX) 400 MG tablet Take 400 mg by mouth daily at 12 noon.    . metolazone (ZAROXOLYN) 5 MG tablet Take 1 tablet (5 mg total) by mouth daily as needed (fluid retention). 15 tablet 1  . metoprolol succinate (TOPROL-XL) 50 MG 24 hr tablet Take 1 tablet in the morning and 1/2 tablet in the evening. 135 tablet 3  . Misc Natural Products (OSTEO BI-FLEX JOINT SHIELD PO) Take 2 tablets by mouth every morning.     . Multiple Vitamin (MULTIVITAMIN) tablet Take 1 tablet by mouth every morning.     . Omega-3 Fatty Acids (FISH OIL) 1000 MG CAPS Take 1 capsule by mouth 2 (two) times daily.     Marland Kitchen omeprazole (PRILOSEC) 20 MG capsule Take 20 mg by mouth every other day.     . polyvinyl alcohol (LIQUIFILM TEARS) 1.4 % ophthalmic solution Place 1 drop into both eyes daily as needed for dry eyes.    Marland Kitchen [name] 60 MG TABS tablet TAKE ONE TABLET BY MOUTH ONCE DAILY 90 tablet 0  . zinc gluconate 50 MG tablet Take 50 mg by mouth every morning.  No current facility-administered medications for this visit.      Past Medical History:  Diagnosis Date  . Arthritis   . Atrial dilatation    mild biatrial dilitation  . Bruises easily   . CHF (congestive heart failure) (HCC)   . CKD (chronic kidney disease), stage III   . Coronary artery disease   . Dysrhythmia    atrial fibrillation  . GERD (gastroesophageal reflux disease)   . H/O hiatal hernia   . Hypercholesterolemia   . Hyperglycemia   . Hypertension   . Hypothyroidism   . Mitral regurgitation    a. Mild by echo in 01/2014.  Marland Kitchen Nonischemic cardiomyopathy (HCC)    a. presumed to be tachycardia mediated.  Varying EFs over the years from 35-40% to normal and back.  . Paroxysmal atrial fibrillation (HCC)    a. H/o difficult to control. b. s/p PVI ablation 12-03-2012 by [name] with recurrence afterwards, requiring period of amiodarone.  . Peripheral edema   . Sleep apnea    USES C PAP  . Tear of medial meniscus of left knee   . Tremor     Past Surgical History:  Procedure Laterality Date  . ATRIAL FIBRILLATION ABLATION N/A 12/03/2012   Procedure: ATRIAL FIBRILLATION ABLATION;  Surgeon: Hillis Range, MD;  Location: [facility] CATH LAB;  Service: Cardiovascular;  Laterality: N/A;  . ATRIAL FIBRILLATION ABLATION     PVI by [name]  . CARDIAC CATHETERIZATION    . CARDIOVERSION N/A 09/04/2012   Procedure: CARDIOVERSION;  Surgeon: [name], MD;  Location: [facility] ENDOSCOPY;  Service: Cardiovascular;  Laterality: N/A;  . CARDIOVERSION N/A 02/11/2013   Procedure: CARDIOVERSION;  Surgeon: [name], MD;  Location: [facility] ENDOSCOPY;  Service: Cardiovascular;  Laterality: N/A;  . CARDIOVERSION N/A 12/07/2015   Procedure: CARDIOVERSION;  Surgeon: Chrystie Nose, MD;  Location: [facility] ENDOSCOPY;  Service: Cardiovascular;  Laterality: N/A;  . EYE SURGERY  2015,2016   bilateral cataract surgery with lens implants  . HERNIA REPAIR  03/2005  . JOINT REPLACEMENT  2005   total right hip replacement  . kidney stent  1987, 02/2005  . KIDNEY STONE SURGERY     2 stents placed  . KNEE ARTHROSCOPY WITH DRILLING/MICROFRACTURE Left 11/20/2013   Procedure: KNEE ARTHROSCOPY WITH DRILLING/MICROFRACTURE;  Surgeon: Jacki Cones, MD;  Location: [facility] ORS;  Service: Orthopedics;  Laterality: Left;  . KNEE ARTHROSCOPY WITH MEDIAL MENISECTOMY Left 11/20/2013   Procedure: LEFT KNEE ARTHROSCOPY WITH MEDIAL MENISECTOMY;  Surgeon: Jacki Cones, MD;  Location: [facility] ORS;  Service: Orthopedics;  Laterality: Left;  Microfracture of medial femoral chondyle and abrasion chondroplasty of the medial femoral chondyle  . LITHOTRIPSY     x  2  . PICC line placement  2007  . quadrec  1999   LFT leg quad sx  . right hip surgery  2007   irrigation and debridement from bacteria  . TEE WITHOUT CARDIOVERSION N/A 12/02/2012   Procedure: TRANSESOPHAGEAL ECHOCARDIOGRAM (TEE);  Surgeon: [name], MD;  Location: [facility] ENDOSCOPY;  Service: Cardiovascular;  Laterality: N/A;  . TOTAL HIP ARTHROPLASTY Left 08/12/2015   Procedure: LEFT TOTAL HIP ARTHROPLASTY;  Surgeon: Ranee Gosselin, MD;  Location: [facility] ORS;  Service: Orthopedics;  Laterality: Left;    Social History   Social History  . Marital status: Married    Spouse name: N/A  . Number of children: N/A  . Years of education: N/A   Occupational History  . Systems developer Retired   Social History  Main Topics  . Smoking status: Never Smoker  . Smokeless tobacco: Never Used  . Alcohol use No  . Drug use: No  . Sexual activity: Not on file   Other Topics Concern  . Not on file   Social History Narrative   Lives in [place] with spouse.  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   [employer] Pulmonary:   Has worked extensively in [employer]. Significant asbestos asbestos exposure. No bird or mold exposure.     Family History  Problem Relation Age of Onset  . Cancer Mother 71       CANCER  . Heart disease Father 58       HEART PROBLEMS  . Lupus Brother        "of the skin"    ROS: no fevers or chills, productive cough, hemoptysis, dysphasia, odynophagia, melena, hematochezia, dysuria, hematuria, rash, seizure activity, orthopnea, PND, pedal edema, claudication. Remaining systems are negative.  Physical Exam: Well-developed well-nourished in no acute distress.  Skin is warm and dry.  HEENT is normal.  Neck is supple. No bruit Chest with mild basilar crackles Cardiovascular exam is regular rate and rhythm.  Abdominal exam nontender or distended. No masses palpated. Extremities show no edema. neuro grossly intact  ECG-  Normal sinus rhythm with occasional PVC. Nonspecific ST changes. reviewed  A/P  1 paroxysmal atrial fibrillation-patient remains in sinus rhythm. Continue low-dose amiodarone. Check TSH, liver functions and chest x-ray in June. Continue edoxaban. Check Hgb and renal function.   2 hypertension-blood pressure controlled. Continue present medications. Check K and renal function.  3 chronic stage III kidney disease-check renal function.  Olga Millers, MD

## 2016-11-01 NOTE — Telephone Encounter (Signed)
REFILL 

## 2016-11-06 ENCOUNTER — Encounter: Payer: Self-pay | Admitting: Pulmonary Disease

## 2016-11-06 ENCOUNTER — Ambulatory Visit (INDEPENDENT_AMBULATORY_CARE_PROVIDER_SITE_OTHER): Payer: Medicare Other | Admitting: *Deleted

## 2016-11-06 ENCOUNTER — Ambulatory Visit (INDEPENDENT_AMBULATORY_CARE_PROVIDER_SITE_OTHER): Payer: Medicare Other | Admitting: Cardiology

## 2016-11-06 ENCOUNTER — Encounter: Payer: Self-pay | Admitting: Cardiology

## 2016-11-06 ENCOUNTER — Ambulatory Visit (INDEPENDENT_AMBULATORY_CARE_PROVIDER_SITE_OTHER): Payer: Medicare Other | Admitting: Pulmonary Disease

## 2016-11-06 ENCOUNTER — Other Ambulatory Visit: Payer: Self-pay | Admitting: *Deleted

## 2016-11-06 VITALS — BP 102/60 | HR 56 | Ht 70.0 in | Wt 194.6 lb

## 2016-11-06 VITALS — BP 128/64 | HR 59 | Ht 70.0 in | Wt 194.0 lb

## 2016-11-06 DIAGNOSIS — Z77090 Contact with and (suspected) exposure to asbestos: Secondary | ICD-10-CM | POA: Diagnosis not present

## 2016-11-06 DIAGNOSIS — Z9989 Dependence on other enabling machines and devices: Secondary | ICD-10-CM | POA: Diagnosis not present

## 2016-11-06 DIAGNOSIS — J849 Interstitial pulmonary disease, unspecified: Secondary | ICD-10-CM

## 2016-11-06 DIAGNOSIS — G4733 Obstructive sleep apnea (adult) (pediatric): Secondary | ICD-10-CM

## 2016-11-06 DIAGNOSIS — N183 Chronic kidney disease, stage 3 unspecified: Secondary | ICD-10-CM

## 2016-11-06 DIAGNOSIS — I10 Essential (primary) hypertension: Secondary | ICD-10-CM

## 2016-11-06 DIAGNOSIS — I480 Paroxysmal atrial fibrillation: Secondary | ICD-10-CM

## 2016-11-06 DIAGNOSIS — I4891 Unspecified atrial fibrillation: Secondary | ICD-10-CM

## 2016-11-06 LAB — PULMONARY FUNCTION TEST
DL/VA % pred: 60 %
DL/VA: 2.81 ml/min/mmHg/L
DLCO COR: 17.84 ml/min/mmHg
DLCO cor % pred: 54 %
DLCO unc % pred: 50 %
DLCO unc: 16.56 ml/min/mmHg
FEF 25-75 PRE: 4.02 L/s
FEF2575-%PRED-PRE: 188 %
FEV1-%Pred-Pre: 111 %
FEV1-PRE: 3.36 L
FEV1FVC-%Pred-Pre: 115 %
FEV6-%PRED-PRE: 103 %
FEV6-PRE: 4.06 L
FEV6FVC-%Pred-Pre: 106 %
FVC-%PRED-PRE: 96 %
FVC-PRE: 4.06 L
Pre FEV1/FVC ratio: 83 %
Pre FEV6/FVC Ratio: 100 %

## 2016-11-06 MED ORDER — OMEPRAZOLE 20 MG PO CPDR: 20.0000 mg | DELAYED_RELEASE_CAPSULE | ORAL | 3 refills | Status: DC

## 2016-11-06 MED ORDER — EDOXABAN TOSYLATE 60 MG PO TABS: 60.0000 mg | ORAL_TABLET | Freq: Every day | ORAL | 3 refills | Status: DC

## 2016-11-06 MED ORDER — FUROSEMIDE 40 MG PO TABS: ORAL_TABLET | ORAL | 3 refills | Status: DC

## 2016-11-06 MED ORDER — ENALAPRIL MALEATE 20 MG PO TABS: ORAL_TABLET | ORAL | 3 refills | Status: DC

## 2016-11-06 MED ORDER — AMIODARONE HCL 200 MG PO TABS: 200.0000 mg | ORAL_TABLET | Freq: Every day | ORAL | 3 refills | Status: DC

## 2016-11-06 NOTE — Progress Notes (Signed)
PFT done today. 

## 2016-11-06 NOTE — Patient Instructions (Signed)
Medication Instructions:   NO CHANGE  Labwork:  Your physician recommends that you HAVE LAB WORK TODAY  Testing/Procedures:  A chest x-ray takes a picture of the organs and structures inside the chest, including the heart, lungs, and blood vessels. This test can show several things, including, whether the heart is enlarges; whether fluid is building up in the lungs; and whether pacemaker / defibrillator leads are still in place. AT [facility] [place]  Follow-Up:  Your physician wants you to follow-up in: 6 MONTHS WITH DR Shelda Pal will receive a reminder letter in the mail two months in advance. If you don't receive a letter, please call our office to schedule the follow-up appointment.   If you need a refill on your cardiac medications before your next appointment, please call your pharmacy.

## 2016-11-06 NOTE — Progress Notes (Signed)
SIX MIN WALK 11/06/2016  Medications Amiodarone 200mg , Vitamin C 1000mg , Vitamin D 4000 units, CoQ10 100mg , lasix 60mg , synthroid 100 mcg, metoprolol 50mg , fish oil 1000mg , omeprazole 20mg , taken approx 8:00.   Supplimental Oxygen during Test? (L/min) No  Laps 8  Partial Lap (in Meters) 0  Baseline BP (sitting) 132/66  Baseline Heartrate 56  Baseline Dyspnea (Borg Scale) 2  Baseline Fatigue (Borg Scale) 0  Baseline SPO2 99  BP (sitting) 180/78  Heartrate 105  Dyspnea (Borg Scale) 2  Fatigue (Borg Scale) 1  SPO2 99  BP (sitting) 144/66  Heartrate 71  SPO2 100  Stopped or Paused before Six Minutes No  Distance Completed 384  Tech Comments: Pt walked a moderately fast pace, tolerated walk well, noted dyspnea on exertion.

## 2016-11-06 NOTE — Progress Notes (Signed)
Subjective:    Patient ID: Charles [name], male    DOB: [birth year], 76 y.o.   MRN: [id number]  C.C.:  Follow-up for ILD, OSA & H/O Asbestos Exposure.  HPI ILD: Likely secondary to prior asbestos exposure. He denies any new breathing problems. He denies any coughing or wheezing.     OSA: Currently prescribed AutoPap CPAP. He denies any new problems with his home CPAP. He is still taking very limited naps if at all. Wife reports no snoring when he sleeps. No significant mask leaks.   Asbestos exposure: Remote exposure. Does have calcified pleural plaques.   Review of Systems No chest pain, pressure or tightness. No fever or chills. No abdominal pain or nausea.   Allergies  Allergen Reactions  . Ceftriaxone Sodium Rash    Rocephin  . Clindamycin/Lincomycin Itching and Rash    Hoarse voice  . Diltiazem Hcl Rash    Cardizem  . Penicillins Rash         Current Outpatient Prescriptions on File Prior to Visit  Medication Sig Dispense Refill  . acetaminophen (TYLENOL ARTHRITIS PAIN) 650 MG CR tablet Take 650 mg by mouth every 8 (eight) hours as needed for pain.     Marland Kitchen amiodarone (PACERONE) 200 MG tablet TAKE ONE TABLET BY MOUTH ONCE DAILY 90 tablet 0  . Ascorbic Acid (VITAMIN C) 500 MG tablet Take 500-1,000 mg by mouth 2 (two) times daily. Pt takes two tablets in the morning and one tablet in the evening.    . Cholecalciferol (VITAMIN D) 2000 UNITS CAPS Take 4,000 Units by mouth every morning.     . Coenzyme Q10 (COQ10 PO) Take 100 mg by mouth every morning.     . enalapril (VASOTEC) 20 MG tablet TAKE ONE-HALF TABLET BY MOUTH ONCE DAILY IN THE EVENING 45 tablet 3  . furosemide (LASIX) 40 MG tablet TAKE ONE & ONE-HALF TABLETS BY MOUTH ONCE DAILY IN THE MORNING 90 tablet 1  . guaiFENesin (MUCINEX) 600 MG 12 hr tablet Take 600 mg by mouth 2 (two) times daily as needed for congestion. Reported on 12/14/2015    . levothyroxine (SYNTHROID, LEVOTHROID) 100 MCG tablet Take 1 tablet (100 mcg  total) by mouth daily before breakfast. 90 tablet 3  . magnesium oxide (MAG-OX) 400 MG tablet Take 400 mg by mouth daily at 12 noon.    . metolazone (ZAROXOLYN) 5 MG tablet Take 1 tablet (5 mg total) by mouth daily as needed (fluid retention). 15 tablet 1  . metoprolol succinate (TOPROL-XL) 50 MG 24 hr tablet Take 1 tablet in the morning and 1/2 tablet in the evening. 135 tablet 3  . Misc Natural Products (OSTEO BI-FLEX JOINT SHIELD PO) Take 2 tablets by mouth every morning.     . Multiple Vitamin (MULTIVITAMIN) tablet Take 1 tablet by mouth every morning.     . Omega-3 Fatty Acids (FISH OIL) 1000 MG CAPS Take 1 capsule by mouth 2 (two) times daily.     Marland Kitchen omeprazole (PRILOSEC) 20 MG capsule Take 20 mg by mouth every other day.     . polyvinyl alcohol (LIQUIFILM TEARS) 1.4 % ophthalmic solution Place 1 drop into both eyes daily as needed for dry eyes.    Marland Kitchen [name] 60 MG TABS tablet TAKE ONE TABLET BY MOUTH ONCE DAILY 90 tablet 0  . zinc gluconate 50 MG tablet Take 50 mg by mouth every morning.      No current facility-administered medications on file prior to visit.  Past Medical History:  Diagnosis Date  . Arthritis   . Atrial dilatation    mild biatrial dilitation  . Bruises easily   . CHF (congestive heart failure) (HCC)   . CKD (chronic kidney disease), stage III   . Coronary artery disease   . Dysrhythmia    atrial fibrillation  . GERD (gastroesophageal reflux disease)   . H/O hiatal hernia   . Hypercholesterolemia   . Hyperglycemia   . Hypertension   . Hypothyroidism   . Mitral regurgitation    a. Mild by echo in 01/2014.  Marland Kitchen Nonischemic cardiomyopathy (HCC)    a. presumed to be tachycardia mediated. Varying EFs over the years from 35-40% to normal and back.  . Paroxysmal atrial fibrillation (HCC)    a. H/o difficult to control. b. s/p PVI ablation 12-03-2012 by [name] with recurrence afterwards, requiring period of amiodarone.  . Peripheral edema   . Sleep apnea     USES C PAP  . Tear of medial meniscus of left knee   . Tremor     Past Surgical History:  Procedure Laterality Date  . ATRIAL FIBRILLATION ABLATION N/A 12/03/2012   Procedure: ATRIAL FIBRILLATION ABLATION;  Surgeon: Hillis Range, MD;  Location: [facility] CATH LAB;  Service: Cardiovascular;  Laterality: N/A;  . ATRIAL FIBRILLATION ABLATION     PVI by [name]  . CARDIAC CATHETERIZATION    . CARDIOVERSION N/A 09/04/2012   Procedure: CARDIOVERSION;  Surgeon: [name], MD;  Location: [facility] ENDOSCOPY;  Service: Cardiovascular;  Laterality: N/A;  . CARDIOVERSION N/A 02/11/2013   Procedure: CARDIOVERSION;  Surgeon: [name], MD;  Location: [facility] ENDOSCOPY;  Service: Cardiovascular;  Laterality: N/A;  . CARDIOVERSION N/A 12/07/2015   Procedure: CARDIOVERSION;  Surgeon: Chrystie Nose, MD;  Location: [facility] ENDOSCOPY;  Service: Cardiovascular;  Laterality: N/A;  . EYE SURGERY  2015,2016   bilateral cataract surgery with lens implants  . HERNIA REPAIR  03/2005  . JOINT REPLACEMENT  2005   total right hip replacement  . kidney stent  1987, 02/2005  . KIDNEY STONE SURGERY     2 stents placed  . KNEE ARTHROSCOPY WITH DRILLING/MICROFRACTURE Left 11/20/2013   Procedure: KNEE ARTHROSCOPY WITH DRILLING/MICROFRACTURE;  Surgeon: Jacki Cones, MD;  Location: [facility] ORS;  Service: Orthopedics;  Laterality: Left;  . KNEE ARTHROSCOPY WITH MEDIAL MENISECTOMY Left 11/20/2013   Procedure: LEFT KNEE ARTHROSCOPY WITH MEDIAL MENISECTOMY;  Surgeon: Jacki Cones, MD;  Location: [facility] ORS;  Service: Orthopedics;  Laterality: Left;  Microfracture of medial femoral chondyle and abrasion chondroplasty of the medial femoral chondyle  . LITHOTRIPSY     x 2  . PICC line placement  2007  . quadrec  1999   LFT leg quad sx  . right hip surgery  2007   irrigation and debridement from bacteria  . TEE WITHOUT CARDIOVERSION N/A 12/02/2012   Procedure: TRANSESOPHAGEAL ECHOCARDIOGRAM (TEE);  Surgeon: [name], MD;  Location: [facility]  ENDOSCOPY;  Service: Cardiovascular;  Laterality: N/A;  . TOTAL HIP ARTHROPLASTY Left 08/12/2015   Procedure: LEFT TOTAL HIP ARTHROPLASTY;  Surgeon: Ranee Gosselin, MD;  Location: [facility] ORS;  Service: Orthopedics;  Laterality: Left;    Family History  Problem Relation Age of Onset  . Cancer Mother 28       CANCER  . Heart disease Father 53       HEART PROBLEMS  . Lupus Brother        "of the skin"    Social History  Social History  . Marital status: Married    Spouse name: N/A  . Number of children: N/A  . Years of education: N/A   Occupational History  . Systems developer Retired   Social History Main Topics  . Smoking status: Never Smoker  . Smokeless tobacco: Never Used  . Alcohol use No  . Drug use: No  . Sexual activity: Not Asked   Other Topics Concern  . None   Social History Narrative   Lives in [place] with spouse.  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   [employer] Pulmonary:   Has worked extensively in [employer]. Significant asbestos asbestos exposure. No bird or mold exposure.       Objective:   Physical Exam BP 102/60 (BP Location: Right Arm, Patient Position: Sitting, Cuff Size: Normal)   Pulse (!) 56   Ht 5\' 10"  (1.778 m)   Wt 194 lb 9.6 oz (88.3 kg)   SpO2 98%   BMI 27.92 kg/m    General:  No distress. Comfortable. Wife with patient today.  Integument:  No rash. Warm. Dry. Extremities:  No cyanosis or clubbing.  HEENT:  No nasal turbinate swelling. No oral ulcers. Moist mucus membranes. Cardiovascular:  Regular rhythm. Slightly bradycardic. No edema. Pulmonary:  Minimal basilar crackles right greater than left unchanged. No accessory muscle use on room air. Abdomen: Soft. Normal bowel sounds. Nondistended.  Neurological: Cranial nerves grossly intact. No meningismus. Oriented 4.  PFT 11/06/16: FVC 4.06 L (86%) FEV1 3.36 L (111%) FEV1/FVC 0.83 FEF 25-75 4.02 L (188%)                                                                                                                              DLCO corrected 54% 08/02/16: FVC 3.66 L (86%) FEV1 3.09 L (101%) FEV1/FVC 0.84 FEF 25-75 3.73 L (174%) negative bronchodilator response TLC 4.91 L (68%) RV 30% ERV 224% DLCO corrected 58%  11/06/16:  Walked 384 meters / Baseline Sat 99% on RA / Nadir Sat 99% on RA  SPLIT NIGHT SLEEP STUDY (11/14/12): Weight 195lbs. AHI 45 events/hr. Optimal CPAP pressure 11 cm H2O. Low saturation 82%. Baseline saturation 94%.  IMAGING HRCT CHEST W/O 06/13/16 (previously reviewed by me): Lateral calcified pleural plaques. No pleural effusion appreciated. No pericardial effusion. No pathologic mediastinal adenopathy. Mild thickening of the esophagus. Minimal fibrotic changes in the dependent bases bilaterally right greater than left with a somewhat central distribution. No bronchiectasis or groundglass opacities. No honeycombing changes.  CXR PA/LAT 05/03/16 (previously reviewed by me): No parenchymal nodule or opacity appreciated. Calcified pleural plaques noted. No pleural effusion. Heart normal in size & mediastinum normal in contour.    Assessment & Plan:  76 y.o. male with underlying ILD, OSA, and history of asbestos exposure. Interstitial lung disease is likely secondary to patient's prior history of asbestos exposure.Patient's spirometry today shows significant improvement  since previous testing and walk test shows no evidence of significant desaturation. I suspect the patient's walk test distance is only limited by his chronic hip pain. Given his history of asbestos exposure and pleural plaques on imaging asbestosis is the likely cause for what I am seeing on his high-resolution CT scan. However, this does not completely rule out alternative possibilities. We will need to continue to monitor his pulmonary function to ensure that there is no decline that would prompt further and more in-depth investigation. I  instructed the patient contact my office if he had any new breathing problems or questions before his next appointment.  1. ZOX:WRUEAV secondary to asbestos. Holding on immunosuppression. Repeat spirometry with DLCO and 6 minute walk test on room air at next appointment. 2. OSA: Continuing on AutoPap indefinitely. No changes. 3. History of asbestos exposure: Remote. Monitoring with pulmonary function testing yearly. 4. Health maintenance: Status post influenza and Pneumovax vaccinations. 5. Follow-up:  Return to clinic in 6 months or sooner if needed.  Donna Christen Jamison Neighbor, M.D. [facility] Pulmonary & Critical Care Pager:  [phone] After 3pm or if no response, call [phone] 12:13 PM 11/06/16

## 2016-11-06 NOTE — Addendum Note (Signed)
Addended by: Freddi Starr on: 11/06/2016 03:41 PM   Modules accepted: Orders

## 2016-11-06 NOTE — Patient Instructions (Addendum)
   Call me if you have any new breathing problems or questions before your next appointment.  I will see you back in 6 months or sooner if needed.   TESTS ORDERED: 1. Spirometry with DLCO at next appointment 2. on room air at next appointment

## 2016-11-10 ENCOUNTER — Telehealth: Payer: Self-pay | Admitting: *Deleted

## 2016-11-10 ENCOUNTER — Other Ambulatory Visit: Payer: Medicare Other | Admitting: *Deleted

## 2016-11-10 DIAGNOSIS — I480 Paroxysmal atrial fibrillation: Secondary | ICD-10-CM | POA: Diagnosis not present

## 2016-11-10 DIAGNOSIS — I4891 Unspecified atrial fibrillation: Secondary | ICD-10-CM

## 2016-11-10 LAB — BASIC METABOLIC PANEL
BUN / CREAT RATIO: 30 — AB (ref 10–24)
BUN: 53 mg/dL — AB (ref 8–27)
CALCIUM: 9.8 mg/dL (ref 8.6–10.2)
CHLORIDE: 98 mmol/L (ref 96–106)
CO2: 23 mmol/L (ref 18–29)
Creatinine, Ser: 1.77 mg/dL — ABNORMAL HIGH (ref 0.76–1.27)
GFR calc Af Amer: 42 mL/min/{1.73_m2} — ABNORMAL LOW (ref >59)
GFR calc non Af Amer: 36 mL/min/{1.73_m2} — ABNORMAL LOW (ref >59)
GLUCOSE: 99 mg/dL (ref 65–99)
POTASSIUM: 4.4 mmol/L (ref 3.5–5.2)
Sodium: 140 mmol/L (ref 134–144)

## 2016-11-10 LAB — CBC
Hematocrit: 32 % — ABNORMAL LOW (ref 37.5–51.0)
Hemoglobin: 11 g/dL — ABNORMAL LOW (ref 13.0–17.7)
MCH: 31.1 pg (ref 26.6–33.0)
MCHC: 34.4 g/dL (ref 31.5–35.7)
MCV: 90 fL (ref 79–97)
PLATELETS: 210 10*3/uL (ref 150–379)
RBC: 3.54 x10E6/uL — ABNORMAL LOW (ref 4.14–5.80)
RDW: 12.9 % (ref 12.3–15.4)
WBC: 10 10*3/uL (ref 3.4–10.8)

## 2016-11-10 MED ORDER — EDOXABAN TOSYLATE 60 MG PO TABS: 30.0000 mg | ORAL_TABLET | Freq: Every day | ORAL | 3 refills | Status: DC

## 2016-11-10 NOTE — Telephone Encounter (Addendum)
-----   Message from [name], MD sent at 11/10/2016  4:46 PM EDT ----- Change savaysa to 30 mg daily; DC metolazone; bmet 2 weeks Olga Millers  Spoke with pt and wife, they repeated medication changes. Lab orders mailed to the pt

## 2016-11-24 DIAGNOSIS — I4891 Unspecified atrial fibrillation: Secondary | ICD-10-CM | POA: Diagnosis not present

## 2016-11-25 LAB — BASIC METABOLIC PANEL
BUN: 39 mg/dL — AB (ref 7–25)
CHLORIDE: 105 mmol/L (ref 98–110)
CO2: 23 mmol/L (ref 20–31)
Calcium: 9.4 mg/dL (ref 8.6–10.3)
Creat: 1.84 mg/dL — ABNORMAL HIGH (ref 0.70–1.18)
GLUCOSE: 169 mg/dL — AB (ref 65–99)
POTASSIUM: 4.1 mmol/L (ref 3.5–5.3)
SODIUM: 140 mmol/L (ref 135–146)

## 2016-12-05 ENCOUNTER — Encounter: Payer: Self-pay | Admitting: *Deleted

## 2016-12-12 ENCOUNTER — Other Ambulatory Visit: Payer: Self-pay | Admitting: Cardiology

## 2016-12-13 NOTE — Telephone Encounter (Signed)
REFILL 

## 2016-12-20 ENCOUNTER — Ambulatory Visit (HOSPITAL_COMMUNITY)
Admission: RE | Admit: 2016-12-20 | Discharge: 2016-12-20 | Disposition: A | Discharge status: Home / Self Care | Payer: Medicare Other | Source: Ambulatory Visit | Attending: Cardiology | Admitting: Cardiology

## 2016-12-20 DIAGNOSIS — I480 Paroxysmal atrial fibrillation: Secondary | ICD-10-CM | POA: Diagnosis not present

## 2016-12-20 DIAGNOSIS — J929 Pleural plaque without asbestos: Secondary | ICD-10-CM | POA: Insufficient documentation

## 2016-12-20 DIAGNOSIS — J90 Pleural effusion, not elsewhere classified: Secondary | ICD-10-CM | POA: Diagnosis not present

## 2017-01-11 IMAGING — CT CT CHEST HIGH RESOLUTION W/O CM
2 of 6 series · 14 of 36 positions shown, 17 images · non-contrast
Comparison: No prior chest CT. CT the abdomen and pelvis 10/11/2006
which partially visualized the lung bases.

CLINICAL DATA: 75-year-old male with history of cough. Asbestos
exposure. Evaluate for interstitial lung disease. Nonsmoker.

EXAM:
CT CHEST WITHOUT CONTRAST
TECHNIQUE: Multidetector CT imaging of the chest was performed following the
standard protocol without intravenous contrast. High resolution
imaging of the lungs, as well as inspiratory and expiratory imaging,
was performed.

[Series 2: high resolution · axial · 0.75mm/px · z∈[-304,-22]mm · 11 of 159 slices shown, 14 images]
[im 9/159  mediastinal]
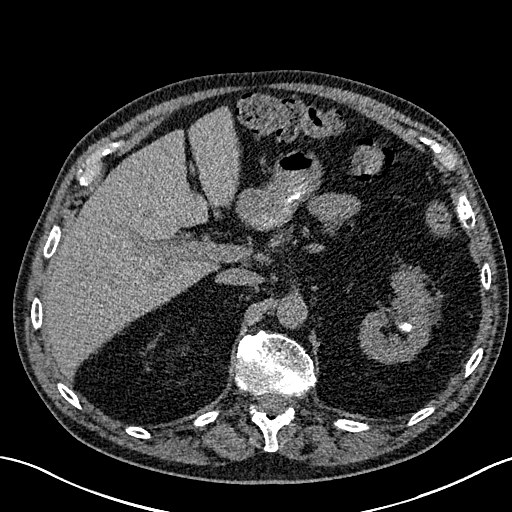
[im 9/159  lung]
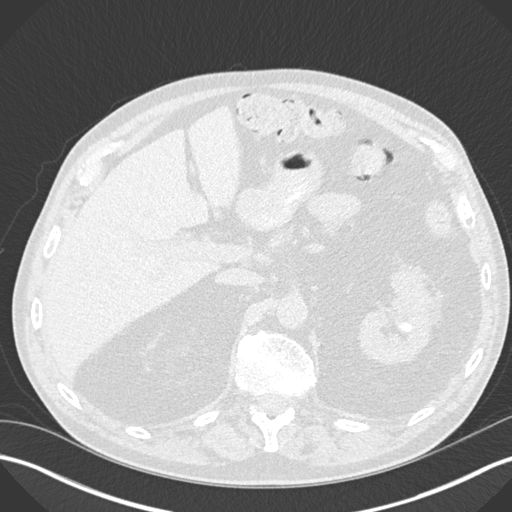
[im 25/159  lung]
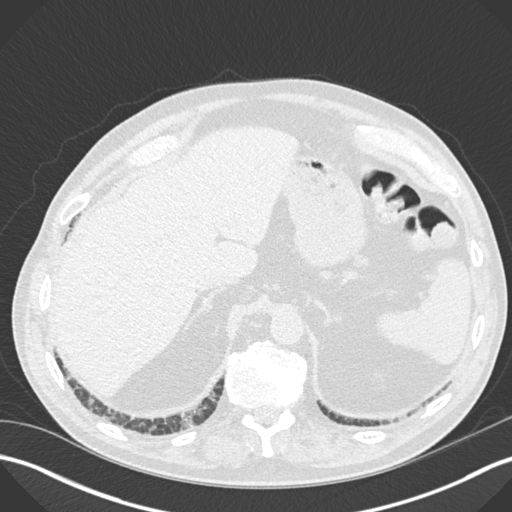
[im 42/159  lung]
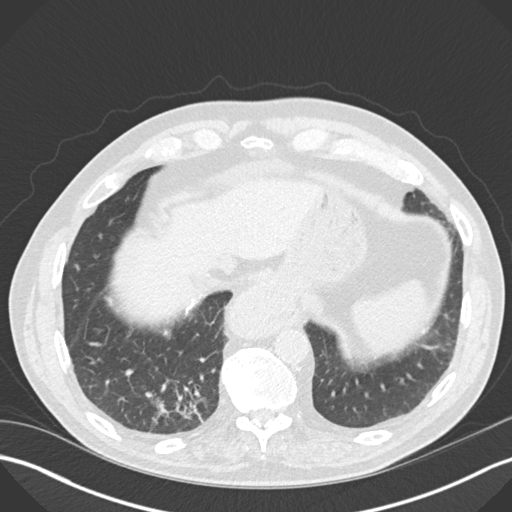
[im 50/159  lung]
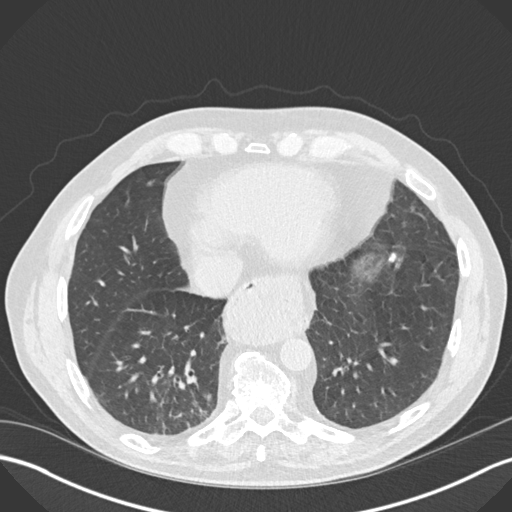
[im 67/159  mediastinal]
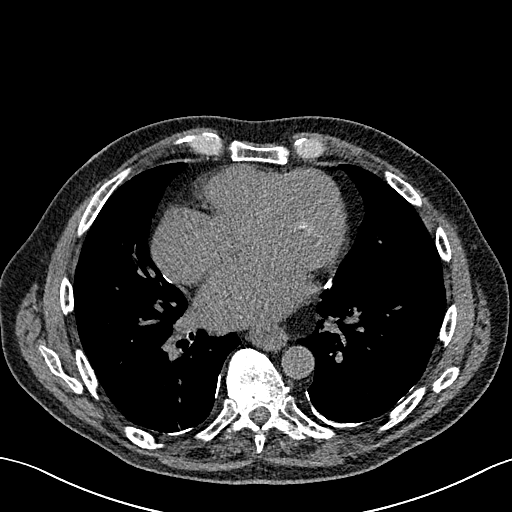
[im 67/159  lung]
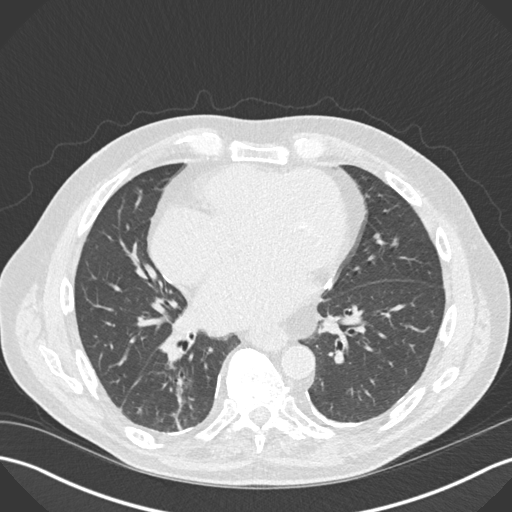
[im 84/159  lung]
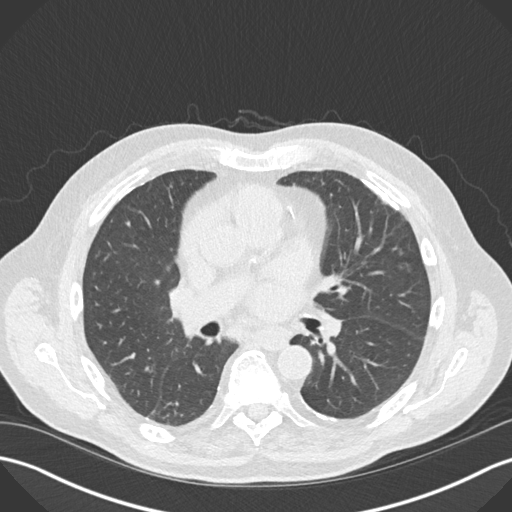
[im 92/159  lung]
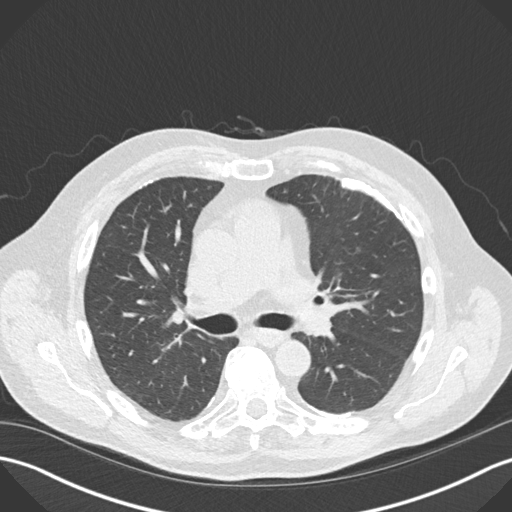
[im 109/159  lung]
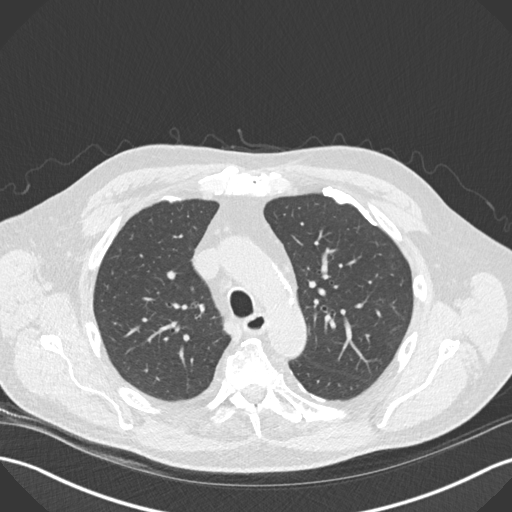
[im 117/159  mediastinal]
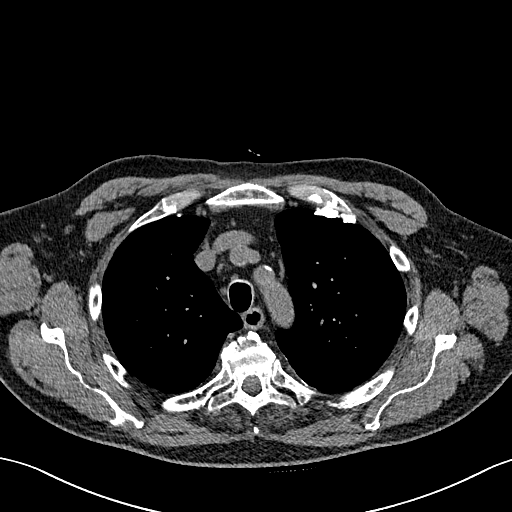
[im 117/159  lung]
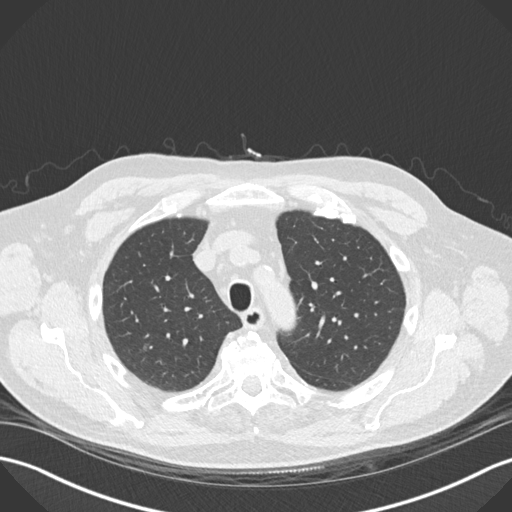
[im 134/159  lung]
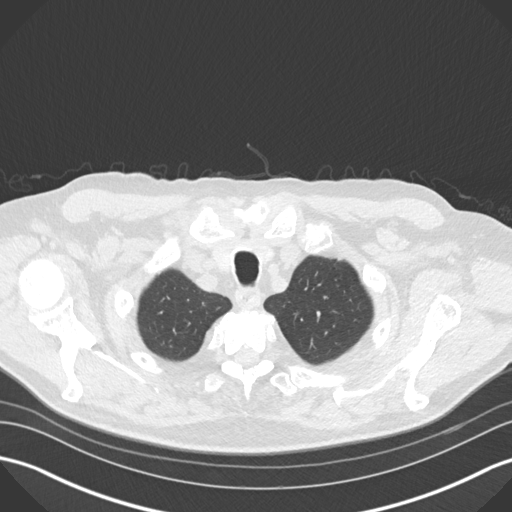
[im 150/159  lung]
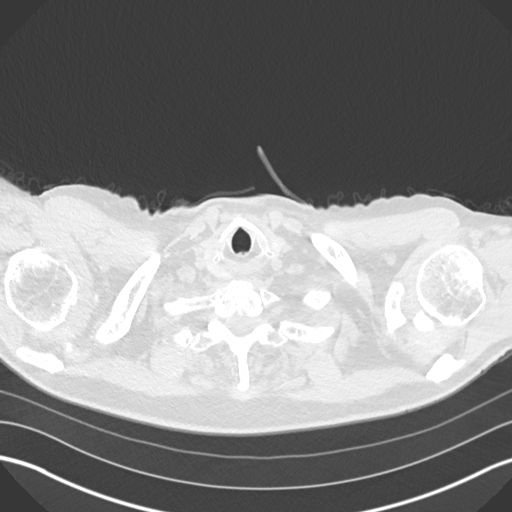

[Series 8: coronal · coronal · 0.63mm/px · 3 of 145 slices shown]
[im 29/145  lung]
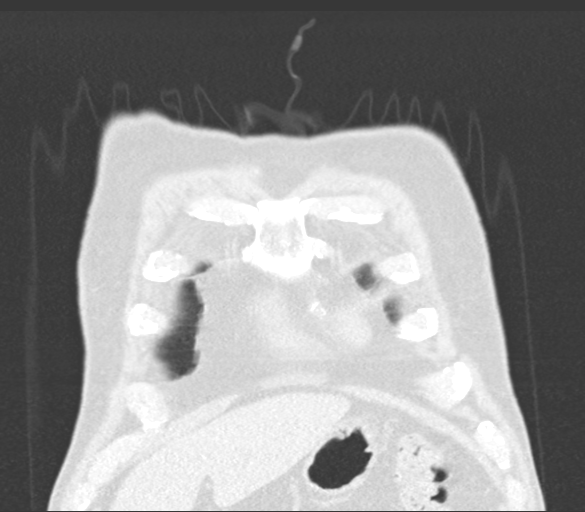
[im 58/145  lung]
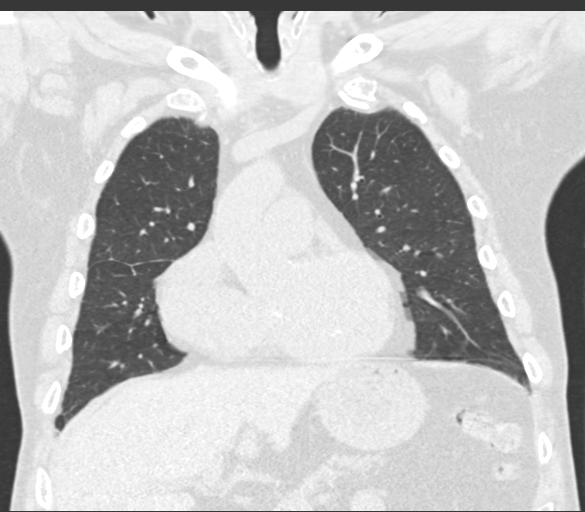
[im 87/145  lung]
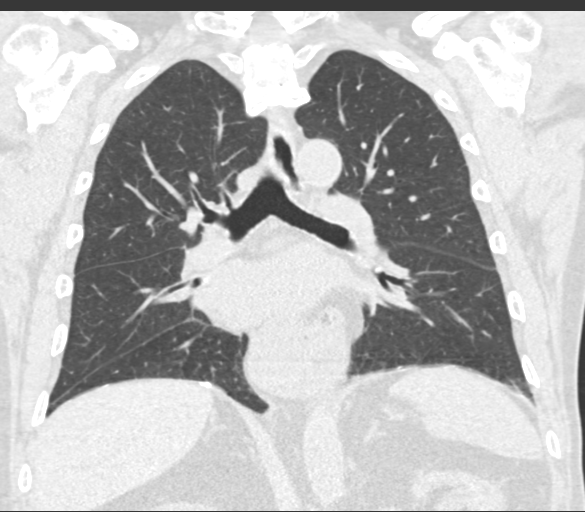

[14 of 36 positions shown; findings below may reference images not displayed]

FINDINGS: Cardiovascular: Heart size is mildly enlarged with left atrial
dilatation. There is no significant pericardial fluid, thickening or
pericardial calcification. There is aortic atherosclerosis, as well
as atherosclerosis of the great vessels of the mediastinum and the
coronary arteries, including calcified atherosclerotic plaque in the
left main, left anterior descending, left circumflex and right
coronary arteries. Calcifications of the mitral annulus and mitral
subvalvular apparatus.

Mediastinum/Nodes: No pathologically enlarged mediastinal or hilar
lymph nodes. Please note that accurate exclusion of hilar adenopathy
is limited on noncontrast CT scans. Moderate to large hiatal hernia.
No axillary lymphadenopathy.

Lungs/Pleura: Calcified pleural plaques are noted throughout the
thorax bilaterally, compatible with asbestos related pleural
disease. High-resolution images demonstrate some focal areas of
ground-glass attenuation, septal thickening, thickening of the
peribronchovascular interstitium and architectural distortion
centered posteriorly in the basal segments of the right lower lobe,
and in the posterior aspect of the superior segment of the right
lower (i.e., all dependently). In the periphery of the extreme lung
bases there are some additional areas of mild subpleural
reticulation in the left lower lobe, inferior segment of the lingula
and in the right middle lobe, which are very mild. Remaining
portions of the lungs do not demonstrate similar fibrotic changes.
No honeycombing is identified. Inspiratory and expiratory imaging
demonstrates some mild air trapping, indicative of mild small
airways disease. No acute consolidative airspace disease. No pleural
effusions. No definite suspicious appearing pulmonary nodules or
masses are noted.

Upper Abdomen: 17 mm nonobstructive calculus in the upper pole
collecting system of the left kidney. Aortic atherosclerosis.

Musculoskeletal: There are no aggressive appearing lytic or blastic
lesions noted in the visualized portions of the skeleton.
IMPRESSION: 1. Bilateral calcified pleural plaques, compatible with asbestos
related pleural disease.
2. There are some mild fibrotic changes, most severe in the
dependent portions of the right lower lobe. This distribution favors
areas of post infectious or inflammatory fibrosis. However, there
also very subtle areas of subpleural reticulation other areas of the
periphery of the lung bases bilaterally, which could be indicative
of very early interstitial lung disease. These findings appear to be
new compared to remote prior CT the abdomen and pelvis 10/11/2006
which visualized a portion of the lung bases. Repeat high-resolution
chest CT is suggested in 12 months to assess for further temporal
changes in the appearance of the lung parenchyma.
3. Aortic atherosclerosis, in addition to left main and 3 vessel
coronary artery disease. Assessment for potential risk factor
modification, dietary therapy or pharmacologic therapy may be
warranted, if clinically indicated.
4. 17 mm nonobstructive calculus in the upper pole collecting system
of the left kidney.
5. Moderate to large hiatal hernia.

## 2017-05-01 ENCOUNTER — Telehealth: Payer: Self-pay | Admitting: Cardiology

## 2017-05-01 NOTE — Telephone Encounter (Signed)
Pt would like to have another [employer] Card please.

## 2017-05-01 NOTE — Telephone Encounter (Signed)
Spoke with pt wife, will place savings card at the front desk for pick up.

## 2017-05-21 NOTE — Progress Notes (Deleted)
HPI: FU atrial fibrillation.  He has a past history of paroxysmal atrial fibrillation and tachycardia mediated cardiomyopathy. He underwent mapping and ablation of atrial fib in 11/2012 by Dr. Johney FrameAllred. He had early recurrence of atrial fib in 12/2012 requiring repeat DCCV. Dr. Johney FrameAllred restarted amiodarone 02/2013 to try and achieve and maintain NSR to allow the heart to remodel. In 09/2013 amiodarone was decreased and he was changed to [name] (edoxaban). Amiodarone was stopped in 01/2014 due to maintenance of NSR.   However, he was seen on 12/22/14 for recurrent atrial fibrillation. He was restarted on amiodarone. Subsequently converted back to normal sinus rhythm on amiodarone. The patient has developed some increased tremors of his hands from the amiodarone.Tremor has improved since we cut back on his amiodarone dose to just 100 mg daily. Last echocardiogram August 2015 showed normal LV systolic function, grade 2 diastolic dysfunction, mild mitral regurgitation and moderate left atrial enlargement. Carotid dopplers April 2017 normal. Had repeat cardioversion June 2017.  Since last seen,   Current Outpatient Medications  Medication Sig Dispense Refill  . acetaminophen (TYLENOL ARTHRITIS PAIN) 650 MG CR tablet Take 650 mg by mouth every 8 (eight) hours as needed for pain.     Marland Kitchen. amiodarone (PACERONE) 200 MG tablet Take 1 tablet (200 mg total) by mouth daily. 90 tablet 3  . Ascorbic Acid (VITAMIN C) 500 MG tablet Take 500-1,000 mg by mouth 2 (two) times daily. Pt takes two tablets in the morning and one tablet in the evening.    . Cholecalciferol (VITAMIN D) 2000 UNITS CAPS Take 4,000 Units by mouth every morning.     . Coenzyme Q10 (COQ10 PO) Take 100 mg by mouth every morning.     . edoxaban ([name]) 60 MG TABS tablet Take 30 mg by mouth daily. 90 tablet 3  . enalapril (VASOTEC) 20 MG tablet TAKE ONE-HALF TABLET BY MOUTH ONCE DAILY IN THE EVENING 45 tablet 3  . furosemide (LASIX) 40 MG  tablet TAKE ONE & ONE-HALF TABLETS BY MOUTH ONCE DAILY IN THE MORNING 135 tablet 3  . guaiFENesin (MUCINEX) 600 MG 12 hr tablet Take 600 mg by mouth 2 (two) times daily as needed for congestion. Reported on 12/14/2015    . levothyroxine (SYNTHROID, LEVOTHROID) 100 MCG tablet Take 1 tablet (100 mcg total) by mouth daily before breakfast. 90 tablet 3  . magnesium oxide (MAG-OX) 400 MG tablet Take 400 mg by mouth daily at 12 noon.    . metolazone (ZAROXOLYN) 5 MG tablet Take 1 tablet (5 mg total) by mouth daily as needed (fluid retention). 15 tablet 1  . metoprolol succinate (TOPROL-XL) 50 MG 24 hr tablet TAKE ONE TABLET BY MOUTH IN THE MORNING AND ONE-HALF TABLET IN THE EVENING. 135 tablet 1  . Misc Natural Products (OSTEO BI-FLEX JOINT SHIELD PO) Take 2 tablets by mouth every morning.     . Multiple Vitamin (MULTIVITAMIN) tablet Take 1 tablet by mouth every morning.     . Omega-3 Fatty Acids (FISH OIL) 1000 MG CAPS Take 1 capsule by mouth 2 (two) times daily.     Marland Kitchen. omeprazole (PRILOSEC) 20 MG capsule Take 1 capsule (20 mg total) by mouth every other day. 45 capsule 3  . polyvinyl alcohol (LIQUIFILM TEARS) 1.4 % ophthalmic solution Place 1 drop into both eyes daily as needed for dry eyes.    Marland Kitchen. zinc gluconate 50 MG tablet Take 50 mg by mouth every morning.      No current  facility-administered medications for this visit.      Past Medical History:  Diagnosis Date  . Arthritis   . Atrial dilatation    mild biatrial dilitation  . Bruises easily   . CHF (congestive heart failure) (HCC)   . CKD (chronic kidney disease), stage III   . Coronary artery disease   . Dysrhythmia    atrial fibrillation  . GERD (gastroesophageal reflux disease)   . H/O hiatal hernia   . Hypercholesterolemia   . Hyperglycemia   . Hypertension   . Hypothyroidism   . Mitral regurgitation    a. Mild by echo in 01/2014.  Marland Kitchen Nonischemic cardiomyopathy (HCC)    a. presumed to be tachycardia mediated. Varying EFs over the  years from 35-40% to normal and back.  . Paroxysmal atrial fibrillation (HCC)    a. H/o difficult to control. b. s/p PVI ablation 12-03-2012 by [name] with recurrence afterwards, requiring period of amiodarone.  . Peripheral edema   . Sleep apnea    USES C PAP  . Tear of medial meniscus of left knee   . Tremor     Past Surgical History:  Procedure Laterality Date  . ATRIAL FIBRILLATION ABLATION N/A 12/03/2012   Procedure: ATRIAL FIBRILLATION ABLATION;  Surgeon: Hillis Range, MD;  Location: [facility] C[facility] LAB;  Service: Cardiovascular;  Laterality: N/A;  . ATRIAL FIBRILLATION ABLATION     PVI by [name]  . CARDIAC CATHETERIZATION    . CARDIOVERSION N/A 09/04/2012   Procedure: CARDIOVERSION;  Surgeon: [name], MD;  Location: [facility] ENDOSCOPY;  Service: Cardiovascular;  Laterality: N/A;  . CARDIOVERSION N/A 02/11/2013   Procedure: CARDIOVERSION;  Surgeon: [name], MD;  Location: [facility] ENDOSCOPY;  Service: Cardiovascular;  Laterality: N/A;  . CARDIOVERSION N/A 12/07/2015   Procedure: CARDIOVERSION;  Surgeon: Chrystie Nose, MD;  Location: [facility] ENDOSCOPY;  Service: Cardiovascular;  Laterality: N/A;  . EYE SURGERY  2015,2016   bilateral cataract surgery with lens implants  . HERNIA REPAIR  03/2005  . JOINT REPLACEMENT  2005   total right hip replacement  . kidney stent  1987, 02/2005  . KIDNEY STONE SURGERY     2 stents placed  . KNEE ARTHROSCOPY WITH DRILLING/MICROFRACTURE Left 11/20/2013   Procedure: KNEE ARTHROSCOPY WITH DRILLING/MICROFRACTURE;  Surgeon: Jacki Cones, MD;  Location: [facility] ORS;  Service: Orthopedics;  Laterality: Left;  . KNEE ARTHROSCOPY WITH MEDIAL MENISECTOMY Left 11/20/2013   Procedure: LEFT KNEE ARTHROSCOPY WITH MEDIAL MENISECTOMY;  Surgeon: Jacki Cones, MD;  Location: [facility];  Service: Orthopedics;  Laterality: Left;  Microfracture of medial femoral chondyle and abrasion chondroplasty of the medial femoral chondyle  . LITHOTRIPSY     x 2  . PICC line  placement  2007  . quadrec  1999   LFT leg quad sx  . right hip surgery  2007   irrigation and debridement from bacteria  . TEE WITHOUT CARDIOVERSION N/A 12/02/2012   Procedure: TRANSESOPHAGEAL ECHOCARDIOGRAM (TEE);  Surgeon: [name], MD;  Location: [facility] ENDOSCOPY;  Service: Cardiovascular;  Laterality: N/A;  . TOTAL HIP ARTHROPLASTY Left 08/12/2015   Procedure: LEFT TOTAL HIP ARTHROPLASTY;  Surgeon: Ranee Gosselin, MD;  Location: [facility]S;  Service: Orthopedics;  Laterality: Left;    Social History   Socioeconomic History  . Marital status: Married    Spouse name: Not on file  . Number of children: Not on file  . Years of education: Not on file  . Highest education level: Not on file  Social Needs  . Financial resource strain: Not on file  . Food insecurity - worry: Not on file  . Food insecurity - inability: Not on file  . Transportation needs - medical: Not on file  . Transportation needs - non-medical: Not on file  Occupational History  . Occupation: Recruitment consultant: RETIRED  Tobacco Use  . Smoking status: Never Smoker  . Smokeless tobacco: Never Used  Substance and Sexual Activity  . Alcohol use: No  . Drug use: No  . Sexual activity: Not on file  Other Topics Concern  . Not on file  Social History Narrative   Lives in [place] with spouse.  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   [employer]:   Has worked extensively in [employer]. Significant asbestos asbestos exposure. No bird or mold exposure.     Family History  Problem Relation Age of Onset  . Cancer Mother 92       CANCER  . Heart disease Father 61       HEART PROBLEMS  . Lupus Brother        "of the skin"    ROS: no fevers or chills, productive cough, hemoptysis, dysphasia, odynophagia, melena, hematochezia, dysuria, hematuria, rash, seizure activity, orthopnea, PND, pedal edema, claudication. Remaining systems are  negative.  Physical Exam: Well-developed well-nourished in no acute distress.  Skin is warm and dry.  HEENT is normal.  Neck is supple.  Chest is clear to auscultation with normal expansion.  Cardiovascular exam is regular rate and rhythm.  Abdominal exam nontender or distended. No masses palpated. Extremities show no edema. neuro grossly intact  ECG- personally reviewed  A/P  1  Olga Millers, MD

## 2017-06-04 ENCOUNTER — Ambulatory Visit: Payer: Medicare Other | Admitting: Cardiology

## 2017-06-05 ENCOUNTER — Other Ambulatory Visit: Payer: Self-pay | Admitting: Cardiology

## 2017-07-02 ENCOUNTER — Telehealth: Payer: Self-pay | Admitting: Cardiology

## 2017-07-02 MED ORDER — METOPROLOL SUCCINATE ER 50 MG PO TB24: ORAL_TABLET | ORAL | 0 refills | Status: DC

## 2017-07-02 NOTE — Telephone Encounter (Signed)
New message     *STAT* If patient is at the pharmacy, call can be transferred to refill team.   1. Which medications need to be refilled? (please list name of each medication and dose if known) metoprolol succinate (TOPROL-XL) 50 MG 24 hr tablet  2. Which pharmacy/location (including street and city if local pharmacy) is medication to be sent to? [facility] on [address]   3. Do they need a 30 day or 90 day supply? 90

## 2017-07-04 DIAGNOSIS — H11421 Conjunctival edema, right eye: Secondary | ICD-10-CM | POA: Diagnosis not present

## 2017-07-04 DIAGNOSIS — H1131 Conjunctival hemorrhage, right eye: Secondary | ICD-10-CM | POA: Diagnosis not present

## 2017-07-04 DIAGNOSIS — H18891 Other specified disorders of cornea, right eye: Secondary | ICD-10-CM | POA: Diagnosis not present

## 2017-07-17 DIAGNOSIS — H1131 Conjunctival hemorrhage, right eye: Secondary | ICD-10-CM | POA: Diagnosis not present

## 2017-07-18 DIAGNOSIS — H1131 Conjunctival hemorrhage, right eye: Secondary | ICD-10-CM | POA: Diagnosis not present

## 2017-07-18 DIAGNOSIS — H1849 Other corneal degeneration: Secondary | ICD-10-CM | POA: Diagnosis not present

## 2017-07-18 DIAGNOSIS — H18891 Other specified disorders of cornea, right eye: Secondary | ICD-10-CM | POA: Diagnosis not present

## 2017-07-20 DIAGNOSIS — H18891 Other specified disorders of cornea, right eye: Secondary | ICD-10-CM | POA: Diagnosis not present

## 2017-07-20 DIAGNOSIS — H1131 Conjunctival hemorrhage, right eye: Secondary | ICD-10-CM | POA: Diagnosis not present

## 2017-07-20 DIAGNOSIS — H1849 Other corneal degeneration: Secondary | ICD-10-CM | POA: Diagnosis not present

## 2017-07-24 DIAGNOSIS — H1131 Conjunctival hemorrhage, right eye: Secondary | ICD-10-CM | POA: Diagnosis not present

## 2017-07-24 DIAGNOSIS — H1849 Other corneal degeneration: Secondary | ICD-10-CM | POA: Diagnosis not present

## 2017-07-24 DIAGNOSIS — H18891 Other specified disorders of cornea, right eye: Secondary | ICD-10-CM | POA: Diagnosis not present

## 2017-08-01 DIAGNOSIS — H1131 Conjunctival hemorrhage, right eye: Secondary | ICD-10-CM | POA: Diagnosis not present

## 2017-08-01 DIAGNOSIS — H18891 Other specified disorders of cornea, right eye: Secondary | ICD-10-CM | POA: Diagnosis not present

## 2017-08-01 DIAGNOSIS — H1849 Other corneal degeneration: Secondary | ICD-10-CM | POA: Diagnosis not present

## 2017-08-25 ENCOUNTER — Other Ambulatory Visit: Payer: Self-pay | Admitting: Cardiology

## 2017-08-25 DIAGNOSIS — E039 Hypothyroidism, unspecified: Secondary | ICD-10-CM

## 2017-08-27 NOTE — Telephone Encounter (Signed)
Should come from primary care Charles [name]  

## 2017-09-03 ENCOUNTER — Other Ambulatory Visit: Payer: Self-pay

## 2017-09-03 DIAGNOSIS — E039 Hypothyroidism, unspecified: Secondary | ICD-10-CM

## 2017-09-03 MED ORDER — LEVOTHYROXINE SODIUM 100 MCG PO TABS: ORAL_TABLET | ORAL | 1 refills | Status: DC

## 2017-09-27 NOTE — Progress Notes (Signed)
HPI: FU atrial fibrillation.  He has a past history of paroxysmal atrial fibrillation and tachycardia mediated cardiomyopathy. He underwent mapping and ablation of atrial fib in 11/2012 by Dr. [name]. He had early recurrence of atrial fib in 12/2012 requiring repeat DCCV. Dr. [name] restarted amiodarone 02/2013 to try and achieve and maintain NSR to allow the heart to remodel. In 09/2013 amiodarone was decreased and he was changed to [name] (edoxaban). Amiodarone was stopped in 01/2014 due to maintenance of NSR.   However, he was seen on 12/22/14 for recurrent atrial fibrillation. He was restarted on amiodarone. Subsequently converted back to normal sinus rhythm on amiodarone. The patient has developed some increased tremors of his hands from the amiodarone.Tremor has improved since we cut back on his amiodarone dose to just 100 mg daily. Last echocardiogram August 2015 showed normal LV systolic function, grade 2 diastolic dysfunction, mild mitral regurgitation and moderate left atrial enlargement. Carotid dopplers April 2017 normal. Had repeat cardioversion June 2017.  Since last seen,  patient denies dyspnea, chest pain, palpitations, syncope or bleeding.   Current Outpatient Medications  Medication Sig Dispense Refill  . acetaminophen (TYLENOL ARTHRITIS PAIN) 650 MG CR tablet Take 650 mg by mouth every 8 (eight) hours as needed for pain.     Marland Kitchen amiodarone (PACERONE) 200 MG tablet Take 1 tablet (200 mg total) by mouth daily. (Patient taking differently: Take 100 mg by mouth daily. Take 1/2 tab (100mg ) daily) 90 tablet 3  . Ascorbic Acid (VITAMIN C) 500 MG tablet Take 500-1,000 mg by mouth 2 (two) times daily. Pt takes two tablets in the morning and one tablet in the evening.    . Cholecalciferol (VITAMIN D) 2000 UNITS CAPS Take 4,000 Units by mouth every morning.     . Coenzyme Q10 (COQ10 PO) Take 100 mg by mouth every morning.     . edoxaban ([name]) 60 MG TABS tablet Take 30 mg by mouth  daily. 90 tablet 3  . enalapril (VASOTEC) 20 MG tablet TAKE ONE-HALF TABLET BY MOUTH ONCE DAILY IN THE EVENING 45 tablet 3  . furosemide (LASIX) 40 MG tablet TAKE ONE & ONE-HALF TABLETS BY MOUTH ONCE DAILY IN THE MORNING 135 tablet 3  . guaiFENesin (MUCINEX) 600 MG 12 hr tablet Take 600 mg by mouth 2 (two) times daily as needed for congestion. Reported on 12/14/2015    . levothyroxine (SYNTHROID, LEVOTHROID) 100 MCG tablet TAKE 1 TABLET BY MOUTH ONCE DAILY BEFORE BREAKFAST 90 tablet 1  . magnesium oxide (MAG-OX) 400 MG tablet Take 400 mg by mouth daily at 12 noon.    . metolazone (ZAROXOLYN) 5 MG tablet Take 1 tablet (5 mg total) by mouth daily as needed (fluid retention). 15 tablet 1  . metoprolol succinate (TOPROL-XL) 50 MG 24 hr tablet TAKE 1 TABLET BY MOUTH IN THE MORNING AND TAKE 1/2 TABLET IN THE EVENING 135 tablet 0  . Misc Natural Products (OSTEO BI-FLEX JOINT SHIELD PO) Take 2 tablets by mouth every morning.     . Multiple Vitamin (MULTIVITAMIN) tablet Take 1 tablet by mouth daily at 12 noon.     . Omega-3 Fatty Acids (FISH OIL) 1000 MG CAPS Take 1 capsule by mouth 2 (two) times daily.     Marland Kitchen omeprazole (PRILOSEC) 20 MG capsule Take 1 capsule (20 mg total) by mouth every other day. (Patient taking differently: Take 20 mg by mouth daily. ) 45 capsule 3  . polyvinyl alcohol (LIQUIFILM TEARS) 1.4 % ophthalmic solution  Place 1 drop into both eyes daily as needed for dry eyes.    Marland Kitchen zinc gluconate 50 MG tablet Take 50 mg by mouth every morning.      No current facility-administered medications for this visit.      Past Medical History:  Diagnosis Date  . Arthritis   . Atrial dilatation    mild biatrial dilitation  . Bruises easily   . CHF (congestive heart failure) (HCC)   . CKD (chronic kidney disease), stage III (HCC)   . Coronary artery disease   . Dysrhythmia    atrial fibrillation  . GERD (gastroesophageal reflux disease)   . H/O hiatal hernia   . Hypercholesterolemia   .  Hyperglycemia   . Hypertension   . Hypothyroidism   . Mitral regurgitation    a. Mild by echo in 01/2014.  Marland Kitchen Nonischemic cardiomyopathy (HCC)    a. presumed to be tachycardia mediated. Varying EFs over the years from 35-40% to normal and back.  . Paroxysmal atrial fibrillation (HCC)    a. H/o difficult to control. b. s/p PVI ablation 12-03-2012 by [name] with recurrence afterwards, requiring period of amiodarone.  . Peripheral edema   . Sleep apnea    USES C PAP  . Tear of medial meniscus of left knee   . Tremor     Past Surgical History:  Procedure Laterality Date  . ATRIAL FIBRILLATION ABLATION N/A 12/03/2012   Procedure: ATRIAL FIBRILLATION ABLATION;  Surgeon: Hillis Range, MD;  Location: [facility] CATH LAB;  Service: Cardiovascular;  Laterality: N/A;  . ATRIAL FIBRILLATION ABLATION     PVI by [name]  . CARDIAC CATHETERIZATION    . CARDIOVERSION N/A 09/04/2012   Procedure: CARDIOVERSION;  Surgeon: [name], MD;  Location: [facility] ENDOSCOPY;  Service: Cardiovascular;  Laterality: N/A;  . CARDIOVERSION N/A 02/11/2013   Procedure: CARDIOVERSION;  Surgeon: [name], MD;  Location: [facility] ENDOSCOPY;  Service: Cardiovascular;  Laterality: N/A;  . CARDIOVERSION N/A 12/07/2015   Procedure: CARDIOVERSION;  Surgeon: Chrystie Nose, MD;  Location: [facility] ENDOSCOPY;  Service: Cardiovascular;  Laterality: N/A;  . EYE SURGERY  2015,2016   bilateral cataract surgery with lens implants  . HERNIA REPAIR  03/2005  . JOINT REPLACEMENT  2005   total right hip replacement  . kidney stent  1987, 02/2005  . KIDNEY STONE SURGERY     2 stents placed  . KNEE ARTHROSCOPY WITH DRILLING/MICROFRACTURE Left 11/20/2013   Procedure: KNEE ARTHROSCOPY WITH DRILLING/MICROFRACTURE;  Surgeon: Jacki Cones, MD;  Location: [facility];  Service: Orthopedics;  Laterality: Left;  . KNEE ARTHROSCOPY WITH MEDIAL MENISECTOMY Left 11/20/2013   Procedure: LEFT KNEE ARTHROSCOPY WITH MEDIAL MENISECTOMY;  Surgeon: Jacki Cones, MD;  Location: [facility];  Service: Orthopedics;  Laterality: Left;  Microfracture of medial femoral chondyle and abrasion chondroplasty of the medial femoral chondyle  . LITHOTRIPSY     x 2  . PICC line placement  2007  . quadrec  1999   LFT leg quad sx  . right hip surgery  2007   irrigation and debridement from bacteria  . TEE WITHOUT CARDIOVERSION N/A 12/02/2012   Procedure: TRANSESOPHAGEAL ECHOCARDIOGRAM (TEE);  Surgeon: [name], MD;  Location: [facility] ENDOSCOPY;  Service: Cardiovascular;  Laterality: N/A;  . TOTAL HIP ARTHROPLASTY Left 08/12/2015   Procedure: LEFT TOTAL HIP ARTHROPLASTY;  Surgeon: Ranee Gosselin, MD;  Location: [facility];  Service: Orthopedics;  Laterality: Left;    Social History   Socioeconomic History  .  Marital status: Married    Spouse name: Not on file  . Number of children: Not on file  . Years of education: Not on file  . Highest education level: Not on file  Occupational History  . Occupation: Recruitment consultant: RETIRED  Social Needs  . Financial resource strain: Not on file  . Food insecurity:    Worry: Not on file    Inability: Not on file  . Transportation needs:    Medical: Not on file    Non-medical: Not on file  Tobacco Use  . Smoking status: Never Smoker  . Smokeless tobacco: Never Used  Substance and Sexual Activity  . Alcohol use: No  . Drug use: No  . Sexual activity: Not on file  Lifestyle  . Physical activity:    Days per week: Not on file    Minutes per session: Not on file  . Stress: Not on file  Relationships  . Social connections:    Talks on phone: Not on file    Gets together: Not on file    Attends religious service: Not on file    Active member of club or organization: Not on file    Attends meetings of clubs or organizations: Not on file    Relationship status: Not on file  . Intimate partner violence:    Fear of current or ex partner: Not on file    Emotionally abused: Not on file     Physically abused: Not on file    Forced sexual activity: Not on file  Other Topics Concern  . Not on file  Social History Narrative   Lives in [place] with spouse.  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   [employer]:   Has worked extensively in [employer]. Significant asbestos asbestos exposure. No bird or mold exposure.     Family History  Problem Relation Age of Onset  . Cancer Mother 27       CANCER  . Heart disease Father 56       HEART PROBLEMS  . Lupus Brother        "of the skin"    ROS: Tremor but no fevers or chills, productive cough, hemoptysis, dysphasia, odynophagia, melena, hematochezia, dysuria, hematuria, rash, seizure activity, orthopnea, PND, pedal edema, claudication. Remaining systems are negative.  Physical Exam: Well-developed well-nourished in no acute distress.  Skin is warm and dry.  HEENT is normal.  Neck is supple.  Chest is clear to auscultation with normal expansion.  Cardiovascular exam is regular rate and rhythm.  Abdominal exam nontender or distended. No masses palpated. Extremities show no edema. neuro grossly intact  ECG-sinus rhythm at a rate of 65.  Nonspecific ST changes.  Personally reviewed  A/P  1 paroxysmal atrial fibrillation-patient remains in sinus rhythm today.  His tremor has worsened.  He feels it may be related to amiodarone.  However there is a family history of tremor and I am therefore not optimistic DCing amiodarone will improve his symptoms. We will discontinue amiodarone for now to see if tremor improves.  He understands the risk of atrial fibrillation is higher off of this medication.  If atrial fibrillation recurs we can consider a different antiarrhythmic.  We will follow his tremor off of amiodarone.  Continue edoxaban.  Check hemoglobin and renal function.  2 hypertension-blood pressure is controlled.  Continue present medications.  Check potassium and renal  function.  3 chronic stage III kidney disease-check BMET.  Olga Millers, MD

## 2017-10-02 ENCOUNTER — Ambulatory Visit: Payer: Medicare Other | Admitting: Cardiology

## 2017-10-02 ENCOUNTER — Encounter: Payer: Self-pay | Admitting: Cardiology

## 2017-10-02 ENCOUNTER — Other Ambulatory Visit: Payer: Self-pay | Admitting: *Deleted

## 2017-10-02 VITALS — BP 126/62 | HR 65 | Ht 70.0 in | Wt 199.8 lb

## 2017-10-02 DIAGNOSIS — I10 Essential (primary) hypertension: Secondary | ICD-10-CM

## 2017-10-02 DIAGNOSIS — I4891 Unspecified atrial fibrillation: Secondary | ICD-10-CM | POA: Diagnosis not present

## 2017-10-02 DIAGNOSIS — N183 Chronic kidney disease, stage 3 unspecified: Secondary | ICD-10-CM

## 2017-10-02 LAB — CBC
Hematocrit: 35.1 % — ABNORMAL LOW (ref 37.5–51.0)
Hemoglobin: 12.3 g/dL — ABNORMAL LOW (ref 13.0–17.7)
MCH: 31.2 pg (ref 26.6–33.0)
MCHC: 35 g/dL (ref 31.5–35.7)
MCV: 89 fL (ref 79–97)
PLATELETS: 234 10*3/uL (ref 150–379)
RBC: 3.94 x10E6/uL — ABNORMAL LOW (ref 4.14–5.80)
RDW: 12.7 % (ref 12.3–15.4)
WBC: 9.9 10*3/uL (ref 3.4–10.8)

## 2017-10-02 LAB — BASIC METABOLIC PANEL
BUN / CREAT RATIO: 17 (ref 10–24)
BUN: 30 mg/dL — ABNORMAL HIGH (ref 8–27)
CHLORIDE: 96 mmol/L (ref 96–106)
CO2: 26 mmol/L (ref 20–29)
Calcium: 10.1 mg/dL (ref 8.6–10.2)
Creatinine, Ser: 1.77 mg/dL — ABNORMAL HIGH (ref 0.76–1.27)
GFR calc non Af Amer: 36 mL/min/{1.73_m2} — ABNORMAL LOW (ref >59)
GFR, EST AFRICAN AMERICAN: 42 mL/min/{1.73_m2} — AB (ref >59)
GLUCOSE: 107 mg/dL — AB (ref 65–99)
Potassium: 5.1 mmol/L (ref 3.5–5.2)
SODIUM: 139 mmol/L (ref 134–144)

## 2017-10-02 MED ORDER — METOPROLOL SUCCINATE ER 50 MG PO TB24: ORAL_TABLET | ORAL | 3 refills | Status: DC

## 2017-10-02 MED ORDER — ENALAPRIL MALEATE 20 MG PO TABS: ORAL_TABLET | ORAL | 3 refills | Status: DC

## 2017-10-02 MED ORDER — FUROSEMIDE 40 MG PO TABS: ORAL_TABLET | ORAL | 3 refills | Status: DC

## 2017-10-02 NOTE — Patient Instructions (Signed)
Medication Instructions:   STOP AMIODARONE  Labwork:  Your physician recommends that you HAVE LAB WORK TODAY   Follow-Up:  Your physician wants you to follow-up in: 6 MONTHS WITH DR CRENSHAW You will receive a reminder letter in the mail two months in advance. If you don't receive a letter, please call our office to schedule the follow-up appointment.   If you need a refill on your cardiac medications before your next appointment, please call your pharmacy.    

## 2017-10-25 DIAGNOSIS — H26491 Other secondary cataract, right eye: Secondary | ICD-10-CM | POA: Diagnosis not present

## 2017-10-25 DIAGNOSIS — H35033 Hypertensive retinopathy, bilateral: Secondary | ICD-10-CM | POA: Diagnosis not present

## 2017-10-25 DIAGNOSIS — H26492 Other secondary cataract, left eye: Secondary | ICD-10-CM | POA: Diagnosis not present

## 2017-10-25 DIAGNOSIS — H35371 Puckering of macula, right eye: Secondary | ICD-10-CM | POA: Diagnosis not present

## 2018-01-17 ENCOUNTER — Other Ambulatory Visit: Payer: Self-pay | Admitting: Cardiology

## 2018-01-17 DIAGNOSIS — I4891 Unspecified atrial fibrillation: Secondary | ICD-10-CM

## 2018-01-18 ENCOUNTER — Telehealth: Payer: Self-pay | Admitting: Cardiology

## 2018-01-18 NOTE — Telephone Encounter (Signed)
Rx request sent to pharmacy.  

## 2018-01-18 NOTE — Telephone Encounter (Signed)
Follow up   Pt returning call for Debra. Please call

## 2018-01-18 NOTE — Telephone Encounter (Signed)
Unable to reach pt or leave a message  

## 2018-01-18 NOTE — Telephone Encounter (Signed)
New Message       Patient states he is in A-fib. Pls advise.

## 2018-01-18 NOTE — Telephone Encounter (Signed)
Resume amiodarone 200 BID for 1 week then 200 mg daily; PAOV if atrial fibrillation persists after above measures  Olga Millers

## 2018-01-18 NOTE — Telephone Encounter (Signed)
Patient notified of MD recommendations. Agrees with plan. He has amiodarone from previous Rx. Med list updated.

## 2018-01-18 NOTE — Telephone Encounter (Signed)
Returned call to patient of Dr. Jens Som who states he is in AFib. He has known PAF. He states he monitors his pulse via a wrist and arm device. On Wednesday evening, he noticed his heart was out of rhythm and HR was 130bpm. Yesterday his HR was "all over the place" and this AM his HR was 68bpm (normal is mid 50s-60s). He denies SOB, CP, lightheadedness, dizziness. He states he only feels short of breath when he is out of rhythm for extended periods of time. His BP averages the 110s/50s-60s.  Patient is taking savasya as prescribed. He is questioning if he should resume amiodarone - was stopped at last visit d/t tremors. He has taking a "loading dose" of amiodarone in the past and he tapered off of it and this fixed his AFib problem - this was done with Dr. [name] per patient.   Will route to MD for advice

## 2018-02-23 NOTE — Progress Notes (Signed)
Cardiology Office Note   Date:  02/26/2018   ID:  Charles [name], DOB 11/14/1940, MRN [id number]  PCP:  Dellinger, Robert C. Jr., MD  Cardiologist: Dr. Crenshaw Chief Complaint  Patient presents with  . Atrial Fibrillation     History of Present Illness: Charles [name] is a 77 y.o. male who presents for ongoing assessment and management of hypertension, paroxysmal atrial fibrillation, tachycardia mediated cardiomyopathy.  The patient has been seen by Dr. Allred, electrophysiologist and on 11/2012 had mapping and ablation of atrial fib.  The patient was placed on amiodarone at that time but subsequently taken off of this in 2015 as he remained in normal sinus rhythm.    The patient had recurrent atrial fibrillation in June 2016 and was restarted on amiodarone, and did convert back to normal sinus rhythm.  It was noted that he had increased tremors on high doses of amiodarone, and therefore his dose of amiodarone was at 100 mg daily.He had repeat cardioversion in June 2017.    He was last seen by Dr. Crenshaw on 10/02/2017.  At that time he had no complaints of dyspnea chest pain palpitations syncope or bleeding.  However, it was noted that the patient's tremor had worsened.  Amiodarone was discontinued to see if tremor improved.  He understood the risk of recurrent atrial fibrillation.  He was to continue edoxaban for anticoagulation.  Unfortunately, on 01/18/2018 the patient called our office stating that he was out of rhythm and therefore was restarted back on amiodarone, 200 mg twice daily for 1 week then 200 mg daily.  He states that he is feeling tired and worn out. He feels better when he is NSR. He has responded to DCCV in the past, but with amiodarone manipulation, he has gone back into Afib. He would like to be considered for DCCV again.    Past Medical History:  Diagnosis Date  . Arthritis   . Atrial dilatation    mild biatrial dilitation  . Bruises easily   . CHF (congestive heart  failure) (HCC)   . CKD (chronic kidney disease), stage III (HCC)   . Coronary artery disease   . Dysrhythmia    atrial fibrillation  . GERD (gastroesophageal reflux disease)   . H/O hiatal hernia   . Hypercholesterolemia   . Hyperglycemia   . Hypertension   . Hypothyroidism   . Mitral regurgitation    a. Mild by echo in 01/2014.  . Nonischemic cardiomyopathy (HCC)    a. presumed to be tachycardia mediated. Varying EFs over the years from 35-40% to normal and back.  . Paroxysmal atrial fibrillation (HCC)    a. H/o difficult to control. b. s/p PVI ablation 12-03-2012 by Dr Allred with recurrence afterwards, requiring period of amiodarone.  . Peripheral edema   . Sleep apnea    USES C PAP  . Tear of medial meniscus of left knee   . Tremor     Past Surgical History:  Procedure Laterality Date  . ATRIAL FIBRILLATION ABLATION N/A 12/03/2012   Procedure: ATRIAL FIBRILLATION ABLATION;  Surgeon: James Allred, MD;  Location: [facility];  Service: Cardiovascular;  Laterality: N/A;  . ATRIAL FIBRILLATION ABLATION     PVI by Dr Allred  . CARDIAC CATHETERIZATION    . CARDIOVERSION N/A 09/04/2012   Procedure: CARDIOVERSION;  Surgeon: Thomas Brackbill, MD;  Location: [facility]OS[facility];  Service: Cardiovascular;  Laterality: N/A;  . CARDIOVERSION N/A 02/11/2013   Procedure: CARDIOVERSION;  Surgeon: Thomas Brackbill, MD;    Location: [facility];  Service: Cardiovascular;  Laterality: N/A;  . CARDIOVERSION N/A 12/07/2015   Procedure: CARDIOVERSION;  Surgeon: Kenneth C Hilty, MD;  Location: [facility];  Service: Cardiovascular;  Laterality: N/A;  . EYE SURGERY  2015,2016   bilateral cataract surgery with lens implants  . HERNIA REPAIR  03/2005  . JOINT REPLACEMENT  2005   total right hip replacement  . kidney stent  1987, 02/2005  . KIDNEY STONE SURGERY     2 stents placed  . KNEE ARTHROSCOPY WITH DRILLING/MICROFRACTURE Left 11/20/2013   Procedure: KNEE ARTHROSCOPY WITH DRILLING/MICROFRACTURE;   Surgeon: Ronald A Gioffre, MD;  Location: [facility];  Service: Orthopedics;  Laterality: Left;  . KNEE ARTHROSCOPY WITH MEDIAL MENISECTOMY Left 11/20/2013   Procedure: LEFT KNEE ARTHROSCOPY WITH MEDIAL MENISECTOMY;  Surgeon: Ronald A Gioffre, MD;  Location: [facility];  Service: Orthopedics;  Laterality: Left;  Microfracture of medial femoral chondyle and abrasion chondroplasty of the medial femoral chondyle  . LITHOTRIPSY     x 2  . PICC line placement  2007  . quadrec  1999   LFT leg quad sx  . right hip surgery  2007   irrigation and debridement from bacteria  . TEE WITHOUT CARDIOVERSION N/A 12/02/2012   Procedure: TRANSESOPHAGEAL ECHOCARDIOGRAM (TEE);  Surgeon: Paula V Ross, MD;  Location: [facility];  Service: Cardiovascular;  Laterality: N/A;  . TOTAL HIP ARTHROPLASTY Left 08/12/2015   Procedure: LEFT TOTAL HIP ARTHROPLASTY;  Surgeon: Ronald Gioffre, MD;  Location: [facility];  Service: Orthopedics;  Laterality: Left;     Current Outpatient Medications  Medication Sig Dispense Refill  . acetaminophen (TYLENOL ARTHRITIS PAIN) 650 MG CR tablet Take 650 mg by mouth every 8 (eight) hours as needed for pain.     . amiodarone (PACERONE) 200 MG tablet Take 200 mg by mouth daily.     . Ascorbic Acid (VITAMIN C) 500 MG tablet Take 500-1,000 mg by mouth 2 (two) times daily. Pt takes two tablets in the morning and one tablet in the evening.    . Cholecalciferol (VITAMIN D) 2000 UNITS CAPS Take 4,000 Units by mouth every morning.     . Coenzyme Q10 (COQ10 PO) Take 100 mg by mouth every morning.     . edoxaban ([name]) 60 MG TABS tablet Take 30 mg by mouth daily. 90 tablet 3  . enalapril (VASOTEC) 20 MG tablet TAKE ONE-HALF TABLET BY MOUTH ONCE DAILY IN THE EVENING 135 tablet 3  . furosemide (LASIX) 40 MG tablet TAKE ONE & ONE-HALF TABLETS BY MOUTH ONCE DAILY IN THE MORNING 135 tablet 3  . guaiFENesin (MUCINEX) 600 MG 12 hr tablet Take 600 mg by mouth 2 (two) times daily as needed for congestion. Reported on  12/14/2015    . IRON PO Take 1 tablet by mouth daily. At noon    . levothyroxine (SYNTHROID, LEVOTHROID) 100 MCG tablet TAKE 1 TABLET BY MOUTH ONCE DAILY BEFORE BREAKFAST 90 tablet 1  . magnesium oxide (MAG-OX) 400 MG tablet Take 400 mg by mouth daily at 12 noon.    . metolazone (ZAROXOLYN) 5 MG tablet Take 1 tablet (5 mg total) by mouth daily as needed (fluid retention). 15 tablet 1  . metoprolol succinate (TOPROL-XL) 50 MG 24 hr tablet TAKE 1 TABLET BY MOUTH IN THE MORNING AND TAKE 1/2 TABLET IN THE EVENING 135 tablet 3  . Misc Natural Products (OSTEO BI-FLEX JOINT SHIELD PO) Take 2 tablets by mouth every morning.     . Multiple Vitamin (  MULTIVITAMIN) tablet Take 1 tablet by mouth daily at 12 noon.     . Omega-3 Fatty Acids (FISH OIL) 1000 MG CAPS Take 1 capsule by mouth 2 (two) times daily.     . omeprazole (PRILOSEC) 20 MG capsule Take 20 mg by mouth daily.    . polyvinyl alcohol (LIQUIFILM TEARS) 1.4 % ophthalmic solution Place 1 drop into both eyes daily as needed for dry eyes.    . zinc gluconate 50 MG tablet Take 50 mg by mouth daily. At noon     No current facility-administered medications for this visit.     Allergies:   Ceftriaxone sodium; Clindamycin/lincomycin; Diltiazem hcl; and Penicillins    Social History:  The patient  reports that he has never smoked. He has never used smokeless tobacco. He reports that he does not drink alcohol or use drugs.   Family History:  The patient's family history includes Cancer (age of onset: 83) in his mother; Heart disease (age of onset: 85) in his father; Lupus in his brother.    ROS: All other systems are reviewed and negative. Unless otherwise mentioned in H&P    PHYSICAL EXAM: VS:  BP 98/64 (BP Location: Right Arm, Patient Position: Sitting, Cuff Size: Normal)   Pulse (!) 110   Ht 5' 10" (1.778 m)   Wt 190 lb 12.8 oz (86.5 kg)   BMI 27.38 kg/m  , BMI Body mass index is 27.38 kg/m. GEN: Well nourished, well developed, in no acute  distress  HEENT: normal  Neck: no JVD, carotid bruits, or masses Cardiac: IRRR; no murmurs, rubs, or gallops,no edema  Respiratory:  clear to auscultation bilaterally, normal work of breathing GI: soft, nontender, nondistended, + BS MS: no deformity or atrophy  Skin: warm and dry, no rash Neuro:  Strength and sensation are intact Psych: euthymic mood, full affect   EKG:  Atrial fib with RVR, rate of 110 bpm. Non-specific T-wave abnormality.   Recent Labs: 10/02/2017: BUN 30; Creatinine, Ser 1.77; Hemoglobin 12.3; Platelets 234; Potassium 5.1; Sodium 139    Lipid Panel    Component Value Date/Time   CHOL 180 05/05/2015 1023   TRIG 248 (H) 05/05/2015 1023   HDL 33 (L) 05/05/2015 1023   CHOLHDL 5.5 (H) 05/05/2015 1023   VLDL 50 (H) 05/05/2015 1023   LDLCALC 97 05/05/2015 1023   LDLDIRECT 95.3 10/24/2011 1022      Wt Readings from Last 3 Encounters:  02/26/18 190 lb 12.8 oz (86.5 kg)  10/02/17 199 lb 12.8 oz (90.6 kg)  11/06/16 194 lb (88 kg)      Other studies Reviewed: Echocardiogram 02/18/2014 Left ventricle: The cavity size was normal. Systolic function was normal. The estimated ejection fraction was in the range of 55% to 60%. Wall motion was normal; there were no regional wall motion abnormalities. Features are consistent with a pseudonormal left ventricular filling pattern, with concomitant abnormal relaxation and increased filling pressure (grade 2 diastolic dysfunction). Doppler parameters are consistent with high ventricular filling pressure. - Aortic valve: Trileaflet; normal thickness leaflets. There was no regurgitation. - Aortic root: The aortic root was normal in size. - Mitral valve: There was mild regurgitation. - Left atrium: The atrium was moderately dilated. - Right ventricle: Systolic function was normal. - Right atrium: The atrium was normal in size. - Pulmonary arteries: Systolic pressure was mildly increased. PA peak pressure:  38 mm Hg (S).  Impressions:  - Normal biventricular size and function. Mild mitral regurgitation. Moderately dilated left atrium.   Mild pulmonary hypertension.  ASSESSMENT AND PLAN:  1.  PAF: The patient has responded to DCCV in the past. He has been on and off of amiodarone and had doses reduced due to tremors. He has been off of amiodarone until being restarted on 01/19/2018. He was loaded with 200 mg BID for one week and then decreased to 200 mg daily. He remains in atrial fib with uncontrolled rate, and is symptomatic with fatigue.   I have discussed this with Dr. Harding, DOD on site, and requested that patient be scheduled for a DCCV and possibly increase dose of amiodarone to 200 mg.  He has reviewed his records. He is in agreement with my plan.   The patient will be scheduled with Dr. Crenshaw (the patients request) on Monday, September 16 th at 9:45 am. Instructions are given to the patient verbally and in writing. He is to continue [name] and metoprolol as well.      Current medicines are reviewed at length with the patient today.  I have spent over 45 minutes with this patient and his wife arranging DCCV to fit his schedule and schedule with Dr. Crenshaw in the hospital.   Labs/ tests ordered today include: Pre-DCCV labs.  Kathryn M. Lawrence DNP, ANP, AACC   02/26/2018 12:26 PM    [facility] [address], , [place] [address] Phone: [phone]; Fax: [phone] 

## 2018-02-23 NOTE — H&P (View-Only) (Signed)
Cardiology Office Note   Date:  02/26/2018   ID:  Charles [name], DOB 22-Dec-1940, MRN [id number]  PCP:  Lelon Huh Romelle Starcher., MD  Cardiologist: Dr. Jens Som Chief Complaint  Patient presents with  . Atrial Fibrillation     History of Present Illness: Charles [name] is a 77 y.o. male who presents for ongoing assessment and management of hypertension, paroxysmal atrial fibrillation, tachycardia mediated cardiomyopathy.  The patient has been seen by Dr. [name], electrophysiologist and on 11/2012 had mapping and ablation of atrial fib.  The patient was placed on amiodarone at that time but subsequently taken off of this in 2015 as he remained in normal sinus rhythm.    The patient had recurrent atrial fibrillation in June 2016 and was restarted on amiodarone, and did convert back to normal sinus rhythm.  It was noted that he had increased tremors on high doses of amiodarone, and therefore his dose of amiodarone was at 100 mg daily.He had repeat cardioversion in June 2017.    He was last seen by Dr. Jens Som on 10/02/2017.  At that time he had no complaints of dyspnea chest pain palpitations syncope or bleeding.  However, it was noted that the patient's tremor had worsened.  Amiodarone was discontinued to see if tremor improved.  He understood the risk of recurrent atrial fibrillation.  He was to continue edoxaban for anticoagulation.  Unfortunately, on 01/18/2018 the patient called our office stating that he was out of rhythm and therefore was restarted back on amiodarone, 200 mg twice daily for 1 week then 200 mg daily.  He states that he is feeling tired and worn out. He feels better when he is NSR. He has responded to DCCV in the past, but with amiodarone manipulation, he has gone back into Afib. He would like to be considered for DCCV again.    Past Medical History:  Diagnosis Date  . Arthritis   . Atrial dilatation    mild biatrial dilitation  . Bruises easily   . CHF (congestive heart  failure) (HCC)   . CKD (chronic kidney disease), stage III (HCC)   . Coronary artery disease   . Dysrhythmia    atrial fibrillation  . GERD (gastroesophageal reflux disease)   . H/O hiatal hernia   . Hypercholesterolemia   . Hyperglycemia   . Hypertension   . Hypothyroidism   . Mitral regurgitation    a. Mild by echo in 01/2014.  Marland Kitchen Nonischemic cardiomyopathy (HCC)    a. presumed to be tachycardia mediated. Varying EFs over the years from 35-40% to normal and back.  . Paroxysmal atrial fibrillation (HCC)    a. H/o difficult to control. b. s/p PVI ablation 12-03-2012 by [name] with recurrence afterwards, requiring period of amiodarone.  . Peripheral edema   . Sleep apnea    USES C PAP  . Tear of medial meniscus of left knee   . Tremor     Past Surgical History:  Procedure Laterality Date  . ATRIAL FIBRILLATION ABLATION N/A 12/03/2012   Procedure: ATRIAL FIBRILLATION ABLATION;  Surgeon: Hillis Range, MD;  Location: [facility] CATH LAB;  Service: Cardiovascular;  Laterality: N/A;  . ATRIAL FIBRILLATION ABLATION     PVI by [name]  . CARDIAC CATHETERIZATION    . CARDIOVERSION N/A 09/04/2012   Procedure: CARDIOVERSION;  Surgeon: [name], MD;  Location: [facility] ENDOSCOPY;  Service: Cardiovascular;  Laterality: N/A;  . CARDIOVERSION N/A 02/11/2013   Procedure: CARDIOVERSION;  Surgeon: [name], MD;  Location: [facility] ENDOSCOPY;  Service: Cardiovascular;  Laterality: N/A;  . CARDIOVERSION N/A 12/07/2015   Procedure: CARDIOVERSION;  Surgeon: Chrystie Nose, MD;  Location: [facility] ENDOSCOPY;  Service: Cardiovascular;  Laterality: N/A;  . EYE SURGERY  2015,2016   bilateral cataract surgery with lens implants  . HERNIA REPAIR  03/2005  . JOINT REPLACEMENT  2005   total right hip replacement  . kidney stent  1987, 02/2005  . KIDNEY STONE SURGERY     2 stents placed  . KNEE ARTHROSCOPY WITH DRILLING/MICROFRACTURE Left 11/20/2013   Procedure: KNEE ARTHROSCOPY WITH DRILLING/MICROFRACTURE;   Surgeon: Jacki Cones, MD;  Location: [facility] ORS;  Service: Orthopedics;  Laterality: Left;  . KNEE ARTHROSCOPY WITH MEDIAL MENISECTOMY Left 11/20/2013   Procedure: LEFT KNEE ARTHROSCOPY WITH MEDIAL MENISECTOMY;  Surgeon: Jacki Cones, MD;  Location: [facility] ORS;  Service: Orthopedics;  Laterality: Left;  Microfracture of medial femoral chondyle and abrasion chondroplasty of the medial femoral chondyle  . LITHOTRIPSY     x 2  . PICC line placement  2007  . quadrec  1999   LFT leg quad sx  . right hip surgery  2007   irrigation and debridement from bacteria  . TEE WITHOUT CARDIOVERSION N/A 12/02/2012   Procedure: TRANSESOPHAGEAL ECHOCARDIOGRAM (TEE);  Surgeon: [name], MD;  Location: [facility] ENDOSCOPY;  Service: Cardiovascular;  Laterality: N/A;  . TOTAL HIP ARTHROPLASTY Left 08/12/2015   Procedure: LEFT TOTAL HIP ARTHROPLASTY;  Surgeon: Ranee Gosselin, MD;  Location: [facility] ORS;  Service: Orthopedics;  Laterality: Left;     Current Outpatient Medications  Medication Sig Dispense Refill  . acetaminophen (TYLENOL ARTHRITIS PAIN) 650 MG CR tablet Take 650 mg by mouth every 8 (eight) hours as needed for pain.     Marland Kitchen amiodarone (PACERONE) 200 MG tablet Take 200 mg by mouth daily.     . Ascorbic Acid (VITAMIN C) 500 MG tablet Take 500-1,000 mg by mouth 2 (two) times daily. Pt takes two tablets in the morning and one tablet in the evening.    . Cholecalciferol (VITAMIN D) 2000 UNITS CAPS Take 4,000 Units by mouth every morning.     . Coenzyme Q10 (COQ10 PO) Take 100 mg by mouth every morning.     . edoxaban ([name]) 60 MG TABS tablet Take 30 mg by mouth daily. 90 tablet 3  . enalapril (VASOTEC) 20 MG tablet TAKE ONE-HALF TABLET BY MOUTH ONCE DAILY IN THE EVENING 135 tablet 3  . furosemide (LASIX) 40 MG tablet TAKE ONE & ONE-HALF TABLETS BY MOUTH ONCE DAILY IN THE MORNING 135 tablet 3  . guaiFENesin (MUCINEX) 600 MG 12 hr tablet Take 600 mg by mouth 2 (two) times daily as needed for congestion. Reported on  12/14/2015    . IRON PO Take 1 tablet by mouth daily. At noon    . levothyroxine (SYNTHROID, LEVOTHROID) 100 MCG tablet TAKE 1 TABLET BY MOUTH ONCE DAILY BEFORE BREAKFAST 90 tablet 1  . magnesium oxide (MAG-OX) 400 MG tablet Take 400 mg by mouth daily at 12 noon.    . metolazone (ZAROXOLYN) 5 MG tablet Take 1 tablet (5 mg total) by mouth daily as needed (fluid retention). 15 tablet 1  . metoprolol succinate (TOPROL-XL) 50 MG 24 hr tablet TAKE 1 TABLET BY MOUTH IN THE MORNING AND TAKE 1/2 TABLET IN THE EVENING 135 tablet 3  . Misc Natural Products (OSTEO BI-FLEX JOINT SHIELD PO) Take 2 tablets by mouth every morning.     . Multiple Vitamin (  MULTIVITAMIN) tablet Take 1 tablet by mouth daily at 12 noon.     . Omega-3 Fatty Acids (FISH OIL) 1000 MG CAPS Take 1 capsule by mouth 2 (two) times daily.     Marland Kitchen omeprazole (PRILOSEC) 20 MG capsule Take 20 mg by mouth daily.    . polyvinyl alcohol (LIQUIFILM TEARS) 1.4 % ophthalmic solution Place 1 drop into both eyes daily as needed for dry eyes.    Marland Kitchen zinc gluconate 50 MG tablet Take 50 mg by mouth daily. At noon     No current facility-administered medications for this visit.     Allergies:   Ceftriaxone sodium; Clindamycin/lincomycin; Diltiazem hcl; and Penicillins    Social History:  The patient  reports that he has never smoked. He has never used smokeless tobacco. He reports that he does not drink alcohol or use drugs.   Family History:  The patient's family history includes Cancer (age of onset: 51) in his mother; Heart disease (age of onset: 37) in his father; Lupus in his brother.    ROS: All other systems are reviewed and negative. Unless otherwise mentioned in H&P    PHYSICAL EXAM: VS:  BP 98/64 (BP Location: Right Arm, Patient Position: Sitting, Cuff Size: Normal)   Pulse (!) 110   Ht 5\' 10"  (1.778 m)   Wt 190 lb 12.8 oz (86.5 kg)   BMI 27.38 kg/m  , BMI Body mass index is 27.38 kg/m. GEN: Well nourished, well developed, in no acute  distress  HEENT: normal  Neck: no JVD, carotid bruits, or masses Cardiac: IRRR; no murmurs, rubs, or gallops,no edema  Respiratory:  clear to auscultation bilaterally, normal work of breathing GI: soft, nontender, nondistended, + BS MS: no deformity or atrophy  Skin: warm and dry, no rash Neuro:  Strength and sensation are intact Psych: euthymic mood, full affect   EKG:  Atrial fib with RVR, rate of 110 bpm. Non-specific T-wave abnormality.   Recent Labs: 10/02/2017: BUN 30; Creatinine, Ser 1.77; Hemoglobin 12.3; Platelets 234; Potassium 5.1; Sodium 139    Lipid Panel    Component Value Date/Time   CHOL 180 05/05/2015 1023   TRIG 248 (H) 05/05/2015 1023   HDL 33 (L) 05/05/2015 1023   CHOLHDL 5.5 (H) 05/05/2015 1023   VLDL 50 (H) 05/05/2015 1023   LDLCALC 97 05/05/2015 1023   LDLDIRECT 95.3 10/24/2011 1022      Wt Readings from Last 3 Encounters:  02/26/18 190 lb 12.8 oz (86.5 kg)  10/02/17 199 lb 12.8 oz (90.6 kg)  11/06/16 194 lb (88 kg)      Other studies Reviewed: Echocardiogram 2014-03-09 Left ventricle: The cavity size was normal. Systolic function was normal. The estimated ejection fraction was in the range of 55% to 60%. Wall motion was normal; there were no regional wall motion abnormalities. Features are consistent with a pseudonormal left ventricular filling pattern, with concomitant abnormal relaxation and increased filling pressure (grade 2 diastolic dysfunction). Doppler parameters are consistent with high ventricular filling pressure. - Aortic valve: Trileaflet; normal thickness leaflets. There was no regurgitation. - Aortic root: The aortic root was normal in size. - Mitral valve: There was mild regurgitation. - Left atrium: The atrium was moderately dilated. - Right ventricle: Systolic function was normal. - Right atrium: The atrium was normal in size. - Pulmonary arteries: Systolic pressure was mildly increased. PA peak pressure:  38 mm Hg (S).  Impressions:  - Normal biventricular size and function. Mild mitral regurgitation. Moderately dilated left atrium.  Mild pulmonary hypertension.  ASSESSMENT AND PLAN:  1.  PAF: The patient has responded to DCCV in the past. He has been on and off of amiodarone and had doses reduced due to tremors. He has been off of amiodarone until being restarted on 01/19/2018. He was loaded with 200 mg BID for one week and then decreased to 200 mg daily. He remains in atrial fib with uncontrolled rate, and is symptomatic with fatigue.   I have discussed this with Dr. Herbie Baltimore, DOD on site, and requested that patient be scheduled for a DCCV and possibly increase dose of amiodarone to 200 mg.  He has reviewed his records. He is in agreement with my plan.   The patient will be scheduled with Dr. Jens Som (the patients request) on Monday, September 16 th at 9:45 am. Instructions are given to the patient verbally and in writing. He is to continue [name] and metoprolol as well.      Current medicines are reviewed at length with the patient today.  I have spent over 45 minutes with this patient and his wife arranging DCCV to fit his schedule and schedule with Dr. Jens Som in the hospital.   Labs/ tests ordered today include: Pre-DCCV labs.  Bettey Mare. Liborio Nixon, ANP, AACC   02/26/2018 12:26 PM     [facility] [address], [place], [place] [address] Phone: [phone]; Fax: [phone]

## 2018-02-26 ENCOUNTER — Encounter: Payer: Self-pay | Admitting: Adult Health

## 2018-02-26 ENCOUNTER — Ambulatory Visit: Payer: Medicare Other | Admitting: Adult Health

## 2018-02-26 VITALS — BP 98/64 | HR 110 | Ht 70.0 in | Wt 190.8 lb

## 2018-02-26 DIAGNOSIS — I4819 Other persistent atrial fibrillation: Secondary | ICD-10-CM

## 2018-02-26 DIAGNOSIS — Z79899 Other long term (current) drug therapy: Secondary | ICD-10-CM | POA: Diagnosis not present

## 2018-02-26 DIAGNOSIS — I481 Persistent atrial fibrillation: Secondary | ICD-10-CM

## 2018-02-26 NOTE — Patient Instructions (Addendum)
Dear MR Charles [name] (patient name) You are scheduled for a Cardioversion on 03-11-2018 with Dr. Jens Som.  Please arrive at the [facility] (Main Entrance A) at [facility]: [address] [place], [place] [address] at 8AM. (1 hour prior to procedure unless lab work is needed; if lab work is needed arrive 1.5 hours ahead)  DIET: Nothing to eat or drink after midnight except a sip of water with medications (see medication instructions below)  Medication Instructions: Hold LASIX, METOLAZONE   Labs: Come [id number] OFFICE THE WEEK OF 03-04-2018 FOR LABS  You must have a responsible person to drive you home and stay in the waiting area during your procedure. Failure to do so could result in cancellation.  Bring your insurance cards.  *Special Note: Every effort is made to have your procedure done on time. Occasionally there are emergencies that occur at the hospital that may cause delays. Please be patient if a delay does occur.   Follow-Up: Your physician wants you to follow-up in: KEEP SCHEDULED FOLLOW UP WITH DR CRENSHAW.   Thank you for choosing [facility] at [facility]!!

## 2018-02-28 ENCOUNTER — Other Ambulatory Visit: Payer: Self-pay

## 2018-02-28 MED ORDER — OMEPRAZOLE 40 MG PO CPDR: 40.0000 mg | DELAYED_RELEASE_CAPSULE | Freq: Every day | ORAL | 1 refills | Status: DC

## 2018-02-28 MED ORDER — AMIODARONE HCL 200 MG PO TABS: 200.0000 mg | ORAL_TABLET | Freq: Two times a day (BID) | ORAL | 1 refills | Status: DC

## 2018-02-28 NOTE — Telephone Encounter (Signed)
Per Joni Reining, NP Pt needs Rx for Amiodarone 200mg  bid and Omeprazole 40mg  daily sent to his pharmacy [facility] on [address].  Rx sent for  Amio 200mg  bid #180 R-1 Omeprazole 40mg  #90 R-1

## 2018-03-05 ENCOUNTER — Other Ambulatory Visit: Payer: Medicare Other

## 2018-03-05 DIAGNOSIS — I481 Persistent atrial fibrillation: Secondary | ICD-10-CM | POA: Diagnosis not present

## 2018-03-05 DIAGNOSIS — Z79899 Other long term (current) drug therapy: Secondary | ICD-10-CM | POA: Diagnosis not present

## 2018-03-06 LAB — CBC
HEMATOCRIT: 38.9 % (ref 37.5–51.0)
HEMOGLOBIN: 13.6 g/dL (ref 13.0–17.7)
MCH: 31.3 pg (ref 26.6–33.0)
MCHC: 35 g/dL (ref 31.5–35.7)
MCV: 90 fL (ref 79–97)
Platelets: 250 10*3/uL (ref 150–450)
RBC: 4.34 x10E6/uL (ref 4.14–5.80)
RDW: 14.1 % (ref 12.3–15.4)
WBC: 11.4 10*3/uL — ABNORMAL HIGH (ref 3.4–10.8)

## 2018-03-06 LAB — BASIC METABOLIC PANEL
BUN / CREAT RATIO: 18 (ref 10–24)
BUN: 33 mg/dL — AB (ref 8–27)
CALCIUM: 9.9 mg/dL (ref 8.6–10.2)
CHLORIDE: 94 mmol/L — AB (ref 96–106)
CO2: 24 mmol/L (ref 20–29)
CREATININE: 1.79 mg/dL — AB (ref 0.76–1.27)
GFR, EST AFRICAN AMERICAN: 41 mL/min/{1.73_m2} — AB (ref >59)
GFR, EST NON AFRICAN AMERICAN: 35 mL/min/{1.73_m2} — AB (ref >59)
Glucose: 113 mg/dL — ABNORMAL HIGH (ref 65–99)
Potassium: 4.6 mmol/L (ref 3.5–5.2)
Sodium: 135 mmol/L (ref 134–144)

## 2018-03-07 NOTE — Progress Notes (Signed)
HPI: FU atrial fibrillation.  He has a past history of paroxysmal atrial fibrillation and tachycardia mediated cardiomyopathy. He underwent mapping and ablation of atrial fib in 11/2012 by Dr. [name].  Last echocardiogram August 2015 showed normal LV systolic function, grade 2 diastolic dysfunction, mild mitral regurgitation and moderate left atrial enlargement. Carotid dopplers April 2017 normal. At previous office visit he wanted to try to discontinue amiodarone.  However he contacted the office and felt to be back in atrial fibrillation.  Amiodarone was resumed.  Patient had repeat cardioversion March 11, 2018. Since last seen,he is improving but still has some dyspnea on exertion.  No orthopnea, PND, pedal edema, chest pain or syncope.  No bleeding.  Current Outpatient Medications  Medication Sig Dispense Refill  . acetaminophen (TYLENOL ARTHRITIS PAIN) 650 MG CR tablet Take 650 mg by mouth every 8 (eight) hours as needed for pain.     Marland Kitchen amiodarone (PACERONE) 200 MG tablet Take 1 tablet (200 mg total) by mouth 2 (two) times daily. 180 tablet 1  . Ascorbic Acid (VITAMIN C) 500 MG tablet Take 1,000 mg by mouth 2 (two) times daily.     . Cholecalciferol (VITAMIN D) 2000 UNITS CAPS Take 4,000 Units by mouth every morning.     . Coenzyme Q10 (COQ10 PO) Take 100 mg by mouth every morning.     . edoxaban ([name]) 60 MG TABS tablet Take 30 mg by mouth daily. (Patient taking differently: Take 30 mg by mouth every evening. ) 90 tablet 3  . enalapril (VASOTEC) 20 MG tablet TAKE ONE-HALF TABLET BY MOUTH ONCE DAILY IN THE EVENING 135 tablet 3  . furosemide (LASIX) 40 MG tablet TAKE ONE & ONE-HALF TABLETS BY MOUTH ONCE DAILY IN THE MORNING 135 tablet 3  . guaiFENesin (MUCINEX) 600 MG 12 hr tablet Take 600 mg by mouth 2 (two) times daily as needed for congestion. Reported on 12/14/2015    . IRON PO Take 1 tablet by mouth daily at 12 noon.     Marland Kitchen levothyroxine (SYNTHROID, LEVOTHROID) 100 MCG  tablet TAKE 1 TABLET BY MOUTH ONCE DAILY BEFORE BREAKFAST 90 tablet 1  . magnesium oxide (MAG-OX) 400 MG tablet Take 400 mg by mouth daily at 12 noon.    . metolazone (ZAROXOLYN) 5 MG tablet Take 1 tablet (5 mg total) by mouth daily as needed (fluid retention). 15 tablet 1  . metoprolol succinate (TOPROL-XL) 50 MG 24 hr tablet TAKE 1 TABLET BY MOUTH IN THE MORNING AND TAKE 1/2 TABLET IN THE EVENING 135 tablet 3  . Misc Natural Products (OSTEO BI-FLEX JOINT SHIELD PO) Take 2 tablets by mouth every morning.     . Multiple Vitamin (MULTIVITAMIN) tablet Take 1 tablet by mouth daily at 12 noon.     . Omega-3 Fatty Acids (FISH OIL) 1000 MG CAPS Take 1,000 mg by mouth 2 (two) times daily.     Marland Kitchen omeprazole (PRILOSEC) 40 MG capsule Take 1 capsule (40 mg total) by mouth daily. 90 capsule 1  . polyvinyl alcohol (LIQUIFILM TEARS) 1.4 % ophthalmic solution Place 1 drop into both eyes daily as needed for dry eyes.    Marland Kitchen zinc gluconate 50 MG tablet Take 50 mg by mouth daily at 12 noon.      No current facility-administered medications for this visit.      Past Medical History:  Diagnosis Date  . Arthritis   . Atrial dilatation    mild biatrial dilitation  . Bruises easily   .  CHF (congestive heart failure) (HCC)   . CKD (chronic kidney disease), stage III (HCC)   . Coronary artery disease   . Dysrhythmia    atrial fibrillation  . GERD (gastroesophageal reflux disease)   . H/O hiatal hernia   . Hypercholesterolemia   . Hyperglycemia   . Hypertension   . Hypothyroidism   . Mitral regurgitation    a. Mild by echo in 01/2014.  Marland Kitchen Nonischemic cardiomyopathy (HCC)    a. presumed to be tachycardia mediated. Varying EFs over the years from 35-40% to normal and back.  . Paroxysmal atrial fibrillation (HCC)    a. H/o difficult to control. b. s/p PVI ablation 12-03-2012 by [name] with recurrence afterwards, requiring period of amiodarone.  . Peripheral edema   . Sleep apnea    USES C PAP  . Tear of  medial meniscus of left knee   . Tremor     Past Surgical History:  Procedure Laterality Date  . ATRIAL FIBRILLATION ABLATION N/A 12/03/2012   Procedure: ATRIAL FIBRILLATION ABLATION;  Surgeon: Hillis Range, MD;  Location[facility] CATH LAB;  Service: Cardiovascular;  Laterality: N/A;  . ATRIAL FIBRILLATION ABLATION     PVI by [name]  . CARDIAC CATHETERIZATION    . CARDIOVERSION N/A 09/04/2012   Procedure: CARDIOVERSION;  Surgeon: [name], MD;  Location: [facility] ENDOSCOPY;  Service: Cardiovascular;  Laterality: N/A;  . CARDIOVERSION N/A 02/11/2013   Procedure: CARDIOVERSION;  Surgeon: [name], MD;  Location: [facility] ENDOSCOPY;  Service: Cardiovascular;  Laterality: N/A;  . CARDIOVERSION N/A 12/07/2015   Procedure: CARDIOVERSION;  Surgeon: Chrystie Nose, MD;  Location: [facility] ENDOSCOPY;  Service: Cardiovascular;  Laterality: N/A;  . CARDIOVERSION N/A 03/11/2018   Procedure: CARDIOVERSION;  Surgeon: Lars Masson, MD;  Location: [facility] ENDOSCOPY;  Service: Cardiovascular;  Laterality: N/A;  . EYE SURGERY  2015,2016   bilateral cataract surgery with lens implants  . HERNIA REPAIR  03/2005  . JOINT REPLACEMENT  2005   total right hip replacement  . kidney stent  1987, 02/2005  . KIDNEY STONE SURGERY     2 stents placed  . KNEE ARTHROSCOPY WITH DRILLING/MICROFRACTURE Left 11/20/2013   Procedure: KNEE ARTHROSCOPY WITH DRILLING/MICROFRACTURE;  Surgeon: Jacki Cones, MD;  Location: [facility] ORS;  Service: Orthopedics;  Laterality: Left;  . KNEE ARTHROSCOPY WITH MEDIAL MENISECTOMY Left 11/20/2013   Procedure: LEFT KNEE ARTHROSCOPY WITH MEDIAL MENISECTOMY;  Surgeon: Jacki Cones, MD;  Location: [facility] ORS;  Service: Orthopedics;  Laterality: Left;  Microfracture of medial femoral chondyle and abrasion chondroplasty of the medial femoral chondyle  . LITHOTRIPSY     x 2  . PICC line placement  2007  . quadrec  1999   LFT leg quad sx  . right hip surgery  2007   irrigation and debridement from  bacteria  . TEE WITHOUT CARDIOVERSION N/A 12/02/2012   Procedure: TRANSESOPHAGEAL ECHOCARDIOGRAM (TEE);  Surgeon: [name], MD;  Location: [facility] ENDOSCOPY;  Service: Cardiovascular;  Laterality: N/A;  . TOTAL HIP ARTHROPLASTY Left 08/12/2015   Procedure: LEFT TOTAL HIP ARTHROPLASTY;  Surgeon: Ranee Gosselin, MD;  Location: [facility] ORS;  Service: Orthopedics;  Laterality: Left;    Social History   Socioeconomic History  . Marital status: Married    Spouse name: Not on file  . Number of children: Not on file  . Years of education: Not on file  . Highest education level: Not on file  Occupational History  . Occupation: Recruitment consultant:  RETIRED  Social Needs  . Financial resource strain: Not on file  . Food insecurity:    Worry: Not on file    Inability: Not on file  . Transportation needs:    Medical: Not on file    Non-medical: Not on file  Tobacco Use  . Smoking status: Never Smoker  . Smokeless tobacco: Never Used  Substance and Sexual Activity  . Alcohol use: No  . Drug use: No  . Sexual activity: Not on file  Lifestyle  . Physical activity:    Days per week: Not on file    Minutes per session: Not on file  . Stress: Not on file  Relationships  . Social connections:    Talks on phone: Not on file    Gets together: Not on file    Attends religious service: Not on file    Active member of club or organization: Not on file    Attends meetings of clubs or organizations: Not on file    Relationship status: Not on file  . Intimate partner violence:    Fear of current or ex partner: Not on file    Emotionally abused: Not on file    Physically abused: Not on file    Forced sexual activity: Not on file  Other Topics Concern  . Not on file  Social History Narrative   Lives in [place] with spouse.  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   [employer]:   Has worked extensively in [employer]. Significant  asbestos asbestos exposure. No bird or mold exposure.     Family History  Problem Relation Age of Onset  . Cancer Mother 4       CANCER  . Heart disease Father 89       HEART PROBLEMS  . Lupus Brother        "of the skin"    ROS: no fevers or chills, productive cough, hemoptysis, dysphasia, odynophagia, melena, hematochezia, dysuria, hematuria, rash, seizure activity, orthopnea, PND, pedal edema, claudication. Remaining systems are negative.  Physical Exam: Well-developed well-nourished in no acute distress.  Skin is warm and dry.  HEENT is normal.  Neck is supple.  Chest is clear to auscultation with normal expansion.  Cardiovascular exam is regular rate and rhythm.  2/6 systolic murmur apex Abdominal exam nontender or distended. No masses palpated. Extremities show no edema. neuro grossly intact  ECG-sinus rhythm at a rate of 55.  Nonspecific T wave changes.  Personally reviewed  A/P  1 Paroxysmal atrial fibrillation-patient remains in sinus rhythm today.  Not clear to me that amiodarone was contributing to his tremor previously and he had recurrent atrial fibrillation off of this medication.  We will continue but decreased to 200 mg daily.  Note he does have a family history of tremor.  We will continue edoxaban.    2 hypertension-blood pressure is controlled.  Continue present medications.  3 chronic stage III kidney disease-followed by primary care.  4 mitral regurgitation murmur on examination.  Repeat echocardiogram.  Olga Millers, MD

## 2018-03-11 ENCOUNTER — Ambulatory Visit (HOSPITAL_COMMUNITY)
Admission: RE | Admit: 2018-03-11 | Discharge: 2018-03-11 | Disposition: A | Discharge status: Home / Self Care | Payer: Medicare Other | Source: Ambulatory Visit | Attending: Cardiology | Admitting: Cardiology

## 2018-03-11 ENCOUNTER — Encounter (HOSPITAL_COMMUNITY): Payer: Self-pay | Admitting: *Deleted

## 2018-03-11 ENCOUNTER — Ambulatory Visit (HOSPITAL_COMMUNITY): Payer: Medicare Other | Admitting: Certified Registered"

## 2018-03-11 ENCOUNTER — Encounter (HOSPITAL_COMMUNITY)
Admission: RE | Disposition: A | Discharge status: Home / Self Care | Payer: Self-pay | Source: Ambulatory Visit | Attending: Cardiology

## 2018-03-11 DIAGNOSIS — I272 Pulmonary hypertension, unspecified: Secondary | ICD-10-CM | POA: Insufficient documentation

## 2018-03-11 DIAGNOSIS — I429 Cardiomyopathy, unspecified: Secondary | ICD-10-CM | POA: Diagnosis not present

## 2018-03-11 DIAGNOSIS — I130 Hypertensive heart and chronic kidney disease with heart failure and stage 1 through stage 4 chronic kidney disease, or unspecified chronic kidney disease: Secondary | ICD-10-CM | POA: Diagnosis not present

## 2018-03-11 DIAGNOSIS — R251 Tremor, unspecified: Secondary | ICD-10-CM | POA: Insufficient documentation

## 2018-03-11 DIAGNOSIS — E7800 Pure hypercholesterolemia, unspecified: Secondary | ICD-10-CM | POA: Diagnosis not present

## 2018-03-11 DIAGNOSIS — M1990 Unspecified osteoarthritis, unspecified site: Secondary | ICD-10-CM | POA: Diagnosis not present

## 2018-03-11 DIAGNOSIS — R000 Tachycardia, unspecified: Secondary | ICD-10-CM | POA: Diagnosis not present

## 2018-03-11 DIAGNOSIS — E039 Hypothyroidism, unspecified: Secondary | ICD-10-CM | POA: Diagnosis not present

## 2018-03-11 DIAGNOSIS — I4891 Unspecified atrial fibrillation: Secondary | ICD-10-CM | POA: Diagnosis not present

## 2018-03-11 DIAGNOSIS — G4730 Sleep apnea, unspecified: Secondary | ICD-10-CM | POA: Insufficient documentation

## 2018-03-11 DIAGNOSIS — N189 Chronic kidney disease, unspecified: Secondary | ICD-10-CM | POA: Diagnosis not present

## 2018-03-11 DIAGNOSIS — N183 Chronic kidney disease, stage 3 (moderate): Secondary | ICD-10-CM | POA: Insufficient documentation

## 2018-03-11 DIAGNOSIS — K219 Gastro-esophageal reflux disease without esophagitis: Secondary | ICD-10-CM | POA: Insufficient documentation

## 2018-03-11 DIAGNOSIS — Z880 Allergy status to penicillin: Secondary | ICD-10-CM | POA: Insufficient documentation

## 2018-03-11 DIAGNOSIS — Z8249 Family history of ischemic heart disease and other diseases of the circulatory system: Secondary | ICD-10-CM | POA: Insufficient documentation

## 2018-03-11 DIAGNOSIS — I509 Heart failure, unspecified: Secondary | ICD-10-CM | POA: Diagnosis not present

## 2018-03-11 DIAGNOSIS — Z7901 Long term (current) use of anticoagulants: Secondary | ICD-10-CM | POA: Diagnosis not present

## 2018-03-11 DIAGNOSIS — I2510 Atherosclerotic heart disease of native coronary artery without angina pectoris: Secondary | ICD-10-CM | POA: Insufficient documentation

## 2018-03-11 DIAGNOSIS — I340 Nonrheumatic mitral (valve) insufficiency: Secondary | ICD-10-CM | POA: Diagnosis not present

## 2018-03-11 DIAGNOSIS — I480 Paroxysmal atrial fibrillation: Secondary | ICD-10-CM

## 2018-03-11 HISTORY — PX: CARDIOVERSION: SHX1299

## 2018-03-11 SURGERY — CARDIOVERSION
Anesthesia: General

## 2018-03-11 MED ORDER — LIDOCAINE 2% (20 MG/ML) 5 ML SYRINGE
INTRAMUSCULAR | Status: DC | PRN
  Administered 2018-03-11: 60 mg via INTRAVENOUS

## 2018-03-11 MED ORDER — PROPOFOL 10 MG/ML IV BOLUS
INTRAVENOUS | Status: DC | PRN
  Administered 2018-03-11: 100 mg via INTRAVENOUS

## 2018-03-11 NOTE — Anesthesia Preprocedure Evaluation (Signed)
Anesthesia Evaluation  Patient identified by MRN, date of birth, ID band Patient awake    Reviewed: Allergy & Precautions, NPO status , Patient's Chart, lab work & pertinent test results  Airway Mallampati: I  TM Distance: >3 FB Neck ROM: Full    Dental   Pulmonary sleep apnea ,    Pulmonary exam normal        Cardiovascular hypertension, Pt. on medications + CAD  Normal cardiovascular exam+ dysrhythmias Atrial Fibrillation      Neuro/Psych    GI/Hepatic GERD  Medicated and Controlled,  Endo/Other    Renal/GU Renal InsufficiencyRenal disease     Musculoskeletal   Abdominal   Peds  Hematology   Anesthesia Other Findings   Reproductive/Obstetrics                             Anesthesia Physical Anesthesia Plan  ASA: III  Anesthesia Plan: General   Post-op Pain Management:    Induction: Intravenous  PONV Risk Score and Plan: 2 and Treatment may vary due to age or medical condition  Airway Management Planned: Mask  Additional Equipment:   Intra-op Plan:   Post-operative Plan: Extubation in OR  Informed Consent: I have reviewed the patients History and Physical, chart, labs and discussed the procedure including the risks, benefits and alternatives for the proposed anesthesia with the patient or authorized representative who has indicated his/her understanding and acceptance.     Plan Discussed with: CRNA and Surgeon  Anesthesia Plan Comments:         Anesthesia Quick Evaluation

## 2018-03-11 NOTE — Discharge Instructions (Signed)
Electrical Cardioversion, Care After °This sheet gives you information about how to care for yourself after your procedure. Your health care provider may also give you more specific instructions. If you have problems or questions, contact your health care provider. °What can I expect after the procedure? °After the procedure, it is common to have: °· Some redness on the skin where the shocks were given. ° °Follow these instructions at home: °· Do not drive for 24 hours if you were given a medicine to help you relax (sedative). °· Take over-the-counter and prescription medicines only as told by your health care provider. °· Ask your health care provider how to check your pulse. Check it often. °· Rest for 48 hours after the procedure or as told by your health care provider. °· Avoid or limit your caffeine use as told by your health care provider. °Contact a health care provider if: °· You feel like your heart is beating too quickly or your pulse is not regular. °· You have a serious muscle cramp that does not go away. °Get help right away if: °· You have discomfort in your chest. °· You are dizzy or you feel faint. °· You have trouble breathing or you are short of breath. °· Your speech is slurred. °· You have trouble moving an arm or leg on one side of your body. °· Your fingers or toes turn cold or blue. °This information is not intended to replace advice given to you by your health care provider. Make sure you discuss any questions you have with your health care provider. °Document Released: 04/02/2013 Document Revised: 01/14/2016 Document Reviewed: 12/17/2015 °Elsevier Interactive Patient Education © 2018 Else[employer] Inc[employer] ° °

## 2018-03-11 NOTE — Transfer of Care (Signed)
Immediate Anesthesia Transfer of Care Note  Patient: Charles [name]  Procedure(s) Performed: CARDIOVERSION (N/A )  Patient Location: Endoscopy Unit  Anesthesia Type:General  Level of Consciousness: awake, alert  and oriented  Airway & Oxygen Therapy: Patient Spontanous Breathing and Patient connected to nasal cannula oxygen  Post-op Assessment: Report given to RN and Post -op Vital signs reviewed and stable  Post vital signs: Reviewed and stable  Last Vitals:  Vitals Value Taken Time  BP    Temp    Pulse    Resp    SpO2      Last Pain:  Vitals:   03/11/18 0744  TempSrc: Oral  PainSc: 0-No pain         Complications: No apparent anesthesia complications

## 2018-03-11 NOTE — Interval H&P Note (Signed)
History and Physical Interval Note:  03/11/2018 9:03 AM  Charles [name]  has presented today for surgery, with the diagnosis of afib  The various methods of treatment have been discussed with the patient and family. After consideration of risks, benefits and other options for treatment, the patient has consented to  Procedure(s): CARDIOVERSION (N/A) as a surgical intervention .  The patient's history has been reviewed, patient examined, no change in status, stable for surgery.  I have reviewed the patient's chart and labs.  Questions were answered to the patient's satisfaction.     Tobias Alexander

## 2018-03-11 NOTE — Anesthesia Procedure Notes (Signed)
Procedure Name: MAC Date/Time: 03/11/2018 9:54 AM Performed by: Teressa Lower., CRNA Pre-anesthesia Checklist: Patient identified, Emergency Drugs available, Suction available, Patient being monitored and Timeout performed Patient Re-evaluated:Patient Re-evaluated prior to induction Oxygen Delivery Method: Nasal cannula

## 2018-03-11 NOTE — Anesthesia Postprocedure Evaluation (Signed)
Anesthesia Post Note  Patient: Charles [name]  Procedure(s) Performed: CARDIOVERSION (N/A )     Patient location during evaluation: PACU Anesthesia Type: General Level of consciousness: awake and alert Pain management: pain level controlled Vital Signs Assessment: post-procedure vital signs reviewed and stable Respiratory status: spontaneous breathing, nonlabored ventilation, respiratory function stable and patient connected to nasal cannula oxygen Cardiovascular status: blood pressure returned to baseline and stable Postop Assessment: no apparent nausea or vomiting Anesthetic complications: no    Last Vitals:  Vitals:   03/11/18 1000 03/11/18 1010  BP: (!) 87/69 104/63  Pulse: (!) 49 (!) 50  Resp: 18 16  Temp:    SpO2: 100% 100%    Last Pain:  Vitals:   03/11/18 0957  TempSrc: Oral  PainSc:                  Kaitlan Bin DAVID

## 2018-03-11 NOTE — CV Procedure (Signed)
    Cardioversion Note  Charles [name] [id number] 08-Dec-1940  Procedure: DC Cardioversion Indications: atrial fibrillation  Procedure Details Consent: Obtained Time Out: Verified patient identification, verified procedure, site/side was marked, verified correct patient position, special equipment/implants available, Radiology Safety Procedures followed,  medications/allergies/relevent history reviewed, required imaging and test results available.  Performed  The patient has been on adequate anticoagulation.  The patient received IV propofol administered by anesthesia staff for sedation.  Synchronous cardioversion was performed at 120 joules.  The cardioversion was successful.   Complications: No apparent complications Patient did tolerate procedure well.   Charles Alexander, MD, [facility] 03/11/2018, 10:49 AM

## 2018-03-18 ENCOUNTER — Encounter: Payer: Self-pay | Admitting: Cardiology

## 2018-03-18 ENCOUNTER — Ambulatory Visit: Payer: Medicare Other | Admitting: Cardiology

## 2018-03-18 VITALS — BP 138/64 | HR 55 | Ht 70.0 in | Wt 193.0 lb

## 2018-03-18 DIAGNOSIS — I481 Persistent atrial fibrillation: Secondary | ICD-10-CM

## 2018-03-18 DIAGNOSIS — I340 Nonrheumatic mitral (valve) insufficiency: Secondary | ICD-10-CM | POA: Diagnosis not present

## 2018-03-18 DIAGNOSIS — I4819 Other persistent atrial fibrillation: Secondary | ICD-10-CM

## 2018-03-18 MED ORDER — AMIODARONE HCL 200 MG PO TABS: 200.0000 mg | ORAL_TABLET | Freq: Every day | ORAL | 1 refills | Status: DC

## 2018-03-18 NOTE — Patient Instructions (Signed)
Medication Instructions:   DECREASE AMIODARONE TO 200 MG ONCE DAILY  Testing/Procedures:  Your physician has requested that you have an echocardiogram. Echocardiography is a painless test that uses sound waves to create images of your heart. It provides your doctor with information about the size and shape of your heart and how well your heart's chambers and valves are working. This procedure takes approximately one hour. There are no restrictions for this procedure.    Follow-Up:  Your physician wants you to follow-up in: 4 MONTHS WITH DR Jens Som You will receive a reminder letter in the mail two months in advance. If you don't receive a letter, please call our office to schedule the follow-up appointment.   If you need a refill on your cardiac medications before your next appointment, please call your pharmacy.

## 2018-03-19 ENCOUNTER — Other Ambulatory Visit: Payer: Self-pay

## 2018-03-19 ENCOUNTER — Ambulatory Visit (HOSPITAL_COMMUNITY): Payer: Medicare Other | Attending: Cardiology

## 2018-03-19 DIAGNOSIS — I509 Heart failure, unspecified: Secondary | ICD-10-CM | POA: Insufficient documentation

## 2018-03-19 DIAGNOSIS — I4891 Unspecified atrial fibrillation: Secondary | ICD-10-CM | POA: Insufficient documentation

## 2018-03-19 DIAGNOSIS — N189 Chronic kidney disease, unspecified: Secondary | ICD-10-CM | POA: Diagnosis not present

## 2018-03-19 DIAGNOSIS — G4733 Obstructive sleep apnea (adult) (pediatric): Secondary | ICD-10-CM | POA: Insufficient documentation

## 2018-03-19 DIAGNOSIS — I428 Other cardiomyopathies: Secondary | ICD-10-CM | POA: Diagnosis not present

## 2018-03-19 DIAGNOSIS — Z8249 Family history of ischemic heart disease and other diseases of the circulatory system: Secondary | ICD-10-CM | POA: Diagnosis not present

## 2018-03-19 DIAGNOSIS — I340 Nonrheumatic mitral (valve) insufficiency: Secondary | ICD-10-CM

## 2018-03-19 DIAGNOSIS — I081 Rheumatic disorders of both mitral and tricuspid valves: Secondary | ICD-10-CM | POA: Insufficient documentation

## 2018-03-19 DIAGNOSIS — I2510 Atherosclerotic heart disease of native coronary artery without angina pectoris: Secondary | ICD-10-CM | POA: Insufficient documentation

## 2018-03-19 DIAGNOSIS — E785 Hyperlipidemia, unspecified: Secondary | ICD-10-CM | POA: Insufficient documentation

## 2018-03-19 DIAGNOSIS — I130 Hypertensive heart and chronic kidney disease with heart failure and stage 1 through stage 4 chronic kidney disease, or unspecified chronic kidney disease: Secondary | ICD-10-CM | POA: Diagnosis not present

## 2018-06-29 ENCOUNTER — Other Ambulatory Visit: Payer: Self-pay | Admitting: Cardiology

## 2018-07-10 NOTE — Progress Notes (Signed)
HPI: FU atrial fibrillation.  He has a past history of paroxysmal atrial fibrillation and tachycardia mediated cardiomyopathy. He underwent mapping and ablation of atrial fib in 11/2012 by Dr. Johney FrameAllred. Carotid dopplers April 2017 normal. At previous office visit he wanted to try to discontinue amiodarone.  However he contacted the office and felt to be back in atrial fibrillation.  Amiodarone was resumed.  Patient had repeat cardioversion March 11, 2018.  Last echocardiogram September 2019 showed normal LV function, moderate diastolic dysfunction, mild to moderate mitral regurgitation, biatrial enlargement and moderate tricuspid regurgitation. Since last seen,he denies dyspnea, chest pain, palpitations, syncope or bleeding.  He does note some fatigue and his heart rate is running in the 40s at times.  Current Outpatient Medications  Medication Sig Dispense Refill  . acetaminophen (TYLENOL ARTHRITIS PAIN) 650 MG CR tablet Take 650 mg by mouth every 8 (eight) hours as needed for pain.     Marland Kitchen. amiodarone (PACERONE) 200 MG tablet Take 1 tablet (200 mg total) by mouth daily. 180 tablet 1  . Ascorbic Acid (VITAMIN C) 500 MG tablet Take 1,000 mg by mouth 2 (two) times daily.     . Cholecalciferol (VITAMIN D) 2000 UNITS CAPS Take 2,000 Units by mouth 2 (two) times daily.     . Coenzyme Q10 (COQ10 PO) Take 100 mg by mouth every morning.     . edoxaban ([name]) 60 MG TABS tablet Take 30 mg by mouth daily. (Patient taking differently: Take 30 mg by mouth every evening. ) 90 tablet 3  . enalapril (VASOTEC) 20 MG tablet TAKE ONE-HALF TABLET BY MOUTH ONCE DAILY IN THE EVENING 135 tablet 3  . furosemide (LASIX) 40 MG tablet TAKE ONE & ONE-HALF TABLETS BY MOUTH ONCE DAILY IN THE MORNING 135 tablet 3  . guaiFENesin (MUCINEX) 600 MG 12 hr tablet Take 600 mg by mouth 2 (two) times daily as needed for congestion. Reported on 12/14/2015    . IRON PO Take 1 tablet by mouth daily at 12 noon.     Marland Kitchen.  levothyroxine (SYNTHROID, LEVOTHROID) 100 MCG tablet TAKE 1 TABLET BY MOUTH ONCE DAILY BEFORE BREAKFAST 90 tablet 1  . magnesium oxide (MAG-OX) 400 MG tablet Take 400 mg by mouth daily at 12 noon.    . metoprolol succinate (TOPROL-XL) 50 MG 24 hr tablet TAKE ONE TABLET BY MOUTH EVERY MORNING AND TAKE ONE-HALF TABLET BY MOUTH EVERY EVENING 135 tablet 0  . Misc Natural Products (OSTEO BI-FLEX JOINT SHIELD PO) Take 2 tablets by mouth every morning.     . Multiple Vitamin (MULTIVITAMIN) tablet Take 1 tablet by mouth daily at 12 noon.     . Omega-3 Fatty Acids (FISH OIL) 1000 MG CAPS Take 1,000 mg by mouth 2 (two) times daily.     Marland Kitchen. omeprazole (PRILOSEC) 40 MG capsule Take 1 capsule (40 mg total) by mouth daily. 90 capsule 1  . polyvinyl alcohol (LIQUIFILM TEARS) 1.4 % ophthalmic solution Place 1 drop into both eyes daily as needed for dry eyes.    Marland Kitchen. zinc gluconate 50 MG tablet Take 50 mg by mouth daily at 12 noon.      No current facility-administered medications for this visit.      Past Medical History:  Diagnosis Date  . Arthritis   . Atrial dilatation    mild biatrial dilitation  . Bruises easily   . CHF (congestive heart failure) (HCC)   . CKD (chronic kidney disease), stage III (HCC)   .  Coronary artery disease   . Dysrhythmia    atrial fibrillation  . GERD (gastroesophageal reflux disease)   . H/O hiatal hernia   . Hypercholesterolemia   . Hyperglycemia   . Hypertension   . Hypothyroidism   . Mitral regurgitation    a. Mild by echo in 01/2014.  Marland Kitchen Nonischemic cardiomyopathy (HCC)    a. presumed to be tachycardia mediated. Varying EFs over the years from 35-40% to normal and back.  . Paroxysmal atrial fibrillation (HCC)    a. H/o difficult to control. b. s/p PVI ablation 12-03-2012 by [name] with recurrence afterwards, requiring period of amiodarone.  . Peripheral edema   . Sleep apnea    USES C PAP  . Tear of medial meniscus of left knee   . Tremor     Past Surgical  History:  Procedure Laterality Date  . ATRIAL FIBRILLATION ABLATION N/A 12/03/2012   Procedure: ATRIAL FIBRILLATION ABLATION;  Surgeon: Hillis Range, MD;  Location: [facility] CATH LAB;  Service: Cardiovascular;  Laterality: N/A;  . ATRIAL FIBRILLATION ABLATION     PVI by [name]  . CARDIAC CATHETERIZATION    . CARDIOVERSION N/A 09/04/2012   Procedure: CARDIOVERSION;  Surgeon: [name], MD;  Location: [facility] ENDOSCOPY;  Service: Cardiovascular;  Laterality: N/A;  . CARDIOVERSION N/A 02/11/2013   Procedure: CARDIOVERSION;  Surgeon: [name], MD;  Location: [facility] ENDOSCOPY;  Service: Cardiovascular;  Laterality: N/A;  . CARDIOVERSION N/A 12/07/2015   Procedure: CARDIOVERSION;  Surgeon: Chrystie Nose, MD;  Location: [facility] ENDOSCOPY;  Service: Cardiovascular;  Laterality: N/A;  . CARDIOVERSION N/A 03/11/2018   Procedure: CARDIOVERSION;  Surgeon: Lars Masson, MD;  Location: [facility] ENDOSCOPY;  Service: Cardiovascular;  Laterality: N/A;  . EYE SURGERY  2015,2016   bilateral cataract surgery with lens implants  . HERNIA REPAIR  03/2005  . JOINT REPLACEMENT  2005   total right hip replacement  . kidney stent  1987, 02/2005  . KIDNEY STONE SURGERY     2 stents placed  . KNEE ARTHROSCOPY WITH DRILLING/MICROFRACTURE Left 11/20/2013   Procedure: KNEE ARTHROSCOPY WITH DRILLING/MICROFRACTURE;  Surgeon: Jacki Cones, MD;  Location: [facility] ORS;  Service: Orthopedics;  Laterality: Left;  . KNEE ARTHROSCOPY WITH MEDIAL MENISECTOMY Left 11/20/2013   Procedure: LEFT KNEE ARTHROSCOPY WITH MEDIAL MENISECTOMY;  Surgeon: Jacki Cones, MD;  Location: [facility] ORS;  Service: Orthopedics;  Laterality: Left;  Microfracture of medial femoral chondyle and abrasion chondroplasty of the medial femoral chondyle  . LITHOTRIPSY     x 2  . PICC line placement  2007  . quadrec  1999   LFT leg quad sx  . right hip surgery  2007   irrigation and debridement from bacteria  . TEE WITHOUT CARDIOVERSION N/A 12/02/2012   Procedure:  TRANSESOPHAGEAL ECHOCARDIOGRAM (TEE);  Surgeon: [name], MD;  Location: [facility] ENDOSCOPY;  Service: Cardiovascular;  Laterality: N/A;  . TOTAL HIP ARTHROPLASTY Left 08/12/2015   Procedure: LEFT TOTAL HIP ARTHROPLASTY;  Surgeon: Ranee Gosselin, MD;  Location: [facility] ORS;  Service: Orthopedics;  Laterality: Left;    Social History   Socioeconomic History  . Marital status: Married    Spouse name: Not on file  . Number of children: Not on file  . Years of education: Not on file  . Highest education level: Not on file  Occupational History  . Occupation: Recruitment consultant: RETIRED  Social Needs  . Financial resource strain: Not on file  . Food insecurity:  Worry: Not on file    Inability: Not on file  . Transportation needs:    Medical: Not on file    Non-medical: Not on file  Tobacco Use  . Smoking status: Never Smoker  . Smokeless tobacco: Never Used  Substance and Sexual Activity  . Alcohol use: No  . Drug use: No  . Sexual activity: Not on file  Lifestyle  . Physical activity:    Days per week: Not on file    Minutes per session: Not on file  . Stress: Not on file  Relationships  . Social connections:    Talks on phone: Not on file    Gets together: Not on file    Attends religious service: Not on file    Active member of club or organization: Not on file    Attends meetings of clubs or organizations: Not on file    Relationship status: Not on file  . Intimate partner violence:    Fear of current or ex partner: Not on file    Emotionally abused: Not on file    Physically abused: Not on file    Forced sexual activity: Not on file  Other Topics Concern  . Not on file  Social History Narrative   Lives in [place] with spouse.  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   [employer]:   Has worked extensively in [employer]. Significant asbestos asbestos exposure. No bird or mold exposure.     Family  History  Problem Relation Age of Onset  . Cancer Mother 50       CANCER  . Heart disease Father 52       HEART PROBLEMS  . Lupus Brother        "of the skin"    ROS: no fevers or chills, productive cough, hemoptysis, dysphasia, odynophagia, melena, hematochezia, dysuria, hematuria, rash, seizure activity, orthopnea, PND, pedal edema, claudication. Remaining systems are negative.  Physical Exam: Well-developed well-nourished in no acute distress.  Skin is warm and dry.  HEENT is normal.  Neck is supple.  Chest is clear to auscultation with normal expansion.  Cardiovascular exam is regular rate and rhythm.  Abdominal exam nontender or distended. No masses palpated. Extremities show no edema. neuro grossly intact  A/P  1 paroxysmal atrial fibrillation-patient remains in sinus rhythm today.  Continue amiodarone at present dose.  Continue edoxaban.  Check hemoglobin, renal function, TSH, liver functions and chest x-ray.  2 history of mild to moderate mitral regurgitation-he will need follow-up echoes in the future.  3 hypertension-patient's blood pressure is controlled.  However he complains of lack of energy and his heart rate is in the 40s at times.  I will therefore decrease metoprolol to 25 mg nightly.  Follow pulse and blood pressure at home.  If he continues to be bradycardic we will discontinue Toprol altogether.  Adjust blood pressure medications based on follow-up readings.  4 chronic stage III kidney disease-check renal function.  Olga Millers, MD

## 2018-07-16 ENCOUNTER — Ambulatory Visit: Payer: Medicare Other | Admitting: Cardiology

## 2018-07-16 ENCOUNTER — Encounter: Payer: Self-pay | Admitting: Cardiology

## 2018-07-16 VITALS — BP 132/60 | HR 52 | Ht 70.0 in | Wt 194.0 lb

## 2018-07-16 DIAGNOSIS — I480 Paroxysmal atrial fibrillation: Secondary | ICD-10-CM

## 2018-07-16 DIAGNOSIS — I340 Nonrheumatic mitral (valve) insufficiency: Secondary | ICD-10-CM | POA: Diagnosis not present

## 2018-07-16 DIAGNOSIS — I10 Essential (primary) hypertension: Secondary | ICD-10-CM

## 2018-07-16 DIAGNOSIS — E039 Hypothyroidism, unspecified: Secondary | ICD-10-CM

## 2018-07-16 MED ORDER — METOPROLOL SUCCINATE ER 25 MG PO TB24: 25.0000 mg | ORAL_TABLET | Freq: Every day | ORAL | 3 refills | Status: DC

## 2018-07-16 MED ORDER — LEVOTHYROXINE SODIUM 100 MCG PO TABS: ORAL_TABLET | ORAL | 3 refills | Status: DC

## 2018-07-16 NOTE — Patient Instructions (Signed)
Medication Instructions:  DECREASE METOPROLOL TO 25 MG ONCE DAILY AT BEDTIME=1/2 OF THE 50 MG TABLET AT BEDTIME If you need a refill on your cardiac medications before your next appointment, please call your pharmacy.   Lab work: Your physician recommends that you HAVE LAB WORK TODAY If you have labs (blood work) drawn today and your tests are completely normal, you will receive your results only by: Marland Kitchen MyChart Message (if you have MyChart) OR . A paper copy in the mail If you have any lab test that is abnormal or we need to change your treatment, we will call you to review the results.  Testing/Procedures: A chest x-ray takes a picture of the organs and structures inside the chest, including the heart, lungs, and blood vessels. This test can show several things, including, whether the heart is enlarges; whether fluid is building up in the lungs; and whether pacemaker / defibrillator leads are still in place. AT [facility]  Follow-Up: At [facility], you and your health needs are our priority.  As part of our continuing mission to provide you with exceptional heart care, we have created designated Provider Care Teams.  These Care Teams include your primary Cardiologist (physician) and Advanced Practice Providers (APPs -  Physician Assistants and Nurse Practitioners) who all work together to provide you with the care you need, when you need it. You will need a follow up appointment in 6 months.  Please call our office 2 months in advance to schedule this appointment.  You may see Olga Millers MD or one of the following Advanced Practice Providers on your designated Care Team:   Corine Shelter, PA-C Judy Pimple, [place] . Marjie Skiff, PA-C

## 2018-07-17 ENCOUNTER — Ambulatory Visit (HOSPITAL_COMMUNITY)
Admission: RE | Admit: 2018-07-17 | Discharge: 2018-07-17 | Disposition: A | Discharge status: Home / Self Care | Payer: Medicare Other | Source: Ambulatory Visit | Attending: Cardiology | Admitting: Cardiology

## 2018-07-17 ENCOUNTER — Telehealth: Payer: Self-pay | Admitting: *Deleted

## 2018-07-17 DIAGNOSIS — N183 Chronic kidney disease, stage 3 unspecified: Secondary | ICD-10-CM

## 2018-07-17 DIAGNOSIS — E039 Hypothyroidism, unspecified: Secondary | ICD-10-CM

## 2018-07-17 DIAGNOSIS — I480 Paroxysmal atrial fibrillation: Secondary | ICD-10-CM

## 2018-07-17 DIAGNOSIS — J929 Pleural plaque without asbestos: Secondary | ICD-10-CM | POA: Diagnosis not present

## 2018-07-17 LAB — BASIC METABOLIC PANEL
BUN/Creatinine Ratio: 26 — ABNORMAL HIGH (ref 10–24)
BUN: 61 mg/dL — ABNORMAL HIGH (ref 8–27)
CHLORIDE: 96 mmol/L (ref 96–106)
CO2: 24 mmol/L (ref 20–29)
Calcium: 10.3 mg/dL — ABNORMAL HIGH (ref 8.6–10.2)
Creatinine, Ser: 2.32 mg/dL — ABNORMAL HIGH (ref 0.76–1.27)
GFR calc non Af Amer: 26 mL/min/{1.73_m2} — ABNORMAL LOW (ref >59)
GFR, EST AFRICAN AMERICAN: 30 mL/min/{1.73_m2} — AB (ref >59)
GLUCOSE: 90 mg/dL (ref 65–99)
Potassium: 4.7 mmol/L (ref 3.5–5.2)
SODIUM: 136 mmol/L (ref 134–144)

## 2018-07-17 LAB — HEPATIC FUNCTION PANEL
ALBUMIN: 4.8 g/dL — AB (ref 3.7–4.7)
ALT: 23 IU/L (ref 0–44)
AST: 23 IU/L (ref 0–40)
Alkaline Phosphatase: 63 IU/L (ref 39–117)
Bilirubin Total: 0.4 mg/dL (ref 0.0–1.2)
Bilirubin, Direct: 0.13 mg/dL (ref 0.00–0.40)
Total Protein: 7.4 g/dL (ref 6.0–8.5)

## 2018-07-17 LAB — CBC
Hematocrit: 37.5 % (ref 37.5–51.0)
Hemoglobin: 12.9 g/dL — ABNORMAL LOW (ref 13.0–17.7)
MCH: 31.1 pg (ref 26.6–33.0)
MCHC: 34.4 g/dL (ref 31.5–35.7)
MCV: 90 fL (ref 79–97)
PLATELETS: 196 10*3/uL (ref 150–450)
RBC: 4.15 x10E6/uL (ref 4.14–5.80)
RDW: 12.4 % (ref 11.6–15.4)
WBC: 11.3 10*3/uL — ABNORMAL HIGH (ref 3.4–10.8)

## 2018-07-17 LAB — TSH: TSH: 5.35 u[IU]/mL — ABNORMAL HIGH (ref 0.450–4.500)

## 2018-07-17 MED ORDER — LEVOTHYROXINE SODIUM 125 MCG PO TABS: 125.0000 ug | ORAL_TABLET | Freq: Every day | ORAL | 3 refills | Status: DC

## 2018-07-17 NOTE — Telephone Encounter (Signed)
Spoke with pt wife, aware of medication changes and need for repeat lab work. New script sent to the pharmacy for synthroid and Lab orders mailed to the pt

## 2018-07-17 NOTE — Telephone Encounter (Signed)
-----   Message from [name], MD sent at 07/17/2018  7:46 AM EST ----- Hold Lasix for 3 days and then resume at 40 mg daily.  Repeat bmet 1 week.  Increase Synthroid to 125 mcg daily.  TSH 12 weeks. Charles [name]

## 2018-07-23 ENCOUNTER — Encounter: Payer: Self-pay | Admitting: Cardiology

## 2018-07-23 DIAGNOSIS — H40013 Open angle with borderline findings, low risk, bilateral: Secondary | ICD-10-CM | POA: Diagnosis not present

## 2018-07-23 DIAGNOSIS — H35371 Puckering of macula, right eye: Secondary | ICD-10-CM | POA: Diagnosis not present

## 2018-07-23 DIAGNOSIS — H26491 Other secondary cataract, right eye: Secondary | ICD-10-CM | POA: Diagnosis not present

## 2018-07-23 DIAGNOSIS — H26492 Other secondary cataract, left eye: Secondary | ICD-10-CM | POA: Diagnosis not present

## 2018-07-25 DIAGNOSIS — N183 Chronic kidney disease, stage 3 (moderate): Secondary | ICD-10-CM | POA: Diagnosis not present

## 2018-07-26 LAB — BASIC METABOLIC PANEL
BUN/Creatinine Ratio: 21 (ref 10–24)
BUN: 39 mg/dL — ABNORMAL HIGH (ref 8–27)
CO2: 23 mmol/L (ref 20–29)
Calcium: 10.2 mg/dL (ref 8.6–10.2)
Chloride: 96 mmol/L (ref 96–106)
Creatinine, Ser: 1.9 mg/dL — ABNORMAL HIGH (ref 0.76–1.27)
GFR calc Af Amer: 38 mL/min/{1.73_m2} — ABNORMAL LOW (ref >59)
GFR calc non Af Amer: 33 mL/min/{1.73_m2} — ABNORMAL LOW (ref >59)
Glucose: 113 mg/dL — ABNORMAL HIGH (ref 65–99)
POTASSIUM: 5.1 mmol/L (ref 3.5–5.2)
Sodium: 136 mmol/L (ref 134–144)

## 2018-08-06 DIAGNOSIS — H26491 Other secondary cataract, right eye: Secondary | ICD-10-CM | POA: Diagnosis not present

## 2018-10-09 DIAGNOSIS — E039 Hypothyroidism, unspecified: Secondary | ICD-10-CM | POA: Diagnosis not present

## 2018-10-10 LAB — TSH: TSH: 1.89 u[IU]/mL (ref 0.450–4.500)

## 2018-10-22 ENCOUNTER — Other Ambulatory Visit: Payer: Self-pay | Admitting: Adult Health

## 2018-11-07 ENCOUNTER — Encounter: Payer: Self-pay | Admitting: Podiatry

## 2018-11-07 ENCOUNTER — Ambulatory Visit: Payer: Medicare Other | Admitting: Podiatry

## 2018-11-07 ENCOUNTER — Other Ambulatory Visit: Payer: Self-pay

## 2018-11-07 ENCOUNTER — Ambulatory Visit (INDEPENDENT_AMBULATORY_CARE_PROVIDER_SITE_OTHER): Payer: Medicare Other

## 2018-11-07 DIAGNOSIS — L03119 Cellulitis of unspecified part of limb: Secondary | ICD-10-CM

## 2018-11-07 DIAGNOSIS — M2012 Hallux valgus (acquired), left foot: Secondary | ICD-10-CM

## 2018-11-07 DIAGNOSIS — L02619 Cutaneous abscess of unspecified foot: Secondary | ICD-10-CM | POA: Diagnosis not present

## 2018-11-07 MED ORDER — DOXYCYCLINE HYCLATE 100 MG PO TABS: 100.0000 mg | ORAL_TABLET | Freq: Two times a day (BID) | ORAL | 0 refills | Status: DC

## 2018-11-07 NOTE — Progress Notes (Signed)
Subjective:  Patient ID: Charles [name], male    DOB: April 12, 1941,  MRN: [id number] HPI Chief Complaint  Patient presents with  . Foot Pain    L-medial of great toe; "bunion pain; last wed. it was red, swollen all across top of foot and very painful"  . Callouses    L-lateral of 2nd toe; "have corn needs shaving; have AFIB and on blood thinner"    78 y.o. male presents with the above complaint.   ROS: Denies fever chills nausea vomiting muscle aches pains calf pain back pain chest pain shortness of breath.  Past Medical History:  Diagnosis Date  . Arthritis   . Atrial dilatation    mild biatrial dilitation  . Bruises easily   . CHF (congestive heart failure) (HCC)   . CKD (chronic kidney disease), stage III (HCC)   . Coronary artery disease   . Dysrhythmia    atrial fibrillation  . GERD (gastroesophageal reflux disease)   . H/O hiatal hernia   . Hypercholesterolemia   . Hyperglycemia   . Hypertension   . Hypothyroidism   . Mitral regurgitation    a. Mild by echo in 01/2014.  Marland Kitchen Nonischemic cardiomyopathy (HCC)    a. presumed to be tachycardia mediated. Varying EFs over the years from 35-40% to normal and back.  . Paroxysmal atrial fibrillation (HCC)    a. H/o difficult to control. b. s/p PVI ablation 12-03-2012 by [name] with recurrence afterwards, requiring period of amiodarone.  . Peripheral edema   . Sleep apnea    USES C PAP  . Tear of medial meniscus of left knee   . Tremor    Past Surgical History:  Procedure Laterality Date  . ATRIAL FIBRILLATION ABLATION N/A 12/03/2012   Procedure: ATRIAL FIBRILLATION ABLATION;  Surgeon: Hillis Range, MD;  Location: [facility] CATH LAB;  Service: Cardiovascular;  Laterality: N/A;  . ATRIAL FIBRILLATION ABLATION     PVI by [name]  . CARDIAC CATHETERIZATION    . CARDIOVERSION N/A 09/04/2012   Procedure: CARDIOVERSION;  Surgeon: [name], MD;  Location: [facility] ENDOSCOPY;  Service: Cardiovascular;  Laterality: N/A;  .  CARDIOVERSION N/A 02/11/2013   Procedure: CARDIOVERSION;  Surgeon: [name], MD;  Location: [facility] ENDOSCOPY;  Service: Cardiovascular;  Laterality: N/A;  . CARDIOVERSION N/A 12/07/2015   Procedure: CARDIOVERSION;  Surgeon: Chrystie Nose, MD;  Location: [facility] ENDOSCOPY;  Service: Cardiovascular;  Laterality: N/A;  . CARDIOVERSION N/A 03/11/2018   Procedure: CARDIOVERSION;  Surgeon: Lars Masson, MD;  Location: [facility] ENDOSCOPY;  Service: Cardiovascular;  Laterality: N/A;  . EYE SURGERY  2015,2016   bilateral cataract surgery with lens implants  . HERNIA REPAIR  03/2005  . JOINT REPLACEMENT  2005   total right hip replacement  . kidney stent  1987, 02/2005  . KIDNEY STONE SURGERY     2 stents placed  . KNEE ARTHROSCOPY WITH DRILLING/MICROFRACTURE Left 11/20/2013   Procedure: KNEE ARTHROSCOPY WITH DRILLING/MICROFRACTURE;  Surgeon: Jacki Cones, MD;  Location: [facility];  Service: Orthopedics;  Laterality: Left;  . KNEE ARTHROSCOPY WITH MEDIAL MENISECTOMY Left 11/20/2013   Procedure: LEFT KNEE ARTHROSCOPY WITH MEDIAL MENISECTOMY;  Surgeon: Jacki Cones, MD;  Location: [facility] ORS;  Service: Orthopedics;  Laterality: Left;  Microfracture of medial femoral chondyle and abrasion chondroplasty of the medial femoral chondyle  . LITHOTRIPSY     x 2  . PICC line placement  2007  . quadrec  1999   LFT leg quad sx  . right  hip surgery  2007   irrigation and debridement from bacteria  . TEE WITHOUT CARDIOVERSION N/A 12/02/2012   Procedure: TRANSESOPHAGEAL ECHOCARDIOGRAM (TEE);  Surgeon: [name], MD;  Location: [facility] ENDOSCOPY;  Service: Cardiovascular;  Laterality: N/A;  . TOTAL HIP ARTHROPLASTY Left 08/12/2015   Procedure: LEFT TOTAL HIP ARTHROPLASTY;  Surgeon: Ranee Gosselin, MD;  Location: [facility] ORS;  Service: Orthopedics;  Laterality: Left;    Current Outpatient Medications:  .  acetaminophen (TYLENOL ARTHRITIS PAIN) 650 MG CR tablet, Take 650 mg by mouth every 8 (eight) hours as needed for pain. ,  Disp: , Rfl:  .  amiodarone (PACERONE) 200 MG tablet, Take 1 tablet (200 mg total) by mouth daily., Disp: 180 tablet, Rfl: 1 .  Ascorbic Acid (VITAMIN C) 500 MG tablet, Take 1,000 mg by mouth 2 (two) times daily. , Disp: , Rfl:  .  Cholecalciferol (VITAMIN D) 2000 UNITS CAPS, Take 2,000 Units by mouth 2 (two) times daily. , Disp: , Rfl:  .  Coenzyme Q10 (COQ10 PO), Take 100 mg by mouth every morning. , Disp: , Rfl:  .  edoxaban ([name]) 60 MG TABS tablet, Take 30 mg by mouth daily. (Patient taking differently: Take 30 mg by mouth every evening. ), Disp: 90 tablet, Rfl: 3 .  enalapril (VASOTEC) 20 MG tablet, TAKE ONE-HALF TABLET BY MOUTH ONCE DAILY IN THE EVENING, Disp: 135 tablet, Rfl: 3 .  furosemide (LASIX) 40 MG tablet, TAKE ONE & ONE-HALF TABLETS BY MOUTH ONCE DAILY IN THE MORNING, Disp: 135 tablet, Rfl: 3 .  guaiFENesin (MUCINEX) 600 MG 12 hr tablet, Take 600 mg by mouth 2 (two) times daily as needed for congestion. Reported on 12/14/2015, Disp: , Rfl:  .  IRON PO, Take 1 tablet by mouth daily at 12 noon. , Disp: , Rfl:  .  levothyroxine (SYNTHROID, LEVOTHROID) 125 MCG tablet, Take 1 tablet (125 mcg total) by mouth daily before breakfast., Disp: 90 tablet, Rfl: 3 .  magnesium oxide (MAG-OX) 400 MG tablet, Take 400 mg by mouth daily at 12 noon., Disp: , Rfl:  .  metoprolol succinate (TOPROL-XL) 25 MG 24 hr tablet, Take 1 tablet (25 mg total) by mouth at bedtime., Disp: 90 tablet, Rfl: 3 .  Misc Natural Products (OSTEO BI-FLEX JOINT SHIELD PO), Take 2 tablets by mouth every morning. , Disp: , Rfl:  .  Multiple Vitamin (MULTIVITAMIN) tablet, Take 1 tablet by mouth daily at 12 noon. , Disp: , Rfl:  .  Omega-3 Fatty Acids (FISH OIL) 1000 MG CAPS, Take 1,000 mg by mouth 2 (two) times daily. , Disp: , Rfl:  .  omeprazole (PRILOSEC) 40 MG capsule, TAKE ONE CAPSULE BY MOUTH DAILY, Disp: 90 capsule, Rfl: 0 .  polyvinyl alcohol (LIQUIFILM TEARS) 1.4 % ophthalmic solution, Place 1 drop into both eyes daily  as needed for dry eyes., Disp: , Rfl:  .  zinc gluconate 50 MG tablet, Take 50 mg by mouth daily at 12 noon. , Disp: , Rfl:  .  doxycycline (VIBRA-TABS) 100 MG tablet, Take 1 tablet (100 mg total) by mouth 2 (two) times daily., Disp: 20 tablet, Rfl: 0  Allergies  Allergen Reactions  . Ceftriaxone Sodium Rash    Rocephin  . Clindamycin/Lincomycin Itching and Rash    Hoarse voice  . Diltiazem Hcl Rash    Cardizem  . Penicillins Rash    Has patient had a PCN reaction causing immediate rash, facial/tongue/throat swelling, SOB or lightheadedness with hypotension: Unknown Has patient had a  PCN reaction causing severe rash involving mucus membranes or skin necrosis: Unknown Has patient had a PCN reaction that required hospitalization: No Has patient had a PCN reaction occurring within the last 10 years: Yes If all of the above answers are "NO", then may proceed with Cephalosporin use.    Review of Systems Objective:  There were no vitals filed for this visit.  General: Well developed, nourished, in no acute distress, alert and oriented x3   Dermatological: Skin is warm, dry and supple bilateral. Nails x 10 are well maintained; remaining integument appears unremarkable at this time. There are no open sores, no preulcerative lesions, no rash or signs of infection present.  Mild edema and erythema extending to the level of the midfoot along the medial aspect of the first metatarsophalangeal joint area this appears to be cellulitic in nature it is warm to the touch there is no purulence no malodor but there was a small abrasion to the medial aspect of the first metatarsophalangeal joint there is gone on to heal which may have been an open wound.  Vascular: Dorsalis Pedis artery and Posterior Tibial artery pedal pulses are 2/4 bilateral with immedate capillary fill time. Pedal hair growth present. No varicosities and no lower extremity edema present bilateral.   Neruologic: Grossly intact via light  touch bilateral. Vibratory intact via tuning fork bilateral. Protective threshold with Semmes Wienstein monofilament intact to all pedal sites bilateral. Patellar and Achilles deep tendon reflexes 2+ bilateral. No Babinski or clonus noted bilateral.   Musculoskeletal: No gross boney pedal deformities bilateral. No pain, crepitus, or limitation noted with foot and ankle range of motion bilateral. Muscular strength 5/5 in all groups tested bilateral.  Severe hallux abductovalgus deformity of the left foot with encroachment of the hallux on the second and third toes resulting in irritation between the second and third toes.  Gait: Unassisted, Nonantalgic.    Radiographs:  Radiographs taken today demonstrate severe bunion deformities with osteoarthritis of the first metatarsal phalangeal joint.  Hammertoe deformity and compression syndrome of #2 and #3 of the left foot.  Assessment & Plan:   Assessment: Cellulitis dorsal medial aspect of the first metatarsal phalangeal joint.  Hammertoe deformity with bunions.  Plan: Placed padding about the second toe with a silicone digital pad.  I also wrote a prescription for doxycycline 1 to follow-up with him in 1 to 2 weeks.  Should the redness along the dorsal aspect of the foot worsen he is to notify us immediately.     Manly Nestle T. [place], [place]

## 2018-11-26 ENCOUNTER — Other Ambulatory Visit: Payer: Self-pay | Admitting: Cardiology

## 2018-11-28 ENCOUNTER — Ambulatory Visit (INDEPENDENT_AMBULATORY_CARE_PROVIDER_SITE_OTHER): Payer: Medicare Other | Admitting: Orthotics

## 2018-11-28 ENCOUNTER — Encounter: Payer: Self-pay | Admitting: Podiatry

## 2018-11-28 ENCOUNTER — Other Ambulatory Visit: Payer: Self-pay

## 2018-11-28 ENCOUNTER — Ambulatory Visit: Payer: Medicare Other | Admitting: Podiatry

## 2018-11-28 DIAGNOSIS — M2012 Hallux valgus (acquired), left foot: Secondary | ICD-10-CM

## 2018-11-28 DIAGNOSIS — L02619 Cutaneous abscess of unspecified foot: Secondary | ICD-10-CM | POA: Diagnosis not present

## 2018-11-28 DIAGNOSIS — M79671 Pain in right foot: Secondary | ICD-10-CM | POA: Diagnosis not present

## 2018-11-28 DIAGNOSIS — L03119 Cellulitis of unspecified part of limb: Secondary | ICD-10-CM | POA: Diagnosis not present

## 2018-11-28 NOTE — Progress Notes (Signed)
Patient came into today to be cast for Custom Foot Orthotics. Upon recommendation of Dr. Al Corpus Patient presents with severe HAV, callus 1/5 mets Goals are offloading 1s mpj, cushioning thin fat pad under 1/5 Plan vendor Richei

## 2018-11-28 NOTE — Progress Notes (Signed)
Follow-up cellulitis forefoot left.  He states that he is doing great he has no problems with it whatsoever feels 100% better.  Objective: Vital signs stable he is alert oriented x3 there is no erythema just mild edema about the forefoot no cellulitis drainage or odor.  He still has severe hallux valgus deformity tailor's bunion deformity resulting in compression of the middle 3 toes and irritation lateral aspect of the DIPJ second digit left foot.  There is no erythema there is no purulence no malodor some tenderness is present.  No ulceration.  Assessment: Well-healing cellulitis and ulcerative lesion left.  Metatarsalgia resolving.  Plan: At this point he was sent to [facility] for orthotics and I will follow-up with him as needed.

## 2018-12-12 ENCOUNTER — Ambulatory Visit: Payer: Medicare Other | Admitting: Orthotics

## 2018-12-12 ENCOUNTER — Other Ambulatory Visit: Payer: Self-pay

## 2018-12-12 DIAGNOSIS — L02619 Cutaneous abscess of unspecified foot: Secondary | ICD-10-CM

## 2018-12-12 DIAGNOSIS — L03119 Cellulitis of unspecified part of limb: Secondary | ICD-10-CM

## 2018-12-12 DIAGNOSIS — M2012 Hallux valgus (acquired), left foot: Secondary | ICD-10-CM

## 2018-12-12 NOTE — Progress Notes (Signed)
Patient came in today to pick up custom made foot orthotics.  The goals were accomplished and the patient reported no dissatisfaction with said orthotics.  Patient was advised of breakin period and how to report any issues. 

## 2019-02-03 ENCOUNTER — Other Ambulatory Visit: Payer: Self-pay | Admitting: Adult Health

## 2019-02-03 ENCOUNTER — Other Ambulatory Visit: Payer: Self-pay | Admitting: Cardiology

## 2019-02-03 DIAGNOSIS — I4891 Unspecified atrial fibrillation: Secondary | ICD-10-CM

## 2019-02-03 DIAGNOSIS — I4819 Other persistent atrial fibrillation: Secondary | ICD-10-CM

## 2019-02-09 ENCOUNTER — Other Ambulatory Visit: Payer: Self-pay | Admitting: Cardiology

## 2019-02-18 ENCOUNTER — Ambulatory Visit: Payer: Medicare Other | Admitting: Podiatry

## 2019-02-25 NOTE — Progress Notes (Signed)
HPI: FU atrial fibrillation.  He has a past history of paroxysmal atrial fibrillation and tachycardia mediated cardiomyopathy. He underwent mapping and ablation of atrial fib in 11/2012 by Dr. Johney FrameAllred. Carotid dopplers April 2017 normal. At previousoffice visit he wanted to try to discontinue amiodarone. However he contacted the office and felt to be back in atrial fibrillation. Amiodarone was resumed. Patient had repeat cardioversion March 11, 2018.  Last echocardiogram September 2019 showed normal LV function, moderate diastolic dysfunction, mild to moderate mitral regurgitation, biatrial enlargement and moderate tricuspid regurgitation. Since last seen,patient denies dyspnea, chest pain, palpitations, syncope or bleeding.  Some fatigue.  Current Outpatient Medications  Medication Sig Dispense Refill  . amiodarone (PACERONE) 200 MG tablet TAKE ONE TABLET BY MOUTH TWICE A DAY (Patient taking differently: Take 200 mg by mouth daily. ) 180 tablet 0  . Ascorbic Acid (VITAMIN C) 500 MG tablet Take 1,000 mg by mouth 2 (two) times daily.     . Cholecalciferol (VITAMIN D) 2000 UNITS CAPS Take 2,000 Units by mouth 2 (two) times daily.     . Coenzyme Q10 (COQ10 PO) Take 100 mg by mouth every morning.     . enalapril (VASOTEC) 20 MG tablet TAKE ONE-HALF TABLET BY MOUTH EVERY EVENING 45 tablet 0  . furosemide (LASIX) 40 MG tablet TAKE ONE AND ONE-HALF (1  1/2) TABLET BY MOUTH EVERY MORNING (Patient taking differently: Take 40 mg by mouth daily. ) 135 tablet 2  . IRON PO Take 1 tablet by mouth daily at 12 noon.     Marland Kitchen. levothyroxine (SYNTHROID, LEVOTHROID) 125 MCG tablet Take 1 tablet (125 mcg total) by mouth daily before breakfast. 90 tablet 3  . magnesium oxide (MAG-OX) 400 MG tablet Take 400 mg by mouth daily at 12 noon.    . metoprolol succinate (TOPROL-XL) 25 MG 24 hr tablet Take 1 tablet (25 mg total) by mouth at bedtime. 90 tablet 3  . Misc Natural Products (OSTEO BI-FLEX JOINT SHIELD  PO) Take 2 tablets by mouth every morning.     . Multiple Vitamin (MULTIVITAMIN) tablet Take 1 tablet by mouth daily at 12 noon.     . Omega-3 Fatty Acids (FISH OIL) 1000 MG CAPS Take 1,000 mg by mouth 2 (two) times daily.     Marland Kitchen. omeprazole (PRILOSEC) 40 MG capsule TAKE ONE CAPSULE BY MOUTH DAILY (Patient taking differently: 20 mg. ) 90 capsule 0  . polyvinyl alcohol (LIQUIFILM TEARS) 1.4 % ophthalmic solution Place 1 drop into both eyes daily as needed for dry eyes.    Marland Kitchen. [name] 60 MG TABS tablet Take 1 tablet by mouth once daily (Patient taking differently: Take 30 mg by mouth daily. ) 90 tablet 0  . zinc gluconate 50 MG tablet Take 50 mg by mouth daily at 12 noon.      No current facility-administered medications for this visit.      Past Medical History:  Diagnosis Date  . Arthritis   . Atrial dilatation    mild biatrial dilitation  . Bruises easily   . CHF (congestive heart failure) (HCC)   . CKD (chronic kidney disease), stage III (HCC)   . Coronary artery disease   . Dysrhythmia    atrial fibrillation  . GERD (gastroesophageal reflux disease)   . H/O hiatal hernia   . Hypercholesterolemia   . Hyperglycemia   . Hypertension   . Hypothyroidism   . Mitral regurgitation    a. Mild by echo in 01/2014.  .Marland Kitchen  Nonischemic cardiomyopathy ([place])    a. presumed to be tachycardia mediated. Varying EFs over the years from 35-40% to normal and back.  . Paroxysmal atrial fibrillation ([place])    a. H/o difficult to control. b. s/p PVI ablation 12-03-2012 by Dr Rayann Heman with recurrence afterwards, requiring period of amiodarone.  . Peripheral edema   . Sleep apnea    USES C PAP  . Tear of medial meniscus of left knee   . Tremor     Past Surgical History:  Procedure Laterality Date  . ATRIAL FIBRILLATION ABLATION N/A 12/03/2012   Procedure: ATRIAL FIBRILLATION ABLATION;  Surgeon: Thompson Grayer, MD;  Location: [facility] CATH LAB;  Service: Cardiovascular;  Laterality: N/A;  . ATRIAL FIBRILLATION  ABLATION     PVI by Dr Rayann Heman  . CARDIAC CATHETERIZATION    . CARDIOVERSION N/A 09/04/2012   Procedure: CARDIOVERSION;  Surgeon: Darlin Coco, MD;  Location: [facility] ENDOSCOPY;  Service: Cardiovascular;  Laterality: N/A;  . CARDIOVERSION N/A 02/11/2013   Procedure: CARDIOVERSION;  Surgeon: Darlin Coco, MD;  Location: [place];  Service: Cardiovascular;  Laterality: N/A;  . CARDIOVERSION N/A 12/07/2015   Procedure: CARDIOVERSION;  Surgeon: Pixie Casino, MD;  Location: [facility] ENDOSCOPY;  Service: Cardiovascular;  Laterality: N/A;  . CARDIOVERSION N/A 03/11/2018   Procedure: CARDIOVERSION;  Surgeon: Dorothy Spark, MD;  Location: [facility] ENDOSCOPY;  Service: Cardiovascular;  Laterality: N/A;  . EYE SURGERY  2015,2016   bilateral cataract surgery with lens implants  . HERNIA REPAIR  03/2005  . JOINT REPLACEMENT  2005   total right hip replacement  . kidney stent  1987, 02/2005  . KIDNEY STONE SURGERY     2 stents placed  . KNEE ARTHROSCOPY WITH DRILLING/MICROFRACTURE Left 11/20/2013   Procedure: KNEE ARTHROSCOPY WITH DRILLING/MICROFRACTURE;  Surgeon: Tobi Bastos, MD;  Location: [facility]S;  Service: Orthopedics;  Laterality: Left;  . KNEE ARTHROSCOPY WITH MEDIAL MENISECTOMY Left 11/20/2013   Procedure: LEFT KNEE ARTHROSCOPY WITH MEDIAL MENISECTOMY;  Surgeon: Tobi Bastos, MD;  Location: [facility];  Service: Orthopedics;  Laterality: Left;  Microfracture of medial femoral chondyle and abrasion chondroplasty of the medial femoral chondyle  . LITHOTRIPSY     x 2  . PICC line placement  2007  . [place]   LFT leg quad sx  . right hip surgery  2007   irrigation and debridement from bacteria  . TEE WITHOUT CARDIOVERSION N/A 12/02/2012   Procedure: TRANSESOPHAGEAL ECHOCARDIOGRAM (TEE);  Surgeon: Fay Records, MD;  Location: [place];  Service: Cardiovascular;  Laterality: N/A;  . TOTAL HIP ARTHROPLASTY Left 08/12/2015   Procedure: LEFT TOTAL HIP ARTHROPLASTY;  Surgeon: Latanya Maudlin,  MD;  Location: [facility];  Service: Orthopedics;  Laterality: Left;    Social History   Socioeconomic History  . Marital status: Married    Spouse name: Not on file  . Number of children: Not on file  . Years of education: Not on file  . Highest education level: Not on file  Occupational History  . Occupation: Contractor: RETIRED  Social Needs  . Financial resource strain: Not on file  . Food insecurity    Worry: Not on file    Inability: Not on file  . Transportation needs    Medical: Not on file    Non-medical: Not on file  Tobacco Use  . Smoking status: Never Smoker  . Smokeless tobacco: Never Used  Substance and Sexual Activity  . Alcohol use: No  .  Drug use: No  . Sexual activity: Not on file  Lifestyle  . Physical activity    Days per week: Not on file    Minutes per session: Not on file  . Stress: Not on file  Relationships  . Social Musician on phone: Not on file    Gets together: Not on file    Attends religious service: Not on file    Active member of club or organization: Not on file    Attends meetings of clubs or organizations: Not on file    Relationship status: Not on file  . Intimate partner violence    Fear of current or ex partner: Not on file    Emotionally abused: Not on file    Physically abused: Not on file    Forced sexual activity: Not on file  Other Topics Concern  . Not on file  Social History Narrative   Lives in [place] with spouse.  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   [employer] [facility]:   Has worked extensively in [employer]. Significant asbestos asbestos exposure. No bird or mold exposure.     Family History  Problem Relation Age of Onset  . Cancer Mother 36       CANCER  . Heart disease Father 16       HEART PROBLEMS  . Lupus Brother        "of the skin"    ROS: no fevers or chills, productive cough, hemoptysis, dysphasia, odynophagia,  melena, hematochezia, dysuria, hematuria, rash, seizure activity, orthopnea, PND, pedal edema, claudication. Remaining systems are negative.  Physical Exam: Well-developed well-nourished in no acute distress.  Skin is warm and dry.  HEENT is normal.  Neck is supple.  Chest is clear to auscultation with normal expansion.  Cardiovascular exam is regular rate and rhythm.  Abdominal exam nontender or distended. No masses palpated. Extremities show no edema. neuro grossly intact  ECG-sinus bradycardia at a rate of 50, no ST changes.  Personally reviewed  A/P  1 paroxysmal atrial fibrillation-patient remains in sinus rhythm.  Continue amiodarone at present dose.  Check TSH, liver functions and chest x-ray.  Continue edoxaban.  Check hemoglobin and renal function.  2 mild to moderate mitral regurgitation-plan follow-up echocardiogram September 2021.  3 hypertension-patient's blood pressure is controlled.  Continue present medications and follow-up.  4 history of chronic stage III renal disease-check potassium and renal function.  Olga Millers, MD

## 2019-02-26 ENCOUNTER — Other Ambulatory Visit: Payer: Self-pay

## 2019-02-26 ENCOUNTER — Other Ambulatory Visit: Payer: Self-pay | Admitting: Cardiology

## 2019-02-26 ENCOUNTER — Encounter: Payer: Self-pay | Admitting: Cardiology

## 2019-02-26 ENCOUNTER — Ambulatory Visit (INDEPENDENT_AMBULATORY_CARE_PROVIDER_SITE_OTHER): Payer: Medicare Other | Admitting: Cardiology

## 2019-02-26 ENCOUNTER — Other Ambulatory Visit: Payer: Self-pay | Admitting: *Deleted

## 2019-02-26 VITALS — BP 124/56 | Temp 97.1°F | Ht 70.0 in | Wt 191.0 lb

## 2019-02-26 DIAGNOSIS — N183 Chronic kidney disease, stage 3 unspecified: Secondary | ICD-10-CM

## 2019-02-26 DIAGNOSIS — I480 Paroxysmal atrial fibrillation: Secondary | ICD-10-CM | POA: Diagnosis not present

## 2019-02-26 DIAGNOSIS — I10 Essential (primary) hypertension: Secondary | ICD-10-CM | POA: Diagnosis not present

## 2019-02-26 DIAGNOSIS — I340 Nonrheumatic mitral (valve) insufficiency: Secondary | ICD-10-CM

## 2019-02-26 LAB — CBC
Hematocrit: 34.1 % — ABNORMAL LOW (ref 37.5–51.0)
Hemoglobin: 11.3 g/dL — ABNORMAL LOW (ref 13.0–17.7)
MCH: 30.9 pg (ref 26.6–33.0)
MCHC: 33.1 g/dL (ref 31.5–35.7)
MCV: 93 fL (ref 79–97)
Platelets: 204 10*3/uL (ref 150–450)
RBC: 3.66 x10E6/uL — ABNORMAL LOW (ref 4.14–5.80)
RDW: 11.7 % (ref 11.6–15.4)
WBC: 8.7 10*3/uL (ref 3.4–10.8)

## 2019-02-26 LAB — COMPREHENSIVE METABOLIC PANEL
ALT: 34 IU/L (ref 0–44)
AST: 30 IU/L (ref 0–40)
Albumin/Globulin Ratio: 1.6 (ref 1.2–2.2)
Albumin: 4.3 g/dL (ref 3.7–4.7)
Alkaline Phosphatase: 63 IU/L (ref 39–117)
BUN/Creatinine Ratio: 19 (ref 10–24)
BUN: 34 mg/dL — ABNORMAL HIGH (ref 8–27)
Bilirubin Total: 0.4 mg/dL (ref 0.0–1.2)
CO2: 25 mmol/L (ref 20–29)
Calcium: 10 mg/dL (ref 8.6–10.2)
Chloride: 97 mmol/L (ref 96–106)
Creatinine, Ser: 1.75 mg/dL — ABNORMAL HIGH (ref 0.76–1.27)
GFR calc Af Amer: 42 mL/min/{1.73_m2} — ABNORMAL LOW (ref >59)
GFR calc non Af Amer: 36 mL/min/{1.73_m2} — ABNORMAL LOW (ref >59)
Globulin, Total: 2.7 g/dL (ref 1.5–4.5)
Glucose: 99 mg/dL (ref 65–99)
Potassium: 5.1 mmol/L (ref 3.5–5.2)
Sodium: 135 mmol/L (ref 134–144)
Total Protein: 7 g/dL (ref 6.0–8.5)

## 2019-02-26 LAB — TSH: TSH: 2.5 u[IU]/mL (ref 0.450–4.500)

## 2019-02-26 MED ORDER — ENALAPRIL MALEATE 20 MG PO TABS: ORAL_TABLET | ORAL | 3 refills | Status: DC

## 2019-02-26 NOTE — Patient Instructions (Addendum)
Medication Instructions:  NO CHANGE If you need a refill on your cardiac medications before your next appointment, please call your pharmacy.   Lab work: Your physician recommends that you HAVE LAB WORK TODAY If you have labs (blood work) drawn today and your tests are completely normal, you will receive your results only by: Marland Kitchen MyChart Message (if you have MyChart) OR . A paper copy in the mail If you have any lab test that is abnormal or we need to change your treatment, we will call you to review the results.  Testing:  A chest x-ray takes a picture of the organs and structures inside the chest, including the heart, lungs, and blood vessels. This test can show several things, including, whether the heart is enlarges; whether fluid is building up in the lungs; and whether pacemaker / defibrillator leads are still in place. [place]: At [facility], you and your health needs are our priority.  As part of our continuing mission to provide you with exceptional heart care, we have created designated Provider Care Teams.  These Care Teams include your primary Cardiologist (physician) and Advanced Practice Providers (APPs -  Physician Assistants and Nurse Practitioners) who all work together to provide you with the care you need, when you need it. You will need a follow up appointment in 6 months.  Please call our office 2 months in advance to schedule this appointment.  You may see Kirk Ruths MD or one of the following Advanced Practice Providers on your designated Care Team:   Kerin Ransom, PA-C Roby Lofts, [place] . Sande Rives, PA-C

## 2019-03-04 ENCOUNTER — Ambulatory Visit
Admission: RE | Admit: 2019-03-04 | Discharge: 2019-03-04 | Disposition: A | Discharge status: Home / Self Care | Payer: Medicare Other | Source: Ambulatory Visit | Attending: Cardiology | Admitting: Cardiology

## 2019-03-04 ENCOUNTER — Other Ambulatory Visit: Payer: Self-pay

## 2019-03-04 DIAGNOSIS — J929 Pleural plaque without asbestos: Secondary | ICD-10-CM | POA: Diagnosis not present

## 2019-03-04 DIAGNOSIS — I480 Paroxysmal atrial fibrillation: Secondary | ICD-10-CM

## 2019-05-05 ENCOUNTER — Other Ambulatory Visit: Payer: Self-pay | Admitting: Cardiology

## 2019-05-05 DIAGNOSIS — I4819 Other persistent atrial fibrillation: Secondary | ICD-10-CM

## 2019-05-08 DIAGNOSIS — H57813 Brow ptosis, bilateral: Secondary | ICD-10-CM | POA: Diagnosis not present

## 2019-05-08 DIAGNOSIS — H04123 Dry eye syndrome of bilateral lacrimal glands: Secondary | ICD-10-CM | POA: Diagnosis not present

## 2019-05-08 DIAGNOSIS — Z961 Presence of intraocular lens: Secondary | ICD-10-CM | POA: Diagnosis not present

## 2019-05-08 DIAGNOSIS — H40013 Open angle with borderline findings, low risk, bilateral: Secondary | ICD-10-CM | POA: Diagnosis not present

## 2019-06-06 ENCOUNTER — Other Ambulatory Visit: Payer: Self-pay

## 2019-06-06 DIAGNOSIS — I4891 Unspecified atrial fibrillation: Secondary | ICD-10-CM

## 2019-06-09 MED ORDER — EDOXABAN TOSYLATE 60 MG PO TABS: 60.0000 mg | ORAL_TABLET | Freq: Every day | ORAL | 0 refills | Status: DC

## 2019-06-30 ENCOUNTER — Other Ambulatory Visit: Payer: Self-pay | Admitting: Cardiology

## 2019-06-30 DIAGNOSIS — E039 Hypothyroidism, unspecified: Secondary | ICD-10-CM

## 2019-07-01 ENCOUNTER — Other Ambulatory Visit: Payer: Self-pay | Admitting: Cardiology

## 2019-07-14 ENCOUNTER — Other Ambulatory Visit: Payer: Self-pay

## 2019-07-14 ENCOUNTER — Other Ambulatory Visit: Payer: Self-pay | Admitting: Cardiology

## 2019-07-14 DIAGNOSIS — I480 Paroxysmal atrial fibrillation: Secondary | ICD-10-CM

## 2019-07-14 MED ORDER — METOPROLOL SUCCINATE ER 50 MG PO TB24: ORAL_TABLET | ORAL | 1 refills | Status: DC

## 2019-07-16 ENCOUNTER — Other Ambulatory Visit: Payer: Self-pay | Admitting: Cardiology

## 2019-08-05 ENCOUNTER — Encounter: Payer: Self-pay | Admitting: Neurology

## 2019-08-05 ENCOUNTER — Encounter: Payer: Self-pay | Admitting: Family Medicine

## 2019-08-05 ENCOUNTER — Ambulatory Visit: Payer: Medicare Other | Admitting: Family Medicine

## 2019-08-05 ENCOUNTER — Other Ambulatory Visit: Payer: Self-pay

## 2019-08-05 VITALS — BP 171/72 | HR 53

## 2019-08-05 DIAGNOSIS — I10 Essential (primary) hypertension: Secondary | ICD-10-CM

## 2019-08-05 DIAGNOSIS — E039 Hypothyroidism, unspecified: Secondary | ICD-10-CM

## 2019-08-05 DIAGNOSIS — G4731 Primary central sleep apnea: Secondary | ICD-10-CM

## 2019-08-05 DIAGNOSIS — I4819 Other persistent atrial fibrillation: Secondary | ICD-10-CM | POA: Diagnosis not present

## 2019-08-05 DIAGNOSIS — R251 Tremor, unspecified: Secondary | ICD-10-CM

## 2019-08-05 DIAGNOSIS — J849 Interstitial pulmonary disease, unspecified: Secondary | ICD-10-CM

## 2019-08-05 DIAGNOSIS — I509 Heart failure, unspecified: Secondary | ICD-10-CM | POA: Diagnosis not present

## 2019-08-05 DIAGNOSIS — K219 Gastro-esophageal reflux disease without esophagitis: Secondary | ICD-10-CM

## 2019-08-05 DIAGNOSIS — N183 Chronic kidney disease, stage 3 unspecified: Secondary | ICD-10-CM

## 2019-08-05 NOTE — Progress Notes (Signed)
Office Visit Note   Patient: Charles [name]           Date of Birth: [birth year]           MRN: [id number] Visit Date: 08/05/2019 Requested by: Leanora Ivanoff., MD [address] [place],  [place] [address] PCP: Lavada Mesi, MD  Subjective: Chief Complaint  Patient presents with  . establish primary care    HPI: He is here to establish care.  He has not had a PCP in quite a while.  He mainly goes to his cardiologist.  He has a history of atrial fibrillation, CHF, cardiomyopathy and hypertension.  He has had 7 cardioversions.  He is currently managed pretty well with medication but amiodarone was causing increased tremors so it was recently decreased and unfortunately he has had more trouble with atrial fibrillation.  He is currently asymptomatic.  He has hypothyroidism controlled with Synthroid.  He has a history of sleep apnea.  He has chronic kidney disease stage III which has been stable for many years.  He takes omeprazole for GERD, every other day.  He has a history of interstitial lung disease related to asbestos exposure.  He is asymptomatic from that standpoint.  He has had a tremor for most of his adult life.  It seems to be getting worse.  His mother had Parkinson's disease.  Lately he is having some jaw symptoms.  He would like to make sure he does not have Parkinson's.               ROS:   All other systems were reviewed and are negative.  Objective: Vital Signs: BP (!) 171/72   Pulse (!) 53   Physical Exam:  General:  Alert and oriented, in no acute distress. Pulm:  Breathing unlabored. Psy:  Normal mood, congruent affect. Skin: No suspicious lesions. HEENT:  No thyromegaly or nodules.  2+ carotid pulses without bruits. CV: Regular rate and rhythm without murmurs, rubs, or gallops.  No peripheral edema.  2+ radial and posterior tibial pulses. Lungs: Overall clear to auscultation throughout with no wheezing or areas of consolidation.  A  few crackling sounds at the bases. Neuro: He has a resting tremor of his hands.  He walks with a normal gait.     Imaging: None today  Assessment & Plan: 1.  Retention, suboptimally controlled -No changes today, he will continue to monitor.  He is working with his cardiologist on this.  2.  Atrial fibrillation, seems to be in sinus rhythm today.  3.  Tremor - Referral to Dr. Arbutus Leas for consult.  4.  Chronic kidney disease, stable.  5.  Health maintenance -Return in the summer for a wellness exam with labs.     Procedures: No procedures performed  No notes on file     PMFS History: Patient Active Problem List   Diagnosis Date Noted  . ILD (interstitial lung disease) (HCC) 08/02/2016  . Asbestos exposure 06/08/2016  . Persistent atrial fibrillation (HCC)   . Bruit 10/01/2015  . H/O total hip arthroplasty 08/12/2015  . Hyperglycemia   . CKD (chronic kidney disease), stage III   . Osteoarthritis of left knee 11/20/2013  . Complex tear of medial meniscus of left knee as current injury 11/20/2013  . Encounter for therapeutic drug monitoring 09/15/2013  . Complex sleep apnea syndrome 11/21/2012  . Nonischemic cardiomyopathy (HCC) 11/09/2012  . Snoring 11/09/2012  . Hypothyroidism 08/26/2012  . Long term (current) use of  anticoagulants 08/01/2012  . Tremor 10/24/2011  . Atrial fibrillation ([place])   . Peripheral edema   . Swelling   . Mitral regurgitation   . Orthopnea   . Dyspnea   . CHF (congestive heart failure) ([place])   . Hypertension   . Hypercholesterolemia   . Chronic renal insufficiency   . HYPERTENSION 11/07/2006  . GERD 11/07/2006  . ABDOMINAL WALL HERNIA 11/07/2006  . OSTEOARTHROSIS NOS, UNSPECIFIED SITE 11/07/2006  . ABRASION, LOWER EXTREMITY W/INFECTION 11/07/2006  . NEPHROLITHIASIS, HX OF 11/07/2006  . HIP REPLACEMENT, TOTAL, HX OF 11/07/2006   Past Medical History:  Diagnosis Date  . Arthritis   . Atrial dilatation    mild biatrial dilitation    . Bruises easily   . CHF (congestive heart failure) ([place])   . CKD (chronic kidney disease), stage III ([place])   . Coronary artery disease   . Dysrhythmia    atrial fibrillation  . GERD (gastroesophageal reflux disease)   . H/O hiatal hernia   . Hypercholesterolemia   . Hyperglycemia   . Hypertension   . Hypothyroidism   . Mitral regurgitation    a. Mild by echo in 01/2014.  Marland Kitchen Nonischemic cardiomyopathy ([place])    a. presumed to be tachycardia mediated. Varying EFs over the years from 35-40% to normal and back.  . Paroxysmal atrial fibrillation ([place])    a. H/o difficult to control. b. s/p PVI ablation 12-03-2012 by Dr Rayann Heman with recurrence afterwards, requiring period of amiodarone.  . Peripheral edema   . Sleep apnea    USES C PAP  . Tear of medial meniscus of left knee   . Tremor     Family History  Problem Relation Age of Onset  . Cancer Mother 21       CANCER  . Heart disease Father 65       HEART PROBLEMS  . Lupus Brother        "of the skin"    Past Surgical History:  Procedure Laterality Date  . ATRIAL FIBRILLATION ABLATION N/A 12/03/2012   Procedure: ATRIAL FIBRILLATION ABLATION;  Surgeon: Thompson Grayer, MD;  Location[facility] CATH LAB;  Service: Cardiovascular;  Laterality: N/A;  . ATRIAL FIBRILLATION ABLATION     PVI by Dr Rayann Heman  . CARDIAC CATHETERIZATION    . CARDIOVERSION N/A 09/04/2012   Procedure: CARDIOVERSION;  Surgeon: Darlin Coco, MD;  Location: [facility] ENDOSCOPY;  Service: Cardiovascular;  Laterality: N/A;  . CARDIOVERSION N/A 02/11/2013   Procedure: CARDIOVERSION;  Surgeon: Darlin Coco, MD;  Location: [place];  Service: Cardiovascular;  Laterality: N/A;  . CARDIOVERSION N/A 12/07/2015   Procedure: CARDIOVERSION;  Surgeon: Pixie Casino, MD;  Location: [facility] ENDOSCOPY;  Service: Cardiovascular;  Laterality: N/A;  . CARDIOVERSION N/A 03/11/2018   Procedure: CARDIOVERSION;  Surgeon: Dorothy Spark, MD;  Location: [facility] ENDOSCOPY;  Service: Cardiovascular;   Laterality: N/A;  . EYE SURGERY  2015,2016   bilateral cataract surgery with lens implants  . HERNIA REPAIR  03/2005  . JOINT REPLACEMENT  2005   total right hip replacement  . kidney stent  1987, 02/2005  . KIDNEY STONE SURGERY     2 stents placed  . KNEE ARTHROSCOPY WITH DRILLING/MICROFRACTURE Left 11/20/2013   Procedure: KNEE ARTHROSCOPY WITH DRILLING/MICROFRACTURE;  Surgeon: Tobi Bastos, MD;  Location: [facility];  Service: Orthopedics;  Laterality: Left;  . KNEE ARTHROSCOPY WITH MEDIAL MENISECTOMY Left 11/20/2013   Procedure: LEFT KNEE ARTHROSCOPY WITH MEDIAL MENISECTOMY;  Surgeon: Tobi Bastos, MD;  Location:  [facility];  Service: Orthopedics;  Laterality: Left;  Microfracture of medial femoral chondyle and abrasion chondroplasty of the medial femoral chondyle  . LITHOTRIPSY     x 2  . PICC line placement  2007  . quadrec  1999   LFT leg quad sx  . right hip surgery  2007   irrigation and debridement from bacteria  . TEE WITHOUT CARDIOVERSION N/A 12/02/2012   Procedure: TRANSESOPHAGEAL ECHOCARDIOGRAM (TEE);  Surgeon: [name], MD;  Location: [facility] ENDOSCOPY;  Service: Cardiovascular;  Laterality: N/A;  . TOTAL HIP ARTHROPLASTY Left 08/12/2015   Procedure: LEFT TOTAL HIP ARTHROPLASTY;  Surgeon: Ranee Gosselin, MD;  Location: [facility];  Service: Orthopedics;  Laterality: Left;   Social History   Occupational History  . Occupation: Recruitment consultant: RETIRED  Tobacco Use  . Smoking status: Never Smoker  . Smokeless tobacco: Never Used  Substance and Sexual Activity  . Alcohol use: No  . Drug use: No  . Sexual activity: Not on file

## 2019-08-21 NOTE — Progress Notes (Signed)
HPI: FU atrial fibrillation. He has a past history of paroxysmal atrial fibrillation and tachycardia mediated cardiomyopathy. He underwent mapping and ablation of atrial fib in 11/2012 by Dr. [name]. Carotid dopplers April 2017 normal. At previousoffice visit he wanted to try to discontinue amiodarone. However he contacted the office and felt to be back in atrial fibrillation. Amiodarone was resumed. Patient had repeat cardioversion March 11, 2018.Last echocardiogram September 2019 showed normal LV function, moderate diastolic dysfunction, mild to moderate mitral regurgitation, biatrial enlargement and moderate tricuspid regurgitation. Since last seen,he denies dyspnea, chest pain, palpitations or syncope.  Current Outpatient Medications  Medication Sig Dispense Refill  . acetaminophen (ACETAMINOPHEN 8 HOUR) 650 MG CR tablet Take 650 mg by mouth daily as needed for pain.    Marland Kitchen amiodarone (PACERONE) 200 MG tablet Take 1 tablet (200 mg total) by mouth daily. 90 tablet 1  . Ascorbic Acid (VITAMIN C) 500 MG tablet Take 1,000 mg by mouth 2 (two) times daily.     . Cholecalciferol (VITAMIN D) 2000 UNITS CAPS Take 2,000 Units by mouth 2 (two) times daily.     . Coenzyme Q10 (COQ10 PO) Take 100 mg by mouth every morning.     . edoxaban ([name]) 60 MG TABS tablet Take 60 mg by mouth daily. 90 tablet 0  . enalapril (VASOTEC) 20 MG tablet TAKE ONE-HALF TABLET BY MOUTH EVERY EVENING 45 tablet 3  . furosemide (LASIX) 40 MG tablet TAKE ONE AND ONE-HALF (1  1/2) TABLET BY MOUTH EVERY MORNING (Patient taking differently: Take 40 mg by mouth daily. ) 135 tablet 2  . IRON PO Take 1 tablet by mouth daily at 12 noon.     Marland Kitchen levothyroxine (SYNTHROID) 125 MCG tablet TAKE 1 TABLET BY MOUTH ONCE DAILY BEFORE BREAKFAST 90 tablet 0  . metoprolol succinate (TOPROL-XL) 50 MG 24 hr tablet TAKE ONE TABLET BY MOUTH EVERY MORNING AND TAKE ONE-HALF TABLET BY MOUTH EVERY EVENING 135 tablet 1  . Misc Natural Products  (OSTEO BI-FLEX JOINT SHIELD PO) Take 2 tablets by mouth every morning.     . Multiple Vitamin (MULTIVITAMIN) tablet Take 1 tablet by mouth daily at 12 noon.     . Omega-3 Fatty Acids (FISH OIL) 1000 MG CAPS Take 1,000 mg by mouth 2 (two) times daily.     Marland Kitchen omeprazole (PRILOSEC) 40 MG capsule TAKE ONE CAPSULE BY MOUTH DAILY 90 capsule 1  . zinc gluconate 50 MG tablet Take 50 mg by mouth daily at 12 noon.     Burman Blacksmith MAGNESIUM CITRATE PO Take by mouth daily.    . polyvinyl alcohol (LIQUIFILM TEARS) 1.4 % ophthalmic solution Place 1 drop into both eyes daily as needed for dry eyes.     No current facility-administered medications for this visit.     Past Medical History:  Diagnosis Date  . Arthritis   . Atrial dilatation    mild biatrial dilitation  . Bruises easily   . CHF (congestive heart failure) (HCC)   . CKD (chronic kidney disease), stage III   . Coronary artery disease   . Dysrhythmia    atrial fibrillation  . GERD (gastroesophageal reflux disease)   . H/O hiatal hernia   . Hypercholesterolemia   . Hyperglycemia   . Hypertension   . Hypothyroidism   . Mitral regurgitation    a. Mild by echo in 01/2014.  Marland Kitchen Nonischemic cardiomyopathy (HCC)    a. presumed to be tachycardia mediated. Varying EFs over the  years from 35-40% to normal and back.  . Paroxysmal atrial fibrillation ([place])    a. H/o difficult to control. b. s/p PVI ablation 12-03-2012 by Dr Rayann Heman with recurrence afterwards, requiring period of amiodarone.  . Peripheral edema   . Sleep apnea    USES C PAP  . Tear of medial meniscus of left knee   . Tremor     Past Surgical History:  Procedure Laterality Date  . ATRIAL FIBRILLATION ABLATION N/A 12/03/2012   Procedure: ATRIAL FIBRILLATION ABLATION;  Surgeon: Thompson Grayer, MD;  Location: [facility] CATH LAB;  Service: Cardiovascular;  Laterality: N/A;  . ATRIAL FIBRILLATION ABLATION     PVI by Dr Rayann Heman  . CARDIAC CATHETERIZATION    . CARDIOVERSION N/A 09/04/2012   Procedure:  CARDIOVERSION;  Surgeon: Darlin Coco, MD;  Location: [facility] ENDOSCOPY;  Service: Cardiovascular;  Laterality: N/A;  . CARDIOVERSION N/A 02/11/2013   Procedure: CARDIOVERSION;  Surgeon: Darlin Coco, MD;  Location: [place];  Service: Cardiovascular;  Laterality: N/A;  . CARDIOVERSION N/A 12/07/2015   Procedure: CARDIOVERSION;  Surgeon: Pixie Casino, MD;  Location: [facility] ENDOSCOPY;  Service: Cardiovascular;  Laterality: N/A;  . CARDIOVERSION N/A 03/11/2018   Procedure: CARDIOVERSION;  Surgeon: Dorothy Spark, MD;  Location: [facility] ENDOSCOPY;  Service: Cardiovascular;  Laterality: N/A;  . EYE SURGERY  2015,2016   bilateral cataract surgery with lens implants  . HERNIA REPAIR  03/2005  . JOINT REPLACEMENT  2005   total right hip replacement  . kidney stent  1987, 02/2005  . KIDNEY STONE SURGERY     2 stents placed  . KNEE ARTHROSCOPY WITH DRILLING/MICROFRACTURE Left 11/20/2013   Procedure: KNEE ARTHROSCOPY WITH DRILLING/MICROFRACTURE;  Surgeon: Tobi Bastos, MD;  Location: [facility];  Service: Orthopedics;  Laterality: Left;  . KNEE ARTHROSCOPY WITH MEDIAL MENISECTOMY Left 11/20/2013   Procedure: LEFT KNEE ARTHROSCOPY WITH MEDIAL MENISECTOMY;  Surgeon: Tobi Bastos, MD;  Location: [facility];  Service: Orthopedics;  Laterality: Left;  Microfracture of medial femoral chondyle and abrasion chondroplasty of the medial femoral chondyle  . LITHOTRIPSY     x 2  . PICC line placement  2007  . [place]   LFT leg quad sx  . right hip surgery  2007   irrigation and debridement from bacteria  . TEE WITHOUT CARDIOVERSION N/A 12/02/2012   Procedure: TRANSESOPHAGEAL ECHOCARDIOGRAM (TEE);  Surgeon: Fay Records, MD;  Location: [place];  Service: Cardiovascular;  Laterality: N/A;  . TOTAL HIP ARTHROPLASTY Left 08/12/2015   Procedure: LEFT TOTAL HIP ARTHROPLASTY;  Surgeon: Latanya Maudlin, MD;  Location: [facility];  Service: Orthopedics;  Laterality: Left;    Social History   Socioeconomic  History  . Marital status: Married    Spouse name: Not on file  . Number of children: Not on file  . Years of education: Not on file  . Highest education level: Not on file  Occupational History  . Occupation: Contractor: RETIRED  Tobacco Use  . Smoking status: Never Smoker  . Smokeless tobacco: Never Used  Substance and Sexual Activity  . Alcohol use: No  . Drug use: No  . Sexual activity: Not on file  Other Topics Concern  . Not on file  Social History Narrative   Lives in [place] with spouse.  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   [facility]:   Has worked extensively in [employer]. Significant  asbestos asbestos exposure. No bird or mold exposure.    Social Determinants of Health   Financial Resource Strain:   . Difficulty of Paying Living Expenses: Not on file  Food Insecurity:   . Worried About Programme researcher, broadcasting/film/video in the Last Year: Not on file  . Ran Out of Food in the Last Year: Not on file  Transportation Needs:   . Lack of Transportation (Medical): Not on file  . Lack of Transportation (Non-Medical): Not on file  Physical Activity:   . Days of Exercise per Week: Not on file  . Minutes of Exercise per Session: Not on file  Stress:   . Feeling of Stress : Not on file  Social Connections:   . Frequency of Communication with Friends and Family: Not on file  . Frequency of Social Gatherings with Friends and Family: Not on file  . Attends Religious Services: Not on file  . Active Member of Clubs or Organizations: Not on file  . Attends Banker Meetings: Not on file  . Marital Status: Not on file  Intimate Partner Violence:   . Fear of Current or Ex-Partner: Not on file  . Emotionally Abused: Not on file  . Physically Abused: Not on file  . Sexually Abused: Not on file    Family History  Problem Relation Age of Onset  . Cancer Mother 42       CANCER  . Heart disease  Father 12       HEART PROBLEMS  . Lupus Brother        "of the skin"    ROS: no fevers or chills, productive cough, hemoptysis, dysphasia, odynophagia, melena, hematochezia, dysuria, hematuria, rash, seizure activity, orthopnea, PND, pedal edema, claudication. Remaining systems are negative.  Physical Exam: Well-developed well-nourished in no acute distress.  Skin is warm and dry.  HEENT is normal.  Neck is supple.  Chest is clear to auscultation with normal expansion.  Cardiovascular exam is regular rate and rhythm.  Abdominal exam nontender or distended. No masses palpated. Extremities show no edema. neuro grossly intact  A/P  1 paroxysmal atrial fibrillation-patient is in sinus rhythm today on exam.  Continue amiodarone.  Continue edoxaban.  Check hemoglobin, renal function, liver functions, TSH and chest x-ray.  2 hypertension-blood pressure controlled.  Continue present medical regimen.  3 history of mild to moderate mitral regurgitation-we will plan follow-up echocardiogram September 2021.  4 chronic stage III kidney disease  Olga Millers, MD

## 2019-08-29 NOTE — Progress Notes (Signed)
Charles [name] was seen today in the movement disorders clinic for neurologic consultation at the request of Hilts, Michael, MD.  The consultation is for the evaluation of tremor.  Medical records made available to me have been reviewed.  Tremor: Yes.     How long has it been going on? had tremor most of adult life, made worse by amiodarone and dose was reduced b/c of it but within a week he had an episode of afib and so he had to go back up on it  At rest or with activation?  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  Yes.  , mother with Parkinsons Disease which is primary reason wanted to be seen  Located where?  Not sure, but R may shake worse than the L but he is R hand dominant  Affected by caffeine: doesn't drink much  Affected by alcohol:  Doesn't drink any  Affected by stress:  unknown  Affected by fatigue:  No.  Spills glass of liquid if full: may or may not  Affects ADL's (tying shoes, brushing teeth, etc):  Has to use 2 hands on the razor when shaving  Tremor inducing meds:  Yes.  , amiodarone  Other Specific Symptoms:  Voice: little weaker Sleep: sleeps well with cpap  Vivid Dreams:  Hard to say  Acting out dreams:  No. Postural symptoms:  Mild sx's  Falls?  No. Bradykinesia symptoms: no bradykinesia noted Loss of smell:  Yes.   Loss of taste:  No. Urinary Incontinence:  No. Difficulty Swallowing:  No. Handwriting, micrographia: No. Depression:  No. Memory changes:  Yes.  , mild short term N/V:  No. Lightheaded:  No.  Syncope: No. Diplopia:  No. Dyskinesia:  No.  Neuroimaging of the brain has not previously been performed.   ALLERGIES:   Allergies  Allergen Reactions  . Ceftriaxone Sodium Rash    Rocephin  . Clindamycin/Lincomycin Itching and Rash    Hoarse voice  . Diltiazem Hcl Rash    Cardizem  . Penicillins Rash    Has patient had a PCN reaction causing  immediate rash, facial/tongue/throat swelling, SOB or lightheadedness with hypotension: Unknown Has patient had a PCN reaction causing severe rash involving mucus membranes or skin necrosis: Unknown Has patient had a PCN reaction that required hospitalization: No Has patient had a PCN reaction occurring within the last 10 years: Yes If all of the above answers are "NO", then may proceed with Cephalosporin use.     CURRENT MEDICATIONS:  Current Outpatient Medications  Medication Instructions  . acetaminophen (ACETAMINOPHEN 8 HOUR) 650 mg, Oral, Daily PRN  . amiodarone (PACERONE) 200 mg, Oral, Daily  . Coenzyme Q10 (COQ10 PO) 100 mg, Oral,  Every morning - 10a  . edoxaban ([name]) 60 mg, Oral, Daily  . enalapril (VASOTEC) 20 MG tablet TAKE ONE-HALF TABLET BY MOUTH EVERY EVENING  . EQ MAGNESIUM CITRATE PO Oral, Daily  . Fish Oil 1,000 mg, Oral, 2 times daily  . furosemide (LASIX) 40 MG tablet TAKE ONE AND ONE-HALF (1  1/2) TABLET BY MOUTH EVERY MORNING  . IRON PO 1 tablet, Oral, Daily  . levothyroxine (SYNTHROID) 125 MCG tablet TAKE 1 TABLET BY MOUTH ONCE DAILY BEFORE BREAKFAST  . metoprolol succinate (TOPROL-XL) 50 MG 24 hr tablet TAKE ONE TABLET BY MOUTH EVERY MORNING AND TAKE ONE-HALF TABLET BY MOUTH EVERY EVENING  . Misc  Natural Products (OSTEO BI-FLEX JOINT SHIELD PO) 2 tablets, Oral,  Every morning - 10a  . Multiple Vitamin (MULTIVITAMIN) tablet 1 tablet, Oral, Daily  . omeprazole (PRILOSEC) 40 MG capsule TAKE ONE CAPSULE BY MOUTH DAILY  . polyvinyl alcohol (LIQUIFILM TEARS) 1.4 % ophthalmic solution 1 drop, Both Eyes, Daily PRN  . vitamin C (ASCORBIC ACID) 1,000 mg, Oral, 2 times daily  . Vitamin D 2,000 Units, Oral, 2 times daily  . zinc gluconate 50 mg, Oral, Daily    PHYSICAL EXAMINATION:    VITALS:   Vitals:   09/02/19 0927  BP: (!) 169/70  Pulse: (!) 56  SpO2: 99%  Weight: 194 lb (88 kg)  Height: 5\' 10"  (1.778 m)    GEN:  The patient appears stated age and is in  NAD. HEENT:  Normocephalic, atraumatic.  The mucous membranes are moist. The superficial temporal arteries are without ropiness or tenderness. CV:  . regular Lungs:  CTAB Neck/HEME:  There are no carotid bruits bilaterally.  Neurological examination:  Orientation: The patient is alert and oriented x3.  Cranial nerves: There is good facial symmetry.  Extraocular muscles are intact. The visual fields are full to confrontational testing. The speech is fluent and clear. Soft palate rises symmetrically and there is no tongue deviation. Hearing is decreased to conversational tone. Sensation: Sensation is intact to light touch throughout  Motor: Strength is 5/5 in the bilateral upper and lower extremities.   Shoulder shrug is equal and symmetric.  There is no pronator drift.   Movement examination: Tone: There is normal  tone in the bilateral upper extremities.  The tone in the lower extremities is normal.  Abnormal movements: there is "yes" head tremor.  There is occ RUE rest tremor.  There is postural tremor, L>R.  It is a bit better with a weight.  he has tremor with archimedes spirals.  Tremor is evident when he pours water from 1 glass to another. Coordination:  There is no decremation with RAM's Gait and Station: The patient pushes off of the chair to arise.  His stride length is good.  He has good arm swing.    I have reviewed and interpreted the following labs independently Lab Results  Component Value Date   TSH 2.760 09/01/2019     Chemistry      Component Value Date/Time   NA 141 09/01/2019 0914   K 5.0 09/01/2019 0914   CL 100 09/01/2019 0914   CO2 26 09/01/2019 0914   BUN 39 (H) 09/01/2019 0914   CREATININE 2.02 (H) 09/01/2019 0914   CREATININE 1.84 (H) 11/24/2016 1412      Component Value Date/Time   CALCIUM 10.3 (H) 09/01/2019 0914   ALKPHOS 64 09/01/2019 0914   AST 26 09/01/2019 0914   ALT 26 09/01/2019 0914   BILITOT 0.4 09/01/2019 0914     Lab Results    Component Value Date   WBC 8.6 09/01/2019   HGB 12.3 (L) 09/01/2019   HCT 36.6 (L) 09/01/2019   MCV 92 09/01/2019   PLT 205 09/01/2019     ASSESSMENT/PLAN:  1.   Essential Tremor.  -This is evidenced by the symmetrical nature and longstanding hx of gradually getting worse.  We discussed nature and pathophysiology.  We discussed that this can continue to gradually get worse with time.  We discussed that some medications can worsen this, as can caffeine use.  I do suspect that his tremor has been made worse by amiodarone.  He and  I discussed the statistics associated with this.  Nonetheless, he did try to reduce the amiodarone, and ended up going back into atrial fibrillation.  We discussed medication therapy as well as surgical therapy.  He is very limited in medication options because he cannot take primidone because of interaction with edoxaban.  Ultimately, the patient wanted reassurance that he did not have Parkinson's disease.  I do not see any evidence of that today, but also told him that does not mean he will get that in the future.  He does have a family history of such, although most Parkinson's is sporadic and not hereditary.  -Patient and I discussed nonmedicinal options that may be helpful for his tremor such as weighted gloves, weighted spoons and forks.  He is going to try those options.  -Encouraged the patient to follow back up with me should any new symptoms arise.  He was agreeable.   Total time spent on today's visit was 60 minutes, including both face-to-face time and nonface-to-face time.  Time included that spent on review of records (prior notes available to me/labs/imaging if pertinent), discussing treatment and goals, answering patient's questions and coordinating care.  Cc:  Lavada Mesi, MD

## 2019-09-01 ENCOUNTER — Encounter: Payer: Self-pay | Admitting: Cardiology

## 2019-09-01 ENCOUNTER — Other Ambulatory Visit: Payer: Self-pay | Admitting: Cardiology

## 2019-09-01 ENCOUNTER — Ambulatory Visit (INDEPENDENT_AMBULATORY_CARE_PROVIDER_SITE_OTHER): Payer: Medicare Other | Admitting: Cardiology

## 2019-09-01 ENCOUNTER — Ambulatory Visit
Admission: RE | Admit: 2019-09-01 | Discharge: 2019-09-01 | Disposition: A | Discharge status: Home / Self Care | Payer: Medicare Other | Source: Ambulatory Visit | Attending: Cardiology | Admitting: Cardiology

## 2019-09-01 ENCOUNTER — Other Ambulatory Visit: Payer: Self-pay

## 2019-09-01 VITALS — BP 142/66 | HR 54 | Temp 97.3°F | Ht 70.0 in | Wt 194.0 lb

## 2019-09-01 DIAGNOSIS — I340 Nonrheumatic mitral (valve) insufficiency: Secondary | ICD-10-CM | POA: Diagnosis not present

## 2019-09-01 DIAGNOSIS — I480 Paroxysmal atrial fibrillation: Secondary | ICD-10-CM | POA: Diagnosis not present

## 2019-09-01 DIAGNOSIS — I10 Essential (primary) hypertension: Secondary | ICD-10-CM

## 2019-09-01 DIAGNOSIS — I4891 Unspecified atrial fibrillation: Secondary | ICD-10-CM | POA: Diagnosis not present

## 2019-09-01 LAB — CBC
Hematocrit: 36.6 % — ABNORMAL LOW (ref 37.5–51.0)
Hemoglobin: 12.3 g/dL — ABNORMAL LOW (ref 13.0–17.7)
MCH: 30.9 pg (ref 26.6–33.0)
MCHC: 33.6 g/dL (ref 31.5–35.7)
MCV: 92 fL (ref 79–97)
Platelets: 205 10*3/uL (ref 150–450)
RBC: 3.98 x10E6/uL — ABNORMAL LOW (ref 4.14–5.80)
RDW: 12.3 % (ref 11.6–15.4)
WBC: 8.6 10*3/uL (ref 3.4–10.8)

## 2019-09-01 LAB — COMPREHENSIVE METABOLIC PANEL
ALT: 26 IU/L (ref 0–44)
AST: 26 IU/L (ref 0–40)
Albumin/Globulin Ratio: 1.7 (ref 1.2–2.2)
Albumin: 4.5 g/dL (ref 3.7–4.7)
Alkaline Phosphatase: 64 IU/L (ref 39–117)
BUN/Creatinine Ratio: 19 (ref 10–24)
BUN: 39 mg/dL — ABNORMAL HIGH (ref 8–27)
Bilirubin Total: 0.4 mg/dL (ref 0.0–1.2)
CO2: 26 mmol/L (ref 20–29)
Calcium: 10.3 mg/dL — ABNORMAL HIGH (ref 8.6–10.2)
Chloride: 100 mmol/L (ref 96–106)
Creatinine, Ser: 2.02 mg/dL — ABNORMAL HIGH (ref 0.76–1.27)
GFR calc Af Amer: 35 mL/min/{1.73_m2} — ABNORMAL LOW (ref >59)
GFR calc non Af Amer: 30 mL/min/{1.73_m2} — ABNORMAL LOW (ref >59)
Globulin, Total: 2.7 g/dL (ref 1.5–4.5)
Glucose: 96 mg/dL (ref 65–99)
Potassium: 5 mmol/L (ref 3.5–5.2)
Sodium: 141 mmol/L (ref 134–144)
Total Protein: 7.2 g/dL (ref 6.0–8.5)

## 2019-09-01 LAB — TSH: TSH: 2.76 u[IU]/mL (ref 0.450–4.500)

## 2019-09-01 NOTE — Patient Instructions (Signed)
Medication Instructions:  NO CHANGE *If you need a refill on your cardiac medications before your next appointment, please call your pharmacy*   Lab Work: Your physician recommends that you HAVE LAB WORK TODAY If you have labs (blood work) drawn today and your tests are completely normal, you will receive your results only by: Marland Kitchen MyChart Message (if you have MyChart) OR . A paper copy in the mail If you have any lab test that is abnormal or we need to change your treatment, we will call you to review the results.   Testing/Procedures: A chest x-ray takes a picture of the organs and structures inside the chest, including the heart, lungs, and blood vessels. This test can show several things, including, whether the heart is enlarges; whether fluid is building up in the lungs; and whether pacemaker / defibrillator leads are still in place. [address]= [place] [facility]   Follow-Up: At [facility], you and your health needs are our priority.  As part of our continuing mission to provide you with exceptional heart care, we have created designated Provider Care Teams.  These Care Teams include your primary Cardiologist (physician) and Advanced Practice Providers (APPs -  Physician Assistants and Nurse Practitioners) who all work together to provide you with the care you need, when you need it.  We recommend signing up for the patient portal called "MyChart".  Sign up information is provided on this After Visit Summary.  MyChart is used to connect with patients for Virtual Visits (Telemedicine).  Patients are able to view lab/test results, encounter notes, upcoming appointments, etc.  Non-urgent messages can be sent to your provider as well.   To learn more about what you can do with MyChart, go to [email or web address].    Your next appointment:   6 month(s)  The format for your next appointment:   Either In Person or Virtual  Provider:   You may see Olga Millers MD or one of  the following Advanced Practice Providers on your designated Care Team:    Corine Shelter, PA-C  [place], [place]  Edd Fabian, [place]

## 2019-09-02 ENCOUNTER — Ambulatory Visit: Payer: Medicare Other | Admitting: Neurology

## 2019-09-02 ENCOUNTER — Other Ambulatory Visit: Payer: Self-pay

## 2019-09-02 ENCOUNTER — Encounter: Payer: Self-pay | Admitting: Neurology

## 2019-09-02 ENCOUNTER — Other Ambulatory Visit: Payer: Self-pay | Admitting: *Deleted

## 2019-09-02 VITALS — BP 169/70 | HR 56 | Ht 70.0 in | Wt 194.0 lb

## 2019-09-02 DIAGNOSIS — G250 Essential tremor: Secondary | ICD-10-CM

## 2019-09-02 DIAGNOSIS — I480 Paroxysmal atrial fibrillation: Secondary | ICD-10-CM | POA: Diagnosis not present

## 2019-09-02 DIAGNOSIS — I4891 Unspecified atrial fibrillation: Secondary | ICD-10-CM

## 2019-09-02 MED ORDER — EDOXABAN TOSYLATE 30 MG PO TABS: 30.0000 mg | ORAL_TABLET | Freq: Every day | ORAL | 3 refills | Status: DC

## 2019-09-02 NOTE — Patient Instructions (Signed)
Good to see you today!  We discussed trying the 1/2 to 1 lb weighted glove (fingerless) that you can get from [place].  You can also try weighted spoons/forks. We decided to hold on medications for now.   We discussed that amiodarone may (and likely is) be increasing the tremor but probably nothing to be done about that part.    You do not have Parkinsons disease today.  The physicians and staff at [facility] are committed to providing excellent care. You may receive a survey requesting feedback about your experience at our office. We strive to receive "very good" responses to the survey questions. If you feel that your experience would prevent you from giving the office a "very good " response, please contact our office to try to remedy the situation. We may be reached at [phone]. Thank you for taking the time out of your busy day to complete the survey.

## 2019-09-17 ENCOUNTER — Other Ambulatory Visit: Payer: Self-pay | Admitting: Cardiology

## 2019-09-17 DIAGNOSIS — E039 Hypothyroidism, unspecified: Secondary | ICD-10-CM

## 2019-11-26 ENCOUNTER — Other Ambulatory Visit: Payer: Self-pay | Admitting: Cardiology

## 2019-11-26 DIAGNOSIS — I4819 Other persistent atrial fibrillation: Secondary | ICD-10-CM

## 2019-12-06 ENCOUNTER — Other Ambulatory Visit: Payer: Self-pay | Admitting: Cardiology

## 2019-12-06 DIAGNOSIS — E039 Hypothyroidism, unspecified: Secondary | ICD-10-CM

## 2020-02-28 ENCOUNTER — Other Ambulatory Visit: Payer: Self-pay | Admitting: Cardiology

## 2020-03-15 ENCOUNTER — Other Ambulatory Visit: Payer: Self-pay | Admitting: Cardiology

## 2020-03-15 ENCOUNTER — Ambulatory Visit (INDEPENDENT_AMBULATORY_CARE_PROVIDER_SITE_OTHER): Payer: Medicare Other | Admitting: Family Medicine

## 2020-03-15 ENCOUNTER — Other Ambulatory Visit: Payer: Self-pay

## 2020-03-15 ENCOUNTER — Encounter: Payer: Self-pay | Admitting: Family Medicine

## 2020-03-15 VITALS — BP 158/58 | HR 59 | Ht 70.0 in | Wt 184.2 lb

## 2020-03-15 DIAGNOSIS — E039 Hypothyroidism, unspecified: Secondary | ICD-10-CM

## 2020-03-15 DIAGNOSIS — Z0000 Encounter for general adult medical examination without abnormal findings: Secondary | ICD-10-CM

## 2020-03-15 DIAGNOSIS — I10 Essential (primary) hypertension: Secondary | ICD-10-CM

## 2020-03-15 DIAGNOSIS — I509 Heart failure, unspecified: Secondary | ICD-10-CM

## 2020-03-15 DIAGNOSIS — G4731 Primary central sleep apnea: Secondary | ICD-10-CM

## 2020-03-15 DIAGNOSIS — I4891 Unspecified atrial fibrillation: Secondary | ICD-10-CM

## 2020-03-15 DIAGNOSIS — I4819 Other persistent atrial fibrillation: Secondary | ICD-10-CM

## 2020-03-15 DIAGNOSIS — G4739 Other sleep apnea: Secondary | ICD-10-CM

## 2020-03-15 DIAGNOSIS — N183 Chronic kidney disease, stage 3 unspecified: Secondary | ICD-10-CM

## 2020-03-15 DIAGNOSIS — K219 Gastro-esophageal reflux disease without esophagitis: Secondary | ICD-10-CM | POA: Diagnosis not present

## 2020-03-15 DIAGNOSIS — R5383 Other fatigue: Secondary | ICD-10-CM | POA: Diagnosis not present

## 2020-03-15 DIAGNOSIS — J849 Interstitial pulmonary disease, unspecified: Secondary | ICD-10-CM

## 2020-03-15 DIAGNOSIS — R251 Tremor, unspecified: Secondary | ICD-10-CM

## 2020-03-15 NOTE — Progress Notes (Signed)
Office Visit Note   Patient: Charles [name]           Date of Birth: 18-Nov-1940           MRN: [id number] Visit Date: 03/15/2020 Requested by: Charles Mesi, MD [address] [place],  [place] [address] PCP: Charles Mesi, MD  Subjective: Chief Complaint  Patient presents with  . Medicare Wellness    HPI: He is here for annual wellness exam.  His primary complaint is fatigue.  He thinks it is from low heart rate due to medications for atrial fibrillation.  Every time his medications are adjusted, his heart rate becomes irregular so he has to maintain at a lower rate.  From a cardiac standpoint he is doing well, monitored by Dr. Jens [name].  He takes Synthroid 125 mcg daily for hypothyroidism.  He is on omeprazole every other day for GERD.                ROS: Denies any vision change, hearing change, dental issues.  Denies any gastrointestinal concerns.  He is not having any musculoskeletal complaints.  He has not noticed any suspicious skin lesions.  All other systems were reviewed and are negative.  Health Maintenance: Colonoscopy: No longer needed. Immunizations:  Up to date.  Adamant that he does not want the covid vaccine. Pap/Mammogram:  NA Dentist:  In near future Eye:  In near future   Objective: Vital Signs: BP (!) 158/58   Pulse (!) 59   Ht 5\' 10"  (1.778 m)   Wt 184 lb 3.2 oz (83.6 kg)   BMI 26.43 kg/m   Physical Exam:  HEENT:  [place]/AT, PERRLA, EOM Full, no nystagmus.  Funduscopic examination within normal limits.  No conjunctival erythema.  Tympanic membranes are pearly gray with normal landmarks.  External ear canals are normal.  Nasal passages are clear.  Oropharynx is clear.  No significant lymphadenopathy.  No thyromegaly or nodules.  2+ carotid pulses without bruits. CV: Regular rate and rhythm without murmurs, rubs, or gallops.  No peripheral edema.  2+ radial and posterior tibial pulses. Lungs: Clear to auscultation throughout with no wheezing or  areas of consolidation. Abd: Bowel sounds are active, no hepatosplenomegaly or masses.  Soft and nontender.  No audible bruits.  No evidence of ascites.    Imaging: None today  Assessment & Plan: 1.  Wellness examination -Labs ordered. -Medication refills when needed.  2.  Chronic fatigue probably due to bradycardia -Labs to look for other causes.  3.  GERD -We will check magnesium and B12 levels.  4.  Hypothyroidism -We will check levels today.  5.  Vaccine counseling We had a lengthy discussion today about other options for prevention of Covid. is choosing not to become vaccinated for several reasons.  He understands that vaccines are recommended, particularly for his age and risk category.  #1 religious. There is data to suggest Covid vaccines are derived from protein testing using a cell line [name] which is an aborted fetal tissue derived cell line. This does not correspond to [name]'s deeply held religious believes.  #2 there have been reports on the [name] database of approximately 12,000 adverse events including death and hospitalization related to vaccination. In reality the numbers likely higher by factor of 5 or 6 based on previous vaccine side effect profiles.  For these reasons we have both researched repurposed drugs that can be taken in the prevention and treatment of COVID-19. Two of these medications are hydroxychloroquine and  ivermectin. The side effect profile for long-term use of ivermectin is more favorable than hydroxychloroquine. I have checked for interactions with [facility] [name]'s current meds and found none.  More than 3.7 billion doses of ivermectin have been given over the past 30 years with an excellent safety profile confirmed by the [place] [name]. An ongoing meta-analysis of 65 studies was reviewed. The studies included nearly 20,000 patients and 578 authors. A snapshot of this ongoing analysis was recently published in the [name]. Included in the  61 studies are 32 randomized controlled trials. In summary there is moderately certain evidence that ivermectin use is associated with a 62% reduction in COVID-19 mortality when used for early treatment and up to 96% lower mortality when used as prophylaxis. A practical application of ivermectin into the prophylaxis and treatment protocol regimen for a large population occurred in [name] which contains 241 million people. The delta variant was running rampant in this state. The population was 5% vaccinated. The population went from 35,000 cases and 350 deaths per day to approximately 26 daily cases and 3 daily deaths several weeks after addition of ivermectin to the treatment protocol. At the very least this data suggests that ivermectin is a reasonable medication to consider for someone who is not vaccinated who is exposed to the public on a daily basis. We discussed at length the pros and cons of this approach. The side effects of the medication were also discussed. We also discussed how at this time ivermectin is not approved officially for this purpose but based on the data available and through the process of shared decision making we agree that this is something reasonable to try.    Follow-Up Instructions: No follow-ups on file.     Procedures: None   PMFS History: Patient Active Problem List   Diagnosis Date Noted  . ILD (interstitial lung disease) (HCC) 08/02/2016  . Asbestos exposure 06/08/2016  . Persistent atrial fibrillation (HCC)   . Bruit 10/01/2015  . Charles/O total hip arthroplasty 08/12/2015  . Hyperglycemia   . CKD (chronic kidney disease), stage III   . Osteoarthritis of left knee 11/20/2013  . Complex tear of medial meniscus of left knee as current injury 11/20/2013  . Encounter for therapeutic drug monitoring 09/15/2013  . Complex sleep apnea syndrome 11/21/2012  . Nonischemic cardiomyopathy (HCC) 11/09/2012  . Snoring 11/09/2012  . Hypothyroidism  08/26/2012  . Long term (current) use of anticoagulants 08/01/2012  . Tremor 10/24/2011  . Atrial fibrillation (HCC)   . Peripheral edema   . Swelling   . Mitral regurgitation   . Orthopnea   . Dyspnea   . CHF (congestive heart failure) (HCC)   . Hypertension   . Hypercholesterolemia   . Chronic renal insufficiency   . HYPERTENSION 11/07/2006  . GERD 11/07/2006  . ABDOMINAL WALL HERNIA 11/07/2006  . OSTEOARTHROSIS NOS, UNSPECIFIED SITE 11/07/2006  . ABRASION, LOWER EXTREMITY W/INFECTION 11/07/2006  . NEPHROLITHIASIS, HX OF 11/07/2006  . HIP REPLACEMENT, TOTAL, HX OF 11/07/2006   Past Medical History:  Diagnosis Date  . Arthritis   . Atrial dilatation    mild biatrial dilitation  . Bruises easily   . CHF (congestive heart failure) (HCC)   . CKD (chronic kidney disease), stage III   . Coronary artery disease   . Dysrhythmia    atrial fibrillation  . GERD (gastroesophageal reflux disease)   . Charles/O hiatal hernia   . Hypercholesterolemia   . Hyperglycemia   . Hypertension   .  Hypothyroidism   . Mitral regurgitation    a. Mild by echo in 01/2014.  Marland Kitchen Nonischemic cardiomyopathy (HCC)    a. presumed to be tachycardia mediated. Varying EFs over the years from 35-40% to normal and back.  . Paroxysmal atrial fibrillation (HCC)    a. Charles/o difficult to control. b. s/p PVI ablation 12-03-2012 by [name] with recurrence afterwards, requiring period of amiodarone.  . Peripheral edema   . Sleep apnea    USES C PAP  . Tear of medial meniscus of left knee   . Tremor     Family History  Problem Relation Age of Onset  . Cancer Mother 64       CANCER  . Heart disease Father 18       HEART PROBLEMS  . Lupus Brother        "of the skin"    Past Surgical History:  Procedure Laterality Date  . ATRIAL FIBRILLATION ABLATION N/A 12/03/2012   Procedure: ATRIAL FIBRILLATION ABLATION;  Surgeon: Hillis Range, MD;  Location: [facility] CATH LAB;  Service: Cardiovascular;  Laterality: N/A;  . ATRIAL  FIBRILLATION ABLATION     PVI by [name]  . CARDIAC CATHETERIZATION    . CARDIOVERSION N/A 09/04/2012   Procedure: CARDIOVERSION;  Surgeon: [name], MD;  Location: [facility] ENDOSCOPY;  Service: Cardiovascular;  Laterality: N/A;  . CARDIOVERSION N/A 02/11/2013   Procedure: CARDIOVERSION;  Surgeon: [name], MD;  Location: [facility] ENDOSCOPY;  Service: Cardiovascular;  Laterality: N/A;  . CARDIOVERSION N/A 12/07/2015   Procedure: CARDIOVERSION;  Surgeon: Chrystie Nose, MD;  Location: [name] ENDOSCOPY;  Service: Cardiovascular;  Laterality: N/A;  . CARDIOVERSION N/A 03/11/2018   Procedure: CARDIOVERSION;  Surgeon: Lars Masson, MD;  Location: [facility] ENDOSCOPY;  Service: Cardiovascular;  Laterality: N/A;  . CATARACT EXTRACTION, BILATERAL    . EYE SURGERY  2015,2016   bilateral cataract surgery with lens implants  . HERNIA REPAIR  03/2005  . JOINT REPLACEMENT  2005   total right hip replacement  . kidney stent  1987, 02/2005  . KIDNEY STONE SURGERY     2 stents placed  . KNEE ARTHROSCOPY WITH DRILLING/MICROFRACTURE Left 11/20/2013   Procedure: KNEE ARTHROSCOPY WITH DRILLING/MICROFRACTURE;  Surgeon: Jacki Cones, MD;  Location: [facility] ORS;  Service: Orthopedics;  Laterality: Left;  . KNEE ARTHROSCOPY WITH MEDIAL MENISECTOMY Left 11/20/2013   Procedure: LEFT KNEE ARTHROSCOPY WITH MEDIAL MENISECTOMY;  Surgeon: Jacki Cones, MD;  Location: [facility]S;  Service: Orthopedics;  Laterality: Left;  Microfracture of medial femoral chondyle and abrasion chondroplasty of the medial femoral chondyle  . LITHOTRIPSY     x 2  . PICC line placement  2007  . quadrec  1999   LFT leg quad sx  . right hip surgery  2007   irrigation and debridement from bacteria  . TEE WITHOUT CARDIOVERSION N/A 12/02/2012   Procedure: TRANSESOPHAGEAL ECHOCARDIOGRAM (TEE);  Surgeon: [name], MD;  Location: [facility] ENDOSCOPY;  Service: Cardiovascular;  Laterality: N/A;  . TOTAL HIP ARTHROPLASTY Left 08/12/2015   Procedure: LEFT  TOTAL HIP ARTHROPLASTY;  Surgeon: Ranee Gosselin, MD;  Location: [facility];  Service: Orthopedics;  Laterality: Left;   Social History   Occupational History  . Occupation: Recruitment consultant: RETIRED  Tobacco Use  . Smoking status: Never Smoker  . Smokeless tobacco: Never Used  Vaping Use  . Vaping Use: Never used  Substance and Sexual Activity  . Alcohol use: No  . Drug use: No  .  Sexual activity: Not on file

## 2020-03-16 ENCOUNTER — Telehealth: Payer: Self-pay | Admitting: Family Medicine

## 2020-03-16 ENCOUNTER — Encounter: Payer: Self-pay | Admitting: Family Medicine

## 2020-03-16 LAB — COMPREHENSIVE METABOLIC PANEL
AG Ratio: 1.8 (calc) (ref 1.0–2.5)
ALT: 17 U/L (ref 9–46)
AST: 22 U/L (ref 10–35)
Albumin: 4.7 g/dL (ref 3.6–5.1)
Alkaline phosphatase (APISO): 58 U/L (ref 35–144)
BUN/Creatinine Ratio: 27 (calc) — ABNORMAL HIGH (ref 6–22)
BUN: 50 mg/dL — ABNORMAL HIGH (ref 7–25)
CO2: 26 mmol/L (ref 20–32)
Calcium: 10.7 mg/dL — ABNORMAL HIGH (ref 8.6–10.3)
Chloride: 103 mmol/L (ref 98–110)
Creat: 1.88 mg/dL — ABNORMAL HIGH (ref 0.70–1.11)
Globulin: 2.6 g/dL (calc) (ref 1.9–3.7)
Glucose, Bld: 106 mg/dL — ABNORMAL HIGH (ref 65–99)
Potassium: 4.5 mmol/L (ref 3.5–5.3)
Sodium: 140 mmol/L (ref 135–146)
Total Bilirubin: 0.5 mg/dL (ref 0.2–1.2)
Total Protein: 7.3 g/dL (ref 6.1–8.1)

## 2020-03-16 LAB — MAGNESIUM: Magnesium: 2.4 mg/dL (ref 1.5–2.5)

## 2020-03-16 LAB — LIPID PANEL
Cholesterol: 187 mg/dL
HDL: 41 mg/dL
LDL Cholesterol (Calc): 114 mg/dL — ABNORMAL HIGH
Non-HDL Cholesterol (Calc): 146 mg/dL — ABNORMAL HIGH
Total CHOL/HDL Ratio: 4.6 (calc)
Triglycerides: 208 mg/dL — ABNORMAL HIGH

## 2020-03-16 LAB — HIGH SENSITIVITY CRP: hs-CRP: 3.3 mg/L — ABNORMAL HIGH

## 2020-03-16 LAB — CBC WITH DIFFERENTIAL/PLATELET
Absolute Monocytes: 979 {cells}/uL — ABNORMAL HIGH (ref 200–950)
Basophils Absolute: 76 {cells}/uL (ref 0–200)
Basophils Relative: 0.8 %
Eosinophils Absolute: 485 {cells}/uL (ref 15–500)
Eosinophils Relative: 5.1 %
HCT: 35.3 % — ABNORMAL LOW (ref 38.5–50.0)
Hemoglobin: 11.8 g/dL — ABNORMAL LOW (ref 13.2–17.1)
Lymphs Abs: 3183 {cells}/uL (ref 850–3900)
MCH: 31.6 pg (ref 27.0–33.0)
MCHC: 33.4 g/dL (ref 32.0–36.0)
MCV: 94.4 fL (ref 80.0–100.0)
MPV: 10.2 fL (ref 7.5–12.5)
Monocytes Relative: 10.3 %
Neutro Abs: 4779 {cells}/uL (ref 1500–7800)
Neutrophils Relative %: 50.3 %
Platelets: 227 Thousand/uL (ref 140–400)
RBC: 3.74 Million/uL — ABNORMAL LOW (ref 4.20–5.80)
RDW: 11.4 % (ref 11.0–15.0)
Total Lymphocyte: 33.5 %
WBC: 9.5 Thousand/uL (ref 3.8–10.8)

## 2020-03-16 LAB — THYROID PANEL WITH TSH
Free Thyroxine Index: 3.9 — ABNORMAL HIGH (ref 1.4–3.8)
T3 Uptake: 37 % — ABNORMAL HIGH (ref 22–35)
T4, Total: 10.6 ug/dL — ABNORMAL HIGH (ref 4.9–10.5)
TSH: 1.49 mIU/L (ref 0.40–4.50)

## 2020-03-16 LAB — IRON,TIBC AND FERRITIN PANEL
%SAT: 22 % (calc) (ref 20–48)
Ferritin: 131 ng/mL (ref 24–380)
Iron: 71 ug/dL (ref 50–180)
TIBC: 321 mcg/dL (calc) (ref 250–425)

## 2020-03-16 LAB — VITAMIN D 25 HYDROXY (VIT D DEFICIENCY, FRACTURES): Vit D, 25-Hydroxy: 99 ng/mL (ref 30–100)

## 2020-03-16 LAB — VITAMIN B12: Vitamin B-12: 862 pg/mL (ref 200–1100)

## 2020-03-16 LAB — PSA: PSA: 0.27 ng/mL (ref <=4.0)

## 2020-03-16 NOTE — Telephone Encounter (Signed)
Labs show:  B12, vitamin D, and magnesium look good.  Thyroid is in good range.  No dosage change needed.  Prostate PSA looks perfect.  Triglycerides and LDL are up a bit.  I recommend getting exercise regularly (walking is great), and from a dietary standpoint, you should minimize intake of processed carbs (breads; pastas; cereals; sugars/sweets).  These changes will likely also help improve the CRP (inflammation marker).  Would recheck these labs in 3-6 months.  Blood glucose is in prediabetes range at 106.  This should also improve with lifestyle changes as above.  Recheck in 3-6 months, along with hemoglobin A1C level.  Blood counts are slightly abnormal, but iron studies are normal.  So the anemia is not likely from iron-deficiency.  It could be medicine-related, or it could be related to some of your other medical conditions.  We'll continue to monitor periodically.  Kidney function is holding steady.  Since omeprazole has been associated with kidney damage in some patients, it would be ideal if you could eventually get off it (not good to stop "cold [place]").  I will send some additional suggestions by email.

## 2020-03-26 ENCOUNTER — Other Ambulatory Visit: Payer: Self-pay

## 2020-03-26 DIAGNOSIS — I4891 Unspecified atrial fibrillation: Secondary | ICD-10-CM

## 2020-03-26 MED ORDER — EDOXABAN TOSYLATE 30 MG PO TABS: 30.0000 mg | ORAL_TABLET | Freq: Every day | ORAL | 0 refills | Status: DC

## 2020-04-21 NOTE — Progress Notes (Signed)
HPI: FU atrial fibrillation. He has a past history of paroxysmal atrial fibrillation and tachycardia mediated cardiomyopathy. He underwent mapping and ablation of atrial fib in 11/2012 by Dr. [name]. Carotid dopplers April 2017 normal. At previousoffice visit he wanted to try to discontinue amiodarone. However he contacted the office and felt to be back in atrial fibrillation. Amiodarone was resumed. Patient had repeat cardioversion March 11, 2018.Last echocardiogram September 2019 showed normal LV function, moderate diastolic dysfunction, mild to moderate mitral regurgitation, biatrial enlargement and moderate tricuspid regurgitation. Since last seen,he has some fatigue.  He denies dyspnea, chest pain, palpitations, syncope or bleeding.  Current Outpatient Medications  Medication Sig Dispense Refill  . acetaminophen (ACETAMINOPHEN 8 HOUR) 650 MG CR tablet Take 650 mg by mouth daily as needed for pain.    Marland Kitchen amiodarone (PACERONE) 200 MG tablet Take 1 tablet (200 mg total) by mouth daily. 90 tablet 3  . Ascorbic Acid (VITAMIN C) 500 MG tablet Take 1,000 mg by mouth 2 (two) times daily.     . Cholecalciferol (VITAMIN D) 2000 UNITS CAPS Take 2,000 Units by mouth 2 (two) times daily.     . Coenzyme Q10 (COQ10 PO) Take 100 mg by mouth every morning.     . edoxaban ([name]) 30 MG TABS tablet Take 1 tablet (30 mg total) by mouth daily. 90 tablet 0  . enalapril (VASOTEC) 20 MG tablet TAKE HALF A TABLET BY MOUTH ONCE EVERY EVENING 45 tablet 3  . EQ MAGNESIUM CITRATE PO Take by mouth daily.    . furosemide (LASIX) 40 MG tablet TAKE 1 AND 1/2 TABLET BY MOUTH EVERY MORNING 135 tablet 3  . IRON PO Take 1 tablet by mouth daily at 12 noon.     Marland Kitchen levothyroxine (SYNTHROID) 125 MCG tablet TAKE 1 TABLET BY MOUTH ONCE DAILY BEFORE BREAKFAST 90 tablet 3  . metoprolol succinate (TOPROL-XL) 50 MG 24 hr tablet TAKE ONE TABLET BY MOUTH EVERY MORNING AND TAKE ONE-HALF TABLET BY MOUTH EVERY EVENING 135  tablet 1  . Misc Natural Products (OSTEO BI-FLEX JOINT SHIELD PO) Take 2 tablets by mouth every morning.     . Multiple Vitamin (MULTIVITAMIN) tablet Take 1 tablet by mouth daily at 12 noon.     . Omega-3 Fatty Acids (FISH OIL) 1000 MG CAPS Take 1,000 mg by mouth 2 (two) times daily.     Marland Kitchen omeprazole (PRILOSEC) 40 MG capsule Take 1 capsule (40 mg total) by mouth daily. 90 capsule 3  . polyvinyl alcohol (LIQUIFILM TEARS) 1.4 % ophthalmic solution Place 1 drop into both eyes daily as needed for dry eyes.    Marland Kitchen zinc gluconate 50 MG tablet Take 50 mg by mouth daily at 12 noon.      No current facility-administered medications for this visit.     Past Medical History:  Diagnosis Date  . Arthritis   . Atrial dilatation    mild biatrial dilitation  . Bruises easily   . CHF (congestive heart failure) (HCC)   . CKD (chronic kidney disease), stage III (HCC)   . Coronary artery disease   . Dysrhythmia    atrial fibrillation  . GERD (gastroesophageal reflux disease)   . H/O hiatal hernia   . Hypercholesterolemia   . Hyperglycemia   . Hypertension   . Hypothyroidism   . Mitral regurgitation    a. Mild by echo in 01/2014.  Marland Kitchen Nonischemic cardiomyopathy (HCC)    a. presumed to be tachycardia mediated. Varying EFs  over the years from 35-40% to normal and back.  . Paroxysmal atrial fibrillation (HCC)    a. H/o difficult to control. b. s/p PVI ablation 12-03-2012 by [name] with recurrence afterwards, requiring period of amiodarone.  . Peripheral edema   . Sleep apnea    USES C PAP  . Tear of medial meniscus of left knee   . Tremor     Past Surgical History:  Procedure Laterality Date  . ATRIAL FIBRILLATION ABLATION N/A 12/03/2012   Procedure: ATRIAL FIBRILLATION ABLATION;  Surgeon: Hillis Range, MD;  Location: [facility] CATH LAB;  Service: Cardiovascular;  Laterality: N/A;  . ATRIAL FIBRILLATION ABLATION     PVI by [name]  . CARDIAC CATHETERIZATION    . CARDIOVERSION N/A 09/04/2012    Procedure: CARDIOVERSION;  Surgeon: [name], MD;  Location: [facility] ENDOSCOPY;  Service: Cardiovascular;  Laterality: N/A;  . CARDIOVERSION N/A 02/11/2013   Procedure: CARDIOVERSION;  Surgeon: [name], MD;  Location: [facility] ENDOSCOPY;  Service: Cardiovascular;  Laterality: N/A;  . CARDIOVERSION N/A 12/07/2015   Procedure: CARDIOVERSION;  Surgeon: Chrystie Nose, MD;  Location: [facility] ENDOSCOPY;  Service: Cardiovascular;  Laterality: N/A;  . CARDIOVERSION N/A 03/11/2018   Procedure: CARDIOVERSION;  Surgeon: Lars Masson, MD;  Location: [facility] ENDOSCOPY;  Service: Cardiovascular;  Laterality: N/A;  . CATARACT EXTRACTION, BILATERAL    . EYE SURGERY  2015,2016   bilateral cataract surgery with lens implants  . HERNIA REPAIR  03/2005  . JOINT REPLACEMENT  2005   total right hip replacement  . kidney stent  1987, 02/2005  . KIDNEY STONE SURGERY     2 stents placed  . KNEE ARTHROSCOPY WITH DRILLING/MICROFRACTURE Left 11/20/2013   Procedure: KNEE ARTHROSCOPY WITH DRILLING/MICROFRACTURE;  Surgeon: Jacki Cones, MD;  Location: [facility];  Service: Orthopedics;  Laterality: Left;  . KNEE ARTHROSCOPY WITH MEDIAL MENISECTOMY Left 11/20/2013   Procedure: LEFT KNEE ARTHROSCOPY WITH MEDIAL MENISECTOMY;  Surgeon: Jacki Cones, MD;  Location: [facility];  Service: Orthopedics;  Laterality: Left;  Microfracture of medial femoral chondyle and abrasion chondroplasty of the medial femoral chondyle  . LITHOTRIPSY     x 2  . PICC line placement  2007  . quadrec  1999   LFT leg quad sx  . right hip surgery  2007   irrigation and debridement from bacteria  . TEE WITHOUT CARDIOVERSION N/A 12/02/2012   Procedure: TRANSESOPHAGEAL ECHOCARDIOGRAM (TEE);  Surgeon: [name], MD;  Location: [facility] ENDOSCOPY;  Service: Cardiovascular;  Laterality: N/A;  . TOTAL HIP ARTHROPLASTY Left 08/12/2015   Procedure: LEFT TOTAL HIP ARTHROPLASTY;  Surgeon: Ranee Gosselin, MD;  Location: [facility];  Service: Orthopedics;  Laterality:  Left;    Social History   Socioeconomic History  . Marital status: Married    Spouse name: Kathie Rhodes  . Number of children: 2  . Years of education: Not on file  . Highest education level: Some college, no degree  Occupational History  . Occupation: Recruitment consultant: RETIRED  Tobacco Use  . Smoking status: Never Smoker  . Smokeless tobacco: Never Used  Vaping Use  . Vaping Use: Never used  Substance and Sexual Activity  . Alcohol use: No  . Drug use: No  . Sexual activity: Not on file  Other Topics Concern  . Not on file  Social History Narrative   Lives in [place] with spouse.  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   [employer]  [employer]:   Has worked extensively in [employer]. Significant asbestos asbestos exposure. No bird or mold exposure.       Right handed   Social Determinants of Health   Financial Resource Strain:   . Difficulty of Paying Living Expenses: Not on file  Food Insecurity:   . Worried About Programme researcher, broadcasting/film/video in the Last Year: Not on file  . Ran Out of Food in the Last Year: Not on file  Transportation Needs:   . Lack of Transportation (Medical): Not on file  . Lack of Transportation (Non-Medical): Not on file  Physical Activity:   . Days of Exercise per Week: Not on file  . Minutes of Exercise per Session: Not on file  Stress:   . Feeling of Stress : Not on file  Social Connections:   . Frequency of Communication with Friends and Family: Not on file  . Frequency of Social Gatherings with Friends and Family: Not on file  . Attends Religious Services: Not on file  . Active Member of Clubs or Organizations: Not on file  . Attends Banker Meetings: Not on file  . Marital Status: Not on file  Intimate Partner Violence:   . Fear of Current or Ex-Partner: Not on file  . Emotionally Abused: Not on file  . Physically Abused: Not on file  . Sexually Abused: Not on file     Family History  Problem Relation Age of Onset  . Cancer Mother 19       CANCER  . Heart disease Father 73       HEART PROBLEMS  . Lupus Brother        "of the skin"    ROS: no fevers or chills, productive cough, hemoptysis, dysphasia, odynophagia, melena, hematochezia, dysuria, hematuria, rash, seizure activity, orthopnea, PND, pedal edema, claudication. Remaining systems are negative.  Physical Exam: Well-developed well-nourished in no acute distress.  Skin is warm and dry.  HEENT is normal.  Neck is supple.  Chest is clear to auscultation with normal expansion.  Cardiovascular exam is regular rate and rhythm.  Abdominal exam nontender or distended. No masses palpated. Extremities show no edema. neuro grossly intact  ECG-sinus bradycardia at a rate of 51, normal axis, no ST changes.  Personally reviewed  A/P  1 paroxysmal atrial fibrillation-patient remains in sinus rhythm.  Continue amiodarone and edoxaban.  Check chest x-ray.  2 hypertension-blood pressure controlled today.  However he is complaining of some fatigue.  This may be related to his beta-blocker and bradycardia.  He is taking 25 mg of Toprol in the evening.  We will discontinue to see if this improves his symptoms.  Follow blood pressure and increase remaining medications as needed.  3 mild to moderate mitral regurgitation-we will plan repeat echocardiogram.  4 chronic stage III kidney disease  Olga Millers, MD

## 2020-04-27 ENCOUNTER — Ambulatory Visit: Payer: Medicare Other | Admitting: Cardiology

## 2020-04-27 ENCOUNTER — Other Ambulatory Visit: Payer: Self-pay

## 2020-04-27 ENCOUNTER — Encounter: Payer: Self-pay | Admitting: Cardiology

## 2020-04-27 VITALS — BP 140/62 | HR 51 | Temp 97.3°F | Ht 70.0 in | Wt 191.4 lb

## 2020-04-27 DIAGNOSIS — I10 Essential (primary) hypertension: Secondary | ICD-10-CM | POA: Diagnosis not present

## 2020-04-27 DIAGNOSIS — I480 Paroxysmal atrial fibrillation: Secondary | ICD-10-CM | POA: Diagnosis not present

## 2020-04-27 DIAGNOSIS — I340 Nonrheumatic mitral (valve) insufficiency: Secondary | ICD-10-CM | POA: Diagnosis not present

## 2020-04-27 NOTE — Patient Instructions (Signed)
Medication Instructions:   STOP METOPROLOL  *If you need a refill on your cardiac medications before your next appointment, please call your pharmacy*   Testing/Procedures: Your physician has requested that you have an echocardiogram. Echocardiography is a painless test that uses sound waves to create images of your heart. It provides your doctor with information about the size and shape of your heart and how well your heart's chambers and valves are working. This procedure takes approximately one hour. There are no restrictions for this procedure.[address]  A chest x-ray takes a picture of the organs and structures inside the chest, including the heart, lungs, and blood vessels. This test can show several things, including, whether the heart is enlarges; whether fluid is building up in the lungs; and whether pacemaker / defibrillator leads are still in place. [facility] [address]     Follow-Up: At [facility], you and your health needs are our priority.  As part of our continuing mission to provide you with exceptional heart care, we have created designated Provider Care Teams.  These Care Teams include your primary Cardiologist (physician) and Advanced Practice Providers (APPs -  Physician Assistants and Nurse Practitioners) who all work together to provide you with the care you need, when you need it.  We recommend signing up for the patient portal called "MyChart".  Sign up information is provided on this After Visit Summary.  MyChart is used to connect with patients for Virtual Visits (Telemedicine).  Patients are able to view lab/test results, encounter notes, upcoming appointments, etc.  Non-urgent messages can be sent to your provider as well.   To learn more about what you can do with MyChart, go to [email or web address].    Your next appointment:   6 month(s)  The format for your next appointment:   In Person  Provider:   Olga Millers,  MD

## 2020-04-28 ENCOUNTER — Ambulatory Visit
Admission: RE | Admit: 2020-04-28 | Discharge: 2020-04-28 | Disposition: A | Discharge status: Home / Self Care | Payer: Medicare Other | Source: Ambulatory Visit | Attending: Cardiology | Admitting: Cardiology

## 2020-04-28 DIAGNOSIS — J948 Other specified pleural conditions: Secondary | ICD-10-CM | POA: Diagnosis not present

## 2020-04-28 DIAGNOSIS — R918 Other nonspecific abnormal finding of lung field: Secondary | ICD-10-CM | POA: Diagnosis not present

## 2020-04-28 DIAGNOSIS — I517 Cardiomegaly: Secondary | ICD-10-CM | POA: Diagnosis not present

## 2020-04-28 DIAGNOSIS — I480 Paroxysmal atrial fibrillation: Secondary | ICD-10-CM

## 2020-04-28 DIAGNOSIS — I700 Atherosclerosis of aorta: Secondary | ICD-10-CM | POA: Diagnosis not present

## 2020-05-10 DIAGNOSIS — H35371 Puckering of macula, right eye: Secondary | ICD-10-CM | POA: Diagnosis not present

## 2020-05-10 DIAGNOSIS — H40013 Open angle with borderline findings, low risk, bilateral: Secondary | ICD-10-CM | POA: Diagnosis not present

## 2020-05-10 DIAGNOSIS — H04123 Dry eye syndrome of bilateral lacrimal glands: Secondary | ICD-10-CM | POA: Diagnosis not present

## 2020-05-10 DIAGNOSIS — Z961 Presence of intraocular lens: Secondary | ICD-10-CM | POA: Diagnosis not present

## 2020-05-31 ENCOUNTER — Ambulatory Visit (HOSPITAL_COMMUNITY): Payer: Medicare Other | Attending: Cardiology

## 2020-05-31 ENCOUNTER — Other Ambulatory Visit: Payer: Self-pay

## 2020-05-31 DIAGNOSIS — I340 Nonrheumatic mitral (valve) insufficiency: Secondary | ICD-10-CM | POA: Diagnosis not present

## 2020-05-31 LAB — ECHOCARDIOGRAM COMPLETE
Area-P 1/2: 3.63 cm2
MV M vel: 4.83 m/s
MV Peak grad: 93.3 mmHg
S' Lateral: 3.9 cm

## 2020-05-31 MED ORDER — PERFLUTREN LIPID MICROSPHERE
1.0000 mL | INTRAVENOUS | Status: AC | PRN
  Administered 2020-05-31: 2 mL via INTRAVENOUS

## 2020-07-30 ENCOUNTER — Other Ambulatory Visit: Payer: Self-pay | Admitting: Cardiology

## 2020-07-30 DIAGNOSIS — I4891 Unspecified atrial fibrillation: Secondary | ICD-10-CM

## 2020-09-10 NOTE — Telephone Encounter (Signed)
Spoke with pt, he is feeling fine and reports in the past we had increased the amiodarone over time and then he had a DCCV. Aware no changes in medications at this time and will see about getting him an appointment in the atrial fib clinic next week. Patient voiced understanding to call the on call person if he starts feeling bad over the weekend.

## 2020-09-17 NOTE — Progress Notes (Signed)
HPI: FU atrial fibrillation.He has a past history of paroxysmal atrial fibrillation and tachycardia mediated cardiomyopathy. He underwent mapping and ablation of atrial fib in 11/2012 by Dr. [name]. Carotid dopplers April 2017 normal. At previousoffice visit he wanted to try to discontinue amiodarone. However he contacted the office and felt to be back in atrial fibrillation. Amiodarone was resumed. Patient had repeat cardioversion March 11, 2018.Last echocardiogram December 2021 showed normal LV function, grade 2 diastolic dysfunction, moderate left atrial enlargement. Since last seen, patient went into atrial fibrillation approximately 1 week ago.  This is associated with fatigue.  He denies dyspnea on exertion, orthopnea, PND, pedal edema, chest pain or syncope.  Current Outpatient Medications  Medication Sig Dispense Refill  . acetaminophen (TYLENOL) 650 MG CR tablet Take 650 mg by mouth daily as needed for pain.    Marland Kitchen amiodarone (PACERONE) 200 MG tablet Take 1 tablet (200 mg total) by mouth daily. 90 tablet 3  . Ascorbic Acid (VITAMIN C) 500 MG tablet Take 1,000 mg by mouth 2 (two) times daily.     . Cholecalciferol (VITAMIN D) 2000 UNITS CAPS Take 2,000 Units by mouth 2 (two) times daily.    . Coenzyme Q10 (COQ10 PO) Take 100 mg by mouth every morning.    . enalapril (VASOTEC) 20 MG tablet TAKE HALF A TABLET BY MOUTH ONCE EVERY EVENING 45 tablet 3  . EQ MAGNESIUM CITRATE PO Take by mouth daily.    . furosemide (LASIX) 40 MG tablet TAKE 1 AND 1/2 TABLET BY MOUTH EVERY MORNING 135 tablet 3  . IRON PO Take 1 tablet by mouth daily at 12 noon.     Marland Kitchen levothyroxine (SYNTHROID) 125 MCG tablet TAKE 1 TABLET BY MOUTH ONCE DAILY BEFORE BREAKFAST 90 tablet 3  . Misc Natural Products (OSTEO BI-FLEX JOINT SHIELD PO) Take 2 tablets by mouth every morning.    . Multiple Vitamin (MULTIVITAMIN) tablet Take 1 tablet by mouth daily at 12 noon.    . Omega-3 Fatty Acids (FISH OIL) 1000 MG CAPS  Take 1,000 mg by mouth 2 (two) times daily.    Marland Kitchen omeprazole (PRILOSEC) 40 MG capsule Take 1 capsule (40 mg total) by mouth daily. 90 capsule 3  . polyvinyl alcohol (LIQUIFILM TEARS) 1.4 % ophthalmic solution Place 1 drop into both eyes daily as needed for dry eyes.    Marland Kitchen [name] 30 MG TABS tablet Take 1 tablet by mouth once daily 90 tablet 3  . zinc gluconate 50 MG tablet Take 50 mg by mouth daily at 12 noon.      No current facility-administered medications for this visit.     Past Medical History:  Diagnosis Date  . Arthritis   . Atrial dilatation    mild biatrial dilitation  . Bruises easily   . CHF (congestive heart failure) (HCC)   . CKD (chronic kidney disease), stage III (HCC)   . Coronary artery disease   . Dysrhythmia    atrial fibrillation  . GERD (gastroesophageal reflux disease)   . H/O hiatal hernia   . Hypercholesterolemia   . Hyperglycemia   . Hypertension   . Hypothyroidism   . Mitral regurgitation    a. Mild by echo in 01/2014.  Marland Kitchen Nonischemic cardiomyopathy (HCC)    a. presumed to be tachycardia mediated. Varying EFs over the years from 35-40% to normal and back.  . Paroxysmal atrial fibrillation (HCC)    a. H/o difficult to control. b. s/p PVI ablation 12-03-2012 by  [name] with recurrence afterwards, requiring period of amiodarone.  . Peripheral edema   . Sleep apnea    USES C PAP  . Tear of medial meniscus of left knee   . Tremor     Past Surgical History:  Procedure Laterality Date  . ATRIAL FIBRILLATION ABLATION N/A 12/03/2012   Procedure: ATRIAL FIBRILLATION ABLATION;  Surgeon: Hillis Range, MD;  Location[facility] CATH LAB;  Service: Cardiovascular;  Laterality: N/A;  . ATRIAL FIBRILLATION ABLATION     PVI by [name]  . CARDIAC CATHETERIZATION    . CARDIOVERSION N/A 09/04/2012   Procedure: CARDIOVERSION;  Surgeon: [name], MD;  Location: [facility] ENDOSCOPY;  Service: Cardiovascular;  Laterality: N/A;  . CARDIOVERSION N/A 02/11/2013   Procedure:  CARDIOVERSION;  Surgeon: [name], MD;  Location: [facility] ENDOSCOPY;  Service: Cardiovascular;  Laterality: N/A;  . CARDIOVERSION N/A 12/07/2015   Procedure: CARDIOVERSION;  Surgeon: Chrystie Nose, MD;  Location: [facility] ENDOSCOPY;  Service: Cardiovascular;  Laterality: N/A;  . CARDIOVERSION N/A 03/11/2018   Procedure: CARDIOVERSION;  Surgeon: Lars Masson, MD;  Location: [facility] ENDOSCOPY;  Service: Cardiovascular;  Laterality: N/A;  . CATARACT EXTRACTION, BILATERAL    . EYE SURGERY  2015,2016   bilateral cataract surgery with lens implants  . HERNIA REPAIR  03/2005  . JOINT REPLACEMENT  2005   total right hip replacement  . kidney stent  1987, 02/2005  . KIDNEY STONE SURGERY     2 stents placed  . KNEE ARTHROSCOPY WITH DRILLING/MICROFRACTURE Left 11/20/2013   Procedure: KNEE ARTHROSCOPY WITH DRILLING/MICROFRACTURE;  Surgeon: Jacki Cones, MD;  Location: [facility] ORS;  Service: Orthopedics;  Laterality: Left;  . KNEE ARTHROSCOPY WITH MEDIAL MENISECTOMY Left 11/20/2013   Procedure: LEFT KNEE ARTHROSCOPY WITH MEDIAL MENISECTOMY;  Surgeon: Jacki Cones, MD;  Location: [facility] ORS;  Service: Orthopedics;  Laterality: Left;  Microfracture of medial femoral chondyle and abrasion chondroplasty of the medial femoral chondyle  . LITHOTRIPSY     x 2  . PICC line placement  2007  . quadrec  1999   LFT leg quad sx  . right hip surgery  2007   irrigation and debridement from bacteria  . TEE WITHOUT CARDIOVERSION N/A 12/02/2012   Procedure: TRANSESOPHAGEAL ECHOCARDIOGRAM (TEE);  Surgeon: [name], MD;  Location: [facility] ENDOSCOPY;  Service: Cardiovascular;  Laterality: N/A;  . TOTAL HIP ARTHROPLASTY Left 08/12/2015   Procedure: LEFT TOTAL HIP ARTHROPLASTY;  Surgeon: Ranee Gosselin, MD;  Location: [facility] ORS;  Service: Orthopedics;  Laterality: Left;    Social History   Socioeconomic History  . Marital status: Married    Spouse name: Kathie Rhodes  . Number of children: 2  . Years of education: Not on file  .  Highest education level: Some college, no degree  Occupational History  . Occupation: Recruitment consultant: RETIRED  Tobacco Use  . Smoking status: Never Smoker  . Smokeless tobacco: Never Used  Vaping Use  . Vaping Use: Never used  Substance and Sexual Activity  . Alcohol use: No  . Drug use: No  . Sexual activity: Not on file  Other Topics Concern  . Not on file  Social History Narrative   Lives in [place] with spouse.  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   [employer]:   Has worked extensively in [employer]. Significant asbestos asbestos exposure. No bird or mold exposure.  Right handed   Social Determinants of Health   Financial Resource Strain: Not on file  Food Insecurity: Not on file  Transportation Needs: Not on file  Physical Activity: Not on file  Stress: Not on file  Social Connections: Not on file  Intimate Partner Violence: Not on file    Family History  Problem Relation Age of Onset  . Cancer Mother 66       CANCER  . Heart disease Father 37       HEART PROBLEMS  . Lupus Brother        "of the skin"    ROS: no fevers or chills, productive cough, hemoptysis, dysphasia, odynophagia, melena, hematochezia, dysuria, hematuria, rash, seizure activity, orthopnea, PND, pedal edema, claudication. Remaining systems are negative.  Physical Exam: Well-developed well-nourished in no acute distress.  Skin is warm and dry.  HEENT is normal.  Neck is supple.  Chest is clear to auscultation with normal expansion.  Cardiovascular exam is irregular and tachycardic Abdominal exam nontender or distended. No masses palpated. Extremities show no edema. neuro grossly intact  ECG-atrial fibrillation at a rate of 112, nonspecific ST changes.  Personally reviewed  A/P  1 paroxysmal atrial fibrillation-patient has developed recurrent atrial fibrillation.  He is symptomatic.  We will continue  amiodarone.  Continue edoxaban (he has missed no doses).  Most recent echocardiogram showed normal LV function.  We will arrange repeat elective cardioversion.  2 hypertension-blood pressure controlled.  Continue present medications and follow.  3 history of mitral regurgitation-trivial on most recent echocardiogram.  4 chronic stage III kidney disease  Olga Millers, MD

## 2020-09-17 NOTE — H&P (View-Only) (Signed)
    HPI: FU atrial fibrillation.He has a past history of paroxysmal atrial fibrillation and tachycardia mediated cardiomyopathy. He underwent mapping and ablation of atrial fib in 11/2012 by Dr. Allred. Carotid dopplers April 2017 normal. At previousoffice visit he wanted to try to discontinue amiodarone. However he contacted the office and felt to be back in atrial fibrillation. Amiodarone was resumed. Patient had repeat cardioversion March 11, 2018.Last echocardiogram December 2021 showed normal LV function, grade 2 diastolic dysfunction, moderate left atrial enlargement. Since last seen, patient went into atrial fibrillation approximately 1 week ago.  This is associated with fatigue.  He denies dyspnea on exertion, orthopnea, PND, pedal edema, chest pain or syncope.  Current Outpatient Medications  Medication Sig Dispense Refill  . acetaminophen (TYLENOL) 650 MG CR tablet Take 650 mg by mouth daily as needed for pain.    . amiodarone (PACERONE) 200 MG tablet Take 1 tablet (200 mg total) by mouth daily. 90 tablet 3  . Ascorbic Acid (VITAMIN C) 500 MG tablet Take 1,000 mg by mouth 2 (two) times daily.     . Cholecalciferol (VITAMIN D) 2000 UNITS CAPS Take 2,000 Units by mouth 2 (two) times daily.    . Coenzyme Q10 (COQ10 PO) Take 100 mg by mouth every morning.    . enalapril (VASOTEC) 20 MG tablet TAKE HALF A TABLET BY MOUTH ONCE EVERY EVENING 45 tablet 3  . EQ MAGNESIUM CITRATE PO Take by mouth daily.    . furosemide (LASIX) 40 MG tablet TAKE 1 AND 1/2 TABLET BY MOUTH EVERY MORNING 135 tablet 3  . IRON PO Take 1 tablet by mouth daily at 12 noon.     . levothyroxine (SYNTHROID) 125 MCG tablet TAKE 1 TABLET BY MOUTH ONCE DAILY BEFORE BREAKFAST 90 tablet 3  . Misc Natural Products (OSTEO BI-FLEX JOINT SHIELD PO) Take 2 tablets by mouth every morning.    . Multiple Vitamin (MULTIVITAMIN) tablet Take 1 tablet by mouth daily at 12 noon.    . Omega-3 Fatty Acids (FISH OIL) 1000 MG CAPS  Take 1,000 mg by mouth 2 (two) times daily.    . omeprazole (PRILOSEC) 40 MG capsule Take 1 capsule (40 mg total) by mouth daily. 90 capsule 3  . polyvinyl alcohol (LIQUIFILM TEARS) 1.4 % ophthalmic solution Place 1 drop into both eyes daily as needed for dry eyes.    . [name] 30 MG TABS tablet Take 1 tablet by mouth once daily 90 tablet 3  . zinc gluconate 50 MG tablet Take 50 mg by mouth daily at 12 noon.      No current facility-administered medications for this visit.     Past Medical History:  Diagnosis Date  . Arthritis   . Atrial dilatation    mild biatrial dilitation  . Bruises easily   . CHF (congestive heart failure) (HCC)   . CKD (chronic kidney disease), stage III (HCC)   . Coronary artery disease   . Dysrhythmia    atrial fibrillation  . GERD (gastroesophageal reflux disease)   . H/O hiatal hernia   . Hypercholesterolemia   . Hyperglycemia   . Hypertension   . Hypothyroidism   . Mitral regurgitation    a. Mild by echo in 01/2014.  . Nonischemic cardiomyopathy (HCC)    a. presumed to be tachycardia mediated. Varying EFs over the years from 35-40% to normal and back.  . Paroxysmal atrial fibrillation (HCC)    a. H/o difficult to control. b. s/p PVI ablation 12-03-2012 by   [name] with recurrence afterwards, requiring period of amiodarone.  . Peripheral edema   . Sleep apnea    USES C PAP  . Tear of medial meniscus of left knee   . Tremor     Past Surgical History:  Procedure Laterality Date  . ATRIAL FIBRILLATION ABLATION N/A 12/03/2012   Procedure: ATRIAL FIBRILLATION ABLATION;  Surgeon: Hillis Range, MD;  Location: [facility] CATH LAB;  Service: Cardiovascular;  Laterality: N/A;  . ATRIAL FIBRILLATION ABLATION     PVI by [name]  . CARDIAC CATHETERIZATION    . CARDIOVERSION N/A 09/04/2012   Procedure: CARDIOVERSION;  Surgeon: [name], MD;  Location: [facility] ENDOSCOPY;  Service: Cardiovascular;  Laterality: N/A;  . CARDIOVERSION N/A 02/11/2013   Procedure:  CARDIOVERSION;  Surgeon: [name], MD;  Location: [facility] ENDOSCOPY;  Service: Cardiovascular;  Laterality: N/A;  . CARDIOVERSION N/A 12/07/2015   Procedure: CARDIOVERSION;  Surgeon: Chrystie Nose, MD;  Location: [facility] ENDOSCOPY;  Service: Cardiovascular;  Laterality: N/A;  . CARDIOVERSION N/A 03/11/2018   Procedure: CARDIOVERSION;  Surgeon: Lars Masson, MD;  Location: [facility] ENDOSCOPY;  Service: Cardiovascular;  Laterality: N/A;  . CATARACT EXTRACTION, BILATERAL    . EYE SURGERY  2015,2016   bilateral cataract surgery with lens implants  . HERNIA REPAIR  03/2005  . JOINT REPLACEMENT  2005   total right hip replacement  . kidney stent  1987, 02/2005  . KIDNEY STONE SURGERY     2 stents placed  . KNEE ARTHROSCOPY WITH DRILLING/MICROFRACTURE Left 11/20/2013   Procedure: KNEE ARTHROSCOPY WITH DRILLING/MICROFRACTURE;  Surgeon: Jacki Cones, MD;  Location: [facility] ORS;  Service: Orthopedics;  Laterality: Left;  . KNEE ARTHROSCOPY WITH MEDIAL MENISECTOMY Left 11/20/2013   Procedure: LEFT KNEE ARTHROSCOPY WITH MEDIAL MENISECTOMY;  Surgeon: Jacki Cones, MD;  Location: [facility] ORS;  Service: Orthopedics;  Laterality: Left;  Microfracture of medial femoral chondyle and abrasion chondroplasty of the medial femoral chondyle  . LITHOTRIPSY     x 2  . PICC line placement  2007  . quadrec  1999   LFT leg quad sx  . right hip surgery  2007   irrigation and debridement from bacteria  . TEE WITHOUT CARDIOVERSION N/A 12/02/2012   Procedure: TRANSESOPHAGEAL ECHOCARDIOGRAM (TEE);  Surgeon: [name], MD;  Location: [facility] ENDOSCOPY;  Service: Cardiovascular;  Laterality: N/A;  . TOTAL HIP ARTHROPLASTY Left 08/12/2015   Procedure: LEFT TOTAL HIP ARTHROPLASTY;  Surgeon: Ranee Gosselin, MD;  Location: [facility] ORS;  Service: Orthopedics;  Laterality: Left;    Social History   Socioeconomic History  . Marital status: Married    Spouse name: Kathie Rhodes  . Number of children: 2  . Years of education: Not on file  .  Highest education level: Some college, no degree  Occupational History  . Occupation: Recruitment consultant: RETIRED  Tobacco Use  . Smoking status: Never Smoker  . Smokeless tobacco: Never Used  Vaping Use  . Vaping Use: Never used  Substance and Sexual Activity  . Alcohol use: No  . Drug use: No  . Sexual activity: Not on file  Other Topics Concern  . Not on file  Social History Narrative   Lives in [place] with spouse.  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   [employer]:   Has worked extensively in [employer]. Significant asbestos asbestos exposure. No bird or mold exposure.  Right handed   Social Determinants of Health   Financial Resource Strain: Not on file  Food Insecurity: Not on file  Transportation Needs: Not on file  Physical Activity: Not on file  Stress: Not on file  Social Connections: Not on file  Intimate Partner Violence: Not on file    Family History  Problem Relation Age of Onset  . Cancer Mother 66       CANCER  . Heart disease Father 37       HEART PROBLEMS  . Lupus Brother        "of the skin"    ROS: no fevers or chills, productive cough, hemoptysis, dysphasia, odynophagia, melena, hematochezia, dysuria, hematuria, rash, seizure activity, orthopnea, PND, pedal edema, claudication. Remaining systems are negative.  Physical Exam: Well-developed well-nourished in no acute distress.  Skin is warm and dry.  HEENT is normal.  Neck is supple.  Chest is clear to auscultation with normal expansion.  Cardiovascular exam is irregular and tachycardic Abdominal exam nontender or distended. No masses palpated. Extremities show no edema. neuro grossly intact  ECG-atrial fibrillation at a rate of 112, nonspecific ST changes.  Personally reviewed  A/P  1 paroxysmal atrial fibrillation-patient has developed recurrent atrial fibrillation.  He is symptomatic.  We will continue  amiodarone.  Continue edoxaban (he has missed no doses).  Most recent echocardiogram showed normal LV function.  We will arrange repeat elective cardioversion.  2 hypertension-blood pressure controlled.  Continue present medications and follow.  3 history of mitral regurgitation-trivial on most recent echocardiogram.  4 chronic stage III kidney disease  Olga Millers, MD

## 2020-09-20 ENCOUNTER — Other Ambulatory Visit (HOSPITAL_COMMUNITY)
Admission: RE | Admit: 2020-09-20 | Discharge: 2020-09-20 | Disposition: A | Discharge status: Home / Self Care | Payer: Medicare Other | Source: Ambulatory Visit | Attending: Internal Medicine | Admitting: Internal Medicine

## 2020-09-20 ENCOUNTER — Encounter: Payer: Self-pay | Admitting: Cardiology

## 2020-09-20 ENCOUNTER — Other Ambulatory Visit: Payer: Self-pay | Admitting: *Deleted

## 2020-09-20 ENCOUNTER — Other Ambulatory Visit: Payer: Self-pay

## 2020-09-20 ENCOUNTER — Ambulatory Visit: Payer: Medicare Other | Admitting: Cardiology

## 2020-09-20 VITALS — BP 114/70 | HR 112 | Ht 70.0 in | Wt 186.0 lb

## 2020-09-20 DIAGNOSIS — N1832 Chronic kidney disease, stage 3b: Secondary | ICD-10-CM

## 2020-09-20 DIAGNOSIS — I10 Essential (primary) hypertension: Secondary | ICD-10-CM

## 2020-09-20 DIAGNOSIS — Z20822 Contact with and (suspected) exposure to covid-19: Secondary | ICD-10-CM | POA: Insufficient documentation

## 2020-09-20 DIAGNOSIS — I480 Paroxysmal atrial fibrillation: Secondary | ICD-10-CM

## 2020-09-20 DIAGNOSIS — Z01812 Encounter for preprocedural laboratory examination: Secondary | ICD-10-CM | POA: Insufficient documentation

## 2020-09-20 NOTE — Patient Instructions (Addendum)
  Lab Work:  Your physician recommends that you HAVE LAB WORK TODAY  If you have labs (blood work) drawn today and your tests are completely normal, you will receive your results only by: Marland Kitchen MyChart Message (if you have MyChart) OR . A paper copy in the mail If you have any lab test that is abnormal or we need to change your treatment, we will call you to review the results.   Testing/Procedures:  GO TO [address] TODAY AT 2:10 PM FOR COVID TESTING  You are scheduled for a  Cardioversion on Thursday 09/23/20 with Dr. Tenny Craw.  Please arrive at the [facility] (Main Entrance A) at [facility]: [address] [place], [place] [address] at 8 am. (1 hour prior to procedure unless lab work is needed; if lab work is needed arrive 1.5 hours ahead)  DIET: Nothing to eat or drink after midnight except a sip of water with medications (see medication instructions below)  Medication Instructions:  Continue your anticoagulant: [name]   You must have a responsible person to drive you home and stay in the waiting area during your procedure. Failure to do so could result in cancellation.  Bring your insurance cards.  *Special Note: Every effort is made to have your procedure done on time. Occasionally there are emergencies that occur at the hospital that may cause delays. Please be patient if a delay does occur.    Follow-Up: At [facility], you and your health needs are our priority.  As part of our continuing mission to provide you with exceptional heart care, we have created designated Provider Care Teams.  These Care Teams include your primary Cardiologist (physician) and Advanced Practice Providers (APPs -  Physician Assistants and Nurse Practitioners) who all work together to provide you with the care you need, when you need it.  We recommend signing up for the patient portal called "MyChart".  Sign up information is provided on this After Visit Summary.  MyChart is used to  connect with patients for Virtual Visits (Telemedicine).  Patients are able to view lab/test results, encounter notes, upcoming appointments, etc.  Non-urgent messages can be sent to your provider as well.   To learn more about what you can do with MyChart, go to [email or web address].    Your next appointment:   3 month(s)  The format for your next appointment:   In Person  Provider:   Olga Millers, MD

## 2020-09-21 LAB — CBC
Hematocrit: 36.2 % — ABNORMAL LOW (ref 37.5–51.0)
Hemoglobin: 12.6 g/dL — ABNORMAL LOW (ref 13.0–17.7)
MCH: 31.4 pg (ref 26.6–33.0)
MCHC: 34.8 g/dL (ref 31.5–35.7)
MCV: 90 fL (ref 79–97)
Platelets: 243 10*3/uL (ref 150–450)
RBC: 4.01 x10E6/uL — ABNORMAL LOW (ref 4.14–5.80)
RDW: 11.3 % — ABNORMAL LOW (ref 11.6–15.4)
WBC: 11.7 10*3/uL — ABNORMAL HIGH (ref 3.4–10.8)

## 2020-09-21 LAB — BASIC METABOLIC PANEL
BUN/Creatinine Ratio: 18 (ref 10–24)
BUN: 37 mg/dL — ABNORMAL HIGH (ref 8–27)
CO2: 19 mmol/L — ABNORMAL LOW (ref 20–29)
Calcium: 10.5 mg/dL — ABNORMAL HIGH (ref 8.6–10.2)
Chloride: 99 mmol/L (ref 96–106)
Creatinine, Ser: 2.01 mg/dL — ABNORMAL HIGH (ref 0.76–1.27)
Glucose: 118 mg/dL — ABNORMAL HIGH (ref 65–99)
Potassium: 5 mmol/L (ref 3.5–5.2)
Sodium: 139 mmol/L (ref 134–144)
eGFR: 33 mL/min/{1.73_m2} — ABNORMAL LOW (ref >59)

## 2020-09-21 LAB — TSH: TSH: 1.41 u[IU]/mL (ref 0.450–4.500)

## 2020-09-21 LAB — SARS CORONAVIRUS 2 (TAT 6-24 HRS): SARS Coronavirus 2: NEGATIVE

## 2020-09-23 ENCOUNTER — Ambulatory Visit (HOSPITAL_COMMUNITY): Payer: Medicare Other | Admitting: Anesthesiology

## 2020-09-23 ENCOUNTER — Encounter (HOSPITAL_COMMUNITY)
Admission: RE | Disposition: A | Discharge status: Home / Self Care | Payer: Self-pay | Source: Ambulatory Visit | Attending: Internal Medicine

## 2020-09-23 ENCOUNTER — Encounter (HOSPITAL_COMMUNITY): Payer: Self-pay | Admitting: Internal Medicine

## 2020-09-23 ENCOUNTER — Other Ambulatory Visit: Payer: Self-pay

## 2020-09-23 ENCOUNTER — Ambulatory Visit (HOSPITAL_COMMUNITY)
Admission: RE | Admit: 2020-09-23 | Discharge: 2020-09-23 | Disposition: A | Discharge status: Home / Self Care | Payer: Medicare Other | Source: Ambulatory Visit | Attending: Internal Medicine | Admitting: Internal Medicine

## 2020-09-23 DIAGNOSIS — I509 Heart failure, unspecified: Secondary | ICD-10-CM | POA: Diagnosis not present

## 2020-09-23 DIAGNOSIS — I340 Nonrheumatic mitral (valve) insufficiency: Secondary | ICD-10-CM | POA: Diagnosis not present

## 2020-09-23 DIAGNOSIS — I480 Paroxysmal atrial fibrillation: Secondary | ICD-10-CM | POA: Diagnosis not present

## 2020-09-23 DIAGNOSIS — Z79899 Other long term (current) drug therapy: Secondary | ICD-10-CM | POA: Diagnosis not present

## 2020-09-23 DIAGNOSIS — Z7901 Long term (current) use of anticoagulants: Secondary | ICD-10-CM | POA: Diagnosis not present

## 2020-09-23 DIAGNOSIS — I4819 Other persistent atrial fibrillation: Secondary | ICD-10-CM | POA: Diagnosis not present

## 2020-09-23 DIAGNOSIS — I429 Cardiomyopathy, unspecified: Secondary | ICD-10-CM | POA: Insufficient documentation

## 2020-09-23 DIAGNOSIS — Z79890 Hormone replacement therapy (postmenopausal): Secondary | ICD-10-CM | POA: Diagnosis not present

## 2020-09-23 DIAGNOSIS — N183 Chronic kidney disease, stage 3 unspecified: Secondary | ICD-10-CM | POA: Insufficient documentation

## 2020-09-23 DIAGNOSIS — I130 Hypertensive heart and chronic kidney disease with heart failure and stage 1 through stage 4 chronic kidney disease, or unspecified chronic kidney disease: Secondary | ICD-10-CM | POA: Diagnosis not present

## 2020-09-23 DIAGNOSIS — I4891 Unspecified atrial fibrillation: Secondary | ICD-10-CM | POA: Diagnosis not present

## 2020-09-23 HISTORY — PX: CARDIOVERSION: SHX1299

## 2020-09-23 SURGERY — CARDIOVERSION
Anesthesia: General

## 2020-09-23 MED ORDER — PROPOFOL 10 MG/ML IV BOLUS
INTRAVENOUS | Status: DC | PRN
  Administered 2020-09-23: 50 mg via INTRAVENOUS

## 2020-09-23 MED ORDER — SODIUM CHLORIDE 0.9 % IV SOLN: INTRAVENOUS | Status: DC | PRN

## 2020-09-23 MED ORDER — LIDOCAINE 2% (20 MG/ML) 5 ML SYRINGE
INTRAMUSCULAR | Status: DC | PRN
  Administered 2020-09-23: 50 mg via INTRAVENOUS

## 2020-09-23 NOTE — Interval H&P Note (Signed)
History and Physical Interval Note:  09/23/2020 8:31 AM  Charles [name]  has presented today for surgery, with the diagnosis of A-FIB.  The various methods of treatment have been discussed with the patient and family. After consideration of risks, benefits and other options for treatment, the patient has consented to  Procedure(s): CARDIOVERSION (N/A) as a surgical intervention.  The patient's history has been reviewed, patient examined, no change in status, stable for surgery.  I have reviewed the patient's chart and labs.  Questions were answered to the patient's satisfaction.     [name]

## 2020-09-23 NOTE — Transfer of Care (Signed)
Immediate Anesthesia Transfer of Care Note  Patient: Charles [name]  Procedure(s) Performed: CARDIOVERSION (N/A )  Patient Location: Endoscopy Unit  Anesthesia Type:General  Level of Consciousness: drowsy  Airway & Oxygen Therapy: Patient Spontanous Breathing  Post-op Assessment: Report given to RN and Post -op Vital signs reviewed and stable  Post vital signs: Reviewed and stable  Last Vitals:  Vitals Value Taken Time  BP    Temp    Pulse    Resp    SpO2      Last Pain:  Vitals:   09/23/20 0810  TempSrc: Oral  PainSc: 0-No pain         Complications: No complications documented.

## 2020-09-23 NOTE — CV Procedure (Signed)
CARDIOVERSION  Pt sedated by anesthesia with 50 mg lidocaine and 50 mg Propofol intravenously  With pads in AP position, patient cardioverted to SR with 200 J synchronized biphasic energy  Procedure was without complication  12 lead EKG pending  [name] MD

## 2020-09-23 NOTE — Anesthesia Procedure Notes (Signed)
Procedure Name: General with mask airway Date/Time: 09/23/2020 8:37 AM Performed by: Elliot Dally, CRNA Pre-anesthesia Checklist: Patient identified, Emergency Drugs available, Suction available, Patient being monitored and Timeout performed Patient Re-evaluated:Patient Re-evaluated prior to induction Oxygen Delivery Method: Ambu bag Preoxygenation: Pre-oxygenation with 100% oxygen

## 2020-09-23 NOTE — Discharge Instructions (Signed)
Electrical Cardioversion Electrical cardioversion is the delivery of a jolt of electricity to restore a normal rhythm to the heart. A rhythm that is too fast or is not regular keeps the heart from pumping well. In this procedure, sticky patches or metal paddles are placed on the chest to deliver electricity to the heart from a device. This procedure may be done in an emergency if:  There is low or no blood pressure as a result of the heart rhythm.  Normal rhythm must be restored as fast as possible to protect the brain and heart from further damage.  It may save a life. This may also be a scheduled procedure for irregular or fast heart rhythms that are not immediately life-threatening. Tell a health care provider about:  Any allergies you have.  All medicines you are taking, including vitamins, herbs, eye drops, creams, and over-the-counter medicines.  Any problems you or family members have had with anesthetic medicines.  Any blood disorders you have.  Any surgeries you have had.  Any medical conditions you have.  Whether you are pregnant or may be pregnant. What are the risks? Generally, this is a safe procedure. However, problems may occur, including:  Allergic reactions to medicines.  A blood clot that breaks free and travels to other parts of your body.  The possible return of an abnormal heart rhythm within hours or days after the procedure.  Your heart stopping (cardiac arrest). This is rare. What happens before the procedure? Medicines  Your health care provider may have you start taking: ? Blood-thinning medicines (anticoagulants) so your blood does not clot as easily. ? Medicines to help stabilize your heart rate and rhythm.  Ask your health care provider about: ? Changing or stopping your regular medicines. This is especially important if you are taking diabetes medicines or blood thinners. ? Taking medicines such as aspirin and ibuprofen. These medicines can  thin your blood. Do not take these medicines unless your health care provider tells you to take them. ? Taking over-the-counter medicines, vitamins, herbs, and supplements. General instructions  Follow instructions from your health care provider about eating or drinking restrictions.  Plan to have someone take you home from the hospital or clinic.  If you will be going home right after the procedure, plan to have someone with you for 24 hours.  Ask your health care provider what steps will be taken to help prevent infection. These may include washing your skin with a germ-killing soap. What happens during the procedure?  An IV will be inserted into one of your veins.  Sticky patches (electrodes) or metal paddles may be placed on your chest.  You will be given a medicine to help you relax (sedative).  An electrical shock will be delivered. The procedure may vary among health care providers and hospitals.   What can I expect after the procedure?  Your blood pressure, heart rate, breathing rate, and blood oxygen level will be monitored until you leave the hospital or clinic.  Your heart rhythm will be watched to make sure it does not change.  You may have some redness on the skin where the shocks were given. Follow these instructions at home:  Do not drive for 24 hours if you were given a sedative during your procedure.  Take over-the-counter and prescription medicines only as told by your health care provider.  Ask your health care provider how to check your pulse. Check it often.  Rest for 48 hours after the procedure   or as told by your health care provider.  Avoid or limit your caffeine use as told by your health care provider.  Keep all follow-up visits as told by your health care provider. This is important. Contact a health care provider if:  You feel like your heart is beating too quickly or your pulse is not regular.  You have a serious muscle cramp that does not go  away. Get help right away if:  You have discomfort in your chest.  You are dizzy or you feel faint.  You have trouble breathing or you are short of breath.  Your speech is slurred.  You have trouble moving an arm or leg on one side of your body.  Your fingers or toes turn cold or blue. Summary  Electrical cardioversion is the delivery of a jolt of electricity to restore a normal rhythm to the heart.  This procedure may be done right away in an emergency or may be a scheduled procedure if the condition is not an emergency.  Generally, this is a safe procedure.  After the procedure, check your pulse often as told by your health care provider. This information is not intended to replace advice given to you by your health care provider. Make sure you discuss any questions you have with your health care provider. Document Revised: 01/13/2019 Document Reviewed: 01/13/2019 Elsevier Patient Education  2021 Elsevier Inc.  

## 2020-09-23 NOTE — Anesthesia Postprocedure Evaluation (Signed)
Anesthesia Post Note  Patient: Charles [name]  Procedure(s) Performed: CARDIOVERSION (N/A )     Patient location during evaluation: PACU Anesthesia Type: General Level of consciousness: awake and alert and oriented Pain management: pain level controlled Vital Signs Assessment: post-procedure vital signs reviewed and stable Respiratory status: spontaneous breathing, nonlabored ventilation and respiratory function stable Cardiovascular status: blood pressure returned to baseline and stable Postop Assessment: no apparent nausea or vomiting Anesthetic complications: no   No complications documented.  Last Vitals:  Vitals:   09/23/20 0810 09/23/20 0845  BP: 113/71 (!) 86/49  Pulse:  (!) 58  Resp: 17 18  Temp: 36.8 C   SpO2: 100% 94%    Last Pain:  Vitals:   09/23/20 0845  TempSrc:   PainSc: 0-No pain                 Emmerich Cryer A.

## 2020-09-23 NOTE — Anesthesia Preprocedure Evaluation (Signed)
Anesthesia Evaluation  Patient identified by MRN, date of birth, ID band Patient awake    Reviewed: Allergy & Precautions, NPO status , Patient's Chart, lab work & pertinent test results, reviewed documented beta blocker date and time   Airway Mallampati: I  TM Distance: >3 FB Neck ROM: Full    Dental  (+) Caps, Dental Advisory Given, Missing,    Pulmonary shortness of breath and with exertion, sleep apnea ,    Pulmonary exam normal breath sounds clear to auscultation       Cardiovascular hypertension, Pt. on medications + CAD, +CHF and + Orthopnea  + dysrhythmias Atrial Fibrillation  Rhythm:Irregular Rate:Normal     Neuro/Psych negative neurological ROS  negative psych ROS   GI/Hepatic hiatal hernia, GERD  Medicated and Controlled,  Endo/Other  Hypothyroidism   Renal/GU Renal InsufficiencyRenal disease  negative genitourinary   Musculoskeletal  (+) Arthritis , Osteoarthritis,    Abdominal   Peds  Hematology [name] therapy- last dose yesterday pm   Anesthesia Other Findings   Reproductive/Obstetrics                             Anesthesia Physical Anesthesia Plan  ASA: III  Anesthesia Plan: General   Post-op Pain Management:    Induction: Intravenous  PONV Risk Score and Plan: 2 and Treatment may vary due to age or medical condition  Airway Management Planned: Mask and Natural Airway  Additional Equipment:   Intra-op Plan:   Post-operative Plan:   Informed Consent: I have reviewed the patients History and Physical, chart, labs and discussed the procedure including the risks, benefits and alternatives for the proposed anesthesia with the patient or authorized representative who has indicated his/her understanding and acceptance.     Dental advisory given  Plan Discussed with: CRNA and Anesthesiologist  Anesthesia Plan Comments:         Anesthesia Quick  Evaluation

## 2020-09-24 ENCOUNTER — Encounter (HOSPITAL_COMMUNITY): Payer: Self-pay | Admitting: Internal Medicine

## 2020-10-18 NOTE — Progress Notes (Signed)
HPI: FU atrial fibrillation.He has a past history of paroxysmal atrial fibrillation and tachycardia mediated cardiomyopathy. He underwent mapping and ablation of atrial fib in 11/2012 by Dr. [name]. Carotid dopplers April 2017 normal. At previousoffice visit he wanted to try to discontinue amiodarone. However he contacted the office and felt to be back in atrial fibrillation. Amiodarone was resumed. Last echocardiogram December 2021 showed normal LV function, grade 2 diastolic dysfunction, moderate left atrial enlargement.  Patient had successful cardioversion September 23, 2020.  Since last seen,  he is symptomatically improved.  He denies dyspnea, chest pain, palpitations or syncope.  No bleeding.  Current Outpatient Medications  Medication Sig Dispense Refill  . acetaminophen (TYLENOL) 650 MG CR tablet Take 650 mg by mouth daily as needed for pain.    Marland Kitchen amiodarone (PACERONE) 200 MG tablet Take 1 tablet (200 mg total) by mouth daily. 90 tablet 3  . Ascorbic Acid (VITAMIN C) 500 MG tablet Take 1,000 mg by mouth 2 (two) times daily.     . Cholecalciferol (VITAMIN D) 125 MCG (5000 UT) CAPS Take 5,000 Units by mouth daily.    . Coenzyme Q10 (COQ10) 100 MG CAPS Take 100 mg by mouth every morning.    . enalapril (VASOTEC) 20 MG tablet TAKE HALF A TABLET BY MOUTH ONCE EVERY EVENING (Patient taking differently: Take by mouth daily. 0.5  Tablet at Bedtime) 45 tablet 3  . EQ MAGNESIUM CITRATE PO Take 420 mg by mouth daily.    . furosemide (LASIX) 40 MG tablet TAKE 1 AND 1/2 TABLET BY MOUTH EVERY MORNING (Patient taking differently: Take 40 mg by mouth daily. TAKE 1 TABLET BY MOUTH EVERY MORNING) 135 tablet 3  . IRON PO Take 1 tablet by mouth daily at 12 noon.     Marland Kitchen levothyroxine (SYNTHROID) 125 MCG tablet TAKE 1 TABLET BY MOUTH ONCE DAILY BEFORE BREAKFAST (Patient taking differently: Take 125 mcg by mouth daily before breakfast.) 90 tablet 3  . Misc Natural Products (OSTEO BI-FLEX JOINT SHIELD PO) Take  2 tablets by mouth every morning.    . Multiple Vitamin (MULTIVITAMIN) tablet Take 1 tablet by mouth daily at 12 noon.    . Omega-3 Fatty Acids (FISH OIL) 1000 MG CAPS Take 1,000 mg by mouth 2 (two) times daily.    Marland Kitchen omeprazole (PRILOSEC) 40 MG capsule Take 1 capsule (40 mg total) by mouth daily. 90 capsule 3  . polyvinyl alcohol (LIQUIFILM TEARS) 1.4 % ophthalmic solution Place 1 drop into both eyes daily as needed for dry eyes.    Marland Kitchen [name] 30 MG TABS tablet Take 1 tablet by mouth once daily (Patient taking differently: Take 30 mg by mouth daily.) 90 tablet 3  . zinc gluconate 50 MG tablet Take 50 mg by mouth daily at 12 noon.      No current facility-administered medications for this visit.     Past Medical History:  Diagnosis Date  . Arthritis   . Atrial dilatation    mild biatrial dilitation  . Bruises easily   . CHF (congestive heart failure) (HCC)   . CKD (chronic kidney disease), stage III (HCC)   . Coronary artery disease   . Dysrhythmia    atrial fibrillation  . GERD (gastroesophageal reflux disease)   . H/O hiatal hernia   . Hypercholesterolemia   . Hyperglycemia   . Hypertension   . Hypothyroidism   . Mitral regurgitation    a. Mild by echo in 01/2014.  Marland Kitchen Nonischemic  cardiomyopathy (HCC)    a. presumed to be tachycardia mediated. Varying EFs over the years from 35-40% to normal and back.  . Paroxysmal atrial fibrillation (HCC)    a. H/o difficult to control. b. s/p PVI ablation 12-03-2012 by [name] with recurrence afterwards, requiring period of amiodarone.  . Peripheral edema   . Sleep apnea    USES C PAP  . Tear of medial meniscus of left knee   . Tremor     Past Surgical History:  Procedure Laterality Date  . ATRIAL FIBRILLATION ABLATION N/A 12/03/2012   Procedure: ATRIAL FIBRILLATION ABLATION;  Surgeon: Hillis Range, MD;  Location: [facility] CATH LAB;  Service: Cardiovascular;  Laterality: N/A;  . ATRIAL FIBRILLATION ABLATION     PVI by [name]  . CARDIAC  CATHETERIZATION    . CARDIOVERSION N/A 09/04/2012   Procedure: CARDIOVERSION;  Surgeon: [name], MD;  Location: [facility] ENDOSCOPY;  Service: Cardiovascular;  Laterality: N/A;  . CARDIOVERSION N/A 02/11/2013   Procedure: CARDIOVERSION;  Surgeon: [name], MD;  Location: [facility] ENDOSCOPY;  Service: Cardiovascular;  Laterality: N/A;  . CARDIOVERSION N/A 12/07/2015   Procedure: CARDIOVERSION;  Surgeon: Chrystie Nose, MD;  Location: [facility] ENDOSCOPY;  Service: Cardiovascular;  Laterality: N/A;  . CARDIOVERSION N/A 03/11/2018   Procedure: CARDIOVERSION;  Surgeon: Lars Masson, MD;  Location: [facility] ENDOSCOPY;  Service: Cardiovascular;  Laterality: N/A;  . CARDIOVERSION N/A 09/23/2020   Procedure: CARDIOVERSION;  Surgeon: [name], MD;  Location: [facility] ENDOSCOPY;  Service: Cardiovascular;  Laterality: N/A;  . CATARACT EXTRACTION, BILATERAL    . EYE SURGERY  2015,2016   bilateral cataract surgery with lens implants  . HERNIA REPAIR  03/2005  . JOINT REPLACEMENT  2005   total right hip replacement  . kidney stent  1987, 02/2005  . KIDNEY STONE SURGERY     2 stents placed  . KNEE ARTHROSCOPY WITH DRILLING/MICROFRACTURE Left 11/20/2013   Procedure: KNEE ARTHROSCOPY WITH DRILLING/MICROFRACTURE;  Surgeon: Jacki Cones, MD;  Location: [facility] ORS;  Service: Orthopedics;  Laterality: Left;  . KNEE ARTHROSCOPY WITH MEDIAL MENISECTOMY Left 11/20/2013   Procedure: LEFT KNEE ARTHROSCOPY WITH MEDIAL MENISECTOMY;  Surgeon: Jacki Cones, MD;  Location: [facility] ORS;  Service: Orthopedics;  Laterality: Left;  Microfracture of medial femoral chondyle and abrasion chondroplasty of the medial femoral chondyle  . LITHOTRIPSY     x 2  . PICC line placement  2007  . quadrec  1999   LFT leg quad sx  . right hip surgery  2007   irrigation and debridement from bacteria  . TEE WITHOUT CARDIOVERSION N/A 12/02/2012   Procedure: TRANSESOPHAGEAL ECHOCARDIOGRAM (TEE);  Surgeon: [name], MD;  Location: [facility] ENDOSCOPY;   Service: Cardiovascular;  Laterality: N/A;  . TOTAL HIP ARTHROPLASTY Left 08/12/2015   Procedure: LEFT TOTAL HIP ARTHROPLASTY;  Surgeon: Ranee Gosselin, MD;  Location: [facility];  Service: Orthopedics;  Laterality: Left;    Social History   Socioeconomic History  . Marital status: Married    Spouse name: Kathie Rhodes  . Number of children: 2  . Years of education: Not on file  . Highest education level: Some college, no degree  Occupational History  . Occupation: Recruitment consultant: RETIRED  Tobacco Use  . Smoking status: Never Smoker  . Smokeless tobacco: Never Used  Vaping Use  . Vaping Use: Never used  Substance and Sexual Activity  . Alcohol use: No  . Drug use: No  . Sexual activity: Not on  file  Other Topics Concern  . Not on file  Social History Narrative   Lives in [place] with spouse.  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   [employer]:   Has worked extensively in [employer]. Significant asbestos asbestos exposure. No bird or mold exposure.       Right handed   Social Determinants of Health   Financial Resource Strain: Not on file  Food Insecurity: Not on file  Transportation Needs: Not on file  Physical Activity: Not on file  Stress: Not on file  Social Connections: Not on file  Intimate Partner Violence: Not on file    Family History  Problem Relation Age of Onset  . Cancer Mother 39       CANCER  . Heart disease Father 68       HEART PROBLEMS  . Lupus Brother        "of the skin"    ROS: no fevers or chills, productive cough, hemoptysis, dysphasia, odynophagia, melena, hematochezia, dysuria, hematuria, rash, seizure activity, orthopnea, PND, pedal edema, claudication. Remaining systems are negative.  Physical Exam: Well-developed well-nourished in no acute distress.  Skin is warm and dry.  HEENT is normal.  Neck is supple.  Chest is clear to auscultation with normal expansion.   Cardiovascular exam is regular rate and rhythm.  Abdominal exam nontender or distended. No masses palpated. Extremities show no edema. neuro grossly intact  ECG-sinus bradycardia at a rate of 54, normal axis, nonspecific ST changes.  Personally reviewed  A/P  1 paroxysmal atrial fibrillation-patient remains in sinus rhythm today.  Continue amiodarone at present dose.  Continue edoxaban.  2 hypertension-patient's blood pressure is controlled.  Continue present medications and follow.  3 history of mitral regurgitation-trivial on most recent echocardiogram.  4 chronic stage III kidney disease  Olga Millers, MD

## 2020-10-26 ENCOUNTER — Encounter: Payer: Self-pay | Admitting: Cardiology

## 2020-10-26 ENCOUNTER — Other Ambulatory Visit: Payer: Self-pay

## 2020-10-26 ENCOUNTER — Ambulatory Visit: Payer: Medicare Other | Admitting: Cardiology

## 2020-10-26 VITALS — BP 138/72 | HR 54 | Ht 70.0 in | Wt 189.2 lb

## 2020-10-26 DIAGNOSIS — I10 Essential (primary) hypertension: Secondary | ICD-10-CM

## 2020-10-26 DIAGNOSIS — N1832 Chronic kidney disease, stage 3b: Secondary | ICD-10-CM

## 2020-10-26 DIAGNOSIS — I4891 Unspecified atrial fibrillation: Secondary | ICD-10-CM | POA: Diagnosis not present

## 2020-10-26 DIAGNOSIS — I480 Paroxysmal atrial fibrillation: Secondary | ICD-10-CM

## 2020-10-26 MED ORDER — EDOXABAN TOSYLATE 60 MG PO TABS: 60.0000 mg | ORAL_TABLET | Freq: Every day | ORAL | 3 refills | Status: DC

## 2020-10-26 NOTE — Patient Instructions (Signed)

## 2020-11-01 NOTE — Addendum Note (Signed)
Addended by: Myna Hidalgo A on: 11/01/2020 02:43 PM   Modules accepted: Orders

## 2020-12-01 ENCOUNTER — Other Ambulatory Visit: Payer: Self-pay | Admitting: Cardiology

## 2020-12-10 ENCOUNTER — Other Ambulatory Visit: Payer: Self-pay | Admitting: Cardiology

## 2020-12-10 DIAGNOSIS — I4819 Other persistent atrial fibrillation: Secondary | ICD-10-CM

## 2021-02-11 ENCOUNTER — Other Ambulatory Visit: Payer: Self-pay | Admitting: Cardiology

## 2021-03-20 ENCOUNTER — Other Ambulatory Visit: Payer: Self-pay | Admitting: Cardiology

## 2021-03-20 DIAGNOSIS — E039 Hypothyroidism, unspecified: Secondary | ICD-10-CM

## 2021-03-28 ENCOUNTER — Other Ambulatory Visit: Payer: Self-pay | Admitting: Cardiology

## 2021-03-28 DIAGNOSIS — E039 Hypothyroidism, unspecified: Secondary | ICD-10-CM

## 2021-03-29 MED ORDER — LEVOTHYROXINE SODIUM 125 MCG PO TABS: 125.0000 ug | ORAL_TABLET | Freq: Every day | ORAL | 3 refills | Status: DC

## 2021-03-29 NOTE — Addendum Note (Signed)
Addended by: Freddi Starr on: 03/29/2021 08:43 AM   Modules accepted: Orders

## 2021-06-03 ENCOUNTER — Other Ambulatory Visit: Payer: Self-pay | Admitting: Cardiology

## 2021-07-05 ENCOUNTER — Telehealth: Payer: Self-pay | Admitting: Cardiology

## 2021-07-05 DIAGNOSIS — E039 Hypothyroidism, unspecified: Secondary | ICD-10-CM

## 2021-07-05 DIAGNOSIS — I4891 Unspecified atrial fibrillation: Secondary | ICD-10-CM

## 2021-07-05 MED ORDER — EDOXABAN TOSYLATE 60 MG PO TABS: 60.0000 mg | ORAL_TABLET | Freq: Every day | ORAL | 3 refills | Status: DC

## 2021-07-05 MED ORDER — LEVOTHYROXINE SODIUM 125 MCG PO TABS: 125.0000 ug | ORAL_TABLET | Freq: Every day | ORAL | 3 refills | Status: DC

## 2021-07-05 NOTE — Telephone Encounter (Signed)
°*  STAT* If patient is at the pharmacy, call can be transferred to refill team.   1. Which medications need to be refilled? (please list name of each medication and dose if known) new prescription, changing pharmacy- Levothyroxine- The manufacturer should be [employer] also  Larkin Ina goes to [facility] on [address], [place]  2. Which pharmacy/location (including street and city if local pharmacy) is medication to be sent to?  Levothyroxine goes to [facility]  3. Do they need a 30 day or 90 day supply? 90 days and refills for both prescriptions

## 2021-07-06 ENCOUNTER — Telehealth: Payer: Self-pay | Admitting: Cardiology

## 2021-07-06 MED ORDER — LEVOTHYROXINE SODIUM 125 MCG PO TABS: 125.0000 ug | ORAL_TABLET | Freq: Every day | ORAL | 3 refills | Status: DC

## 2021-07-06 MED ORDER — EDOXABAN TOSYLATE 60 MG PO TABS: 60.0000 mg | ORAL_TABLET | Freq: Every day | ORAL | 3 refills | Status: DC

## 2021-07-06 NOTE — Telephone Encounter (Signed)
Called pharmacy [facility] [address] [place] : [phone]). Spoke with staff than she said the medication was being processed another location. ([address]: [phone]). Will contact pt to see which pharmacy he would like the medication to sent to.

## 2021-07-06 NOTE — Telephone Encounter (Signed)
Called pt to clarify when pharmacy he would like his prescription sent to. He was not sure. He asked me to call his wife to make everything simpler.

## 2021-07-06 NOTE — Telephone Encounter (Signed)
Called pt's wife and went over which pharmacy she would like the medications sent to. She states they are using [facility] on [address] and [facility] on [address]. The medications were sent to the corrected pharmacy.  °

## 2021-07-06 NOTE — Telephone Encounter (Signed)
They wants to know if Dr. Jens Som want generic or Brand name. levothyroxine (SYNTHROID) 125 MCG tablet

## 2021-07-06 NOTE — Telephone Encounter (Signed)
Called pt's wife and went over which pharmacy she would like the medications sent to. She states they are using [facility] on [address] and [facility] on [address]. The medications were sent to the corrected pharmacy.

## 2021-07-15 ENCOUNTER — Other Ambulatory Visit: Payer: Self-pay

## 2021-07-15 ENCOUNTER — Ambulatory Visit
Admission: EM | Admit: 2021-07-15 | Discharge: 2021-07-15 | Disposition: A | Discharge status: Home / Self Care | Payer: Medicare Other | Attending: Emergency Medicine | Admitting: Emergency Medicine

## 2021-07-15 DIAGNOSIS — B029 Zoster without complications: Secondary | ICD-10-CM | POA: Diagnosis not present

## 2021-07-15 MED ORDER — VALACYCLOVIR HCL 1 G PO TABS: 1000.0000 mg | ORAL_TABLET | Freq: Three times a day (TID) | ORAL | 0 refills | Status: AC

## 2021-07-15 NOTE — Discharge Instructions (Addendum)
Please begin valacyclovir today, take 1 tablet 3 times daily.  If you can manage to get in your first two doses today, at least 6 hours apart that would be ideal.  This antiviral medication may not cure the rash but it will weaken the virus so that if it ever decides to reappear, you may have no symptoms at all.  Please do continue your current home treatments with baking soda and anti-itch cream, they appear to be very effective.  I have reviewed your medication list in detail, valacyclovir does not have any drug to drug interactions with any of the medications that you are currently taking.  Thank you for visiting urgent care today allowing me to participate in your care.  Please follow-up either with [place] or with your primary care provider if you have worsening symptoms or do not have meaningful relief of your symptoms within the next week to 10 days.

## 2021-07-15 NOTE — ED Triage Notes (Signed)
Pt reports possibly having shingles (to upper back and chest). Started: around 3-4 days ago

## 2021-07-15 NOTE — ED Provider Notes (Signed)
[facility]    CSN: [id number] Arrival date & time: 07/15/21  1406    HISTORY   Chief Complaint  Patient presents with   Rash   HPI Charles [name] is a 81 y.o. male. Patient complains of a possible shingles outbreak on upper back and chest, states symptoms began 3 to 4 days ago.  Patient is here with his wife today who states she has been applying a plaster of baking soda and water, alternating this with anti-itch cream.  At this time, patient states he has no discomfort whatsoever other than just some mild itching.  Wife states she was speaking with a friend of hers who happens to be a nurse, states her friend advised her to have this rash looked at because it may be shingles.  Patient reports having had chickenpox as a child.   Past Medical History:  Diagnosis Date   Arthritis    Atrial dilatation    mild biatrial dilitation   Bruises easily    CHF (congestive heart failure) (HCC)    CKD (chronic kidney disease), stage III (HCC)    Coronary artery disease    Dysrhythmia    atrial fibrillation   GERD (gastroesophageal reflux disease)    H/O hiatal hernia    Hypercholesterolemia    Hyperglycemia    Hypertension    Hypothyroidism    Mitral regurgitation    a. Mild by echo in 01/2014.   Nonischemic cardiomyopathy ([place])    a. presumed to be tachycardia mediated. Varying EFs over the years from 35-40% to normal and back.   Paroxysmal atrial fibrillation ([place])    a. H/o difficult to control. b. s/p PVI ablation 12-03-2012 by Dr Rayann Heman with recurrence afterwards, requiring period of amiodarone.   Peripheral edema    Sleep apnea    USES C PAP   Tear of medial meniscus of left knee    Tremor    Patient Active Problem List   Diagnosis Date Noted   ILD (interstitial lung disease) ([place]) 08/02/2016   Asbestos exposure 06/08/2016   Persistent atrial fibrillation ([place])    Bruit 10/01/2015   H/O total hip arthroplasty 08/12/2015   Hyperglycemia    CKD (chronic  kidney disease), stage III ([place])    Osteoarthritis of left knee 11/20/2013   Complex tear of medial meniscus of left knee as current injury 11/20/2013   Encounter for therapeutic drug monitoring 09/15/2013   Complex sleep apnea syndrome 11/21/2012   Nonischemic cardiomyopathy ([address]) 11/09/2012   Snoring 11/09/2012   Hypothyroidism 08/26/2012   Long term (current) use of anticoagulants 08/01/2012   Tremor 10/24/2011   Atrial fibrillation (HCC)    Peripheral edema    Swelling    Mitral regurgitation    Orthopnea    Dyspnea    CHF (congestive heart failure) ([place])    Hypertension    Hypercholesterolemia    Chronic renal insufficiency    HYPERTENSION 11/07/2006   GERD 11/07/2006   ABDOMINAL WALL HERNIA 11/07/2006   OSTEOARTHROSIS NOS, UNSPECIFIED SITE 11/07/2006   ABRASION, LOWER EXTREMITY W/INFECTION 11/07/2006   NEPHROLITHIASIS, HX OF 11/07/2006   HIP REPLACEMENT, TOTAL, HX OF 11/07/2006   Past Surgical History:  Procedure Laterality Date   ATRIAL FIBRILLATION ABLATION N/A 12/03/2012   Procedure: ATRIAL FIBRILLATION ABLATION;  Surgeon: Thompson Grayer, MD;  Location: [facility] CATH LAB;  Service: Cardiovascular;  Laterality: N/A;   ATRIAL FIBRILLATION ABLATION     PVI by Dr Rayann Heman   CARDIAC CATHETERIZATION  CARDIOVERSION N/A 09/04/2012   Procedure: CARDIOVERSION;  Surgeon: Darlin Coco, MD;  Location: [place];  Service: Cardiovascular;  Laterality: N/A;   CARDIOVERSION N/A 02/11/2013   Procedure: CARDIOVERSION;  Surgeon: Darlin Coco, MD;  Location: [place];  Service: Cardiovascular;  Laterality: N/A;   CARDIOVERSION N/A 12/07/2015   Procedure: CARDIOVERSION;  Surgeon: Pixie Casino, MD;  Location: [place];  Service: Cardiovascular;  Laterality: N/A;   CARDIOVERSION N/A 03/11/2018   Procedure: CARDIOVERSION;  Surgeon: Dorothy Spark, MD;  Location: [facility] ENDOSCOPY;  Service: Cardiovascular;  Laterality: N/A;   CARDIOVERSION N/A 09/23/2020   Procedure:  CARDIOVERSION;  Surgeon: Fay Records, MD;  [facility];  Service: Cardiovascular;  Laterality: N/A;   CATARACT EXTRACTION, BILATERAL     EYE SURGERY  2015,2016   bilateral cataract surgery with lens implants   HERNIA REPAIR  03/2005   JOINT REPLACEMENT  2005   total right hip replacement   kidney stent  1987, 02/2005   KIDNEY STONE SURGERY     2 stents placed   KNEE ARTHROSCOPY WITH DRILLING/MICROFRACTURE Left 11/20/2013   Procedure: KNEE ARTHROSCOPY WITH DRILLING/MICROFRACTURE;  Surgeon: Tobi Bastos, MD;  Location: [facility];  Service: Orthopedics;  Laterality: Left;   KNEE ARTHROSCOPY WITH MEDIAL MENISECTOMY Left 11/20/2013   Procedure: LEFT KNEE ARTHROSCOPY WITH MEDIAL MENISECTOMY;  Surgeon: Tobi Bastos, MD;  Location: [facility];  Service: Orthopedics;  Laterality: Left;  Microfracture of medial femoral chondyle and abrasion chondroplasty of the medial femoral chondyle   LITHOTRIPSY     x 2   PICC line placement  2007   quadrec  1999   LFT leg quad sx   right hip surgery  2007   irrigation and debridement from bacteria   TEE WITHOUT CARDIOVERSION N/A 12/02/2012   Procedure: TRANSESOPHAGEAL ECHOCARDIOGRAM (TEE);  Surgeon: Fay Records, MD;  Location: [facility] ENDOSCOPY;  Service: Cardiovascular;  Laterality: N/A;   TOTAL HIP ARTHROPLASTY Left 08/12/2015   Procedure: LEFT TOTAL HIP ARTHROPLASTY;  Surgeon: Latanya Maudlin, MD;  Location: [facility];  Service: Orthopedics;  Laterality: Left;    Home Medications    Prior to Admission medications   Medication Sig Start Date End Date Taking? Authorizing Provider  acetaminophen (TYLENOL) 650 MG CR tablet Take 650 mg by mouth daily as needed for pain.    [provider]  amiodarone (PACERONE) 200 MG tablet TAKE ONE TABLET BY MOUTH DAILY 12/10/20   Lelon Perla, MD  Ascorbic Acid (VITAMIN C) 500 MG tablet Take 1,000 mg by mouth 2 (two) times daily.     [provider]  Cholecalciferol (VITAMIN D) 125 MCG (5000 UT)  CAPS Take 5,000 Units by mouth daily.    [provider]  Coenzyme Q10 (COQ10) 100 MG CAPS Take 100 mg by mouth every morning.    [provider]  edoxaban ([name]) 60 MG TABS tablet Take 60 mg by mouth daily. 07/06/21   Lelon Perla, MD  enalapril (VASOTEC) 20 MG tablet Take 0.5 tablets (10 mg total) by mouth at bedtime. 0.5  Tablet at Bedtime 02/11/21   Lelon Perla, MD  EQ MAGNESIUM CITRATE PO Take 420 mg by mouth daily.    [provider]  furosemide (LASIX) 40 MG tablet TAKE 1 AND 1/2 TABLET BY MOUTH EVERY MORNING 06/03/21   Lelon Perla, MD  IRON PO Take 1 tablet by mouth daily at 12 noon.     [provider]  levothyroxine (SYNTHROID) 125 MCG  tablet Take 1 tablet (125 mcg total) by mouth daily before breakfast. 07/06/21   Crenshaw, Denice Bors, MD  Misc Natural Products (OSTEO BI-FLEX JOINT SHIELD PO) Take 2 tablets by mouth every morning.    [provider]  Multiple Vitamin (MULTIVITAMIN) tablet Take 1 tablet by mouth daily at 12 noon.    [provider]  Omega-3 Fatty Acids (FISH OIL) 1000 MG CAPS Take 1,000 mg by mouth 2 (two) times daily.    [provider]  omeprazole (PRILOSEC) 40 MG capsule TAKE ONE CAPSULE BY MOUTH DAILY 12/01/20   Lelon Perla, MD  polyvinyl alcohol (LIQUIFILM TEARS) 1.4 % ophthalmic solution Place 1 drop into both eyes daily as needed for dry eyes.    [provider]  zinc gluconate 50 MG tablet Take 50 mg by mouth daily at 12 noon.     [provider]    Family History Family History  Problem Relation Age of Onset   Cancer Mother 46       CANCER   Heart disease Father 24       HEART PROBLEMS   Lupus Brother        "of the skin"   Social History Social History   Tobacco Use   Smoking status: Never   Smokeless tobacco: Never  Vaping Use   Vaping Use: Never used  Substance Use Topics   Alcohol use: No   Drug use: No   Allergies   Ceftriaxone sodium,  Clindamycin/lincomycin, Diltiazem hcl, and Penicillins  Review of Systems Review of Systems Pertinent findings noted in history of present illness.   Physical Exam Triage Vital Signs ED Triage Vitals  Enc Vitals Group     BP 04/22/21 0827 (!) 147/82     Pulse Rate 04/22/21 0827 72     Resp 04/22/21 0827 18     Temp 04/22/21 0827 98.3 F (36.8 C)     Temp Source 04/22/21 0827 Oral     SpO2 04/22/21 0827 98 %     Weight --      Height --      Head Circumference --      Peak Flow --      Pain Score 04/22/21 0826 5     Pain Loc --      Pain Edu? --      Excl. in [place]? --   No data found.  Updated Vital Signs BP 120/78 (BP Location: Right Arm)    Pulse 94    Temp 97.9 F (36.6 C) (Oral)    Resp 18    SpO2 99%   Physical Exam Vitals and nursing note reviewed.  Constitutional:      General: He is not in acute distress.    Appearance: Normal appearance. He is not ill-appearing.  HENT:     Head: Normocephalic and atraumatic.  Eyes:     General: Lids are normal.        Right eye: No discharge.        Left eye: No discharge.     Extraocular Movements: Extraocular movements intact.     Conjunctiva/sclera: Conjunctivae normal.     Right eye: Right conjunctiva is not injected.     Left eye: Left conjunctiva is not injected.  Neck:     Trachea: Trachea and phonation normal.  Cardiovascular:     Rate and Rhythm: Normal rate and regular rhythm.     Pulses: Normal pulses.     Heart sounds: Normal  heart sounds. No murmur heard.   No friction rub. No gallop.  Pulmonary:     Effort: Pulmonary effort is normal. No accessory muscle usage, prolonged expiration or respiratory distress.     Breath sounds: Normal breath sounds. No stridor, decreased air movement or transmitted upper airway sounds. No decreased breath sounds, wheezing, rhonchi or rales.  Chest:     Chest wall: No tenderness.  Musculoskeletal:        General: Normal range of motion.     Cervical back: Normal range of  motion and neck supple. Normal range of motion.  Lymphadenopathy:     Cervical: No cervical adenopathy.  Skin:    General: Skin is warm and dry.     Findings: No erythema or rash (T2 dermatome on the left with rash concerning for herpes zoster).  Neurological:     General: No focal deficit present.     Mental Status: He is alert and oriented to person, place, and time.  Psychiatric:        Mood and Affect: Mood normal.        Behavior: Behavior normal.    Visual Acuity Right Eye Distance:   Left Eye Distance:   Bilateral Distance:    Right Eye Near:   Left Eye Near:    Bilateral Near:     UC Couse / Diagnostics / Procedures:    EKG  Radiology No results found.  Procedures Procedures (including critical care time)  UC Diagnoses / Final Clinical Impressions(s)   I have reviewed the triage vital signs and the nursing notes.  Pertinent labs & imaging results that were available during my care of the patient were reviewed by me and considered in my medical decision making (see chart for details).    Final diagnoses:  Herpes zoster without complication   Begin valacyclovir 1 g 3 times daily for 7 days.  Continue home therapy as this seems to be helping.  Return precautions advised.  ED Prescriptions     Medication Sig Dispense Auth. Provider   valACYclovir (VALTREX) 1000 MG tablet Take 1 tablet (1,000 mg total) by mouth 3 (three) times daily for 7 days. 21 tablet Lynden Oxford Scales, PA-C      PDMP not reviewed this encounter.  Pending results:  Labs Reviewed - No data to display  Medications Ordered in UC: Medications - No data to display  Disposition Upon Discharge:  Condition: stable for discharge home Home: take medications as prescribed; routine discharge instructions as discussed; follow up as advised.  Patient presented with an acute illness with associated systemic symptoms and significant discomfort requiring urgent management. In my opinion, this  is a condition that a prudent [place] person (someone who possesses an average knowledge of health and medicine) may potentially expect to result in complications if not addressed urgently such as respiratory distress, impairment of bodily function or dysfunction of bodily organs.   Routine symptom specific, illness specific and/or disease specific instructions were discussed with the patient and/or caregiver at length.   As such, the patient has been evaluated and assessed, work-up was performed and treatment was provided in alignment with urgent care protocols and evidence based medicine.  Patient/parent/caregiver has been advised that the patient may require follow up for further testing and treatment if the symptoms continue in spite of treatment, as clinically indicated and appropriate.  If the patient was tested for COVID-19, Influenza and/or RSV, then the patient/parent/guardian was advised to isolate at home pending the results of  his/her diagnostic coronavirus test and potentially longer if theyre positive. I have also advised pt that if his/her COVID-19 test returns positive, it's recommended to self-isolate for at least 10 days after symptoms first appeared AND until fever-free for 24 hours without fever reducer AND other symptoms have improved or resolved. Discussed self-isolation recommendations as well as instructions for household member/close contacts as per the [facility] and [place] DHHS, and also gave patient the [place] packet with this information.  Patient/parent/caregiver has been advised to return to the [facility] or PCP in 3-5 days if no better; to PCP or the Emergency Department if new signs and symptoms develop, or if the current signs or symptoms continue to change or worsen for further workup, evaluation and treatment as clinically indicated and appropriate  The patient will follow up with their current PCP if and as advised. If the patient does not currently have a PCP we will assist them in  obtaining one.   The patient may need specialty follow up if the symptoms continue, in spite of conservative treatment and management, for further workup, evaluation, consultation and treatment as clinically indicated and appropriate.   Patient/parent/caregiver verbalized understanding and agreement of plan as discussed.  All questions were addressed during visit.  Please see discharge instructions below for further details of plan.  Discharge Instructions:   Discharge Instructions      Please begin valacyclovir today, take 1 tablet 3 times daily.  If you can manage to get in your first two doses today, at least 6 hours apart that would be ideal.  This antiviral medication may not cure the rash but it will weaken the virus so that if it ever decides to reappear, you may have no symptoms at all.  Please do continue your current home treatments with baking soda and anti-itch cream, they appear to be very effective.  I have reviewed your medication list in detail, valacyclovir does not have any drug to drug interactions with any of the medications that you are currently taking.  Thank you for visiting urgent care today allowing me to participate in your care.  Please follow-up either with [place] or with your primary care provider if you have worsening symptoms or do not have meaningful relief of your symptoms within the next week to 10 days.      This office note has been dictated using Museum/gallery curator.  Unfortunately, and despite my best efforts, this method of dictation can sometimes lead to occasional typographical or grammatical errors.  I apologize in advance if this occurs.     Lynden Oxford Scales, [place] 07/15/21 [phone]

## 2021-07-19 ENCOUNTER — Encounter: Payer: Self-pay | Admitting: Cardiology

## 2021-07-20 ENCOUNTER — Telehealth: Payer: Self-pay

## 2021-07-20 NOTE — Telephone Encounter (Signed)
Spoke with pt, he does not want to make an appointment at this time. He would like to wait a week or two to see how it goes. He will call me if he wants to schedule an appointment.

## 2021-07-20 NOTE — Telephone Encounter (Signed)
Called patient- he states that his heart is out of rhythm he believes since he found out he had shingles- he can feel it when he checks his pulse himself, and his BP monitor tells him so. He states that he knows as he has been through this many times. He is not having any symptoms- no shortness of breath, dizziness, chest pains. He does mention feeling fatigued. He does currently have shingles, and he is on medications for it.  I reviewed schedule, did not see any openings, patient states he may have to have another cardioversion- but he would like to know next steps from [place]. I advised I would route to MD/RN to review and we could call back with recommendations.  Patient verbalized understanding.

## 2021-08-01 ENCOUNTER — Encounter: Payer: Self-pay | Admitting: Cardiology

## 2021-08-08 NOTE — H&P (View-Only) (Signed)
Cardiology Clinic Note   Patient Name: Charles [name] Date of Encounter: 08/10/2021  Primary Care Provider:  Eunice Blase, MD Primary Cardiologist:  None  Patient Profile    Charles [name] 81 year old male presents to the clinic today for follow-up evaluation of his atrial fibrillation and hypertension.  Past Medical History    Past Medical History:  Diagnosis Date   Arthritis    Atrial dilatation    mild biatrial dilitation   Bruises easily    CHF (congestive heart failure) (HCC)    CKD (chronic kidney disease), stage III (HCC)    Coronary artery disease    Dysrhythmia    atrial fibrillation   GERD (gastroesophageal reflux disease)    H/O hiatal hernia    Hypercholesterolemia    Hyperglycemia    Hypertension    Hypothyroidism    Mitral regurgitation    a. Mild by echo in 01/2014.   Nonischemic cardiomyopathy ([place])    a. presumed to be tachycardia mediated. Varying EFs over the years from 35-40% to normal and back.   Paroxysmal atrial fibrillation ([place])    a. H/o difficult to control. b. s/p PVI ablation 12-03-2012 by Dr Rayann Heman with recurrence afterwards, requiring period of amiodarone.   Peripheral edema    Sleep apnea    USES C PAP   Tear of medial meniscus of left knee    Tremor    Past Surgical History:  Procedure Laterality Date   ATRIAL FIBRILLATION ABLATION N/A 12/03/2012   Procedure: ATRIAL FIBRILLATION ABLATION;  Surgeon: Thompson Grayer, MD;  Location: [facility] CATH LAB;  Service: Cardiovascular;  Laterality: N/A;   ATRIAL FIBRILLATION ABLATION     PVI by Dr Rayann Heman   CARDIAC CATHETERIZATION     CARDIOVERSION N/A 09/04/2012   Procedure: CARDIOVERSION;  Surgeon: Darlin Coco, MD;  Location: [place];  Service: Cardiovascular;  Laterality: N/A;   CARDIOVERSION N/A 02/11/2013   Procedure: CARDIOVERSION;  Surgeon: Darlin Coco, MD;  Location: [place];  Service: Cardiovascular;  Laterality: N/A;   CARDIOVERSION N/A 12/07/2015   Procedure:  CARDIOVERSION;  Surgeon: Pixie Casino, MD;  Location: [place];  Service: Cardiovascular;  Laterality: N/A;   CARDIOVERSION N/A 03/11/2018   Procedure: CARDIOVERSION;  Surgeon: Dorothy Spark, MD;  Location: [facility] ENDOSCOPY;  Service: Cardiovascular;  Laterality: N/A;   CARDIOVERSION N/A 09/23/2020   Procedure: CARDIOVERSION;  Surgeon: Fay Records, MD;  Location: [facility] ENDOSCOPY;  Service: Cardiovascular;  Laterality: N/A;   CATARACT EXTRACTION, BILATERAL     EYE SURGERY  2015,2016   bilateral cataract surgery with lens implants   HERNIA REPAIR  03/2005   JOINT REPLACEMENT  2005   total right hip replacement   kidney stent  1987, 02/2005   KIDNEY STONE SURGERY     2 stents placed   KNEE ARTHROSCOPY WITH DRILLING/MICROFRACTURE Left 11/20/2013   Procedure: KNEE ARTHROSCOPY WITH DRILLING/MICROFRACTURE;  Surgeon: Tobi Bastos, MD;  Location: [facility];  Service: Orthopedics;  Laterality: Left;   KNEE ARTHROSCOPY WITH MEDIAL MENISECTOMY Left 11/20/2013   Procedure: LEFT KNEE ARTHROSCOPY WITH MEDIAL MENISECTOMY;  Surgeon: Tobi Bastos, MD;  Location: [facility];  Service: Orthopedics;  Laterality: Left;  Microfracture of medial femoral chondyle and abrasion chondroplasty of the medial femoral chondyle   LITHOTRIPSY     x 2   PICC line placement  2007   quadrec  1999   LFT leg quad sx   right hip surgery  2007   irrigation and debridement from bacteria  TEE WITHOUT CARDIOVERSION N/A 12/02/2012   Procedure: TRANSESOPHAGEAL ECHOCARDIOGRAM (TEE);  Surgeon: Fay Records, MD;  Location: [facility] ENDOSCOPY;  Service: Cardiovascular;  Laterality: N/A;   TOTAL HIP ARTHROPLASTY Left 08/12/2015   Procedure: LEFT TOTAL HIP ARTHROPLASTY;  Surgeon: Latanya Maudlin, MD;  Location: [facility];  Service: Orthopedics;  Laterality: Left;    Allergies  Allergies  Allergen Reactions   Ceftriaxone Sodium Rash    Rocephin   Clindamycin/Lincomycin Itching and Rash    Hoarse voice   Diltiazem Hcl Rash    Cardizem    Penicillins Rash    Has patient had a PCN reaction causing immediate rash, facial/tongue/throat swelling, SOB or lightheadedness with hypotension: Unknown Has patient had a PCN reaction causing severe rash involving mucus membranes or skin necrosis: Unknown Has patient had a PCN reaction that required hospitalization: No Has patient had a PCN reaction occurring within the last 10 years: Yes If all of the above answers are "NO", then may proceed with Cephalosporin use.     History of Present Illness    Charles [name] is a PMH of paroxysmal atrial fibrillation, essential hypertension, tachycardia mediated cardiomyopathy, and stage III CKD.  He underwent A-fib mapping for ablation in 2014 by Dr. Rayann Heman.  His carotid Dopplers 4/17 were normal.  During previous office visits he asked to discontinue amiodarone.  However, he did contact the office and felt that he had gone back into atrial fibrillation.  His amiodarone was then resumed.  His echocardiogram 12/21 showed normal LV function, G2 DD, moderate left atrial enlargement.  He underwent successful cardioversion 3/22.  He was seen in follow-up by Dr. Stanford Breed on 10/26/2020.  During that time he was stable from a cardiac standpoint.  He denied dyspnea, chest pain, palpitations, syncope and bleeding issues.  He presents to the clinic today for follow-up evaluation states he has noticed increased fatigue since January.  He reports that he had a bout of shingles during that time that covered his left chest.  He has since recovered from shingles.  His EKG today shows atrial fibrillation 114 bpm.  Case was discussed with DOD.  I will schedule him for DCCV, increase his amiodarone to 40 mg x 1 week and then have him resume 2 mg daily.  We reviewed triggers for atrial fibrillation.  We will plan follow-up for after his DCCV.  He reports compliance with his anticoagulation, denies bleeding issues.  Today he denies chest pain, shortness of breath, lower extremity  edema, f palpitations, melena, hematuria, hemoptysis, diaphoresis, weakness, presyncope, syncope, orthopnea, and PND.   Home Medications    Prior to Admission medications   Medication Sig Start Date End Date Taking? Authorizing Provider  acetaminophen (TYLENOL) 650 MG CR tablet Take 650 mg by mouth daily as needed for pain.    [provider]  amiodarone (PACERONE) 200 MG tablet TAKE ONE TABLET BY MOUTH DAILY 12/10/20   Lelon Perla, MD  Ascorbic Acid (VITAMIN C) 500 MG tablet Take 1,000 mg by mouth 2 (two) times daily.     [provider]  Cholecalciferol (VITAMIN D) 125 MCG (5000 UT) CAPS Take 5,000 Units by mouth daily.    [provider]  Coenzyme Q10 (COQ10) 100 MG CAPS Take 100 mg by mouth every morning.    [provider]  edoxaban ([name]) 60 MG TABS tablet Take 60 mg by mouth daily. 07/06/21   Lelon Perla, MD  enalapril (VASOTEC) 20 MG tablet Take 0.5 tablets (10 mg  total) by mouth at bedtime. 0.5  Tablet at Bedtime 02/11/21   Lelon Perla, MD  EQ MAGNESIUM CITRATE PO Take 420 mg by mouth daily.    [provider]  furosemide (LASIX) 40 MG tablet TAKE 1 AND 1/2 TABLET BY MOUTH EVERY MORNING 06/03/21   Lelon Perla, MD  IRON PO Take 1 tablet by mouth daily at 12 noon.     [provider]  levothyroxine (SYNTHROID) 125 MCG tablet Take 1 tablet (125 mcg total) by mouth daily before breakfast. 07/06/21   Crenshaw, Denice Bors, MD  Misc Natural Products (OSTEO BI-FLEX JOINT SHIELD PO) Take 2 tablets by mouth every morning.    [provider]  Multiple Vitamin (MULTIVITAMIN) tablet Take 1 tablet by mouth daily at 12 noon.    [provider]  Omega-3 Fatty Acids (FISH OIL) 1000 MG CAPS Take 1,000 mg by mouth 2 (two) times daily.    [provider]  omeprazole (PRILOSEC) 40 MG capsule TAKE ONE CAPSULE BY MOUTH DAILY 12/01/20   Lelon Perla, MD  polyvinyl alcohol (LIQUIFILM TEARS) 1.4 % ophthalmic  solution Place 1 drop into both eyes daily as needed for dry eyes.    [provider]  zinc gluconate 50 MG tablet Take 50 mg by mouth daily at 12 noon.     [provider]    Family History    Family History  Problem Relation Age of Onset   Cancer Mother 53       CANCER   Heart disease Father 32       HEART PROBLEMS   Lupus Brother        "of the skin"   He indicated that his mother is deceased. He indicated that his father is deceased. He indicated that both of his brothers are alive. He indicated that his maternal grandmother is deceased. He indicated that his maternal grandfather is deceased. He indicated that his paternal grandmother is deceased. He indicated that his paternal grandfather is deceased. He indicated that his child is alive.  Social History    Social History   Socioeconomic History   Marital status: Married    Spouse name: Inez Catalina   Number of children: 2   Years of education: Not on file   Highest education level: Some college, no degree  Occupational History   Occupation: Contractor: RETIRED  Tobacco Use   Smoking status: Never   Smokeless tobacco: Never  Vaping Use   Vaping Use: Never used  Substance and Sexual Activity   Alcohol use: No   Drug use: No   Sexual activity: Not on file  Other Topics Concern   Not on file  Social History Narrative   Lives in [place] with spouse.  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   [facility]:   Has worked extensively in [employer]. Significant asbestos asbestos exposure. No bird or mold exposure.       Right handed   Social Determinants of Health   Financial Resource Strain: Not on file  Food Insecurity: Not on file  Transportation Needs: Not on file  Physical Activity: Not on file  Stress: Not on file  Social Connections: Not on file  Intimate Partner Violence: Not on file     Review of Systems    General:   No chills, fever, night sweats or weight changes.  Cardiovascular:  No  chest pain, dyspnea on exertion, edema, orthopnea, palpitations, paroxysmal nocturnal dyspnea. Dermatological: No rash, lesions/masses Respiratory: No cough, dyspnea Urologic: No hematuria, dysuria Abdominal:   No nausea, vomiting, diarrhea, bright red blood per rectum, melena, or hematemesis Neurologic:  No visual changes, wkns, changes in mental status. All other systems reviewed and are otherwise negative except as noted above.  Physical Exam    VS:  BP 126/64 (BP Location: Left Arm, Patient Position: Sitting, Cuff Size: Normal)    Pulse (!) 114    Ht _0  (1.778 m)    Wt 175 lb 6.4 oz (79.6 kg)    SpO2 99%    BMI 25.17 kg/m  , BMI Body mass index is 25.17 kg/m. GEN: Well nourished, well developed, in no acute distress. HEENT: normal. Neck: Supple, no JVD, carotid bruits, or masses. Cardiac: Irregularly irregular, no murmurs, rubs, or gallops. No clubbing, cyanosis, edema.  Radials/DP/PT 2+ and equal bilaterally.  Respiratory:  Respirations regular and unlabored, clear to auscultation bilaterally. GI: Soft, nontender, nondistended, BS + x 4. MS: no deformity or atrophy. Skin: warm and dry, no rash. Neuro:  Strength and sensation are intact. Psych: Normal affect.  Accessory Clinical Findings    Recent Labs: 09/20/2020: BUN 37; Creatinine, Ser 2.01; Hemoglobin 12.6; Platelets 243; Potassium 5.0; Sodium 139; TSH 1.410   Recent Lipid Panel    Component Value Date/Time   CHOL 187 03/15/2020 0911   TRIG 208 (H) 03/15/2020 0911   HDL 41 03/15/2020 0911   CHOLHDL 4.6 03/15/2020 0911   VLDL 50 (H) 05/05/2015 1023   LDLCALC 114 (H) 03/15/2020 0911   LDLDIRECT 95.3 10/24/2011 1022    ECG personally reviewed by me today-atrial fibrillation with rapid ventricular response ST and T wave abnormality consider lateral ischemia 114 bpm- No acute changes  EKG 10/26/2020  Sinus bradycardia nonspecific ST changes 54  bpm  Echocardiogram 05/31/2020 IMPRESSIONS     1. Left ventricular ejection fraction, by estimation, is 55%. The left  ventricle has normal function. The left ventricle has no regional wall  motion abnormalities. Left ventricular diastolic parameters are consistent  with Grade II diastolic dysfunction  (pseudonormalization).   2. Right ventricular systolic function is normal. The right ventricular  size is normal. There is mildly elevated pulmonary artery systolic  pressure. The estimated right ventricular systolic pressure is 00.7 mmHg.   3. Left atrial size was moderately dilated.   4. The mitral valve is normal in structure. Trivial mitral valve  regurgitation. No evidence of mitral stenosis.   5. The aortic valve is tricuspid. Aortic valve regurgitation is not  visualized. Mild aortic valve sclerosis is present, with no evidence of  aortic valve stenosis.   6. The inferior vena cava is normal in size with greater than 50%  respiratory variability, suggesting right atrial pressure of 3 mmHg.  Assessment & Plan   1.  Paroxysmal atrial fibrillation-EKG today shows afib 114 bpm.  Notes increased fatigue since having shingles in January.  Case discussed with DOD. Continue edoxaban Increase of amiodarone to 400 mg daily for 1 week then resume 200 mg dosing Heart healthy low-sodium diet-salty 6 given Increase physical activity as tolerated Avoid triggers caffeine, chocolate, EtOH etc. Order DCCV in 1 week  Shared Decision Making/Informed Consent The risks (stroke, cardiac arrhythmias rarely resulting in the need for a temporary or permanent pacemaker, skin irritation or burns and complications associated with conscious sedation including aspiration, arrhythmia, respiratory failure and death), benefits (restoration of normal sinus rhythm) and  alternatives of a direct current cardioversion were explained in detail to Mr. Micheletti and he agrees to proceed.    Essential hypertension-BP  today 126/64.  Well-controlled at home. Continue enalapril, furosemide Heart healthy low-sodium diet-salty 6 given Increase physical activity as tolerated  Mitral valve regurgitation- Echocardiogram 05/31/2020 showed trivial mitral valve regurgitation with no evidence of mitral valve stenosis. Repeat echocardiogram when clinically indicated  CKD stage III-EGFR 33 on 09/20/2020. Follows with PCP  Disposition: Follow-up with Dr. Stanford Breed or me in post DCCV.   Jossie Ng. Dyrell Tuccillo NP-C    08/10/2021, 3:55 PM [place] [address] [address] Office [phone] Fax [phone]  Notice: This dictation was prepared with Dragon dictation along with smaller phrase technology. Any transcriptional errors that result from this process are unintentional and may not be corrected upon review.  I spent 15 minutes examining this patient, reviewing medications, and using patient centered shared decision making involving her cardiac care.  Prior to her visit I spent greater than 20 minutes reviewing her past medical history,  medications, and prior cardiac tests.

## 2021-08-08 NOTE — Progress Notes (Signed)
Cardiology Clinic Note   Patient Name: Charles [name] Date of Encounter: 08/10/2021  Primary Care Provider:  Eunice Blase, MD Primary Cardiologist:  None  Patient Profile    Charles [name] 81 year old male presents to the clinic today for follow-up evaluation of his atrial fibrillation and hypertension.  Past Medical History    Past Medical History:  Diagnosis Date   Arthritis    Atrial dilatation    mild biatrial dilitation   Bruises easily    CHF (congestive heart failure) (HCC)    CKD (chronic kidney disease), stage III (HCC)    Coronary artery disease    Dysrhythmia    atrial fibrillation   GERD (gastroesophageal reflux disease)    H/O hiatal hernia    Hypercholesterolemia    Hyperglycemia    Hypertension    Hypothyroidism    Mitral regurgitation    a. Mild by echo in 01/2014.   Nonischemic cardiomyopathy ([place])    a. presumed to be tachycardia mediated. Varying EFs over the years from 35-40% to normal and back.   Paroxysmal atrial fibrillation ([place])    a. H/o difficult to control. b. s/p PVI ablation 12-03-2012 by Dr Rayann Heman with recurrence afterwards, requiring period of amiodarone.   Peripheral edema    Sleep apnea    USES C PAP   Tear of medial meniscus of left knee    Tremor    Past Surgical History:  Procedure Laterality Date   ATRIAL FIBRILLATION ABLATION N/A 12/03/2012   Procedure: ATRIAL FIBRILLATION ABLATION;  Surgeon: Thompson Grayer, MD;  Location: [facility] CATH LAB;  Service: Cardiovascular;  Laterality: N/A;   ATRIAL FIBRILLATION ABLATION     PVI by Dr Rayann Heman   CARDIAC CATHETERIZATION     CARDIOVERSION N/A 09/04/2012   Procedure: CARDIOVERSION;  Surgeon: Darlin Coco, MD;  Location: [place];  Service: Cardiovascular;  Laterality: N/A;   CARDIOVERSION N/A 02/11/2013   Procedure: CARDIOVERSION;  Surgeon: Darlin Coco, MD;  Location: [place];  Service: Cardiovascular;  Laterality: N/A;   CARDIOVERSION N/A 12/07/2015   Procedure:  CARDIOVERSION;  Surgeon: Pixie Casino, MD;  Location: [place];  Service: Cardiovascular;  Laterality: N/A;   CARDIOVERSION N/A 03/11/2018   Procedure: CARDIOVERSION;  Surgeon: Dorothy Spark, MD;  Location: [facility] ENDOSCOPY;  Service: Cardiovascular;  Laterality: N/A;   CARDIOVERSION N/A 09/23/2020   Procedure: CARDIOVERSION;  Surgeon: Fay Records, MD;  Location: [facility] ENDOSCOPY;  Service: Cardiovascular;  Laterality: N/A;   CATARACT EXTRACTION, BILATERAL     EYE SURGERY  2015,2016   bilateral cataract surgery with lens implants   HERNIA REPAIR  03/2005   JOINT REPLACEMENT  2005   total right hip replacement   kidney stent  1987, 02/2005   KIDNEY STONE SURGERY     2 stents placed   KNEE ARTHROSCOPY WITH DRILLING/MICROFRACTURE Left 11/20/2013   Procedure: KNEE ARTHROSCOPY WITH DRILLING/MICROFRACTURE;  Surgeon: Tobi Bastos, MD;  Location: [facility];  Service: Orthopedics;  Laterality: Left;   KNEE ARTHROSCOPY WITH MEDIAL MENISECTOMY Left 11/20/2013   Procedure: LEFT KNEE ARTHROSCOPY WITH MEDIAL MENISECTOMY;  Surgeon: Tobi Bastos, MD;  Location: [facility];  Service: Orthopedics;  Laterality: Left;  Microfracture of medial femoral chondyle and abrasion chondroplasty of the medial femoral chondyle   LITHOTRIPSY     x 2   PICC line placement  2007   quadrec  1999   LFT leg quad sx   right hip surgery  2007   irrigation and debridement from bacteria  TEE WITHOUT CARDIOVERSION N/A 12/02/2012   Procedure: TRANSESOPHAGEAL ECHOCARDIOGRAM (TEE);  Surgeon: Fay Records, MD;  Location: [facility] ENDOSCOPY;  Service: Cardiovascular;  Laterality: N/A;   TOTAL HIP ARTHROPLASTY Left 08/12/2015   Procedure: LEFT TOTAL HIP ARTHROPLASTY;  Surgeon: Latanya Maudlin, MD;  Location: [facility];  Service: Orthopedics;  Laterality: Left;    Allergies  Allergies  Allergen Reactions   Ceftriaxone Sodium Rash    Rocephin   Clindamycin/Lincomycin Itching and Rash    Hoarse voice   Diltiazem Hcl Rash    Cardizem    Penicillins Rash    Has patient had a PCN reaction causing immediate rash, facial/tongue/throat swelling, SOB or lightheadedness with hypotension: Unknown Has patient had a PCN reaction causing severe rash involving mucus membranes or skin necrosis: Unknown Has patient had a PCN reaction that required hospitalization: No Has patient had a PCN reaction occurring within the last 10 years: Yes If all of the above answers are "NO", then may proceed with Cephalosporin use.     History of Present Illness    Charles [name] is a PMH of paroxysmal atrial fibrillation, essential hypertension, tachycardia mediated cardiomyopathy, and stage III CKD.  He underwent A-fib mapping for ablation in 2014 by Dr. Rayann Heman.  His carotid Dopplers 4/17 were normal.  During previous office visits he asked to discontinue amiodarone.  However, he did contact the office and felt that he had gone back into atrial fibrillation.  His amiodarone was then resumed.  His echocardiogram 12/21 showed normal LV function, G2 DD, moderate left atrial enlargement.  He underwent successful cardioversion 3/22.  He was seen in follow-up by Dr. Stanford Breed on 10/26/2020.  During that time he was stable from a cardiac standpoint.  He denied dyspnea, chest pain, palpitations, syncope and bleeding issues.  He presents to the clinic today for follow-up evaluation states he has noticed increased fatigue since January.  He reports that he had a bout of shingles during that time that covered his left chest.  He has since recovered from shingles.  His EKG today shows atrial fibrillation 114 bpm.  Case was discussed with DOD.  I will schedule him for DCCV, increase his amiodarone to 40 mg x 1 week and then have him resume 2 mg daily.  We reviewed triggers for atrial fibrillation.  We will plan follow-up for after his DCCV.  He reports compliance with his anticoagulation, denies bleeding issues.  Today he denies chest pain, shortness of breath, lower extremity  edema, f palpitations, melena, hematuria, hemoptysis, diaphoresis, weakness, presyncope, syncope, orthopnea, and PND.   Home Medications    Prior to Admission medications   Medication Sig Start Date End Date Taking? Authorizing Provider  acetaminophen (TYLENOL) 650 MG CR tablet Take 650 mg by mouth daily as needed for pain.    [provider]  amiodarone (PACERONE) 200 MG tablet TAKE ONE TABLET BY MOUTH DAILY 12/10/20   Lelon Perla, MD  Ascorbic Acid (VITAMIN C) 500 MG tablet Take 1,000 mg by mouth 2 (two) times daily.     [provider]  Cholecalciferol (VITAMIN D) 125 MCG (5000 UT) CAPS Take 5,000 Units by mouth daily.    [provider]  Coenzyme Q10 (COQ10) 100 MG CAPS Take 100 mg by mouth every morning.    [provider]  edoxaban ([name]) 60 MG TABS tablet Take 60 mg by mouth daily. 07/06/21   Lelon Perla, MD  enalapril (VASOTEC) 20 MG tablet Take 0.5 tablets (10 mg  total) by mouth at bedtime. 0.5  Tablet at Bedtime 02/11/21   Lelon Perla, MD  EQ MAGNESIUM CITRATE PO Take 420 mg by mouth daily.    [provider]  furosemide (LASIX) 40 MG tablet TAKE 1 AND 1/2 TABLET BY MOUTH EVERY MORNING 06/03/21   Lelon Perla, MD  IRON PO Take 1 tablet by mouth daily at 12 noon.     [provider]  levothyroxine (SYNTHROID) 125 MCG tablet Take 1 tablet (125 mcg total) by mouth daily before breakfast. 07/06/21   Crenshaw, Denice Bors, MD  Misc Natural Products (OSTEO BI-FLEX JOINT SHIELD PO) Take 2 tablets by mouth every morning.    [provider]  Multiple Vitamin (MULTIVITAMIN) tablet Take 1 tablet by mouth daily at 12 noon.    [provider]  Omega-3 Fatty Acids (FISH OIL) 1000 MG CAPS Take 1,000 mg by mouth 2 (two) times daily.    [provider]  omeprazole (PRILOSEC) 40 MG capsule TAKE ONE CAPSULE BY MOUTH DAILY 12/01/20   Lelon Perla, MD  polyvinyl alcohol (LIQUIFILM TEARS) 1.4 % ophthalmic  solution Place 1 drop into both eyes daily as needed for dry eyes.    [provider]  zinc gluconate 50 MG tablet Take 50 mg by mouth daily at 12 noon.     [provider]    Family History    Family History  Problem Relation Age of Onset   Cancer Mother 53       CANCER   Heart disease Father 32       HEART PROBLEMS   Lupus Brother        "of the skin"   He indicated that his mother is deceased. He indicated that his father is deceased. He indicated that both of his brothers are alive. He indicated that his maternal grandmother is deceased. He indicated that his maternal grandfather is deceased. He indicated that his paternal grandmother is deceased. He indicated that his paternal grandfather is deceased. He indicated that his child is alive.  Social History    Social History   Socioeconomic History   Marital status: Married    Spouse name: Inez Catalina   Number of children: 2   Years of education: Not on file   Highest education level: Some college, no degree  Occupational History   Occupation: Contractor: RETIRED  Tobacco Use   Smoking status: Never   Smokeless tobacco: Never  Vaping Use   Vaping Use: Never used  Substance and Sexual Activity   Alcohol use: No   Drug use: No   Sexual activity: Not on file  Other Topics Concern   Not on file  Social History Narrative   Lives in [place] with spouse.  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   [facility]:   Has worked extensively in [employer]. Significant asbestos asbestos exposure. No bird or mold exposure.       Right handed   Social Determinants of Health   Financial Resource Strain: Not on file  Food Insecurity: Not on file  Transportation Needs: Not on file  Physical Activity: Not on file  Stress: Not on file  Social Connections: Not on file  Intimate Partner Violence: Not on file     Review of Systems    General:   No chills, fever, night sweats or weight changes.  Cardiovascular:  No  chest pain, dyspnea on exertion, edema, orthopnea, palpitations, paroxysmal nocturnal dyspnea. Dermatological: No rash, lesions/masses Respiratory: No cough, dyspnea Urologic: No hematuria, dysuria Abdominal:   No nausea, vomiting, diarrhea, bright red blood per rectum, melena, or hematemesis Neurologic:  No visual changes, wkns, changes in mental status. All other systems reviewed and are otherwise negative except as noted above.  Physical Exam    VS:  BP 126/64 (BP Location: Left Arm, Patient Position: Sitting, Cuff Size: Normal)    Pulse (!) 114    Ht _0  (1.778 m)    Wt 175 lb 6.4 oz (79.6 kg)    SpO2 99%    BMI 25.17 kg/m  , BMI Body mass index is 25.17 kg/m. GEN: Well nourished, well developed, in no acute distress. HEENT: normal. Neck: Supple, no JVD, carotid bruits, or masses. Cardiac: Irregularly irregular, no murmurs, rubs, or gallops. No clubbing, cyanosis, edema.  Radials/DP/PT 2+ and equal bilaterally.  Respiratory:  Respirations regular and unlabored, clear to auscultation bilaterally. GI: Soft, nontender, nondistended, BS + x 4. MS: no deformity or atrophy. Skin: warm and dry, no rash. Neuro:  Strength and sensation are intact. Psych: Normal affect.  Accessory Clinical Findings    Recent Labs: 09/20/2020: BUN 37; Creatinine, Ser 2.01; Hemoglobin 12.6; Platelets 243; Potassium 5.0; Sodium 139; TSH 1.410   Recent Lipid Panel    Component Value Date/Time   CHOL 187 03/15/2020 0911   TRIG 208 (H) 03/15/2020 0911   HDL 41 03/15/2020 0911   CHOLHDL 4.6 03/15/2020 0911   VLDL 50 (H) 05/05/2015 1023   LDLCALC 114 (H) 03/15/2020 0911   LDLDIRECT 95.3 10/24/2011 1022    ECG personally reviewed by me today-atrial fibrillation with rapid ventricular response ST and T wave abnormality consider lateral ischemia 114 bpm- No acute changes  EKG 10/26/2020  Sinus bradycardia nonspecific ST changes 54  bpm  Echocardiogram 05/31/2020 IMPRESSIONS     1. Left ventricular ejection fraction, by estimation, is 55%. The left  ventricle has normal function. The left ventricle has no regional wall  motion abnormalities. Left ventricular diastolic parameters are consistent  with Grade II diastolic dysfunction  (pseudonormalization).   2. Right ventricular systolic function is normal. The right ventricular  size is normal. There is mildly elevated pulmonary artery systolic  pressure. The estimated right ventricular systolic pressure is 00.7 mmHg.   3. Left atrial size was moderately dilated.   4. The mitral valve is normal in structure. Trivial mitral valve  regurgitation. No evidence of mitral stenosis.   5. The aortic valve is tricuspid. Aortic valve regurgitation is not  visualized. Mild aortic valve sclerosis is present, with no evidence of  aortic valve stenosis.   6. The inferior vena cava is normal in size with greater than 50%  respiratory variability, suggesting right atrial pressure of 3 mmHg.  Assessment & Plan   1.  Paroxysmal atrial fibrillation-EKG today shows afib 114 bpm.  Notes increased fatigue since having shingles in January.  Case discussed with DOD. Continue edoxaban Increase of amiodarone to 400 mg daily for 1 week then resume 200 mg dosing Heart healthy low-sodium diet-salty 6 given Increase physical activity as tolerated Avoid triggers caffeine, chocolate, EtOH etc. Order DCCV in 1 week  Shared Decision Making/Informed Consent The risks (stroke, cardiac arrhythmias rarely resulting in the need for a temporary or permanent pacemaker, skin irritation or burns and complications associated with conscious sedation including aspiration, arrhythmia, respiratory failure and death), benefits (restoration of normal sinus rhythm) and  alternatives of a direct current cardioversion were explained in detail to Mr. Micheletti and he agrees to proceed.    Essential hypertension-BP  today 126/64.  Well-controlled at home. Continue enalapril, furosemide Heart healthy low-sodium diet-salty 6 given Increase physical activity as tolerated  Mitral valve regurgitation- Echocardiogram 05/31/2020 showed trivial mitral valve regurgitation with no evidence of mitral valve stenosis. Repeat echocardiogram when clinically indicated  CKD stage III-EGFR 33 on 09/20/2020. Follows with PCP  Disposition: Follow-up with Dr. Stanford Breed or me in post DCCV.   Jossie Ng. Denys Labree NP-C    08/10/2021, 3:55 PM [place] [address] Office [phone] Fax [phone]  Notice: This dictation was prepared with Dragon dictation along with smaller phrase technology. Any transcriptional errors that result from this process are unintentional and may not be corrected upon review.  I spent 15 minutes examining this patient, reviewing medications, and using patient centered shared decision making involving her cardiac care.  Prior to her visit I spent greater than 20 minutes reviewing her past medical history,  medications, and prior cardiac tests.

## 2021-08-10 ENCOUNTER — Ambulatory Visit: Payer: Medicare Other | Admitting: General Practice

## 2021-08-10 ENCOUNTER — Other Ambulatory Visit: Payer: Self-pay

## 2021-08-10 ENCOUNTER — Encounter: Payer: Self-pay | Admitting: General Practice

## 2021-08-10 VITALS — BP 126/64 | HR 114 | Ht 70.0 in | Wt 175.4 lb

## 2021-08-10 DIAGNOSIS — I340 Nonrheumatic mitral (valve) insufficiency: Secondary | ICD-10-CM | POA: Diagnosis not present

## 2021-08-10 DIAGNOSIS — I480 Paroxysmal atrial fibrillation: Secondary | ICD-10-CM | POA: Diagnosis not present

## 2021-08-10 DIAGNOSIS — N1832 Chronic kidney disease, stage 3b: Secondary | ICD-10-CM

## 2021-08-10 DIAGNOSIS — I4819 Other persistent atrial fibrillation: Secondary | ICD-10-CM

## 2021-08-10 DIAGNOSIS — I10 Essential (primary) hypertension: Secondary | ICD-10-CM | POA: Diagnosis not present

## 2021-08-10 MED ORDER — AMIODARONE HCL 200 MG PO TABS: 200.0000 mg | ORAL_TABLET | Freq: Every day | ORAL | 3 refills | Status: DC

## 2021-08-10 NOTE — Patient Instructions (Addendum)
Medication Instructions:  TAKE AMIODARONE 400MG  FOR 1 WEEK THEN BACK TO 200MG   *If you need a refill on your cardiac medications before your next appointment, please call your pharmacy*  Lab Work:    CBC AND BMET 1 WEEK BEFORE MONDAY      Testing/Procedures:  Your physician has recommended that you have a Cardioversion (DCCV)-SEE BELOW ON 09-16-2021. Electrical Cardioversion uses a jolt of electricity to your heart either through paddles or wired patches attached to your chest. This is a controlled, usually prescheduled, procedure. Defibrillation is done under light anesthesia in the hospital, and you usually go home the day of the procedure. This is done to get your heart back into a normal rhythm. You are not awake for the procedure. Please see the instruction sheet given to you today.  Follow-Up: Your next appointment:  09-08-2021  1PM  In Person with DR 09-18-2021  At [facility], you and your health needs are our priority.  As part of our continuing mission to provide you with exceptional heart care, we have created designated Provider Care Teams.  These Care Teams include your primary Cardiologist (physician) and Advanced Practice Providers (APPs -  Physician Assistants and Nurse Practitioners) who all work together to provide you with the care you need, when you need it.    Dear MR Jens Som,  You are scheduled for a Cardioversion on 08-19-2021 with Dr. Andrey Campanile.  Please arrive at the [facility] (Main Entrance A) at [place]: [address] [place], [address] [place] at 630 am/pm. (1 hour prior to procedure)  DIET: Nothing to eat or drink after midnight except a sips of water to take your medications (see medication instructions below)  Labs:  Come to:  OUR LAB HERE IN OUR OFFICE FOR NON FASTING LABWORK-Monday  BEFORE the cardioversion.  You must have a responsible person to drive you home and stay in the waiting area during your procedure. Failure to do so could result in  cancellation.  Bring your insurance cards.  *Special Note: Every effort is made to have your procedure done on time. Occasionally there are emergencies that occur at the hospital that may cause delays. Please be patient if a delay does occur.

## 2021-08-11 ENCOUNTER — Encounter (HOSPITAL_COMMUNITY): Payer: Self-pay | Admitting: Cardiology

## 2021-08-15 DIAGNOSIS — I10 Essential (primary) hypertension: Secondary | ICD-10-CM | POA: Diagnosis not present

## 2021-08-15 DIAGNOSIS — N1832 Chronic kidney disease, stage 3b: Secondary | ICD-10-CM | POA: Diagnosis not present

## 2021-08-15 DIAGNOSIS — I480 Paroxysmal atrial fibrillation: Secondary | ICD-10-CM | POA: Diagnosis not present

## 2021-08-15 DIAGNOSIS — I340 Nonrheumatic mitral (valve) insufficiency: Secondary | ICD-10-CM | POA: Diagnosis not present

## 2021-08-15 LAB — CBC
Hematocrit: 32.9 % — ABNORMAL LOW (ref 37.5–51.0)
Hemoglobin: 11.1 g/dL — ABNORMAL LOW (ref 13.0–17.7)
MCH: 32.1 pg (ref 26.6–33.0)
MCHC: 33.7 g/dL (ref 31.5–35.7)
MCV: 95 fL (ref 79–97)
Platelets: 205 10*3/uL (ref 150–450)
RBC: 3.46 x10E6/uL — ABNORMAL LOW (ref 4.14–5.80)
RDW: 12.8 % (ref 11.6–15.4)
WBC: 7.9 10*3/uL (ref 3.4–10.8)

## 2021-08-15 LAB — BASIC METABOLIC PANEL
BUN/Creatinine Ratio: 28 — ABNORMAL HIGH (ref 10–24)
BUN: 72 mg/dL — ABNORMAL HIGH (ref 8–27)
CO2: 17 mmol/L — ABNORMAL LOW (ref 20–29)
Calcium: 9.4 mg/dL (ref 8.6–10.2)
Chloride: 109 mmol/L — ABNORMAL HIGH (ref 96–106)
Creatinine, Ser: 2.54 mg/dL — ABNORMAL HIGH (ref 0.76–1.27)
Glucose: 145 mg/dL — ABNORMAL HIGH (ref 70–99)
Potassium: 5 mmol/L (ref 3.5–5.2)
Sodium: 140 mmol/L (ref 134–144)
eGFR: 25 mL/min/{1.73_m2} — ABNORMAL LOW (ref >59)

## 2021-08-16 ENCOUNTER — Other Ambulatory Visit: Payer: Self-pay

## 2021-08-16 MED ORDER — FUROSEMIDE 40 MG PO TABS: 40.0000 mg | ORAL_TABLET | Freq: Every day | ORAL | Status: DC

## 2021-08-19 ENCOUNTER — Ambulatory Visit (HOSPITAL_COMMUNITY): Payer: Medicare Other | Admitting: Anesthesiology

## 2021-08-19 ENCOUNTER — Ambulatory Visit (HOSPITAL_COMMUNITY)
Admission: RE | Admit: 2021-08-19 | Discharge: 2021-08-19 | Disposition: A | Discharge status: Home / Self Care | Payer: Medicare Other | Attending: Cardiology | Admitting: Cardiology

## 2021-08-19 ENCOUNTER — Ambulatory Visit (HOSPITAL_BASED_OUTPATIENT_CLINIC_OR_DEPARTMENT_OTHER): Payer: Medicare Other | Admitting: Anesthesiology

## 2021-08-19 ENCOUNTER — Encounter (HOSPITAL_COMMUNITY)
Admission: RE | Disposition: A | Discharge status: Home / Self Care | Payer: Self-pay | Source: Home / Self Care | Attending: Cardiology

## 2021-08-19 ENCOUNTER — Encounter (HOSPITAL_COMMUNITY): Payer: Self-pay | Admitting: Cardiology

## 2021-08-19 DIAGNOSIS — I130 Hypertensive heart and chronic kidney disease with heart failure and stage 1 through stage 4 chronic kidney disease, or unspecified chronic kidney disease: Secondary | ICD-10-CM | POA: Insufficient documentation

## 2021-08-19 DIAGNOSIS — Z79899 Other long term (current) drug therapy: Secondary | ICD-10-CM | POA: Insufficient documentation

## 2021-08-19 DIAGNOSIS — I480 Paroxysmal atrial fibrillation: Secondary | ICD-10-CM

## 2021-08-19 DIAGNOSIS — I509 Heart failure, unspecified: Secondary | ICD-10-CM

## 2021-08-19 DIAGNOSIS — I110 Hypertensive heart disease with heart failure: Secondary | ICD-10-CM

## 2021-08-19 DIAGNOSIS — I2510 Atherosclerotic heart disease of native coronary artery without angina pectoris: Secondary | ICD-10-CM

## 2021-08-19 DIAGNOSIS — I340 Nonrheumatic mitral (valve) insufficiency: Secondary | ICD-10-CM | POA: Insufficient documentation

## 2021-08-19 DIAGNOSIS — I428 Other cardiomyopathies: Secondary | ICD-10-CM | POA: Diagnosis not present

## 2021-08-19 DIAGNOSIS — I4891 Unspecified atrial fibrillation: Secondary | ICD-10-CM | POA: Diagnosis not present

## 2021-08-19 DIAGNOSIS — N183 Chronic kidney disease, stage 3 unspecified: Secondary | ICD-10-CM | POA: Insufficient documentation

## 2021-08-19 HISTORY — PX: CARDIOVERSION: SHX1299

## 2021-08-19 LAB — POCT I-STAT, CHEM 8
BUN: 68 mg/dL — ABNORMAL HIGH (ref 8–23)
Calcium, Ion: 1.28 mmol/L (ref 1.15–1.40)
Chloride: 112 mmol/L — ABNORMAL HIGH (ref 98–111)
Creatinine, Ser: 3 mg/dL — ABNORMAL HIGH (ref 0.61–1.24)
Glucose, Bld: 116 mg/dL — ABNORMAL HIGH (ref 70–99)
HCT: 35 % — ABNORMAL LOW (ref 39.0–52.0)
Hemoglobin: 11.9 g/dL — ABNORMAL LOW (ref 13.0–17.0)
Potassium: 4.8 mmol/L (ref 3.5–5.1)
Sodium: 139 mmol/L (ref 135–145)
TCO2: 21 mmol/L — ABNORMAL LOW (ref 22–32)

## 2021-08-19 SURGERY — CARDIOVERSION
Anesthesia: General

## 2021-08-19 MED ORDER — LIDOCAINE 2% (20 MG/ML) 5 ML SYRINGE
INTRAMUSCULAR | Status: DC | PRN
  Administered 2021-08-19: 40 mg via INTRAVENOUS

## 2021-08-19 MED ORDER — PROPOFOL 10 MG/ML IV BOLUS
INTRAVENOUS | Status: DC | PRN
Start: 2021-08-19 — End: 2021-08-19
  Administered 2021-08-19: 60 mg via INTRAVENOUS

## 2021-08-19 MED ORDER — SODIUM CHLORIDE 0.9 % IV SOLN: INTRAVENOUS | Status: DC

## 2021-08-19 MED ORDER — SODIUM CHLORIDE 0.9 % IV SOLN
INTRAVENOUS | Status: DC
Start: 2021-08-19 — End: 2021-08-19

## 2021-08-19 NOTE — Anesthesia Postprocedure Evaluation (Signed)
Anesthesia Post Note  Patient: Charles [name]  Procedure(s) Performed: CARDIOVERSION     Patient location during evaluation: Endoscopy Anesthesia Type: General Level of consciousness: awake Pain management: pain level controlled Vital Signs Assessment: post-procedure vital signs reviewed and stable Respiratory status: spontaneous breathing Cardiovascular status: stable Postop Assessment: no apparent nausea or vomiting Anesthetic complications: no   No notable events documented.  Last Vitals:  Vitals:   08/19/21 0656 08/19/21 0748  BP: 137/79 (!) 104/45  Pulse: (!) 116 (!) 59  Resp: 14 20  Temp: 36.6 C 36.7 C  SpO2:  100%    Last Pain:  Vitals:   08/19/21 0748  TempSrc: Oral  PainSc: 0-No pain                 [name]

## 2021-08-19 NOTE — Transfer of Care (Signed)
Immediate Anesthesia Transfer of Care Note  Patient: Charles [name]  Procedure(s) Performed: CARDIOVERSION  Patient Location: PACU and Endoscopy Unit  Anesthesia Type:General  Level of Consciousness: drowsy  Airway & Oxygen Therapy: Patient Spontanous Breathing  Post-op Assessment: Report given to RN and Post -op Vital signs reviewed and stable  Post vital signs: Reviewed and stable  Last Vitals:  Vitals Value Taken Time  BP    Temp    Pulse    Resp    SpO2      Last Pain:  Vitals:   08/19/21 0656  TempSrc: Oral  PainSc: 0-No pain         Complications: No notable events documented.

## 2021-08-19 NOTE — CV Procedure (Signed)
° ° °  Electrical Cardioversion Procedure Note EDYN NORDELL [id number] 07-07-1940  Procedure: Electrical Cardioversion Indications:  Atrial Fibrillation  Time Out: Verified patient identification, verified procedure,medications/allergies/relevent history reviewed, required imaging and test results available.  Performed  Procedure Details  The patient was NPO after midnight. Anesthesia was administered at the beside  by Dr.Hatchett with 60mg  of propofol.  Cardioversion was performed with synchronized biphasic defibrillation via AP pads with 200 joules.  1 attempt(s) were performed.  The patient converted to normal sinus rhythm. The patient tolerated the procedure well   IMPRESSION:  Successful cardioversion of atrial fibrillation    Candee Furbish 08/19/2021, 8:25 AM

## 2021-08-19 NOTE — Interval H&P Note (Signed)
History and Physical Interval Note:  08/19/2021 7:36 AM  Charles [name]  has presented today for surgery, with the diagnosis of AFIB.  The various methods of treatment have been discussed with the patient and family. After consideration of risks, benefits and other options for treatment, the patient has consented to  Procedure(s): CARDIOVERSION (N/A) as a surgical intervention.  The patient's history has been reviewed, patient examined, no change in status, stable for surgery.  I have reviewed the patient's chart and labs.  Questions were answered to the patient's satisfaction.     [employer]

## 2021-08-19 NOTE — Anesthesia Preprocedure Evaluation (Signed)
Anesthesia Evaluation  Patient identified by MRN, date of birth, ID band Patient awake    Reviewed: Allergy & Precautions, NPO status , Patient's Chart, lab work & pertinent test results, reviewed documented beta blocker date and time   Airway Mallampati: I  TM Distance: >3 FB Neck ROM: Full    Dental  (+) Caps, Dental Advisory Given, Missing,    Pulmonary shortness of breath and with exertion, sleep apnea ,    Pulmonary exam normal breath sounds clear to auscultation       Cardiovascular hypertension, Pt. on medications + CAD, +CHF and + Orthopnea  + dysrhythmias Atrial Fibrillation  Rhythm:Irregular Rate:Normal     Neuro/Psych negative neurological ROS  negative psych ROS   GI/Hepatic hiatal hernia, GERD  Medicated and Controlled,  Endo/Other  Hypothyroidism   Renal/GU Renal InsufficiencyRenal disease  negative genitourinary   Musculoskeletal  (+) Arthritis , Osteoarthritis,    Abdominal   Peds  Hematology  (+) Blood dyscrasia, anemia , [name] therapy- last dose yesterday pm   Anesthesia Other Findings   Reproductive/Obstetrics                             Anesthesia Physical  Anesthesia Plan  ASA: III  Anesthesia Plan: General   Post-op Pain Management:    Induction: Intravenous  PONV Risk Score and Plan: 2 and Treatment may vary due to age or medical condition, Propofol infusion and TIVA  Airway Management Planned: Natural Airway and Simple Face Mask  Additional Equipment: None  Intra-op Plan:   Post-operative Plan:   Informed Consent: I have reviewed the patients History and Physical, chart, labs and discussed the procedure including the risks, benefits and alternatives for the proposed anesthesia with the patient or authorized representative who has indicated his/her understanding and acceptance.     Dental advisory given  Plan Discussed with: CRNA  Anesthesia  Plan Comments:         Anesthesia Quick Evaluation

## 2021-08-21 ENCOUNTER — Encounter (HOSPITAL_COMMUNITY): Payer: Self-pay | Admitting: Cardiology

## 2021-08-26 NOTE — Progress Notes (Unsigned)
HPI:FU atrial fibrillation. He has a past history of paroxysmal atrial fibrillation and tachycardia mediated cardiomyopathy. He underwent mapping and ablation of atrial fib in 11/2012 by Dr. [name]. Carotid dopplers April 2017 normal. At previous office visit he wanted to try to discontinue amiodarone.  However he contacted the office and felt to be back in atrial fibrillation. Amiodarone was resumed. Last echocardiogram December 2021 showed normal LV function, grade 2 diastolic dysfunction, moderate left atrial enlargement.  Patient had successful cardioversion 08/19/21.  Since last seen,  Current Outpatient Medications  Medication Sig Dispense Refill   acetaminophen (TYLENOL) 650 MG CR tablet Take 650 mg by mouth every 8 (eight) hours as needed for pain.     Acetylcysteine (NAC) 600 MG CAPS Take 600 mg by mouth daily.     amiodarone (PACERONE) 200 MG tablet Take 1 tablet (200 mg total) by mouth daily. TAKE 400MG  FOR 1 WEEK THEN BACK TO 200MG  (Patient taking differently: Take 200 mg by mouth daily.) 35 tablet 3   Ascorbic Acid (VITAMIN C) 500 MG tablet Take 1,000 mg by mouth 2 (two) times daily.     Cholecalciferol (VITAMIN D) 125 MCG (5000 UT) CAPS Take 5,000 Units by mouth daily.     Coenzyme Q10 (COQ10) 100 MG CAPS Take 100 mg by mouth every morning.     edoxaban ([name]) 60 MG TABS tablet Take 60 mg by mouth daily. 90 tablet 3   enalapril (VASOTEC) 20 MG tablet Take 0.5 tablets (10 mg total) by mouth at bedtime. 0.5  Tablet at Bedtime 45 tablet 2   EQ MAGNESIUM CITRATE PO Take 420 mg by mouth daily.     furosemide (LASIX) 40 MG tablet Take 1 tablet (40 mg total) by mouth daily.     IRON PO Take 1 tablet by mouth daily.     levothyroxine (SYNTHROID) 125 MCG tablet Take 1 tablet (125 mcg total) by mouth daily before breakfast. 90 tablet 3   Misc Natural Products (OSTEO BI-FLEX JOINT SHIELD PO) Take 2 tablets by mouth every morning.     Multiple Vitamin (MULTIVITAMIN) tablet Take 1 tablet  by mouth daily.     Omega-3 Fatty Acids (FISH OIL) 1000 MG CAPS Take 1,000 mg by mouth 2 (two) times daily.     omeprazole (PRILOSEC) 40 MG capsule TAKE ONE CAPSULE BY MOUTH DAILY 90 capsule 3   polyvinyl alcohol (LIQUIFILM TEARS) 1.4 % ophthalmic solution Place 1 drop into both eyes daily as needed for dry eyes.     zinc gluconate 50 MG tablet Take 50 mg by mouth daily.     No current facility-administered medications for this visit.     Past Medical History:  Diagnosis Date   Arthritis    Atrial dilatation    mild biatrial dilitation   Bruises easily    CHF (congestive heart failure) (HCC)    CKD (chronic kidney disease), stage III (HCC)    Coronary artery disease    Dysrhythmia    atrial fibrillation   GERD (gastroesophageal reflux disease)    H/O hiatal hernia    Hypercholesterolemia    Hyperglycemia    Hypertension    Hypothyroidism    Mitral regurgitation    a. Mild by echo in 01/2014.   Nonischemic cardiomyopathy (HCC)    a. presumed to be tachycardia mediated. Varying EFs over the years from 35-40% to normal and back.   Paroxysmal atrial fibrillation (HCC)    a. H/o difficult to control. b.  s/p PVI ablation 12-03-2012 by [name] with recurrence afterwards, requiring period of amiodarone.   Peripheral edema    Sleep apnea    USES C PAP   Tear of medial meniscus of left knee    Tremor     Past Surgical History:  Procedure Laterality Date   ATRIAL FIBRILLATION ABLATION N/A 12/03/2012   Procedure: ATRIAL FIBRILLATION ABLATION;  Surgeon: Hillis Range, MD;  Location: [facility] CATH LAB;  Service: Cardiovascular;  Laterality: N/A;   ATRIAL FIBRILLATION ABLATION     PVI by [name]   CARDIAC CATHETERIZATION     CARDIOVERSION N/A 09/04/2012   Procedure: CARDIOVERSION;  Surgeon: [name], MD;  Location: [facility] ENDOSCOPY;  Service: Cardiovascular;  Laterality: N/A;   CARDIOVERSION N/A 02/11/2013   Procedure: CARDIOVERSION;  Surgeon: [name], MD;  Location: [facility]  ENDOSCOPY;  Service: Cardiovascular;  Laterality: N/A;   CARDIOVERSION N/A 12/07/2015   Procedure: CARDIOVERSION;  Surgeon: Chrystie Nose, MD;  Location: [facility] ENDOSCOPY;  Service: Cardiovascular;  Laterality: N/A;   CARDIOVERSION N/A 03/11/2018   Procedure: CARDIOVERSION;  Surgeon: Lars Masson, MD;  Location: [facility] ENDOSCOPY;  Service: Cardiovascular;  Laterality: N/A;   CARDIOVERSION N/A 09/23/2020   Procedure: CARDIOVERSION;  Surgeon: [name], MD;  Location: [facility] ENDOSCOPY;  Service: Cardiovascular;  Laterality: N/A;   CARDIOVERSION N/A 08/19/2021   Procedure: CARDIOVERSION;  Surgeon: Jake Bathe, MD;  Location: [facility] ENDOSCOPY;  Service: Cardiovascular;  Laterality: N/A;   CATARACT EXTRACTION, BILATERAL     EYE SURGERY  2015,2016   bilateral cataract surgery with lens implants   HERNIA REPAIR  03/2005   JOINT REPLACEMENT  2005   total right hip replacement   kidney stent  1987, 02/2005   KIDNEY STONE SURGERY     2 stents placed   KNEE ARTHROSCOPY WITH DRILLING/MICROFRACTURE Left 11/20/2013   Procedure: KNEE ARTHROSCOPY WITH DRILLING/MICROFRACTURE;  Surgeon: Jacki Cones, MD;  Location: [facility] ORS;  Service: Orthopedics;  Laterality: Left;   KNEE ARTHROSCOPY WITH MEDIAL MENISECTOMY Left 11/20/2013   Procedure: LEFT KNEE ARTHROSCOPY WITH MEDIAL MENISECTOMY;  Surgeon: Jacki Cones, MD;  Location: [facility] ORS;  Service: Orthopedics;  Laterality: Left;  Microfracture of medial femoral chondyle and abrasion chondroplasty of the medial femoral chondyle   LITHOTRIPSY     x 2   PICC line placement  2007   quadrec  1999   LFT leg quad sx   right hip surgery  2007   irrigation and debridement from bacteria   TEE WITHOUT CARDIOVERSION N/A 12/02/2012   Procedure: TRANSESOPHAGEAL ECHOCARDIOGRAM (TEE);  Surgeon: [name], MD;  Location: [facility] ENDOSCOPY;  Service: Cardiovascular;  Laterality: N/A;   TOTAL HIP ARTHROPLASTY Left 08/12/2015   Procedure: LEFT TOTAL HIP ARTHROPLASTY;  Surgeon: Ranee Gosselin, MD;  Location: [facility]S;  Service: Orthopedics;  Laterality: Left;    Social History   Socioeconomic History   Marital status: Married    Spouse name: Kathie Rhodes   Number of children: 2   Years of education: Not on file   Highest education level: Some college, no degree  Occupational History   Occupation: Recruitment consultant: RETIRED  Tobacco Use   Smoking status: Never   Smokeless tobacco: Never  Vaping Use   Vaping Use: Never used  Substance and Sexual Activity   Alcohol use: No   Drug use: No   Sexual activity: Not on file  Other Topics Concern   Not on file  Social History Narrative  Lives in [place] with spouse.  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   [place] [facility]:   Has worked extensively in [employer]. Significant asbestos asbestos exposure. No bird or mold exposure.       Right handed   Social Determinants of Health   Financial Resource Strain: Not on file  Food Insecurity: Not on file  Transportation Needs: Not on file  Physical Activity: Not on file  Stress: Not on file  Social Connections: Not on file  Intimate Partner Violence: Not on file    Family History  Problem Relation Age of Onset   Cancer Mother 36       CANCER   Heart disease Father 30       HEART PROBLEMS   Lupus Brother        "of the skin"    ROS: no fevers or chills, productive cough, hemoptysis, dysphasia, odynophagia, melena, hematochezia, dysuria, hematuria, rash, seizure activity, orthopnea, PND, pedal edema, claudication. Remaining systems are negative.  Physical Exam: Well-developed well-nourished in no acute distress.  Skin is warm and dry.  HEENT is normal.  Neck is supple.  Chest is clear to auscultation with normal expansion.  Cardiovascular exam is regular rate and rhythm.  Abdominal exam nontender or distended. No masses palpated. Extremities show no edema. neuro grossly intact  ECG- personally  reviewed  A/P  1 paroxysmal atrial fibrillation-patient remains in sinus rhythm status post recent cardioversion.  Continue amiodarone and edoxaban.  2 hypertension-patient's blood pressure is controlled.  Continue present medical regimen.  3 mitral regurgitation-trace on most recent echocardiogram.  4 chronic stage III kidney disease  Kirk Ruths, MD

## 2021-09-05 ENCOUNTER — Encounter: Payer: Self-pay | Admitting: Cardiology

## 2021-09-08 ENCOUNTER — Ambulatory Visit: Payer: Medicare Other | Admitting: Cardiology

## 2021-09-08 ENCOUNTER — Other Ambulatory Visit: Payer: Self-pay

## 2021-09-08 ENCOUNTER — Encounter: Payer: Self-pay | Admitting: Cardiology

## 2021-09-08 VITALS — BP 148/68 | HR 54 | Ht 70.0 in | Wt 180.2 lb

## 2021-09-08 DIAGNOSIS — I10 Essential (primary) hypertension: Secondary | ICD-10-CM | POA: Diagnosis not present

## 2021-09-08 DIAGNOSIS — N1832 Chronic kidney disease, stage 3b: Secondary | ICD-10-CM | POA: Diagnosis not present

## 2021-09-08 DIAGNOSIS — I480 Paroxysmal atrial fibrillation: Secondary | ICD-10-CM | POA: Diagnosis not present

## 2021-09-08 DIAGNOSIS — I340 Nonrheumatic mitral (valve) insufficiency: Secondary | ICD-10-CM | POA: Diagnosis not present

## 2021-09-08 NOTE — Patient Instructions (Signed)
?  Testing/Procedures: ? ?A chest x-ray takes a picture of the organs and structures inside the chest, including the heart, lungs, and blood vessels. This test can show several things, including, whether the heart is enlarges; whether fluid is building up in the lungs; and whether pacemaker / defibrillator leads are still in place. [facility] ? ? ?Follow-Up: ?At [facility], you and your health needs are our priority.  As part of our continuing mission to provide you with exceptional heart care, we have created designated Provider Care Teams.  These Care Teams include your primary Cardiologist (physician) and Advanced Practice Providers (APPs -  Physician Assistants and Nurse Practitioners) who all work together to provide you with the care you need, when you need it. ? ?We recommend signing up for the patient portal called "MyChart".  Sign up information is provided on this After Visit Summary.  MyChart is used to connect with patients for Virtual Visits (Telemedicine).  Patients are able to view lab/test results, encounter notes, upcoming appointments, etc.  Non-urgent messages can be sent to your provider as well.   ?To learn more about what you can do with MyChart, go to [email or web address].   ? ?Your next appointment:   ?6 month(s) ? ?The format for your next appointment:   ?In Person ? ?Provider:   ?Olga Millers MD  ? ? ?

## 2021-09-09 ENCOUNTER — Ambulatory Visit
Admission: RE | Admit: 2021-09-09 | Discharge: 2021-09-09 | Disposition: A | Discharge status: Home / Self Care | Payer: Medicare Other | Source: Ambulatory Visit | Attending: Cardiology | Admitting: Cardiology

## 2021-09-09 DIAGNOSIS — I480 Paroxysmal atrial fibrillation: Secondary | ICD-10-CM | POA: Diagnosis not present

## 2021-09-09 DIAGNOSIS — J929 Pleural plaque without asbestos: Secondary | ICD-10-CM | POA: Diagnosis not present

## 2021-09-09 LAB — HEPATIC FUNCTION PANEL
ALT: 17 IU/L (ref 0–44)
AST: 21 IU/L (ref 0–40)
Albumin: 4.6 g/dL (ref 3.6–4.6)
Alkaline Phosphatase: 72 IU/L (ref 44–121)
Bilirubin Total: 0.3 mg/dL (ref 0.0–1.2)
Bilirubin, Direct: 0.11 mg/dL (ref 0.00–0.40)
Total Protein: 6.9 g/dL (ref 6.0–8.5)

## 2021-09-09 LAB — TSH: TSH: 1.28 u[IU]/mL (ref 0.450–4.500)

## 2021-09-14 ENCOUNTER — Encounter: Payer: Self-pay | Admitting: *Deleted

## 2021-11-08 ENCOUNTER — Other Ambulatory Visit: Payer: Self-pay

## 2021-11-08 ENCOUNTER — Encounter: Payer: Self-pay | Admitting: Cardiology

## 2021-11-08 MED ORDER — AMLODIPINE BESYLATE 5 MG PO TABS: 5.0000 mg | ORAL_TABLET | Freq: Every day | ORAL | 3 refills | Status: DC

## 2021-11-08 NOTE — Telephone Encounter (Signed)
Patient's wife is concerned about patient's BP being elevated: ranging from 139/60 to 175/78. This am 179/85, P 68. Denies headache, weakness, palpitations, chest pain. Patient takes vasotec 10 mg at bedtime. Please advise. ? ? ?

## 2021-11-30 ENCOUNTER — Other Ambulatory Visit: Payer: Self-pay | Admitting: Cardiology

## 2021-11-30 DIAGNOSIS — I4819 Other persistent atrial fibrillation: Secondary | ICD-10-CM

## 2021-12-07 ENCOUNTER — Encounter: Payer: Self-pay | Admitting: Cardiology

## 2021-12-08 ENCOUNTER — Other Ambulatory Visit: Payer: Self-pay

## 2021-12-08 MED ORDER — AMLODIPINE BESYLATE 10 MG PO TABS: 10.0000 mg | ORAL_TABLET | Freq: Every day | ORAL | 3 refills | Status: DC

## 2021-12-29 DIAGNOSIS — I509 Heart failure, unspecified: Secondary | ICD-10-CM | POA: Diagnosis not present

## 2021-12-29 DIAGNOSIS — N183 Chronic kidney disease, stage 3 unspecified: Secondary | ICD-10-CM | POA: Diagnosis not present

## 2021-12-29 DIAGNOSIS — E7800 Pure hypercholesterolemia, unspecified: Secondary | ICD-10-CM | POA: Diagnosis not present

## 2021-12-29 DIAGNOSIS — I10 Essential (primary) hypertension: Secondary | ICD-10-CM | POA: Diagnosis not present

## 2022-02-17 NOTE — Progress Notes (Addendum)
HPI: FU atrial fibrillation. He has a past history of paroxysmal atrial fibrillation and tachycardia mediated cardiomyopathy. He underwent mapping and ablation of atrial fib in 11/2012 by Dr. Rayann Heman. Carotid dopplers April 2017 normal. At previous office visit he wanted to try to discontinue amiodarone.  However he contacted the office and felt to be back in atrial fibrillation. Amiodarone was resumed. Last echocardiogram December 2021 showed normal LV function, grade 2 diastolic dysfunction, moderate left atrial enlargement. Patient had successful cardioversion 08/19/21.  Since last seen, he has dyspnea with more vigorous activities but not routine activities.  No orthopnea, PND, chest pain or syncope.  In the past week he has noticed worsening pedal edema.  Current Outpatient Medications  Medication Sig Dispense Refill   acetaminophen (TYLENOL) 650 MG CR tablet Take 650 mg by mouth every 8 (eight) hours as needed for pain.     amiodarone (PACERONE) 200 MG tablet TAKE ONE TABLET BY MOUTH DAILY 90 tablet 3   amLODipine (NORVASC) 10 MG tablet Take 1 tablet (10 mg total) by mouth daily. 90 tablet 3   Ascorbic Acid (VITAMIN C) 500 MG tablet Take 1,000 mg by mouth 2 (two) times daily.     Cholecalciferol (VITAMIN D) 125 MCG (5000 UT) CAPS Take 5,000 Units by mouth daily.     Coenzyme Q10 (COQ10) 100 MG CAPS Take 100 mg by mouth every morning.     edoxaban ([name]) 60 MG TABS tablet Take 60 mg by mouth daily. 90 tablet 3   enalapril (VASOTEC) 20 MG tablet TAKE 0.5 TABLETS BY MOUTH AT BEDTIME 45 tablet 2   EQ MAGNESIUM CITRATE PO Take 420 mg by mouth daily.     furosemide (LASIX) 40 MG tablet Take 1 tablet (40 mg total) by mouth daily.     IRON PO Take 1 tablet by mouth daily.     levothyroxine (SYNTHROID) 125 MCG tablet Take 1 tablet (125 mcg total) by mouth daily before breakfast. 90 tablet 3   Misc Natural Products (OSTEO BI-FLEX JOINT SHIELD PO) Take 2 tablets by mouth every morning.      Multiple Vitamin (MULTIVITAMIN) tablet Take 1 tablet by mouth daily.     Omega-3 Fatty Acids (FISH OIL) 1000 MG CAPS Take 1,000 mg by mouth 2 (two) times daily.     omeprazole (PRILOSEC) 40 MG capsule TAKE ONE CAPSULE BY MOUTH DAILY 90 capsule 3   polyvinyl alcohol (LIQUIFILM TEARS) 1.4 % ophthalmic solution Place 1 drop into both eyes daily as needed for dry eyes.     zinc gluconate 50 MG tablet Take 50 mg by mouth daily.     No current facility-administered medications for this visit.     Past Medical History:  Diagnosis Date   Arthritis    Atrial dilatation    mild biatrial dilitation   Bruises easily    CHF (congestive heart failure) (HCC)    CKD (chronic kidney disease), stage III (HCC)    Coronary artery disease    Dysrhythmia    atrial fibrillation   GERD (gastroesophageal reflux disease)    H/O hiatal hernia    Hypercholesterolemia    Hyperglycemia    Hypertension    Hypothyroidism    Mitral regurgitation    a. Mild by echo in 01/2014.   Nonischemic cardiomyopathy ([place])    a. presumed to be tachycardia mediated. Varying EFs over the years from 35-40% to normal and back.   Paroxysmal atrial fibrillation ([place])    a.  H/o difficult to control. b. s/p PVI ablation 12-03-2012 by [name] with recurrence afterwards, requiring period of amiodarone.   Peripheral edema    Sleep apnea    USES C PAP   Tear of medial meniscus of left knee    Tremor     Past Surgical History:  Procedure Laterality Date   ATRIAL FIBRILLATION ABLATION N/A 12/03/2012   Procedure: ATRIAL FIBRILLATION ABLATION;  Surgeon: Hillis Range, MD;  Location: [facility] CATH LAB;  Service: Cardiovascular;  Laterality: N/A;   ATRIAL FIBRILLATION ABLATION     PVI by [name]   CARDIAC CATHETERIZATION     CARDIOVERSION N/A 09/04/2012   Procedure: CARDIOVERSION;  Surgeon: [name], MD;  Location: [facility] ENDOSCOPY;  Service: Cardiovascular;  Laterality: N/A;   CARDIOVERSION N/A 02/11/2013   Procedure: CARDIOVERSION;   Surgeon: [name], MD;  Location: [facility] ENDOSCOPY;  Service: Cardiovascular;  Laterality: N/A;   CARDIOVERSION N/A 12/07/2015   Procedure: CARDIOVERSION;  Surgeon: Chrystie Nose, MD;  Location: [facility] ENDOSCOPY;  Service: Cardiovascular;  Laterality: N/A;   CARDIOVERSION N/A 03/11/2018   Procedure: CARDIOVERSION;  Surgeon: Lars Masson, MD;  Location: [facility] ENDOSCOPY;  Service: Cardiovascular;  Laterality: N/A;   CARDIOVERSION N/A 09/23/2020   Procedure: CARDIOVERSION;  Surgeon: [name], MD;  Location: [facility] ENDOSCOPY;  Service: Cardiovascular;  Laterality: N/A;   CARDIOVERSION N/A 08/19/2021   Procedure: CARDIOVERSION;  Surgeon: Jake Bathe, MD;  Location: [facility] ENDOSCOPY;  Service: Cardiovascular;  Laterality: N/A;   CATARACT EXTRACTION, BILATERAL     EYE SURGERY  2015,2016   bilateral cataract surgery with lens implants   HERNIA REPAIR  03/2005   JOINT REPLACEMENT  2005   total right hip replacement   kidney stent  1987, 02/2005   KIDNEY STONE SURGERY     2 stents placed   KNEE ARTHROSCOPY WITH DRILLING/MICROFRACTURE Left 11/20/2013   Procedure: KNEE ARTHROSCOPY WITH DRILLING/MICROFRACTURE;  Surgeon: Jacki Cones, MD;  Location: [facility];  Service: Orthopedics;  Laterality: Left;   KNEE ARTHROSCOPY WITH MEDIAL MENISECTOMY Left 11/20/2013   Procedure: LEFT KNEE ARTHROSCOPY WITH MEDIAL MENISECTOMY;  Surgeon: Jacki Cones, MD;  Location: [facility];  Service: Orthopedics;  Laterality: Left;  Microfracture of medial femoral chondyle and abrasion chondroplasty of the medial femoral chondyle   LITHOTRIPSY     x 2   PICC line placement  2007   quadrec  1999   LFT leg quad sx   right hip surgery  2007   irrigation and debridement from bacteria   TEE WITHOUT CARDIOVERSION N/A 12/02/2012   Procedure: TRANSESOPHAGEAL ECHOCARDIOGRAM (TEE);  Surgeon: [name], MD;  Location: [facility] ENDOSCOPY;  Service: Cardiovascular;  Laterality: N/A;   TOTAL HIP ARTHROPLASTY Left 08/12/2015   Procedure:  LEFT TOTAL HIP ARTHROPLASTY;  Surgeon: Ranee Gosselin, MD;  Location: [facility];  Service: Orthopedics;  Laterality: Left;    Social History   Socioeconomic History   Marital status: Married    Spouse name: Kathie Rhodes   Number of children: 2   Years of education: Not on file   Highest education level: Some college, no degree  Occupational History   Occupation: Recruitment consultant: RETIRED  Tobacco Use   Smoking status: Never   Smokeless tobacco: Never  Vaping Use   Vaping Use: Never used  Substance and Sexual Activity   Alcohol use: No   Drug use: No   Sexual activity: Not on file  Other Topics Concern   Not on  file  Social History Narrative   Lives in [place] with spouse.  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   [employer] [facility]:   Has worked extensively in [employer]. Significant asbestos asbestos exposure. No bird or mold exposure.       Right handed   Social Determinants of Health   Financial Resource Strain: Not on file  Food Insecurity: Not on file  Transportation Needs: Not on file  Physical Activity: Not on file  Stress: Not on file  Social Connections: Not on file  Intimate Partner Violence: Not on file    Family History  Problem Relation Age of Onset   Cancer Mother 62       CANCER   Heart disease Father 81       HEART PROBLEMS   Lupus Brother        "of the skin"    ROS: no fevers or chills, productive cough, hemoptysis, dysphasia, odynophagia, melena, hematochezia, dysuria, hematuria, rash, seizure activity, orthopnea, PND, claudication. Remaining systems are negative.  Physical Exam: Well-developed well-nourished in no acute distress.  Skin is warm and dry.  HEENT is normal.  Neck is supple.  Chest is clear to auscultation with normal expansion.  Cardiovascular exam is regular rate and rhythm.  Abdominal exam nontender or distended. No masses palpated. Extremities show 1+ edema. neuro  grossly intact  ECG-normal sinus rhythm, nonspecific ST changes.  Personally reviewed  A/P  1 paroxysmal atrial fibrillation-patient remains in sinus rhythm.  Continue amiodarone and edoxaban.  2 hypertension-patient's blood pressure is controlled.  Continue present medications and follow.  3 history of mitral regurgitation-trace on most recent echocardiogram.  4 chronic stage IIIb kidney disease-patient has a history of chronic stage IIIb kidney disease.  Will recheck potassium and renal function in 1 week.  If significantly elevated will ask nephrology to follow.  5 lower extremity edema-patient has noticed worsening lower extremity edema over the past week.  Will increase Lasix to 40 mg daily.  Check potassium and renal function 1 week.  Repeat echocardiogram.  Olga Millers, MD

## 2022-03-02 ENCOUNTER — Ambulatory Visit: Payer: Medicare Other | Attending: Cardiology | Admitting: Cardiology

## 2022-03-02 ENCOUNTER — Encounter: Payer: Self-pay | Admitting: Cardiology

## 2022-03-02 VITALS — BP 138/60 | HR 64 | Ht 70.0 in | Wt 181.4 lb

## 2022-03-02 DIAGNOSIS — R609 Edema, unspecified: Secondary | ICD-10-CM

## 2022-03-02 DIAGNOSIS — I480 Paroxysmal atrial fibrillation: Secondary | ICD-10-CM

## 2022-03-02 DIAGNOSIS — N1832 Chronic kidney disease, stage 3b: Secondary | ICD-10-CM

## 2022-03-02 DIAGNOSIS — I10 Essential (primary) hypertension: Secondary | ICD-10-CM

## 2022-03-02 NOTE — Patient Instructions (Signed)
Medication Instructions:   INCREASE FUROSEMIDE TO 40 MG ONCE DAILY=1 WHOLE TABLET ONCE DAILY  *If you need a refill on your cardiac medications before your next appointment, please call your pharmacy*   Lab Work:  Your physician recommends that you return for lab work in: ONE WEEK-DO NOT NEED TO FAST  If you have labs (blood work) drawn today and your tests are completely normal, you will receive your results only by: MyChart Message (if you have MyChart) OR A paper copy in the mail If you have any lab test that is abnormal or we need to change your treatment, we will call you to review the results.   Testing/Procedures:  Your physician has requested that you have an echocardiogram. Echocardiography is a painless test that uses sound waves to create images of your heart. It provides your doctor with information about the size and shape of your heart and how well your heart's chambers and valves are working. This procedure takes approximately one hour. There are no restrictions for this procedure. [address]   Follow-Up: At [facility], you and your health needs are our priority.  As part of our continuing mission to provide you with exceptional heart care, we have created designated Provider Care Teams.  These Care Teams include your primary Cardiologist (physician) and Advanced Practice Providers (APPs -  Physician Assistants and Nurse Practitioners) who all work together to provide you with the care you need, when you need it.  We recommend signing up for the patient portal called "MyChart".  Sign up information is provided on this After Visit Summary.  MyChart is used to connect with patients for Virtual Visits (Telemedicine).  Patients are able to view lab/test results, encounter notes, upcoming appointments, etc.  Non-urgent messages can be sent to your provider as well.   To learn more about what you can do with MyChart, go to [email or web address].    Your  next appointment:   6 month(s)  The format for your next appointment:   In Person  Provider:   Olga Millers MD

## 2022-03-09 DIAGNOSIS — N1832 Chronic kidney disease, stage 3b: Secondary | ICD-10-CM | POA: Diagnosis not present

## 2022-03-09 LAB — BASIC METABOLIC PANEL
BUN/Creatinine Ratio: 18 (ref 10–24)
BUN: 42 mg/dL — ABNORMAL HIGH (ref 8–27)
CO2: 25 mmol/L (ref 20–29)
Calcium: 9.9 mg/dL (ref 8.6–10.2)
Chloride: 103 mmol/L (ref 96–106)
Creatinine, Ser: 2.33 mg/dL — ABNORMAL HIGH (ref 0.76–1.27)
Glucose: 102 mg/dL — ABNORMAL HIGH (ref 70–99)
Potassium: 4.9 mmol/L (ref 3.5–5.2)
Sodium: 142 mmol/L (ref 134–144)
eGFR: 27 mL/min/{1.73_m2} — ABNORMAL LOW (ref >59)

## 2022-03-10 ENCOUNTER — Telehealth: Payer: Self-pay | Admitting: *Deleted

## 2022-03-10 DIAGNOSIS — I4891 Unspecified atrial fibrillation: Secondary | ICD-10-CM

## 2022-03-10 MED ORDER — EDOXABAN TOSYLATE 60 MG PO TABS: 30.0000 mg | ORAL_TABLET | Freq: Every day | ORAL | 3 refills | Status: DC

## 2022-03-10 NOTE — Telephone Encounter (Signed)
-----   Message from [name], MD sent at 03/10/2022  7:30 AM EDT ----- Make sure edoxaban dose is 30 mg daily. Charles [name]

## 2022-03-10 NOTE — Telephone Encounter (Signed)
Confirmed with patient he is taking 1/2 of a 60 mg savaysa tablet once daily.

## 2022-03-16 ENCOUNTER — Ambulatory Visit (HOSPITAL_COMMUNITY): Payer: Medicare Other | Attending: Cardiology

## 2022-03-16 DIAGNOSIS — R609 Edema, unspecified: Secondary | ICD-10-CM | POA: Diagnosis not present

## 2022-03-16 DIAGNOSIS — R600 Localized edema: Secondary | ICD-10-CM | POA: Diagnosis not present

## 2022-03-16 LAB — ECHOCARDIOGRAM COMPLETE: S' Lateral: 4.5 cm

## 2022-03-16 MED ORDER — PERFLUTREN LIPID MICROSPHERE
1.0000 mL | INTRAVENOUS | Status: AC | PRN
  Administered 2022-03-16: 1 mL via INTRAVENOUS

## 2022-04-21 DIAGNOSIS — R7303 Prediabetes: Secondary | ICD-10-CM | POA: Diagnosis not present

## 2022-04-21 DIAGNOSIS — I10 Essential (primary) hypertension: Secondary | ICD-10-CM | POA: Diagnosis not present

## 2022-04-21 DIAGNOSIS — N183 Chronic kidney disease, stage 3 unspecified: Secondary | ICD-10-CM | POA: Diagnosis not present

## 2022-04-21 DIAGNOSIS — E7800 Pure hypercholesterolemia, unspecified: Secondary | ICD-10-CM | POA: Diagnosis not present

## 2022-04-21 DIAGNOSIS — I4891 Unspecified atrial fibrillation: Secondary | ICD-10-CM | POA: Diagnosis not present

## 2022-04-21 DIAGNOSIS — J849 Interstitial pulmonary disease, unspecified: Secondary | ICD-10-CM | POA: Diagnosis not present

## 2022-06-17 ENCOUNTER — Other Ambulatory Visit: Payer: Self-pay | Admitting: Cardiology

## 2022-07-14 ENCOUNTER — Telehealth: Payer: Self-pay

## 2022-07-14 NOTE — Patient Outreach (Signed)
  Care Coordination   07/14/2022 Name: GRACYN SANTILLANES MRN: [id number] DOB: January 25, 1941   Care Coordination Outreach Attempts:  An unsuccessful telephone outreach was attempted today to offer the patient information about available care coordination services as a benefit of their health plan.  Per wife, patient is unable to speak at this time and request to call back.  Follow Up Plan:  Additional outreach attempts will be made to offer the patient care coordination information and services.   Encounter Outcome:  Pt. Request to Call Back   Care Coordination Interventions:  No, not indicated    Thea Silversmith, RN, MSN, BSN, [place] Coordinator [phone]

## 2022-07-21 DIAGNOSIS — N1832 Chronic kidney disease, stage 3b: Secondary | ICD-10-CM | POA: Diagnosis not present

## 2022-07-21 DIAGNOSIS — I10 Essential (primary) hypertension: Secondary | ICD-10-CM | POA: Diagnosis not present

## 2022-07-21 DIAGNOSIS — E039 Hypothyroidism, unspecified: Secondary | ICD-10-CM | POA: Diagnosis not present

## 2022-07-21 DIAGNOSIS — I480 Paroxysmal atrial fibrillation: Secondary | ICD-10-CM | POA: Diagnosis not present

## 2022-08-09 ENCOUNTER — Telehealth: Payer: Self-pay

## 2022-08-09 NOTE — Patient Outreach (Signed)
  Care Coordination   Initial Visit Note   08/09/2022 Name: Charles [name] MRN: [id number] DOB: 06/11/1941  Charles [name] is a 82 y.o. year old male who sees Hilts, Legrand Como, MD for primary care. I spoke with  Charles [name] by phone today.  What matters to the patients health and wellness today?  Declines services.    SDOH assessments and interventions completed:  No  Care Coordination Interventions:  No, not indicated   Follow up plan: No further intervention required.   Encounter Outcome:  Pt. Refused   Charles Silversmith, RN, MSN, BSN, [place] Coordinator [phone]

## 2022-08-16 DIAGNOSIS — I129 Hypertensive chronic kidney disease with stage 1 through stage 4 chronic kidney disease, or unspecified chronic kidney disease: Secondary | ICD-10-CM | POA: Diagnosis not present

## 2022-08-16 DIAGNOSIS — D631 Anemia in chronic kidney disease: Secondary | ICD-10-CM | POA: Diagnosis not present

## 2022-08-16 DIAGNOSIS — N189 Chronic kidney disease, unspecified: Secondary | ICD-10-CM | POA: Diagnosis not present

## 2022-08-16 DIAGNOSIS — N184 Chronic kidney disease, stage 4 (severe): Secondary | ICD-10-CM | POA: Diagnosis not present

## 2022-08-16 DIAGNOSIS — R3129 Other microscopic hematuria: Secondary | ICD-10-CM | POA: Diagnosis not present

## 2022-08-16 DIAGNOSIS — N2581 Secondary hyperparathyroidism of renal origin: Secondary | ICD-10-CM | POA: Diagnosis not present

## 2022-08-22 ENCOUNTER — Other Ambulatory Visit: Payer: Self-pay | Admitting: Nephrology

## 2022-08-22 DIAGNOSIS — N184 Chronic kidney disease, stage 4 (severe): Secondary | ICD-10-CM

## 2022-08-25 ENCOUNTER — Other Ambulatory Visit: Payer: Self-pay | Admitting: Cardiology

## 2022-08-25 ENCOUNTER — Ambulatory Visit
Admission: RE | Admit: 2022-08-25 | Discharge: 2022-08-25 | Disposition: A | Discharge status: Home / Self Care | Payer: Medicare Other | Source: Ambulatory Visit | Attending: Nephrology | Admitting: Nephrology

## 2022-08-25 DIAGNOSIS — N184 Chronic kidney disease, stage 4 (severe): Secondary | ICD-10-CM

## 2022-08-25 DIAGNOSIS — N200 Calculus of kidney: Secondary | ICD-10-CM | POA: Diagnosis not present

## 2022-08-25 DIAGNOSIS — N189 Chronic kidney disease, unspecified: Secondary | ICD-10-CM | POA: Diagnosis not present

## 2022-08-25 DIAGNOSIS — E039 Hypothyroidism, unspecified: Secondary | ICD-10-CM

## 2022-08-29 ENCOUNTER — Other Ambulatory Visit: Payer: Self-pay | Admitting: Cardiology

## 2022-09-08 NOTE — Progress Notes (Unsigned)
HPI: FU atrial fibrillation. He has a past history of paroxysmal atrial fibrillation and tachycardia mediated cardiomyopathy. He underwent mapping and ablation of atrial fib in 11/2012 by Dr. Rayann Heman. Carotid dopplers April 2017 normal. At previous office visit he wanted to try to discontinue amiodarone.  However he contacted the office and felt to be back in atrial fibrillation. Amiodarone was resumed. Echocardiogram September 2023 showed normal LV function, mild left ventricular hypertrophy, grade 2 diastolic dysfunction, mild right ventricular enlargement, severe pulmonary hypertension, severe left atrial enlargement, mild right atrial enlargement, moderate mitral regurgitation, moderate tricuspid regurgitation.  Since last seen, patient denies increased dyspnea, chest pain, palpitations or syncope.  Current Outpatient Medications  Medication Sig Dispense Refill   acetaminophen (TYLENOL) 650 MG CR tablet Take 650 mg by mouth every 8 (eight) hours as needed for pain.     amiodarone (PACERONE) 200 MG tablet TAKE ONE TABLET BY MOUTH DAILY 90 tablet 3   amLODipine (NORVASC) 10 MG tablet Take 1 tablet (10 mg total) by mouth daily. 90 tablet 3   Ascorbic Acid (VITAMIN C) 500 MG tablet Take 1,000 mg by mouth 2 (two) times daily.     Cholecalciferol (VITAMIN D) 125 MCG (5000 UT) CAPS Take 5,000 Units by mouth daily.     Coenzyme Q10 (COQ10) 100 MG CAPS Take 100 mg by mouth every morning.     edoxaban ([name]) 60 MG TABS tablet Take 30 mg by mouth daily. 90 tablet 3   enalapril (VASOTEC) 20 MG tablet TAKE 1/2 TABLET BY MOUTH EVERY NIGHT AT BEDTIME 45 tablet 6   EQ MAGNESIUM CITRATE PO Take 420 mg by mouth daily.     furosemide (LASIX) 40 MG tablet 1 tablet Orally Once a day     IRON PO Take 1 tablet by mouth daily.     levothyroxine (SYNTHROID) 125 MCG tablet TAKE ONE TABLET BY MOUTH DAILY BEFORE BREAKFAST 90 tablet 1   Misc Natural Products (OSTEO BI-FLEX JOINT SHIELD PO) Take 2 tablets by mouth  every morning.     Multiple Vitamin (MULTIVITAMIN) tablet Take 1 tablet by mouth daily.     Omega-3 Fatty Acids (FISH OIL) 1000 MG CAPS Take 1,000 mg by mouth 2 (two) times daily.     omeprazole (PRILOSEC) 40 MG capsule TAKE ONE CAPSULE BY MOUTH DAILY 90 capsule 3   polyvinyl alcohol (LIQUIFILM TEARS) 1.4 % ophthalmic solution Place 1 drop into both eyes daily as needed for dry eyes.     zinc gluconate 50 MG tablet Take 50 mg by mouth daily.     No current facility-administered medications for this visit.     Past Medical History:  Diagnosis Date   Arthritis    Atrial dilatation    mild biatrial dilitation   Bruises easily    CHF (congestive heart failure) (HCC)    CKD (chronic kidney disease), stage III (HCC)    Coronary artery disease    Dysrhythmia    atrial fibrillation   GERD (gastroesophageal reflux disease)    H/O hiatal hernia    Hypercholesterolemia    Hyperglycemia    Hypertension    Hypothyroidism    Mitral regurgitation    a. Mild by echo in 01/2014.   Nonischemic cardiomyopathy ([place])    a. presumed to be tachycardia mediated. Varying EFs over the years from 35-40% to normal and back.   Paroxysmal atrial fibrillation ([place])    a. H/o difficult to control. b. s/p PVI ablation 12-03-2012 by Dr  Allred with recurrence afterwards, requiring period of amiodarone.   Peripheral edema    Sleep apnea    USES C PAP   Tear of medial meniscus of left knee    Tremor     Past Surgical History:  Procedure Laterality Date   ATRIAL FIBRILLATION ABLATION N/A 12/03/2012   Procedure: ATRIAL FIBRILLATION ABLATION;  Surgeon: Thompson Grayer, MD;  Location: [facility] CATH LAB;  Service: Cardiovascular;  Laterality: N/A;   ATRIAL FIBRILLATION ABLATION     PVI by Dr Rayann Heman   CARDIAC CATHETERIZATION     CARDIOVERSION N/A 09/04/2012   Procedure: CARDIOVERSION;  Surgeon: Darlin Coco, MD;  Location: [place];  Service: Cardiovascular;  Laterality: N/A;   CARDIOVERSION N/A 02/11/2013    Procedure: CARDIOVERSION;  Surgeon: Darlin Coco, MD;  Location: [place];  Service: Cardiovascular;  Laterality: N/A;   CARDIOVERSION N/A 12/07/2015   Procedure: CARDIOVERSION;  Surgeon: Pixie Casino, MD;  Location: [place];  Service: Cardiovascular;  Laterality: N/A;   CARDIOVERSION N/A 03/11/2018   Procedure: CARDIOVERSION;  Surgeon: Dorothy Spark, MD;  Location: [facility] ENDOSCOPY;  Service: Cardiovascular;  Laterality: N/A;   CARDIOVERSION N/A 09/23/2020   Procedure: CARDIOVERSION;  Surgeon: Fay Records, MD;  Location: [place];  Service: Cardiovascular;  Laterality: N/A;   CARDIOVERSION N/A 08/19/2021   Procedure: CARDIOVERSION;  Surgeon: Jerline Pain, MD;  Location: [facility] ENDOSCOPY;  Service: Cardiovascular;  Laterality: N/A;   CATARACT EXTRACTION, BILATERAL     EYE SURGERY  2015,2016   bilateral cataract surgery with lens implants   HERNIA REPAIR  03/2005   JOINT REPLACEMENT  2005   total right hip replacement   kidney stent  1987, 02/2005   KIDNEY STONE SURGERY     2 stents placed   KNEE ARTHROSCOPY WITH DRILLING/MICROFRACTURE Left 11/20/2013   Procedure: KNEE ARTHROSCOPY WITH DRILLING/MICROFRACTURE;  Surgeon: Tobi Bastos, MD;  Location: [facility];  Service: Orthopedics;  Laterality: Left;   KNEE ARTHROSCOPY WITH MEDIAL MENISECTOMY Left 11/20/2013   Procedure: LEFT KNEE ARTHROSCOPY WITH MEDIAL MENISECTOMY;  Surgeon: Tobi Bastos, MD;  Location: [facility];  Service: Orthopedics;  Laterality: Left;  Microfracture of medial femoral chondyle and abrasion chondroplasty of the medial femoral chondyle   LITHOTRIPSY     x 2   PICC line placement  2007   quadrec  1999   LFT leg quad sx   right hip surgery  2007   irrigation and debridement from bacteria   TEE WITHOUT CARDIOVERSION N/A 12/02/2012   Procedure: TRANSESOPHAGEAL ECHOCARDIOGRAM (TEE);  Surgeon: Fay Records, MD;  Location: [facility] ENDOSCOPY;  Service: Cardiovascular;  Laterality: N/A;   TOTAL HIP ARTHROPLASTY Left  08/12/2015   Procedure: LEFT TOTAL HIP ARTHROPLASTY;  Surgeon: Latanya Maudlin, MD;  Location: [facility];  Service: Orthopedics;  Laterality: Left;    Social History   Socioeconomic History   Marital status: Married    Spouse name: Inez Catalina   Number of children: 2   Years of education: Not on file   Highest education level: Some college, no degree  Occupational History   Occupation: Contractor: RETIRED  Tobacco Use   Smoking status: Never   Smokeless tobacco: Never  Vaping Use   Vaping Use: Never used  Substance and Sexual Activity   Alcohol use: No   Drug use: No   Sexual activity: Not on file  Other Topics Concern   Not on file  Social History Narrative   Lives in [place] with  spouse.  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   [place] [facility]:   Has worked extensively in [employer]. Significant asbestos asbestos exposure. No bird or mold exposure.       Right handed   Social Determinants of Health   Financial Resource Strain: Not on file  Food Insecurity: Not on file  Transportation Needs: Not on file  Physical Activity: Not on file  Stress: Not on file  Social Connections: Not on file  Intimate Partner Violence: Not on file    Family History  Problem Relation Age of Onset   Cancer Mother 61       CANCER   Heart disease Father 68       HEART PROBLEMS   Lupus Brother        "of the skin"    ROS: Tremor and hip arthralgias but no fevers or chills, productive cough, hemoptysis, dysphasia, odynophagia, melena, hematochezia, dysuria, hematuria, rash, seizure activity, orthopnea, PND, pedal edema, claudication. Remaining systems are negative.  Physical Exam: Well-developed well-nourished in no acute distress.  Skin is warm and dry.  HEENT is normal.  Neck is supple.  Chest is clear to auscultation with normal expansion.  Cardiovascular exam is regular rate and rhythm.  Abdominal exam nontender or  distended. No masses palpated. Extremities show no edema. neuro grossly intact  A/P  1 paroxysmal atrial fibrillation-patient remains in sinus rhythm.  Continue edoxaban and amiodarone.  Check chest x-ray.  Will have most recent laboratories including TSH, liver functions, hemoglobin and renal function forwarded to [place] from primary care.  2 mitral regurgitation-moderate on most recent echocardiogram.  Will repeat echocardiogram September 2024.  3 hypertension-patient's blood pressure is controlled.  Continue present medications.  4 history of chronic stage IIIb kidney disease-check potassium and renal function.  4 lower extremity edema-continue diuretic.  Kirk Ruths, MD

## 2022-09-14 ENCOUNTER — Ambulatory Visit: Payer: Medicare Other | Attending: Cardiology | Admitting: Cardiology

## 2022-09-14 ENCOUNTER — Telehealth: Payer: Self-pay | Admitting: Cardiology

## 2022-09-14 ENCOUNTER — Encounter: Payer: Self-pay | Admitting: Cardiology

## 2022-09-14 VITALS — BP 130/62 | HR 60 | Ht 69.0 in | Wt 177.0 lb

## 2022-09-14 DIAGNOSIS — I340 Nonrheumatic mitral (valve) insufficiency: Secondary | ICD-10-CM | POA: Diagnosis not present

## 2022-09-14 DIAGNOSIS — I4891 Unspecified atrial fibrillation: Secondary | ICD-10-CM

## 2022-09-14 DIAGNOSIS — N1832 Chronic kidney disease, stage 3b: Secondary | ICD-10-CM

## 2022-09-14 DIAGNOSIS — I10 Essential (primary) hypertension: Secondary | ICD-10-CM

## 2022-09-14 NOTE — Patient Instructions (Signed)
  Testing/Procedures: A chest x-ray takes a picture of the organs and structures inside the chest, including the heart, lungs, and blood vessels. This test can show several things, including, whether the heart is enlarges; whether fluid is building up in the lungs; and whether pacemaker / defibrillator leads are still in place. [facility] - [address]   Follow-Up: At [facility], you and your health needs are our priority.  As part of our continuing mission to provide you with exceptional heart care, we have created designated Provider Care Teams.  These Care Teams include your primary Cardiologist (physician) and Advanced Practice Providers (APPs -  Physician Assistants and Nurse Practitioners) who all work together to provide you with the care you need, when you need it.  We recommend signing up for the patient portal called "MyChart".  Sign up information is provided on this After Visit Summary.  MyChart is used to connect with patients for Virtual Visits (Telemedicine).  Patients are able to view lab/test results, encounter notes, upcoming appointments, etc.  Non-urgent messages can be sent to your provider as well.   To learn more about what you can do with MyChart, go to [email or web address].    Your next appointment:   6 month(s)  Provider:   Brian Crenshaw, MD      

## 2022-09-14 NOTE — Telephone Encounter (Signed)
I have it updated, thank you!

## 2022-09-14 NOTE — Telephone Encounter (Signed)
Pt stated he was looking over his paperwork from his appt today. Pt saw that the PCP listed was incorrect and would like to update it with the new PCP. Pt stated he is now seeing Lurline Del at [facility].

## 2022-09-15 ENCOUNTER — Ambulatory Visit
Admission: RE | Admit: 2022-09-15 | Discharge: 2022-09-15 | Disposition: A | Discharge status: Home / Self Care | Payer: Medicare Other | Source: Ambulatory Visit | Attending: Cardiology | Admitting: Cardiology

## 2022-09-15 DIAGNOSIS — I4891 Unspecified atrial fibrillation: Secondary | ICD-10-CM | POA: Diagnosis not present

## 2022-09-15 DIAGNOSIS — M545 Low back pain, unspecified: Secondary | ICD-10-CM | POA: Diagnosis not present

## 2022-09-15 DIAGNOSIS — M19011 Primary osteoarthritis, right shoulder: Secondary | ICD-10-CM | POA: Diagnosis not present

## 2022-09-15 DIAGNOSIS — M19012 Primary osteoarthritis, left shoulder: Secondary | ICD-10-CM | POA: Diagnosis not present

## 2022-09-15 DIAGNOSIS — J929 Pleural plaque without asbestos: Secondary | ICD-10-CM | POA: Diagnosis not present

## 2022-10-05 DIAGNOSIS — Z0000 Encounter for general adult medical examination without abnormal findings: Secondary | ICD-10-CM | POA: Diagnosis not present

## 2022-10-05 DIAGNOSIS — N184 Chronic kidney disease, stage 4 (severe): Secondary | ICD-10-CM | POA: Diagnosis not present

## 2022-10-05 DIAGNOSIS — E039 Hypothyroidism, unspecified: Secondary | ICD-10-CM | POA: Diagnosis not present

## 2022-10-05 DIAGNOSIS — E875 Hyperkalemia: Secondary | ICD-10-CM | POA: Diagnosis not present

## 2022-10-05 DIAGNOSIS — D6869 Other thrombophilia: Secondary | ICD-10-CM | POA: Diagnosis not present

## 2022-10-05 DIAGNOSIS — I480 Paroxysmal atrial fibrillation: Secondary | ICD-10-CM | POA: Diagnosis not present

## 2022-10-06 DIAGNOSIS — E875 Hyperkalemia: Secondary | ICD-10-CM | POA: Diagnosis not present

## 2022-10-09 ENCOUNTER — Telehealth: Payer: Self-pay | Admitting: Cardiology

## 2022-10-09 ENCOUNTER — Telehealth: Payer: Self-pay | Admitting: Nurse Practitioner

## 2022-10-09 ENCOUNTER — Ambulatory Visit: Payer: Medicare Other | Attending: Nurse Practitioner | Admitting: Nurse Practitioner

## 2022-10-09 ENCOUNTER — Encounter: Payer: Self-pay | Admitting: Nurse Practitioner

## 2022-10-09 VITALS — BP 124/64 | HR 96 | Ht 69.0 in | Wt 179.0 lb

## 2022-10-09 DIAGNOSIS — I340 Nonrheumatic mitral (valve) insufficiency: Secondary | ICD-10-CM

## 2022-10-09 DIAGNOSIS — I10 Essential (primary) hypertension: Secondary | ICD-10-CM

## 2022-10-09 DIAGNOSIS — N1832 Chronic kidney disease, stage 3b: Secondary | ICD-10-CM | POA: Diagnosis not present

## 2022-10-09 DIAGNOSIS — I4819 Other persistent atrial fibrillation: Secondary | ICD-10-CM

## 2022-10-09 DIAGNOSIS — G4733 Obstructive sleep apnea (adult) (pediatric): Secondary | ICD-10-CM

## 2022-10-09 DIAGNOSIS — E039 Hypothyroidism, unspecified: Secondary | ICD-10-CM

## 2022-10-09 MED ORDER — AMIODARONE HCL 200 MG PO TABS: ORAL_TABLET | ORAL | 3 refills | Status: DC

## 2022-10-09 MED ORDER — AMIODARONE HCL 200 MG PO TABS: ORAL_TABLET | ORAL | 0 refills | Status: DC

## 2022-10-09 NOTE — Telephone Encounter (Signed)
Spoke with pharmacy @ [facility]. They received the corrected dose change with no refills. When pt finishes the adjusted dose for 2 weeks, pt will resume the 200 mg daily of Amiodarone.

## 2022-10-09 NOTE — Progress Notes (Signed)
Office Visit    Patient Name: Charles [name] Date of Encounter: 10/09/2022  Primary Care Provider:  Jackelyn Poling, DO Primary Cardiologist:  Olga Millers, MD  Chief Complaint    82 year old male with a history of paroxysmal atrial fibrillation, tachycardia mediated cardiomyopathy, mitral valve regurgitation, hypertension, CKD stage IIIb, hypothyroidism, OSA, and GERD who presents for follow-up related to A-fib.  Past Medical History    Past Medical History:  Diagnosis Date   Arthritis    Atrial dilatation    mild biatrial dilitation   Bruises easily    CHF (congestive heart failure) (HCC)    CKD (chronic kidney disease), stage III (HCC)    Coronary artery disease    Dysrhythmia    atrial fibrillation   GERD (gastroesophageal reflux disease)    H/O hiatal hernia    Hypercholesterolemia    Hyperglycemia    Hypertension    Hypothyroidism    Mitral regurgitation    a. Mild by echo in 01/2014.   Nonischemic cardiomyopathy (HCC)    a. presumed to be tachycardia mediated. Varying EFs over the years from 35-40% to normal and back.   Paroxysmal atrial fibrillation (HCC)    a. H/o difficult to control. b. s/p PVI ablation 12-03-2012 by [name] with recurrence afterwards, requiring period of amiodarone.   Peripheral edema    Sleep apnea    USES C PAP   Tear of medial meniscus of left knee    Tremor    Past Surgical History:  Procedure Laterality Date   ATRIAL FIBRILLATION ABLATION N/A 12/03/2012   Procedure: ATRIAL FIBRILLATION ABLATION;  Surgeon: Hillis Range, MD;  Location: [facility] CATH LAB;  Service: Cardiovascular;  Laterality: N/A;   ATRIAL FIBRILLATION ABLATION     PVI by [name]   CARDIAC CATHETERIZATION     CARDIOVERSION N/A 09/04/2012   Procedure: CARDIOVERSION;  Surgeon: [name], MD;  Location: [facility] ENDOSCOPY;  Service: Cardiovascular;  Laterality: N/A;   CARDIOVERSION N/A 02/11/2013   Procedure: CARDIOVERSION;  Surgeon: [name], MD;  Location: [facility]  ENDOSCOPY;  Service: Cardiovascular;  Laterality: N/A;   CARDIOVERSION N/A 12/07/2015   Procedure: CARDIOVERSION;  Surgeon: Chrystie Nose, MD;  Location: [facility] ENDOSCOPY;  Service: Cardiovascular;  Laterality: N/A;   CARDIOVERSION N/A 03/11/2018   Procedure: CARDIOVERSION;  Surgeon: Lars Masson, MD;  Location: [facility] ENDOSCOPY;  Service: Cardiovascular;  Laterality: N/A;   CARDIOVERSION N/A 09/23/2020   Procedure: CARDIOVERSION;  Surgeon: [name], MD;  Location: [facility] ENDOSCOPY;  Service: Cardiovascular;  Laterality: N/A;   CARDIOVERSION N/A 08/19/2021   Procedure: CARDIOVERSION;  Surgeon: Jake Bathe, MD;  Location: [facility] ENDOSCOPY;  Service: Cardiovascular;  Laterality: N/A;   CATARACT EXTRACTION, BILATERAL     EYE SURGERY  2015,2016   bilateral cataract surgery with lens implants   HERNIA REPAIR  03/2005   JOINT REPLACEMENT  2005   total right hip replacement   kidney stent  1987, 02/2005   KIDNEY STONE SURGERY     2 stents placed   KNEE ARTHROSCOPY WITH DRILLING/MICROFRACTURE Left 11/20/2013   Procedure: KNEE ARTHROSCOPY WITH DRILLING/MICROFRACTURE;  Surgeon: Jacki Cones, MD;  Location: [facility];  Service: Orthopedics;  Laterality: Left;   KNEE ARTHROSCOPY WITH MEDIAL MENISECTOMY Left 11/20/2013   Procedure: LEFT KNEE ARTHROSCOPY WITH MEDIAL MENISECTOMY;  Surgeon: Jacki Cones, MD;  Location: [facility];  Service: Orthopedics;  Laterality: Left;  Microfracture of medial femoral chondyle and abrasion chondroplasty of the medial femoral chondyle   LITHOTRIPSY  x 2   PICC line placement  2007   quadrec  1999   LFT leg quad sx   right hip surgery  2007   irrigation and debridement from bacteria   TEE WITHOUT CARDIOVERSION N/A 12/02/2012   Procedure: TRANSESOPHAGEAL ECHOCARDIOGRAM (TEE);  Surgeon: [name], MD;  Location: [facility] ENDOSCOPY;  Service: Cardiovascular;  Laterality: N/A;   TOTAL HIP ARTHROPLASTY Left 08/12/2015   Procedure: LEFT TOTAL HIP ARTHROPLASTY;  Surgeon: Ranee Gosselin, MD;  Location: [facility];  Service: Orthopedics;  Laterality: Left;    Allergies  Allergies  Allergen Reactions   Ceftriaxone Sodium Rash    Rocephin   Clindamycin/Lincomycin Itching and Rash    Hoarse voice   Diltiazem Hcl Rash    Cardizem   Penicillins Rash    Has patient had a PCN reaction causing immediate rash, facial/tongue/throat swelling, SOB or lightheadedness with hypotension: Unknown Has patient had a PCN reaction causing severe rash involving mucus membranes or skin necrosis: Unknown Has patient had a PCN reaction that required hospitalization: No Has patient had a PCN reaction occurring within the last 10 years: Yes If all of the above answers are "NO", then may proceed with Cephalosporin use.      Labs/Other Studies Reviewed    The following studies were reviewed today: Echo 02/2022:  IMPRESSIONS    1. Left ventricular ejection fraction, by estimation, is 55 to 60%. The  left ventricle has normal function. The left ventricle has no regional  wall motion abnormalities. There is mild concentric left ventricular  hypertrophy. Left ventricular diastolic  parameters are consistent with Grade II diastolic dysfunction  (pseudonormalization).   2. Right ventricular systolic function is normal. The right ventricular  size is mildly enlarged. There is severely elevated pulmonary artery  systolic pressure. The estimated right ventricular systolic pressure is  69.8 mmHg.   3. Left atrial size was severely dilated.   4. Right atrial size was mildly dilated.   5. The mitral valve is abnormal. Moderate mitral valve regurgitation. No  evidence of mitral stenosis. Moderate mitral annular calcification.   6. Tricuspid valve regurgitation is moderate.   7. The aortic valve is tricuspid. There is mild calcification of the  aortic valve. Aortic valve regurgitation is not visualized. Aortic valve  sclerosis/calcification is present, without any evidence of aortic   stenosis.   8. The inferior vena cava is dilated in size with >50% respiratory  variability, suggesting right atrial pressure of 8 mmHg.   Comparison(s): 05/31/20 EF 55%. PA pressure .    Recent Labs: 03/09/2022: BUN 42; Creatinine, Ser 2.33; Potassium 4.9; Sodium 142  Recent Lipid Panel    Component Value Date/Time   CHOL 187 03/15/2020 0911   TRIG 208 (H) 03/15/2020 0911   HDL 41 03/15/2020 0911   CHOLHDL 4.6 03/15/2020 0911   VLDL 50 (H) 05/05/2015 1023   LDLCALC 114 (H) 03/15/2020 0911   LDLDIRECT 95.3 10/24/2011 1022    History of Present Illness    82 year old male with the above past medical history including paroxysmal atrial fibrillation, tachycardia mediated cardiomyopathy, mitral valve regurgitation, hypertension, CKD stage IIIb, hypothyroidism, OSA, and GERD.  He is a history of atrial fibrillation s/p multiple DCCV's.  He underwent mapping and ablation of atrial fibrillation in 11/2012 by Dr. [name].  Carotid Dopplers in April 2017 were normal.  He is on amiodarone and edoxaban.  Most recent echocardiogram in 02/2022 showed EF 55 to 60%, normal LV function, no RWMA, mild concentric LVH,  G2 DD, severely elevated PASP (69.8 mmHg), moderate mitral valve regurgitation, moderate TR.  Rpeat echo was recommended in 1 year.  He was last seen in the office on 09/14/2022 and was stable from a cardiac standpoint.  He was maintaining sinus rhythm.  BP was well-controlled.  He contacted our office on 10/09/2022 with concern for irregular heart rhythm, elevated heart rate.  He denied any associated symptoms.  He presents today for follow-up accompanied by his wife.  Since his last visit he has been stable overall from a cardiac standpoint though he remains in rate controlled atrial fibrillation.  He denies any symptoms currently, however, he does have a history of decompensation with persistent atrial fibrillation.  He denies any chest pain, dyspnea, edema, PND, orthopnea, weight  gain.  Home Medications    Current Outpatient Medications  Medication Sig Dispense Refill   acetaminophen (TYLENOL) 650 MG CR tablet Take 650 mg by mouth every 8 (eight) hours as needed for pain.     amiodarone (PACERONE) 200 MG tablet TAKE ONE TABLET BY MOUTH DAILY 90 tablet 3   amiodarone (PACERONE) 200 MG tablet Take 400 mg twice daily for 1 week, then take 200 mg twice daily for 1 week, then resume 200 mg daily. 42 tablet 0   amLODipine (NORVASC) 10 MG tablet Take 1 tablet (10 mg total) by mouth daily. 90 tablet 3   Ascorbic Acid (VITAMIN C) 500 MG tablet Take 1,000 mg by mouth 2 (two) times daily.     Cholecalciferol (VITAMIN D) 125 MCG (5000 UT) CAPS Take 5,000 Units by mouth daily.     Coenzyme Q10 (COQ10) 100 MG CAPS Take 100 mg by mouth every morning.     edoxaban ([name]) 60 MG TABS tablet Take 30 mg by mouth daily. 90 tablet 3   enalapril (VASOTEC) 20 MG tablet TAKE 1/2 TABLET BY MOUTH EVERY NIGHT AT BEDTIME 45 tablet 6   EQ MAGNESIUM CITRATE PO Take 420 mg by mouth daily.     furosemide (LASIX) 40 MG tablet 1 tablet Orally Once a day     IRON PO Take 1 tablet by mouth daily.     levothyroxine (SYNTHROID) 125 MCG tablet TAKE ONE TABLET BY MOUTH DAILY BEFORE BREAKFAST 90 tablet 1   Misc Natural Products (OSTEO BI-FLEX JOINT SHIELD PO) Take 2 tablets by mouth every morning.     Multiple Vitamin (MULTIVITAMIN) tablet Take 1 tablet by mouth daily.     Omega-3 Fatty Acids (FISH OIL) 1000 MG CAPS Take 1,000 mg by mouth 2 (two) times daily.     omeprazole (PRILOSEC) 40 MG capsule TAKE ONE CAPSULE BY MOUTH DAILY 90 capsule 3   polyvinyl alcohol (LIQUIFILM TEARS) 1.4 % ophthalmic solution Place 1 drop into both eyes daily as needed for dry eyes.     zinc gluconate 50 MG tablet Take 50 mg by mouth daily.     No current facility-administered medications for this visit.     Review of Systems    He denies chest pain, palpitations, dyspnea, pnd, orthopnea, n, v, dizziness, syncope,  edema, weight gain, or early satiety. All other systems reviewed and are otherwise negative except as noted above.   Physical Exam    VS:  BP 124/64 (BP Location: Right Arm, Patient Position: Sitting, Cuff Size: Normal)   Pulse 96   Ht  (1.753 m)   Wt 179 lb (81.2 kg)   SpO2 96%   BMI 26.43 kg/m   GEN: Well nourished, well  developed, in no acute distress. HEENT: normal. Neck: Supple, no JVD, carotid bruits, or masses. Cardiac: IRIR, no murmurs, rubs, or gallops. No clubbing, cyanosis, edema.  Radials/DP/PT 2+ and equal bilaterally.  Respiratory:  Respirations regular and unlabored, clear to auscultation bilaterally. GI: Soft, nontender, nondistended, BS + x 4. MS: no deformity or atrophy. Skin: warm and dry, no rash. Neuro:  Strength and sensation are intact. Psych: Normal affect.  Accessory Clinical Findings    ECG personally reviewed by me today -atrial fibrillation, 96 bpm- no acute changes.   Lab Results  Component Value Date   WBC 7.9 08/15/2021   HGB 11.9 (L) 08/19/2021   HCT 35.0 (L) 08/19/2021   MCV 95 08/15/2021   PLT 205 08/15/2021   Lab Results  Component Value Date   CREATININE 2.33 (H) 03/09/2022   BUN 42 (H) 03/09/2022   NA 142 03/09/2022   K 4.9 03/09/2022   CL 103 03/09/2022   CO2 25 03/09/2022   Lab Results  Component Value Date   ALT 17 09/09/2021   AST 21 09/09/2021   ALKPHOS 72 09/09/2021   BILITOT 0.3 09/09/2021   Lab Results  Component Value Date   CHOL 187 03/15/2020   HDL 41 03/15/2020   LDLCALC 114 (H) 03/15/2020   LDLDIRECT 95.3 10/24/2011   TRIG 208 (H) 03/15/2020   CHOLHDL 4.6 03/15/2020    No results found for: "HGBA1C"  Assessment & Plan    1. Persistent atrial fibrillation: EKG today shows rate controlled atrial fibrillation, though he has had some recently elevated heart rates.  He notes he has a history of decompensation with persistent atrial fibrillation and has undergone multiple DCCV's.  He is interested in  pursuing repeat DCCV. Through shared decision making, will proceed with DCCV.  Will check BMET, CBC, TSH, Mg.  Will reload amiodarone (e will take 400 mg twice daily x 1 week followed by 200 mg twice daily x 1 week followed by 200 mg daily). DCCV has been scheduled for 10/23/2022 at 9:30 AM with Dr. Shari Prows. Discussed ED precautions.  Continue amiodarone as above, edoxaban.  If he has early recurrence of atrial fibrillation, consider referral to EP/A-fib clinic.  Shared Decision Making/Informed Consent The risks (stroke, cardiac arrhythmias rarely resulting in the need for a temporary or permanent pacemaker, skin irritation or burns and complications associated with conscious sedation including aspiration, arrhythmia, respiratory failure and death), benefits (restoration of normal sinus rhythm) and alternatives of a direct current cardioversion were explained in detail to Charles [name] and he agrees to proceed.    2. Mitral valve regurgitation:  Most recent echocardiogram in 02/2022 showed EF 55 to 60%, normal LV function, no RWMA, mild concentric LVH, G2 DD, severely elevated PASP (69.8 mmHg), moderate mitral valve regurgitation, moderate TR. Plan for repeat echo in 02/2023.   3. Hypertension: BP well controlled. Continue current antihypertensive regimen.   4. CKD stage IIIb: Creatinine was stable at 2.33 in 02/2022.  Repeat BMET pending.  5. Hypothyroidism: TSH was 1.28 in 08/2021.  Repeat TSH pending as above. Monitored and managed per PCP.   6. OSA: Adherent to CPAP.   7. Disposition: Follow-up 3 weeks post DCCV.      Joylene Grapes, NP 10/09/2022, 5:10 PM

## 2022-10-09 NOTE — Telephone Encounter (Signed)
Patient c/o Palpitations:  High priority if patient c/o lightheadedness, shortness of breath, or chest pain  How long have you had palpitations/irregular HR/ Afib? Are you having the symptoms now? Irregular HR since Saturday, Pulse is racing right now  Are you currently experiencing lightheadedness, SOB or CP? No, feels very normal   Do you have a history of afib (atrial fibrillation) or irregular heart rhythm? Afib  Have you checked your BP or HR? (document readings if available): BP 125/66 HR 90  Are you experiencing any other symptoms? No

## 2022-10-09 NOTE — H&P (View-Only) (Signed)
 Office Visit    Patient Name: Charles [name] Date of Encounter: 10/09/2022  Primary Care Provider:  Welborn, Ryan, DO Primary Cardiologist:  Brian Crenshaw, MD  Chief Complaint    82-year-old male with a history of paroxysmal atrial fibrillation, tachycardia mediated cardiomyopathy, mitral valve regurgitation, hypertension, CKD stage IIIb, hypothyroidism, OSA, and GERD who presents for follow-up related to A-fib.  Past Medical History    Past Medical History:  Diagnosis Date   Arthritis    Atrial dilatation    mild biatrial dilitation   Bruises easily    CHF (congestive heart failure) (HCC)    CKD (chronic kidney disease), stage III (HCC)    Coronary artery disease    Dysrhythmia    atrial fibrillation   GERD (gastroesophageal reflux disease)    H/O hiatal hernia    Hypercholesterolemia    Hyperglycemia    Hypertension    Hypothyroidism    Mitral regurgitation    a. Mild by echo in 01/2014.   Nonischemic cardiomyopathy (HCC)    a. presumed to be tachycardia mediated. Varying EFs over the years from 35-40% to normal and back.   Paroxysmal atrial fibrillation (HCC)    a. H/o difficult to control. b. s/p PVI ablation 12-03-2012 by Dr Allred with recurrence afterwards, requiring period of amiodarone.   Peripheral edema    Sleep apnea    USES C PAP   Tear of medial meniscus of left knee    Tremor    Past Surgical History:  Procedure Laterality Date   ATRIAL FIBRILLATION ABLATION N/A 12/03/2012   Procedure: ATRIAL FIBRILLATION ABLATION;  Surgeon: James Allred, MD;  Location: [facility];  Service: Cardiovascular;  Laterality: N/A;   ATRIAL FIBRILLATION ABLATION     PVI by Dr Allred   CARDIAC CATHETERIZATION     CARDIOVERSION N/A 09/04/2012   Procedure: CARDIOVERSION;  Surgeon: Thomas Brackbill, MD;  Location: [facility];  Service: Cardiovascular;  Laterality: N/A;   CARDIOVERSION N/A 02/11/2013   Procedure: CARDIOVERSION;  Surgeon: Thomas Brackbill, MD;  Location: [facility]  ENDOS[facility];  Service: Cardiovascular;  Laterality: N/A;   CARDIOVERSION N/A 12/07/2015   Procedure: CARDIOVERSION;  Surgeon: Kenneth C Hilty, MD;  Location: [facility];  Service: Cardiovascular;  Laterality: N/A;   CARDIOVERSION N/A 03/11/2018   Procedure: CARDIOVERSION;  Surgeon: Nelson, Katarina H, MD;  Location: [facility];  Service: Cardiovascular;  Laterality: N/A;   CARDIOVERSION N/A 09/23/2020   Procedure: CARDIOVERSION;  Surgeon: Ross, Paula V, MD;  Location: [facility];  Service: Cardiovascular;  Laterality: N/A;   CARDIOVERSION N/A 08/19/2021   Procedure: CARDIOVERSION;  Surgeon: Skains, Mark C, MD;  Location: [facility];  Service: Cardiovascular;  Laterality: N/A;   CATARACT EXTRACTION, BILATERAL     EYE SURGERY  2015,2016   bilateral cataract surgery with lens implants   HERNIA REPAIR  03/2005   JOINT REPLACEMENT  2005   total right hip replacement   kidney stent  1987, 02/2005   KIDNEY STONE SURGERY     2 stents placed   KNEE ARTHROSCOPY WITH DRILLING/MICROFRACTURE Left 11/20/2013   Procedure: KNEE ARTHROSCOPY WITH DRILLING/MICROFRACTURE;  Surgeon: Ronald A Gioffre, MD;  Location: [facility];  Service: Orthopedics;  Laterality: Left;   KNEE ARTHROSCOPY WITH MEDIAL MENISECTOMY Left 11/20/2013   Procedure: LEFT KNEE ARTHROSCOPY WITH MEDIAL MENISECTOMY;  Surgeon: Ronald A Gioffre, MD;  Location: [facility];  Service: Orthopedics;  Laterality: Left;  Microfracture of medial femoral chondyle and abrasion chondroplasty of the medial femoral chondyle   LITHOTRIPSY       x 2   PICC line placement  2007   quadrec  1999   LFT leg quad sx   right hip surgery  2007   irrigation and debridement from bacteria   TEE WITHOUT CARDIOVERSION N/A 12/02/2012   Procedure: TRANSESOPHAGEAL ECHOCARDIOGRAM (TEE);  Surgeon: Paula V Ross, MD;  Location: [facility];  Service: Cardiovascular;  Laterality: N/A;   TOTAL HIP ARTHROPLASTY Left 08/12/2015   Procedure: LEFT TOTAL HIP ARTHROPLASTY;  Surgeon: Ronald  Gioffre, MD;  Location: [facility];  Service: Orthopedics;  Laterality: Left;    Allergies  Allergies  Allergen Reactions   Ceftriaxone Sodium Rash    Rocephin   Clindamycin/Lincomycin Itching and Rash    Hoarse voice   Diltiazem Hcl Rash    Cardizem   Penicillins Rash    Has patient had a PCN reaction causing immediate rash, facial/tongue/throat swelling, SOB or lightheadedness with hypotension: Unknown Has patient had a PCN reaction causing severe rash involving mucus membranes or skin necrosis: Unknown Has patient had a PCN reaction that required hospitalization: No Has patient had a PCN reaction occurring within the last 10 years: Yes If all of the above answers are "NO", then may proceed with Cephalosporin use.      Labs/Other Studies Reviewed    The following studies were reviewed today: Echo 02/2022:  IMPRESSIONS    1. Left ventricular ejection fraction, by estimation, is 55 to 60%. The  left ventricle has normal function. The left ventricle has no regional  wall motion abnormalities. There is mild concentric left ventricular  hypertrophy. Left ventricular diastolic  parameters are consistent with Grade II diastolic dysfunction  (pseudonormalization).   2. Right ventricular systolic function is normal. The right ventricular  size is mildly enlarged. There is severely elevated pulmonary artery  systolic pressure. The estimated right ventricular systolic pressure is  69.8 mmHg.   3. Left atrial size was severely dilated.   4. Right atrial size was mildly dilated.   5. The mitral valve is abnormal. Moderate mitral valve regurgitation. No  evidence of mitral stenosis. Moderate mitral annular calcification.   6. Tricuspid valve regurgitation is moderate.   7. The aortic valve is tricuspid. There is mild calcification of the  aortic valve. Aortic valve regurgitation is not visualized. Aortic valve  sclerosis/calcification is present, without any evidence of aortic   stenosis.   8. The inferior vena cava is dilated in size with >50% respiratory  variability, suggesting right atrial pressure of 8 mmHg.   Comparison(s): 05/31/20 EF 55%. PA pressure 39mmHg.    Recent Labs: 03/09/2022: BUN 42; Creatinine, Ser 2.33; Potassium 4.9; Sodium 142  Recent Lipid Panel    Component Value Date/Time   CHOL 187 03/15/2020 0911   TRIG 208 (H) 03/15/2020 0911   HDL 41 03/15/2020 0911   CHOLHDL 4.6 03/15/2020 0911   VLDL 50 (H) 05/05/2015 1023   LDLCALC 114 (H) 03/15/2020 0911   LDLDIRECT 95.3 10/24/2011 1022    History of Present Illness    82-year-old male with the above past medical history including paroxysmal atrial fibrillation, tachycardia mediated cardiomyopathy, mitral valve regurgitation, hypertension, CKD stage IIIb, hypothyroidism, OSA, and GERD.  He is a history of atrial fibrillation s/p multiple DCCV's.  He underwent mapping and ablation of atrial fibrillation in 11/2012 by Dr. Allred.  Carotid Dopplers in April 2017 were normal.  He is on amiodarone and edoxaban.  Most recent echocardiogram in 02/2022 showed EF 55 to 60%, normal LV function, no RWMA, mild concentric LVH,   G2 DD, severely elevated PASP (69.8 mmHg), moderate mitral valve regurgitation, moderate TR.  Rpeat echo was recommended in 1 year.  He was last seen in the office on 09/14/2022 and was stable from a cardiac standpoint.  He was maintaining sinus rhythm.  BP was well-controlled.  He contacted our office on 10/09/2022 with concern for irregular heart rhythm, elevated heart rate.  He denied any associated symptoms.  He presents today for follow-up accompanied by his wife.  Since his last visit he has been stable overall from a cardiac standpoint though he remains in rate controlled atrial fibrillation.  He denies any symptoms currently, however, he does have a history of decompensation with persistent atrial fibrillation.  He denies any chest pain, dyspnea, edema, PND, orthopnea, weight  gain.  Home Medications    Current Outpatient Medications  Medication Sig Dispense Refill   acetaminophen (TYLENOL) 650 MG CR tablet Take 650 mg by mouth every 8 (eight) hours as needed for pain.     amiodarone (PACERONE) 200 MG tablet TAKE ONE TABLET BY MOUTH DAILY 90 tablet 3   amiodarone (PACERONE) 200 MG tablet Take 400 mg twice daily for 1 week, then take 200 mg twice daily for 1 week, then resume 200 mg daily. 42 tablet 0   amLODipine (NORVASC) 10 MG tablet Take 1 tablet (10 mg total) by mouth daily. 90 tablet 3   Ascorbic Acid (VITAMIN C) 500 MG tablet Take 1,000 mg by mouth 2 (two) times daily.     Cholecalciferol (VITAMIN D) 125 MCG (5000 UT) CAPS Take 5,000 Units by mouth daily.     Coenzyme Q10 (COQ10) 100 MG CAPS Take 100 mg by mouth every morning.     edoxaban ([name]) 60 MG TABS tablet Take 30 mg by mouth daily. 90 tablet 3   enalapril (VASOTEC) 20 MG tablet TAKE 1/2 TABLET BY MOUTH EVERY NIGHT AT BEDTIME 45 tablet 6   EQ MAGNESIUM CITRATE PO Take 420 mg by mouth daily.     furosemide (LASIX) 40 MG tablet 1 tablet Orally Once a day     IRON PO Take 1 tablet by mouth daily.     levothyroxine (SYNTHROID) 125 MCG tablet TAKE ONE TABLET BY MOUTH DAILY BEFORE BREAKFAST 90 tablet 1   Misc Natural Products (OSTEO BI-FLEX JOINT SHIELD PO) Take 2 tablets by mouth every morning.     Multiple Vitamin (MULTIVITAMIN) tablet Take 1 tablet by mouth daily.     Omega-3 Fatty Acids (FISH OIL) 1000 MG CAPS Take 1,000 mg by mouth 2 (two) times daily.     omeprazole (PRILOSEC) 40 MG capsule TAKE ONE CAPSULE BY MOUTH DAILY 90 capsule 3   polyvinyl alcohol (LIQUIFILM TEARS) 1.4 % ophthalmic solution Place 1 drop into both eyes daily as needed for dry eyes.     zinc gluconate 50 MG tablet Take 50 mg by mouth daily.     No current facility-administered medications for this visit.     Review of Systems    He denies chest pain, palpitations, dyspnea, pnd, orthopnea, n, v, dizziness, syncope,  edema, weight gain, or early satiety. All other systems reviewed and are otherwise negative except as noted above.   Physical Exam    VS:  BP 124/64 (BP Location: Right Arm, Patient Position: Sitting, Cuff Size: Normal)   Pulse 96   Ht 5' 9" (1.753 m)   Wt 179 lb (81.2 kg)   SpO2 96%   BMI 26.43 kg/m   GEN: Well nourished, well   developed, in no acute distress. HEENT: normal. Neck: Supple, no JVD, carotid bruits, or masses. Cardiac: IRIR, no murmurs, rubs, or gallops. No clubbing, cyanosis, edema.  Radials/DP/PT 2+ and equal bilaterally.  Respiratory:  Respirations regular and unlabored, clear to auscultation bilaterally. GI: Soft, nontender, nondistended, BS + x 4. MS: no deformity or atrophy. Skin: warm and dry, no rash. Neuro:  Strength and sensation are intact. Psych: Normal affect.  Accessory Clinical Findings    ECG personally reviewed by me today -atrial fibrillation, 96 bpm- no acute changes.   Lab Results  Component Value Date   WBC 7.9 08/15/2021   HGB 11.9 (L) 08/19/2021   HCT 35.0 (L) 08/19/2021   MCV 95 08/15/2021   PLT 205 08/15/2021   Lab Results  Component Value Date   CREATININE 2.33 (H) 03/09/2022   BUN 42 (H) 03/09/2022   NA 142 03/09/2022   K 4.9 03/09/2022   CL 103 03/09/2022   CO2 25 03/09/2022   Lab Results  Component Value Date   ALT 17 09/09/2021   AST 21 09/09/2021   ALKPHOS 72 09/09/2021   BILITOT 0.3 09/09/2021   Lab Results  Component Value Date   CHOL 187 03/15/2020   HDL 41 03/15/2020   LDLCALC 114 (H) 03/15/2020   LDLDIRECT 95.3 10/24/2011   TRIG 208 (H) 03/15/2020   CHOLHDL 4.6 03/15/2020    No results found for: "HGBA1C"  Assessment & Plan    1. Persistent atrial fibrillation: EKG today shows rate controlled atrial fibrillation, though he has had some recently elevated heart rates.  He notes he has a history of decompensation with persistent atrial fibrillation and has undergone multiple DCCV's.  He is interested in  pursuing repeat DCCV. Through shared decision making, will proceed with DCCV.  Will check BMET, CBC, TSH, Mg.  Will reload amiodarone (e will take 400 mg twice daily x 1 week followed by 200 mg twice daily x 1 week followed by 200 mg daily). DCCV has been scheduled for 10/23/2022 at 9:30 AM with Dr. Pemberton. Discussed ED precautions.  Continue amiodarone as above, edoxaban.  If he has early recurrence of atrial fibrillation, consider referral to EP/A-fib clinic.  Shared Decision Making/Informed Consent The risks (stroke, cardiac arrhythmias rarely resulting in the need for a temporary or permanent pacemaker, skin irritation or burns and complications associated with conscious sedation including aspiration, arrhythmia, respiratory failure and death), benefits (restoration of normal sinus rhythm) and alternatives of a direct current cardioversion were explained in detail to Mr. Hottenstein and he agrees to proceed.    2. Mitral valve regurgitation:  Most recent echocardiogram in 02/2022 showed EF 55 to 60%, normal LV function, no RWMA, mild concentric LVH, G2 DD, severely elevated PASP (69.8 mmHg), moderate mitral valve regurgitation, moderate TR. Plan for repeat echo in 02/2023.   3. Hypertension: BP well controlled. Continue current antihypertensive regimen.   4. CKD stage IIIb: Creatinine was stable at 2.33 in 02/2022.  Repeat BMET pending.  5. Hypothyroidism: TSH was 1.28 in 08/2021.  Repeat TSH pending as above. Monitored and managed per PCP.   6. OSA: Adherent to CPAP.   7. Disposition: Follow-up 3 weeks post DCCV.      Arieana Somoza C Abbegale Stehle, NP 10/09/2022, 5:10 PM   

## 2022-10-09 NOTE — Telephone Encounter (Signed)
Spoke with pt and since Saturday HR has been up and down Pt does not always know when is in afib  Pt does get symptomatic after a few days and does start to swell Pt has no symptoms at this time but does note elevated Heart Rate ranging from 83-103 No changes in medicines Pt did however have  labs done last week at PCP and had abnormal value and is not sure what value is going to call and find out Will forward to Dr Jens Som for review and recommendations ./cy

## 2022-10-09 NOTE — Telephone Encounter (Signed)
Pt c/o medication issue:  1. Name of Medication: amiodarone (PACERONE) 200 MG tablet   2. How are you currently taking this medication (dosage and times per day)?   3. Are you having a reaction (difficulty breathing--STAT)?   4. What is your medication issue? Charles [name] would like clarification on medication directions.

## 2022-10-09 NOTE — Telephone Encounter (Signed)
Pt aware of recommendations . Pt has appt today at 1:55 pm with Bernadene Person NP .Zack Seal

## 2022-10-09 NOTE — Patient Instructions (Signed)
Medication Instructions:  Amiodarone 400 mg twice daily for 1 week, then take 200 mg twice daily for 1 week, then 200 mg daily as directed.  *If you need a refill on your cardiac medications before your next appointment, please call your pharmacy*   Lab Work: Your physician recommends that you return for lab work 1 week prior to procedure. CBC, BMET, TSH, Magnesium  If you have labs (blood work) drawn today and your tests are completely normal, you will receive your results only by: MyChart Message (if you have MyChart) OR A paper copy in the mail If you have any lab test that is abnormal or we need to change your treatment, we will call you to review the results.   Testing/Procedures: Your physician has recommended that you have a Cardioversion (DCCV). Electrical Cardioversion uses a jolt of electricity to your heart either through paddles or wired patches attached to your chest. This is a controlled, usually prescheduled, procedure. Defibrillation is done under light anesthesia in the hospital, and you usually go home the day of the procedure. This is done to get your heart back into a normal rhythm. You are not awake for the procedure. Please see the instruction sheet given to you today.    Follow-Up: At [facility], you and your health needs are our priority.  As part of our continuing mission to provide you with exceptional heart care, we have created designated Provider Care Teams.  These Care Teams include your primary Cardiologist (physician) and Advanced Practice Providers (APPs -  Physician Assistants and Nurse Practitioners) who all work together to provide you with the care you need, when you need it.  We recommend signing up for the patient portal called "MyChart".  Sign up information is provided on this After Visit Summary.  MyChart is used to connect with patients for Virtual Visits (Telemedicine).  Patients are able to view lab/test results, encounter notes, upcoming  appointments, etc.  Non-urgent messages can be sent to your provider as well.   To learn more about what you can do with MyChart, go to [email or web address].    Your next appointment:   5 week(s)  Provider:   Bernadene Person, NP        Other Instructions     Dear Charles [name]  You are scheduled for a Cardioversion on Monday, April 29 with Dr. Shari Prows.  Please arrive at the [facility] (Main Entrance A) at [facility]: [address] [place], [place] [address] at 8:30 AM.   DIET:  Nothing to eat or drink after midnight except a sip of water with medications (see medication instructions below)  MEDICATION INSTRUCTIONS: !!IF ANY NEW MEDICATIONS ARE STARTED AFTER TODAY, PLEASE NOTIFY YOUR PROVIDER AS SOON AS POSSIBLE!!  FYI: Medications such as Semaglutide (Ozempic, [place]), Tirzepatide (Mounjaro, Zepbound), Dulaglutide (Trulicity), etc ("GLP1 agonists") must be held around the time of a procedure. Talk to your provider if you take one of these.   Continue taking your anticoagulant (blood thinner): Edoxaban ([name]).  You will need to continue this after your procedure until you are told by your provider that it is safe to stop.    LABS:   Complete lab work 1 week prior to procedure.   FYI:  For your safety, and to allow [place] to monitor your vital signs accurately during the surgery/procedure we request: If you have artificial nails, gel coating, SNS etc, please have those removed prior to your surgery/procedure. Not having the nail coverings [name]  removed may result in cancellation or delay of your surgery/procedure.  You must have a responsible [name] to drive you home and stay in the waiting area during your procedure. Failure to do so could result in cancellation.  Bring your insurance cards.  *Special Note: Every effort is made to have your procedure done on time. Occasionally there are emergencies that occur at the hospital that may cause delays. Please be patient  if a delay does occur.

## 2022-10-09 NOTE — Telephone Encounter (Signed)
Spoke with pharmacy tech to clarify amiodarone prescription. Instructions are unclear and prescription was sent in for 90 days with 3 refills however prescription states pt will be on medication for 3 weeks total. Pt also has another prescription for amiodarone 200mg  once daily. Pharmacy tech advises that she will cancel prescription and asked that we re-send with more attention to instructions. Will route to provider to advise.

## 2022-10-16 NOTE — Addendum Note (Signed)
Addended by: Shon Hale on: 10/16/2022 08:29 AM   Modules accepted: Orders

## 2022-10-19 ENCOUNTER — Encounter: Payer: Self-pay | Admitting: *Deleted

## 2022-10-19 DIAGNOSIS — G4733 Obstructive sleep apnea (adult) (pediatric): Secondary | ICD-10-CM | POA: Diagnosis not present

## 2022-10-19 DIAGNOSIS — E039 Hypothyroidism, unspecified: Secondary | ICD-10-CM | POA: Diagnosis not present

## 2022-10-19 DIAGNOSIS — I340 Nonrheumatic mitral (valve) insufficiency: Secondary | ICD-10-CM | POA: Diagnosis not present

## 2022-10-19 DIAGNOSIS — N1832 Chronic kidney disease, stage 3b: Secondary | ICD-10-CM | POA: Diagnosis not present

## 2022-10-19 DIAGNOSIS — I4819 Other persistent atrial fibrillation: Secondary | ICD-10-CM | POA: Diagnosis not present

## 2022-10-20 LAB — BASIC METABOLIC PANEL
BUN/Creatinine Ratio: 22 (ref 10–24)
BUN: 46 mg/dL — ABNORMAL HIGH (ref 8–27)
CO2: 19 mmol/L — ABNORMAL LOW (ref 20–29)
Calcium: 9.7 mg/dL (ref 8.6–10.2)
Chloride: 103 mmol/L (ref 96–106)
Creatinine, Ser: 2.12 mg/dL — ABNORMAL HIGH (ref 0.76–1.27)
Glucose: 145 mg/dL — ABNORMAL HIGH (ref 70–99)
Potassium: 4.8 mmol/L (ref 3.5–5.2)
Sodium: 139 mmol/L (ref 134–144)
eGFR: 30 mL/min/{1.73_m2} — ABNORMAL LOW (ref >59)

## 2022-10-20 LAB — CBC
Hematocrit: 31.8 % — ABNORMAL LOW (ref 37.5–51.0)
Hemoglobin: 10.4 g/dL — ABNORMAL LOW (ref 13.0–17.7)
MCH: 31.8 pg (ref 26.6–33.0)
MCHC: 32.7 g/dL (ref 31.5–35.7)
MCV: 97 fL (ref 79–97)
Platelets: 252 10*3/uL (ref 150–450)
RBC: 3.27 x10E6/uL — ABNORMAL LOW (ref 4.14–5.80)
RDW: 11.8 % (ref 11.6–15.4)
WBC: 9.7 10*3/uL (ref 3.4–10.8)

## 2022-10-20 LAB — MAGNESIUM: Magnesium: 2.2 mg/dL (ref 1.6–2.3)

## 2022-10-20 LAB — TSH: TSH: 1.89 u[IU]/mL (ref 0.450–4.500)

## 2022-10-20 NOTE — Pre-Procedure Instructions (Signed)
Spoke with patient regarding cardioversion instructions - arrive at 0830, NPO after midnight on Sunday, ensure ride home and responsible person to stay with pt for 24 hours after procedure, continue taking [name]. Confirmed pt hasn't missed any doses of savaysa

## 2022-10-22 NOTE — Anesthesia Preprocedure Evaluation (Signed)
Anesthesia Evaluation  Patient identified by MRN, date of birth, ID band Patient awake    Reviewed: Allergy & Precautions, NPO status , Patient's Chart, lab work & pertinent test results  Airway Mallampati: II  TM Distance: >3 FB Neck ROM: Full    Dental no notable dental hx. (+) Teeth Intact, Dental Advisory Given   Pulmonary sleep apnea and Continuous Positive Airway Pressure Ventilation    Pulmonary exam normal breath sounds clear to auscultation       Cardiovascular hypertension, + CAD, +CHF and + Orthopnea  Normal cardiovascular exam+ dysrhythmias (edoxaban, no missed doses) Atrial Fibrillation + Valvular Problems/Murmurs (mod MR) MR  Rhythm:Irregular Rate:Normal  TTE 2023  1. Left ventricular ejection fraction, by estimation, is 55 to 60%. The  left ventricle has normal function. The left ventricle has no regional  wall motion abnormalities. There is mild concentric left ventricular  hypertrophy. Left ventricular diastolic  parameters are consistent with Grade II diastolic dysfunction  (pseudonormalization).   2. Right ventricular systolic function is normal. The right ventricular  size is mildly enlarged. There is severely elevated pulmonary artery  systolic pressure. The estimated right ventricular systolic pressure is  69.8 mmHg.   3. Left atrial size was severely dilated.   4. Right atrial size was mildly dilated.   5. The mitral valve is abnormal. Moderate mitral valve regurgitation. No  evidence of mitral stenosis. Moderate mitral annular calcification.   6. Tricuspid valve regurgitation is moderate.   7. The aortic valve is tricuspid. There is mild calcification of the  aortic valve. Aortic valve regurgitation is not visualized. Aortic valve  sclerosis/calcification is present, without any evidence of aortic  stenosis.   8. The inferior vena cava is dilated in size with >50% respiratory  variability, suggesting  right atrial pressure of 8 mmHg.     Neuro/Psych negative neurological ROS  negative psych ROS   GI/Hepatic Neg liver ROS, hiatal hernia,GERD  ,,  Endo/Other  Hypothyroidism    Renal/GU Renal InsufficiencyRenal diseaseLab Results      Component                Value               Date                      CREATININE               2.12 (H)            10/19/2022                BUN                      46 (H)              10/19/2022                NA                       139                 10/19/2022                K                        4.8                 10/19/2022  CL                       103                 10/19/2022                CO2                      19 (L)              10/19/2022             negative genitourinary   Musculoskeletal  (+) Arthritis ,    Abdominal   Peds  Hematology negative hematology ROS (+)   Anesthesia Other Findings   Reproductive/Obstetrics                             Anesthesia Physical Anesthesia Plan  ASA: 3  Anesthesia Plan: General   Post-op Pain Management: Minimal or no pain anticipated   Induction: Intravenous  PONV Risk Score and Plan: Propofol infusion and Treatment may vary due to age or medical condition  Airway Management Planned: Natural Airway  Additional Equipment:   Intra-op Plan:   Post-operative Plan:   Informed Consent: I have reviewed the patients History and Physical, chart, labs and discussed the procedure including the risks, benefits and alternatives for the proposed anesthesia with the patient or authorized representative who has indicated his/her understanding and acceptance.     Dental advisory given  Plan Discussed with: CRNA  Anesthesia Plan Comments:        Anesthesia Quick Evaluation

## 2022-10-23 ENCOUNTER — Ambulatory Visit (HOSPITAL_COMMUNITY): Payer: Medicare Other | Admitting: Anesthesiology

## 2022-10-23 ENCOUNTER — Encounter (HOSPITAL_COMMUNITY): Payer: Self-pay | Admitting: Cardiology

## 2022-10-23 ENCOUNTER — Other Ambulatory Visit: Payer: Self-pay

## 2022-10-23 ENCOUNTER — Encounter (HOSPITAL_COMMUNITY)
Admission: RE | Disposition: A | Discharge status: Home / Self Care | Payer: Self-pay | Source: Home / Self Care | Attending: Cardiology

## 2022-10-23 ENCOUNTER — Ambulatory Visit (HOSPITAL_BASED_OUTPATIENT_CLINIC_OR_DEPARTMENT_OTHER): Payer: Medicare Other | Admitting: Anesthesiology

## 2022-10-23 ENCOUNTER — Ambulatory Visit (HOSPITAL_COMMUNITY)
Admission: RE | Admit: 2022-10-23 | Discharge: 2022-10-23 | Disposition: A | Discharge status: Home / Self Care | Payer: Medicare Other | Attending: Cardiology | Admitting: Cardiology

## 2022-10-23 DIAGNOSIS — Z79899 Other long term (current) drug therapy: Secondary | ICD-10-CM | POA: Insufficient documentation

## 2022-10-23 DIAGNOSIS — N1832 Chronic kidney disease, stage 3b: Secondary | ICD-10-CM | POA: Insufficient documentation

## 2022-10-23 DIAGNOSIS — K219 Gastro-esophageal reflux disease without esophagitis: Secondary | ICD-10-CM | POA: Diagnosis not present

## 2022-10-23 DIAGNOSIS — I110 Hypertensive heart disease with heart failure: Secondary | ICD-10-CM | POA: Diagnosis not present

## 2022-10-23 DIAGNOSIS — E039 Hypothyroidism, unspecified: Secondary | ICD-10-CM | POA: Diagnosis not present

## 2022-10-23 DIAGNOSIS — I4819 Other persistent atrial fibrillation: Secondary | ICD-10-CM

## 2022-10-23 DIAGNOSIS — I509 Heart failure, unspecified: Secondary | ICD-10-CM

## 2022-10-23 DIAGNOSIS — Z7901 Long term (current) use of anticoagulants: Secondary | ICD-10-CM | POA: Insufficient documentation

## 2022-10-23 DIAGNOSIS — I130 Hypertensive heart and chronic kidney disease with heart failure and stage 1 through stage 4 chronic kidney disease, or unspecified chronic kidney disease: Secondary | ICD-10-CM | POA: Diagnosis not present

## 2022-10-23 DIAGNOSIS — G4733 Obstructive sleep apnea (adult) (pediatric): Secondary | ICD-10-CM

## 2022-10-23 DIAGNOSIS — I2510 Atherosclerotic heart disease of native coronary artery without angina pectoris: Secondary | ICD-10-CM

## 2022-10-23 DIAGNOSIS — I429 Cardiomyopathy, unspecified: Secondary | ICD-10-CM | POA: Insufficient documentation

## 2022-10-23 DIAGNOSIS — I081 Rheumatic disorders of both mitral and tricuspid valves: Secondary | ICD-10-CM | POA: Diagnosis not present

## 2022-10-23 DIAGNOSIS — Z9989 Dependence on other enabling machines and devices: Secondary | ICD-10-CM

## 2022-10-23 DIAGNOSIS — I4891 Unspecified atrial fibrillation: Secondary | ICD-10-CM | POA: Diagnosis not present

## 2022-10-23 HISTORY — PX: CARDIOVERSION: SHX1299

## 2022-10-23 SURGERY — CARDIOVERSION
Anesthesia: General

## 2022-10-23 MED ORDER — PROPOFOL 10 MG/ML IV BOLUS
INTRAVENOUS | Status: DC | PRN
  Administered 2022-10-23 (×2): 50 mg via INTRAVENOUS

## 2022-10-23 MED ORDER — LIDOCAINE 2% (20 MG/ML) 5 ML SYRINGE
INTRAMUSCULAR | Status: DC | PRN
  Administered 2022-10-23: 60 mg via INTRAVENOUS

## 2022-10-23 MED ORDER — SODIUM CHLORIDE 0.9 % IV SOLN: INTRAVENOUS | Status: DC

## 2022-10-23 SURGICAL SUPPLY — 1 items: ELECT DEFIB PAD ADLT CADENCE (PAD) ×1 IMPLANT

## 2022-10-23 NOTE — Anesthesia Procedure Notes (Signed)
Procedure Name: MAC Date/Time: 10/23/2022 9:43 AM  Performed by: Jodell Cipro, CRNAPre-anesthesia Checklist: Patient identified, Emergency Drugs available, Suction available, Patient being monitored and Timeout performed Patient Re-evaluated:Patient Re-evaluated prior to induction Oxygen Delivery Method: Nasal cannula Placement Confirmation: positive ETCO2 Dental Injury: Teeth and Oropharynx as per pre-operative assessment

## 2022-10-23 NOTE — Anesthesia Postprocedure Evaluation (Signed)
Anesthesia Post Note  Patient: YORK VALLIANT  Procedure(s) Performed: CARDIOVERSION     Patient location during evaluation: PACU Anesthesia Type: General Level of consciousness: awake and alert Pain management: pain level controlled Vital Signs Assessment: post-procedure vital signs reviewed and stable Respiratory status: spontaneous breathing, nonlabored ventilation, respiratory function stable and patient connected to nasal cannula oxygen Cardiovascular status: blood pressure returned to baseline and stable Postop Assessment: no apparent nausea or vomiting Anesthetic complications: no  No notable events documented.  Last Vitals:  Vitals:   10/23/22 1000 10/23/22 1010  BP: (!) 108/54 (!) 110/55  Pulse: (!) 59 (!) 58  Resp: 19 18  Temp:    SpO2: 98% 96%    Last Pain:  Vitals:   10/23/22 1010  TempSrc:   PainSc: 0-No pain                 Myleka Moncure L Leatrice Parilla

## 2022-10-23 NOTE — Discharge Instructions (Signed)

## 2022-10-23 NOTE — CV Procedure (Signed)
Procedure: Electrical Cardioversion Indications:  Atrial Fibrillation  Procedure Details:  Consent: Risks of procedure as well as the alternatives and risks of each were explained to the (patient/caregiver).  Consent for procedure obtained.  Time Out: Verified patient identification, verified procedure, site/side was marked, verified correct patient position, special equipment/implants available, medications/allergies/relevent history reviewed, required imaging and test results available. PERFORMED.  Patient placed on cardiac monitor, pulse oximetry, supplemental oxygen as necessary.  Sedation given:  Propofol 100mg ; lidocaine 60mg  Pacer pads placed anterior and posterior chest.  Cardioverted 2 time(s).  Cardioversion with synchronized biphasic 150J, 200J shock.  Evaluation: Findings: Post procedure EKG shows:  Sinus bradycardia Complications: None Patient did tolerate procedure well.  Time Spent Directly with the Patient:    Meriam Sprague 10/23/2022, 9:48 AM

## 2022-10-23 NOTE — Transfer of Care (Signed)
Immediate Anesthesia Transfer of Care Note  Patient: Charles [name]  Procedure(s) Performed: CARDIOVERSION  Patient Location: Cath Lab  Anesthesia Type:General  Level of Consciousness: drowsy  Airway & Oxygen Therapy: Patient Spontanous Breathing and Patient connected to nasal cannula oxygen  Post-op Assessment: Report given to RN and Post -op Vital signs reviewed and stable  Post vital signs: Reviewed and stable  Last Vitals:  Vitals Value Taken Time  BP    Temp    Pulse 96 10/23/22 0933  Resp 4 10/23/22 0933  SpO2 99 % 10/23/22 0933  Vitals shown include unvalidated device data.  Last Pain:  Vitals:   10/23/22 0908  TempSrc:   PainSc: 0-No pain         Complications: No notable events documented.

## 2022-10-23 NOTE — Interval H&P Note (Signed)
History and Physical Interval Note:  10/23/2022 9:22 AM  Charles [name]  has presented today for surgery, with the diagnosis of persistent a fib.  The various methods of treatment have been discussed with the patient and family. After consideration of risks, benefits and other options for treatment, the patient has consented to  Procedure(s): CARDIOVERSION (N/A) as a surgical intervention.  The patient's history has been reviewed, patient examined, no change in status, stable for surgery.  I have reviewed the patient's chart and labs.  Questions were answered to the patient's satisfaction.     Meriam Sprague

## 2022-10-24 ENCOUNTER — Encounter: Payer: Self-pay | Admitting: Cardiology

## 2022-10-26 ENCOUNTER — Encounter: Payer: Self-pay | Admitting: Cardiology

## 2022-10-26 NOTE — Telephone Encounter (Signed)
Spoke with pt, since the DCCV he has not regained any stamina. He feels he is still in rhythm with a pulse of 72 bpm. He feels tired and has some body aches. He reports he was out of rhythm 2 weeks prior to the DCCV and usually gets fatigue when out of rhythm but this time he feels that way after the procedure. His blood pressure is good at 133/55. He thinks he maybe still taking amiodarone 200 mg twice daily but his wife is not at home so he is not sure. Advised patient if he is taking amiodarone BID to reduce the dose to once daily. He is aware dr Jens Som is not in the office today but will get his recommendations.

## 2022-10-27 ENCOUNTER — Ambulatory Visit: Payer: Medicare Other | Attending: Nurse Practitioner | Admitting: Nurse Practitioner

## 2022-10-27 ENCOUNTER — Encounter: Payer: Self-pay | Admitting: Nurse Practitioner

## 2022-10-27 ENCOUNTER — Other Ambulatory Visit: Payer: Self-pay

## 2022-10-27 VITALS — BP 126/50 | HR 68 | Ht 69.0 in | Wt 181.0 lb

## 2022-10-27 DIAGNOSIS — E039 Hypothyroidism, unspecified: Secondary | ICD-10-CM

## 2022-10-27 DIAGNOSIS — R0609 Other forms of dyspnea: Secondary | ICD-10-CM | POA: Diagnosis not present

## 2022-10-27 DIAGNOSIS — I4819 Other persistent atrial fibrillation: Secondary | ICD-10-CM

## 2022-10-27 DIAGNOSIS — I10 Essential (primary) hypertension: Secondary | ICD-10-CM

## 2022-10-27 DIAGNOSIS — N1832 Chronic kidney disease, stage 3b: Secondary | ICD-10-CM

## 2022-10-27 DIAGNOSIS — R600 Localized edema: Secondary | ICD-10-CM | POA: Diagnosis not present

## 2022-10-27 DIAGNOSIS — I340 Nonrheumatic mitral (valve) insufficiency: Secondary | ICD-10-CM

## 2022-10-27 DIAGNOSIS — G4733 Obstructive sleep apnea (adult) (pediatric): Secondary | ICD-10-CM

## 2022-10-27 DIAGNOSIS — R531 Weakness: Secondary | ICD-10-CM | POA: Diagnosis not present

## 2022-10-27 DIAGNOSIS — D649 Anemia, unspecified: Secondary | ICD-10-CM

## 2022-10-27 NOTE — Patient Instructions (Addendum)
Medication Instructions:  Lasix (Furosemide) 80 mg daily for 3 days, then resume 40 mg daily.  *If you need a refill on your cardiac medications before your next appointment, please call your pharmacy*   Lab Work: Complete lab work today and return in 1 week for lab work.  CBC, BNP, CMET, TSH, Amiodarone level (today). BMET (1 week)  If you have labs (blood work) drawn today and your tests are completely normal, you will receive your results only by: MyChart Message (if you have MyChart) OR A paper copy in the mail If you have any lab test that is abnormal or we need to change your treatment, we will call you to review the results.   Testing/Procedures: NONE ordered at this time of appointment    Follow-Up: At [facility], you and your health needs are our priority.  As part of our continuing mission to provide you with exceptional heart care, we have created designated Provider Care Teams.  These Care Teams include your primary Cardiologist (physician) and Advanced Practice Providers (APPs -  Physician Assistants and Nurse Practitioners) who all work together to provide you with the care you need, when you need it.  We recommend signing up for the patient portal called "MyChart".  Sign up information is provided on this After Visit Summary.  MyChart is used to connect with patients for Virtual Visits (Telemedicine).  Patients are able to view lab/test results, encounter notes, upcoming appointments, etc.  Non-urgent messages can be sent to your provider as well.   To learn more about what you can do with MyChart, go to [email or web address].    Your next appointment:   Keep follow up   See Dr. Tresa Endo on Sleep Day New Patient  Provider:   Bernadene Person, NP        Other Instructions Referral sent to EP and Sleep Clinic

## 2022-10-27 NOTE — Progress Notes (Unsigned)
Office Visit    Patient Name: Charles [name] Date of Encounter: 10/27/2022  Primary Care Provider:  Jackelyn Poling, DO Primary Cardiologist:  Olga Millers, MD  Chief Complaint    82 year old male with a history of paroxysmal atrial fibrillation, tachycardia mediated cardiomyopathy, mitral valve regurgitation, hypertension, CKD stage IIIb, hypothyroidism, OSA, and GERD who presents for follow-up related to A-fib.   Past Medical History    Past Medical History:  Diagnosis Date   Arthritis    Atrial dilatation    mild biatrial dilitation   Bruises easily    CHF (congestive heart failure) (HCC)    CKD (chronic kidney disease), stage III (HCC)    Coronary artery disease    Dysrhythmia    atrial fibrillation   GERD (gastroesophageal reflux disease)    H/O hiatal hernia    Hypercholesterolemia    Hyperglycemia    Hypertension    Hypothyroidism    Mitral regurgitation    a. Mild by echo in 01/2014.   Nonischemic cardiomyopathy (HCC)    a. presumed to be tachycardia mediated. Varying EFs over the years from 35-40% to normal and back.   Paroxysmal atrial fibrillation (HCC)    a. H/o difficult to control. b. s/p PVI ablation 12-03-2012 by [name] with recurrence afterwards, requiring period of amiodarone.   Peripheral edema    Sleep apnea    USES C PAP   Tear of medial meniscus of left knee    Tremor    Past Surgical History:  Procedure Laterality Date   ATRIAL FIBRILLATION ABLATION N/A 12/03/2012   Procedure: ATRIAL FIBRILLATION ABLATION;  Surgeon: Hillis Range, MD;  Location: [facility] CATH LAB;  Service: Cardiovascular;  Laterality: N/A;   ATRIAL FIBRILLATION ABLATION     PVI by [name]   CARDIAC CATHETERIZATION     CARDIOVERSION N/A 09/04/2012   Procedure: CARDIOVERSION;  Surgeon: [name], MD;  Location: [facility] ENDOSCOPY;  Service: Cardiovascular;  Laterality: N/A;   CARDIOVERSION N/A 02/11/2013   Procedure: CARDIOVERSION;  Surgeon: [name], MD;  Location: [facility]  ENDOSCOPY;  Service: Cardiovascular;  Laterality: N/A;   CARDIOVERSION N/A 12/07/2015   Procedure: CARDIOVERSION;  Surgeon: Chrystie Nose, MD;  Location: [facility] ENDOSCOPY;  Service: Cardiovascular;  Laterality: N/A;   CARDIOVERSION N/A 03/11/2018   Procedure: CARDIOVERSION;  Surgeon: Lars Masson, MD;  Location: [facility] ENDOSCOPY;  Service: Cardiovascular;  Laterality: N/A;   CARDIOVERSION N/A 09/23/2020   Procedure: CARDIOVERSION;  Surgeon: [name], MD;  Location: [facility] ENDOSCOPY;  Service: Cardiovascular;  Laterality: N/A;   CARDIOVERSION N/A 08/19/2021   Procedure: CARDIOVERSION;  Surgeon: Jake Bathe, MD;  Location: [facility] ENDOSCOPY;  Service: Cardiovascular;  Laterality: N/A;   CARDIOVERSION N/A 10/23/2022   Procedure: CARDIOVERSION;  Surgeon: Meriam Sprague, MD;  Location: [facility] INVASIVE CV LAB;  Service: Cardiovascular;  Laterality: N/A;   CATARACT EXTRACTION, BILATERAL     EYE SURGERY  2015,2016   bilateral cataract surgery with lens implants   HERNIA REPAIR  03/2005   JOINT REPLACEMENT  2005   total right hip replacement   kidney stent  1987, 02/2005   KIDNEY STONE SURGERY     2 stents placed   KNEE ARTHROSCOPY WITH DRILLING/MICROFRACTURE Left 11/20/2013   Procedure: KNEE ARTHROSCOPY WITH DRILLING/MICROFRACTURE;  Surgeon: Jacki Cones, MD;  Location: [facility] ORS;  Service: Orthopedics;  Laterality: Left;   KNEE ARTHROSCOPY WITH MEDIAL MENISECTOMY Left 11/20/2013   Procedure: LEFT KNEE ARTHROSCOPY WITH MEDIAL MENISECTOMY;  Surgeon: Jacki Cones,  MD;  Location: [facility] ORS;  Service: Orthopedics;  Laterality: Left;  Microfracture of medial femoral chondyle and abrasion chondroplasty of the medial femoral chondyle   LITHOTRIPSY     x 2   PICC line placement  2007   quadrec  1999   LFT leg quad sx   right hip surgery  2007   irrigation and debridement from bacteria   TEE WITHOUT CARDIOVERSION N/A 12/02/2012   Procedure: TRANSESOPHAGEAL ECHOCARDIOGRAM (TEE);  Surgeon: [name], MD;   Location: [facility] ENDOSCOPY;  Service: Cardiovascular;  Laterality: N/A;   TOTAL HIP ARTHROPLASTY Left 08/12/2015   Procedure: LEFT TOTAL HIP ARTHROPLASTY;  Surgeon: Ranee Gosselin, MD;  Location: [facility] ORS;  Service: Orthopedics;  Laterality: Left;    Allergies  Allergies  Allergen Reactions   Ceftriaxone Sodium Rash    Rocephin   Clindamycin/Lincomycin Itching and Rash    Hoarse voice   Diltiazem Hcl Rash    Cardizem   Penicillins Rash    Has patient had a PCN reaction causing immediate rash, facial/tongue/throat swelling, SOB or lightheadedness with hypotension: Unknown Has patient had a PCN reaction causing severe rash involving mucus membranes or skin necrosis: Unknown Has patient had a PCN reaction that required hospitalization: No Has patient had a PCN reaction occurring within the last 10 years: Yes If all of the above answers are "NO", then may proceed with Cephalosporin use.      Labs/Other Studies Reviewed    The following studies were reviewed today: Echo 02/2022:  IMPRESSIONS    1. Left ventricular ejection fraction, by estimation, is 55 to 60%. The  left ventricle has normal function. The left ventricle has no regional  wall motion abnormalities. There is mild concentric left ventricular  hypertrophy. Left ventricular diastolic  parameters are consistent with Grade II diastolic dysfunction  (pseudonormalization).   2. Right ventricular systolic function is normal. The right ventricular  size is mildly enlarged. There is severely elevated pulmonary artery  systolic pressure. The estimated right ventricular systolic pressure is  69.8 mmHg.   3. Left atrial size was severely dilated.   4. Right atrial size was mildly dilated.   5. The mitral valve is abnormal. Moderate mitral valve regurgitation. No  evidence of mitral stenosis. Moderate mitral annular calcification.   6. Tricuspid valve regurgitation is moderate.   7. The aortic valve is tricuspid. There is mild  calcification of the  aortic valve. Aortic valve regurgitation is not visualized. Aortic valve  sclerosis/calcification is present, without any evidence of aortic  stenosis.   8. The inferior vena cava is dilated in size with >50% respiratory  variability, suggesting right atrial pressure of 8 mmHg.   Comparison(s): 05/31/20 EF 55%. PA pressure .     Recent Labs: 10/19/2022: BUN 46; Creatinine, Ser 2.12; Hemoglobin 10.4; Magnesium 2.2; Platelets 252; Potassium 4.8; Sodium 139; TSH 1.890  Recent Lipid Panel    Component Value Date/Time   CHOL 187 03/15/2020 0911   TRIG 208 (H) 03/15/2020 0911   HDL 41 03/15/2020 0911   CHOLHDL 4.6 03/15/2020 0911   VLDL 50 (H) 05/05/2015 1023   LDLCALC 114 (H) 03/15/2020 0911   LDLDIRECT 95.3 10/24/2011 1022    History of Present Illness    82 year old male with the above past medical history including paroxysmal atrial fibrillation, tachycardia mediated cardiomyopathy, mitral valve regurgitation, hypertension, CKD stage IIIb, hypothyroidism, OSA, and GERD.   He is a history of atrial fibrillation s/p multiple DCCV's.  He underwent mapping and ablation  of atrial fibrillation in 11/2012 by Dr. [name].  Carotid Dopplers in April 2017 were normal.  He is on amiodarone and edoxaban.  Most recent echocardiogram in 02/2022 showed EF 55 to 60%, normal LV function, no RWMA, mild concentric LVH, G2 DD, severely elevated PASP (69.8 mmHg), moderate mitral valve regurgitation, moderate TR.  Repeat echo was recommended in 1 year.  He contacted our office on 10/09/2022 with concern for irregular heart rhythm, elevated heart rate.  He denied any associated symptoms. He was last seen in the office on 10/09/2022 and remained in rate controlled atrial fibrillation, he denied any associated symptoms but did report a history of decompensation in the past with persistent atrial fibrillation. He underwent DCCV on 10/23/2022 with restoration of sinus rhythm.  He contacted our  office on 10/26/2022 with complaints of generalized weakness, fatigue.   He presents today for follow-up accompanied by his wife.  Since his last visit and since his cardioversion he notes generalized weakness, aching, decreased appetite, fatigue, weight gain of 5 to 6 pounds, abdominal fullness, and lower extremity edema.  He has a resting tremor and this has worsened since his procedure. He denies any dizziness, presyncope, syncope.  He does note some dyspnea on exertion, denies chest pain.  He is concerned about his recent symptoms.  Home Medications    Current Outpatient Medications  Medication Sig Dispense Refill   acetaminophen (TYLENOL) 650 MG CR tablet Take 650 mg by mouth in the morning and at bedtime.     amiodarone (PACERONE) 200 MG tablet TAKE ONE TABLET BY MOUTH DAILY 90 tablet 3   amLODipine (NORVASC) 10 MG tablet Take 1 tablet (10 mg total) by mouth daily. 90 tablet 3   Ascorbic Acid (VITAMIN C) 500 MG tablet Take 1,000 mg by mouth 2 (two) times daily.     Cholecalciferol (VITAMIN D) 125 MCG (5000 UT) CAPS Take 5,000 Units by mouth in the morning.     Coenzyme Q10 (COQ10) 100 MG CAPS Take 100 mg by mouth every morning.     edoxaban ([name]) 60 MG TABS tablet Take 30 mg by mouth daily. (Patient taking differently: Take 30 mg by mouth every evening.) 90 tablet 3   enalapril (VASOTEC) 20 MG tablet TAKE 1/2 TABLET BY MOUTH EVERY NIGHT AT BEDTIME 45 tablet 6   EQ MAGNESIUM CITRATE PO Take 420 mg by mouth daily at 12 noon.     furosemide (LASIX) 40 MG tablet Take 40 mg by mouth in the morning.     IRON PO Take 65 mg by mouth daily at 12 noon.     levothyroxine (SYNTHROID) 125 MCG tablet TAKE ONE TABLET BY MOUTH DAILY BEFORE BREAKFAST 90 tablet 1   Misc Natural Products (OSTEO BI-FLEX JOINT SHIELD PO) Take 2 tablets by mouth every morning.     Multiple Vitamin (MULTIVITAMIN) tablet Take 1 tablet by mouth daily at 12 noon.     Omega-3 Fatty Acids (FISH OIL) 1200 MG CAPS Take 1,200 mg by  mouth 2 (two) times daily.     omeprazole (PRILOSEC) 40 MG capsule TAKE ONE CAPSULE BY MOUTH DAILY (Patient taking differently: Take 40 mg by mouth every evening.) 90 capsule 3   polyvinyl alcohol (LIQUIFILM TEARS) 1.4 % ophthalmic solution Place 1 drop into both eyes 3 (three) times daily as needed for dry eyes.     zinc gluconate 50 MG tablet Take 50 mg by mouth daily at 12 noon.     No current facility-administered medications for this  visit.     Review of Systems    He denies chest pain, palpitations, pnd, orthopnea, n, v, dizziness, syncope, or early satiety. All other systems reviewed and are otherwise negative except as noted above.   Physical Exam    VS:  BP (!) 126/50 (BP Location: Right Arm, Patient Position: Sitting, Cuff Size: Normal)   Pulse 68   Ht 5\' 9"  (1.753 m)   Wt 181 lb (82.1 kg)   BMI 26.73 kg/m   GEN: Well nourished, well developed, in no acute distress. HEENT: normal. Neck: Supple, no JVD, carotid bruits, or masses. Cardiac: RRR, no murmurs, rubs, or gallops. No clubbing, cyanosis, nonpitting bilateral lower extremity edema.  Radials/DP/PT 2+ and equal bilaterally.  Respiratory:  Respirations regular and unlabored, clear to auscultation bilaterally. GI: Soft, nontender, nondistended, BS + x 4. MS: no deformity or atrophy. Skin: warm and dry, no rash. Neuro:  Strength and sensation are intact. Psych: Normal affect.  Accessory Clinical Findings    ECG personally reviewed by me today -NSR, 68 bpm- no acute changes.   Lab Results  Component Value Date   WBC 9.7 10/19/2022   HGB 10.4 (L) 10/19/2022   HCT 31.8 (L) 10/19/2022   MCV 97 10/19/2022   PLT 252 10/19/2022   Lab Results  Component Value Date   CREATININE 2.12 (H) 10/19/2022   BUN 46 (H) 10/19/2022   NA 139 10/19/2022   K 4.8 10/19/2022   CL 103 10/19/2022   CO2 19 (L) 10/19/2022   Lab Results  Component Value Date   ALT 17 09/09/2021   AST 21 09/09/2021   ALKPHOS 72 09/09/2021    BILITOT 0.3 09/09/2021   Lab Results  Component Value Date   CHOL 187 03/15/2020   HDL 41 03/15/2020   LDLCALC 114 (H) 03/15/2020   LDLDIRECT 95.3 10/24/2011   TRIG 208 (H) 03/15/2020   CHOLHDL 4.6 03/15/2020    No results found for: "HGBA1C"  Assessment & Plan    1. Persistent atrial fibrillation/generalized weakness/DOE/lower extremity edema: He has a history of decompensation with persistent atrial fibrillation and has undergone multiple DCCV's./P DCCV on 10/23/2022 (was reloaded with amiodarone prior to procedure).  He is maintaining sinus rhythm.  He has noted significant weakness, fatigue, dyspnea on exertion, abdominal fullness, and weight gain since his procedure.  Labs prior to DCCV including BMET, CBC, TSH, Mg generally stable, he was noted to have mild anemia, hemoglobin was 10.4. Discussed with Dr. Royann Shivers, DOD.  BP and HR are stable.  He is maintaining sinus rhythm.  QTc is 457 ms. Will check CBC, CMET, TSH, BNP, and amiodarone level (to rule out amiodarone toxicity following recent loading dose).  He is on maintenance dose of amiodarone 200 mg daily, will continue for now.  Will refer to EP-as he may not tolerate amiodarone long-term.  Will increase Lasix to 80 mg daily x 3 days, followed by 40 mg daily.  Will check BMET in 1 week. Discussed ED precautions. Continue amiodarone, edoxaban.    2. Mitral valve regurgitation: Most recent echocardiogram in 02/2022 showed EF 55 to 60%, normal LV function, no RWMA, mild concentric LVH, G2 DD, severely elevated PASP (69.8 mmHg), moderate mitral valve regurgitation, moderate TR. Plan for repeat echo in 02/2023.    3. Hypertension: BP well controlled. Continue current antihypertensive regimen.    4. CKD stage IIIb: Creatinine was stable at 2.33 in 02/2022.  Continue to monitor closely with your increased diuretics.  Following with nephrology.  5. Hypothyroidism: TSH was 1.28 in 08/2021.  Repeat TSH pending as above. Monitored and managed per  PCP.    6. OSA: Adherent to CPAP. Will refer to Dr. Tresa Endo for sleep medicine as he has a history of sleep apnea but has not seen a provider for this in many years.  7. Anemia: Hemoglobin prior to cardioversion was 10.4.  Question worsening anemia in the setting of recent symptoms as above.  Repeat CBC pending.  Denies bleeding.   8. Disposition:  Follow-up as scheduled on 11/22/2022.      Joylene Grapes, NP 10/27/2022, 3:44 PM

## 2022-10-28 ENCOUNTER — Other Ambulatory Visit: Payer: Self-pay | Admitting: Nurse Practitioner

## 2022-10-28 DIAGNOSIS — I4819 Other persistent atrial fibrillation: Secondary | ICD-10-CM

## 2022-10-28 LAB — CBC
Hematocrit: 27.9 % — ABNORMAL LOW (ref 37.5–51.0)
Hemoglobin: 9.4 g/dL — ABNORMAL LOW (ref 13.0–17.7)
MCH: 31.2 pg (ref 26.6–33.0)
MCHC: 33.7 g/dL (ref 31.5–35.7)
MCV: 93 fL (ref 79–97)
Platelets: 256 10*3/uL (ref 150–450)
RBC: 3.01 x10E6/uL — ABNORMAL LOW (ref 4.14–5.80)
RDW: 11.7 % (ref 11.6–15.4)
WBC: 10.9 10*3/uL — ABNORMAL HIGH (ref 3.4–10.8)

## 2022-10-28 LAB — COMPREHENSIVE METABOLIC PANEL
ALT: 33 IU/L (ref 0–44)
AST: 27 IU/L (ref 0–40)
Albumin/Globulin Ratio: 1.4 (ref 1.2–2.2)
Albumin: 3.9 g/dL (ref 3.7–4.7)
Alkaline Phosphatase: 79 IU/L (ref 44–121)
BUN/Creatinine Ratio: 23 (ref 10–24)
BUN: 68 mg/dL — ABNORMAL HIGH (ref 8–27)
Bilirubin Total: 0.4 mg/dL (ref 0.0–1.2)
CO2: 19 mmol/L — ABNORMAL LOW (ref 20–29)
Calcium: 9.5 mg/dL (ref 8.6–10.2)
Chloride: 104 mmol/L (ref 96–106)
Creatinine, Ser: 2.99 mg/dL — ABNORMAL HIGH (ref 0.76–1.27)
Globulin, Total: 2.7 g/dL (ref 1.5–4.5)
Glucose: 120 mg/dL — ABNORMAL HIGH (ref 70–99)
Potassium: 4.4 mmol/L (ref 3.5–5.2)
Sodium: 140 mmol/L (ref 134–144)
Total Protein: 6.6 g/dL (ref 6.0–8.5)
eGFR: 20 mL/min/{1.73_m2} — ABNORMAL LOW (ref >59)

## 2022-10-28 LAB — BRAIN NATRIURETIC PEPTIDE: BNP: 325.7 pg/mL — ABNORMAL HIGH (ref 0.0–100.0)

## 2022-10-28 LAB — TSH: TSH: 1.6 u[IU]/mL (ref 0.450–4.500)

## 2022-10-29 ENCOUNTER — Encounter: Payer: Self-pay | Admitting: Nurse Practitioner

## 2022-10-31 ENCOUNTER — Ambulatory Visit: Payer: Medicare Other | Attending: Cardiovascular Disease | Admitting: Cardiovascular Disease

## 2022-10-31 ENCOUNTER — Other Ambulatory Visit: Payer: Self-pay

## 2022-10-31 ENCOUNTER — Encounter: Payer: Self-pay | Admitting: Cardiovascular Disease

## 2022-10-31 VITALS — BP 138/56 | HR 66 | Ht 69.0 in | Wt 180.0 lb

## 2022-10-31 DIAGNOSIS — N184 Chronic kidney disease, stage 4 (severe): Secondary | ICD-10-CM | POA: Diagnosis not present

## 2022-10-31 DIAGNOSIS — Z5181 Encounter for therapeutic drug level monitoring: Secondary | ICD-10-CM | POA: Diagnosis not present

## 2022-10-31 DIAGNOSIS — I4819 Other persistent atrial fibrillation: Secondary | ICD-10-CM | POA: Diagnosis not present

## 2022-10-31 DIAGNOSIS — G4731 Primary central sleep apnea: Secondary | ICD-10-CM

## 2022-10-31 NOTE — Progress Notes (Signed)
Electrophysiology Office Note:    Date:  10/31/2022   ID:  Charles [name], DOB [birth year], MRN [id number]  PCP:  Jackelyn Poling, DO   [facility] Providers Cardiologist:  Olga Millers, MD     Referring MD: Joylene Grapes, NP   History of Present Illness:    Charles [name] is a 82 y.o. male with a hx listed below, significant for paroxysmal atrial fibrillation and tachycardia mediated cardiomyopathy, mitral valve regurgitation, CKD stage III, hypothyroidism, hypertension, sleep apnea referred for arrhythmia management.  He underwent atrial fibrillation ablation in June 2014 by Dr. [name]. He has had about 9 cardioversions over the years. The prior cardioversion was in January, 2023.  He has been maintained on amiodarone but return the office in April 2024 with recurrence of atrial fibrillation, rate controlled he underwent DC cardioversion on April 29.  He reports that it took him weeks to recover from the most recent cardioversion. He has had fluid retention, increased weakness.  Past Medical History:  Diagnosis Date   Arthritis    Atrial dilatation    mild biatrial dilitation   Bruises easily    CHF (congestive heart failure) (HCC)    CKD (chronic kidney disease), stage III (HCC)    Coronary artery disease    Dysrhythmia    atrial fibrillation   GERD (gastroesophageal reflux disease)    H/O hiatal hernia    Hypercholesterolemia    Hyperglycemia    Hypertension    Hypothyroidism    Mitral regurgitation    a. Mild by echo in 01/2014.   Nonischemic cardiomyopathy (HCC)    a. presumed to be tachycardia mediated. Varying EFs over the years from 35-40% to normal and back.   Paroxysmal atrial fibrillation (HCC)    a. H/o difficult to control. b. s/p PVI ablation 12-03-2012 by [name] with recurrence afterwards, requiring period of amiodarone.   Peripheral edema    Sleep apnea    USES C PAP   Tear of medial meniscus of left knee    Tremor     Past Surgical  History:  Procedure Laterality Date   ATRIAL FIBRILLATION ABLATION N/A 12/03/2012   Procedure: ATRIAL FIBRILLATION ABLATION;  Surgeon: Hillis Range, MD;  Location: [facility] CATH LAB;  Service: Cardiovascular;  Laterality: N/A;   ATRIAL FIBRILLATION ABLATION     PVI by [name]   CARDIAC CATHETERIZATION     CARDIOVERSION N/A 09/04/2012   Procedure: CARDIOVERSION;  Surgeon: [name], MD;  Location: [facility] ENDOSCOPY;  Service: Cardiovascular;  Laterality: N/A;   CARDIOVERSION N/A 02/11/2013   Procedure: CARDIOVERSION;  Surgeon: [name], MD;  Location: [facility] ENDOSCOPY;  Service: Cardiovascular;  Laterality: N/A;   CARDIOVERSION N/A 12/07/2015   Procedure: CARDIOVERSION;  Surgeon: Chrystie Nose, MD;  Location: [facility] ENDOSCOPY;  Service: Cardiovascular;  Laterality: N/A;   CARDIOVERSION N/A 03/11/2018   Procedure: CARDIOVERSION;  Surgeon: Lars Masson, MD;  Location: [facility] ENDOSCOPY;  Service: Cardiovascular;  Laterality: N/A;   CARDIOVERSION N/A 09/23/2020   Procedure: CARDIOVERSION;  Surgeon: [name], MD;  Location: [facility] ENDOSCOPY;  Service: Cardiovascular;  Laterality: N/A;   CARDIOVERSION N/A 08/19/2021   Procedure: CARDIOVERSION;  Surgeon: Jake Bathe, MD;  Location: [facility] ENDOSCOPY;  Service: Cardiovascular;  Laterality: N/A;   CARDIOVERSION N/A 10/23/2022   Procedure: CARDIOVERSION;  Surgeon: Meriam Sprague, MD;  Location: [facility] INVASIVE CV LAB;  Service: Cardiovascular;  Laterality: N/A;   CATARACT EXTRACTION, BILATERAL     EYE SURGERY  2015,2016  bilateral cataract surgery with lens implants   HERNIA REPAIR  03/2005   JOINT REPLACEMENT  2005   total right hip replacement   kidney stent  1987, 02/2005   KIDNEY STONE SURGERY     2 stents placed   KNEE ARTHROSCOPY WITH DRILLING/MICROFRACTURE Left 11/20/2013   Procedure: KNEE ARTHROSCOPY WITH DRILLING/MICROFRACTURE;  Surgeon: Jacki Cones, MD;  Location: [facility] ORS;  Service: Orthopedics;  Laterality: Left;   KNEE ARTHROSCOPY WITH  MEDIAL MENISECTOMY Left 11/20/2013   Procedure: LEFT KNEE ARTHROSCOPY WITH MEDIAL MENISECTOMY;  Surgeon: Jacki Cones, MD;  Location: [facility] ORS;  Service: Orthopedics;  Laterality: Left;  Microfracture of medial femoral chondyle and abrasion chondroplasty of the medial femoral chondyle   LITHOTRIPSY     x 2   PICC line placement  2007   quadrec  1999   LFT leg quad sx   right hip surgery  2007   irrigation and debridement from bacteria   TEE WITHOUT CARDIOVERSION N/A 12/02/2012   Procedure: TRANSESOPHAGEAL ECHOCARDIOGRAM (TEE);  Surgeon: [name], MD;  Location: [facility] ENDOSCOPY;  Service: Cardiovascular;  Laterality: N/A;   TOTAL HIP ARTHROPLASTY Left 08/12/2015   Procedure: LEFT TOTAL HIP ARTHROPLASTY;  Surgeon: Ranee Gosselin, MD;  Location: [facility] ORS;  Service: Orthopedics;  Laterality: Left;    Current Medications: Current Meds  Medication Sig   acetaminophen (TYLENOL) 650 MG CR tablet Take 650 mg by mouth in the morning and at bedtime.   amiodarone (PACERONE) 200 MG tablet TAKE 2 TABLETS BY MOUTH TWO TIMES A DAY FOR 1 WEEK THEN 1 TABLET TWO TIMES A DAY FOR 1 WEEK THEN RESUME 1 TABLET (200MG ) DAILY   amLODipine (NORVASC) 10 MG tablet Take 1 tablet (10 mg total) by mouth daily.   Ascorbic Acid (VITAMIN C) 500 MG tablet Take 1,000 mg by mouth 2 (two) times daily.   Cholecalciferol (VITAMIN D) 125 MCG (5000 UT) CAPS Take 5,000 Units by mouth in the morning.   Coenzyme Q10 (COQ10) 100 MG CAPS Take 100 mg by mouth every morning.   edoxaban ([name]) 60 MG TABS tablet Take 30 mg by mouth daily. (Patient taking differently: Take 30 mg by mouth every evening.)   enalapril (VASOTEC) 20 MG tablet TAKE 1/2 TABLET BY MOUTH EVERY NIGHT AT BEDTIME   EQ MAGNESIUM CITRATE PO Take 420 mg by mouth daily at 12 noon.   furosemide (LASIX) 40 MG tablet Take 40 mg by mouth in the morning.   IRON PO Take 65 mg by mouth daily at 12 noon.   levothyroxine (SYNTHROID) 125 MCG tablet TAKE ONE TABLET BY MOUTH DAILY  BEFORE BREAKFAST   Misc Natural Products (OSTEO BI-FLEX JOINT SHIELD PO) Take 2 tablets by mouth every morning.   Multiple Vitamin (MULTIVITAMIN) tablet Take 1 tablet by mouth daily at 12 noon.   Omega-3 Fatty Acids (FISH OIL) 1200 MG CAPS Take 1,200 mg by mouth 2 (two) times daily.   omeprazole (PRILOSEC) 40 MG capsule TAKE ONE CAPSULE BY MOUTH DAILY (Patient taking differently: Take 40 mg by mouth every evening.)   polyvinyl alcohol (LIQUIFILM TEARS) 1.4 % ophthalmic solution Place 1 drop into both eyes 3 (three) times daily as needed for dry eyes.   zinc gluconate 50 MG tablet Take 50 mg by mouth daily at 12 noon.     Allergies:   Ceftriaxone sodium, Clindamycin/lincomycin, Diltiazem hcl, and Penicillins   Social and Family History: Reviewed in Epic  ROS:   Please see the history of present  illness.    All other systems reviewed and are negative.  EKGs/Labs/Other Studies Reviewed Today:    Echocardiogram:  TTE 03/16/2022 EF 55 to 60%, grade 2 diastolic dysfunction, severely dilated left atrium   Monitors:   Stress testing:   Advanced imaging:   EKG:  Last EKG results: today - sinus rhythm, nonspecific ST and T wave abnormality   Recent Labs: 10/19/2022: Magnesium 2.2 10/27/2022: ALT 33; BNP 325.7; BUN 68; Creatinine, Ser 2.99; Hemoglobin 9.4; Platelets 256; Potassium 4.4; Sodium 140; TSH 1.600     Physical Exam:    VS:  BP (!) 138/56   Pulse 66   Ht 5\' 9"  (1.753 m)   Wt 180 lb (81.6 kg)   SpO2 98%   BMI 26.58 kg/m     Wt Readings from Last 3 Encounters:  10/31/22 180 lb (81.6 kg)  10/27/22 181 lb (82.1 kg)  10/23/22 172 lb (78 kg)     GEN: Well nourished, well developed in no acute distress CARDIAC: RRR, no murmurs, rubs, gallops RESPIRATORY:  Normal work of breathing MUSCULOSKELETAL: no edema    ASSESSMENT & PLAN:    Persistent atrial fibrillation Status post ablation 2014, multiple cardioversions -- recent cardioversions seem to hold him for  year. Has had recurrence of A-fib on amiodarone Severely dilated LA in September 2023 (4.6 cm) Due to age, LA size, fineness of AF on ECG, I do not think he is a good candidate for ablation at this point. If he remains in sinus rhythm for a long duration as he has previously, intermittent cardioversions may remain a reasonable strategy. If not, the only option would be AVJ/pacer. We had a long discussion regarding these issues and came to this conclusion jointly.  Monitoring of high risk medication Recent had TSH. Will order PFTs  Severe pulmonary hypertension May need to consider as possible contributor to edema. Pt has follow-up in Cardiology clinic  Mitral valve regurgitation Moderate on most recent echo, 03/15/2022  CKD stage IIIb Most recent Cr 2.99 - limits AAD options  Obstructive sleep apnea Adherent to CPAP    Greater than one hour spent on this visit.        Medication Adjustments/Labs and Tests Ordered: Current medicines are reviewed at length with the patient today.  Concerns regarding medicines are outlined above.  Orders Placed This Encounter  Procedures   EKG 12-Lead   No orders of the defined types were placed in this encounter.    Signed, Maurice Small, MD  10/31/2022 9:13 AM    [facility]

## 2022-10-31 NOTE — Patient Instructions (Addendum)
Medication Instructions:  Your physician recommends that you continue on your current medications as directed. Please refer to the Current Medication list given to you today. *If you need a refill on your cardiac medications before your next appointment, please call your pharmacy*   Testing/Procedures: Pulmonary Function Test Echocardiogram Your physician has requested that you have an echocardiogram. Echocardiography is a painless test that uses sound waves to create images of your heart. It provides your doctor with information about the size and shape of your heart and how well your heart's chambers and valves are working. This procedure takes approximately one hour. There are no restrictions for this procedure. Please do NOT wear cologne, perfume, aftershave, or lotions (deodorant is allowed). Please arrive 15 minutes prior to your appointment time.     Follow-Up: At [facility], you and your health needs are our priority.  As part of our continuing mission to provide you with exceptional heart care, we have created designated Provider Care Teams.  These Care Teams include your primary Cardiologist (physician) and Advanced Practice Providers (APPs -  Physician Assistants and Nurse Practitioners) who all work together to provide you with the care you need, when you need it.  We recommend signing up for the patient portal called "MyChart".  Sign up information is provided on this After Visit Summary.  MyChart is used to connect with patients for Virtual Visits (Telemedicine).  Patients are able to view lab/test results, encounter notes, upcoming appointments, etc.  Non-urgent messages can be sent to your provider as well.   To learn more about what you can do with MyChart, go to [email or web address].    Your next appointment:   6 month(s)  Provider:   York Pellant, MD

## 2022-11-02 ENCOUNTER — Telehealth: Payer: Self-pay

## 2022-11-02 NOTE — Telephone Encounter (Signed)
Spoke with pt. Pt was notified of results and recommendations. Pt will discuss anemia with his PCP.

## 2022-11-03 DIAGNOSIS — N1832 Chronic kidney disease, stage 3b: Secondary | ICD-10-CM | POA: Diagnosis not present

## 2022-11-03 DIAGNOSIS — I340 Nonrheumatic mitral (valve) insufficiency: Secondary | ICD-10-CM | POA: Diagnosis not present

## 2022-11-03 DIAGNOSIS — I4819 Other persistent atrial fibrillation: Secondary | ICD-10-CM | POA: Diagnosis not present

## 2022-11-03 DIAGNOSIS — I10 Essential (primary) hypertension: Secondary | ICD-10-CM | POA: Diagnosis not present

## 2022-11-03 LAB — BASIC METABOLIC PANEL
BUN/Creatinine Ratio: 20 (ref 10–24)
BUN: 44 mg/dL — ABNORMAL HIGH (ref 8–27)
CO2: 20 mmol/L (ref 20–29)
Calcium: 9.6 mg/dL (ref 8.6–10.2)
Chloride: 107 mmol/L — ABNORMAL HIGH (ref 96–106)
Creatinine, Ser: 2.19 mg/dL — ABNORMAL HIGH (ref 0.76–1.27)
Glucose: 183 mg/dL — ABNORMAL HIGH (ref 70–99)
Potassium: 4.6 mmol/L (ref 3.5–5.2)
Sodium: 142 mmol/L (ref 134–144)
eGFR: 29 mL/min/{1.73_m2} — ABNORMAL LOW (ref >59)

## 2022-11-06 DIAGNOSIS — H35013 Changes in retinal vascular appearance, bilateral: Secondary | ICD-10-CM | POA: Diagnosis not present

## 2022-11-06 DIAGNOSIS — N2581 Secondary hyperparathyroidism of renal origin: Secondary | ICD-10-CM | POA: Diagnosis not present

## 2022-11-06 DIAGNOSIS — H524 Presbyopia: Secondary | ICD-10-CM | POA: Diagnosis not present

## 2022-11-06 DIAGNOSIS — N184 Chronic kidney disease, stage 4 (severe): Secondary | ICD-10-CM | POA: Diagnosis not present

## 2022-11-06 DIAGNOSIS — D631 Anemia in chronic kidney disease: Secondary | ICD-10-CM | POA: Diagnosis not present

## 2022-11-06 DIAGNOSIS — I129 Hypertensive chronic kidney disease with stage 1 through stage 4 chronic kidney disease, or unspecified chronic kidney disease: Secondary | ICD-10-CM | POA: Diagnosis not present

## 2022-11-06 DIAGNOSIS — H35363 Drusen (degenerative) of macula, bilateral: Secondary | ICD-10-CM | POA: Diagnosis not present

## 2022-11-06 DIAGNOSIS — H40013 Open angle with borderline findings, low risk, bilateral: Secondary | ICD-10-CM | POA: Diagnosis not present

## 2022-11-06 DIAGNOSIS — H35033 Hypertensive retinopathy, bilateral: Secondary | ICD-10-CM | POA: Diagnosis not present

## 2022-11-10 DIAGNOSIS — H524 Presbyopia: Secondary | ICD-10-CM | POA: Diagnosis not present

## 2022-11-17 DIAGNOSIS — N184 Chronic kidney disease, stage 4 (severe): Secondary | ICD-10-CM | POA: Diagnosis not present

## 2022-11-22 ENCOUNTER — Ambulatory Visit: Payer: Medicare Other | Attending: Nurse Practitioner | Admitting: Nurse Practitioner

## 2022-11-22 ENCOUNTER — Encounter: Payer: Self-pay | Admitting: Nurse Practitioner

## 2022-11-22 VITALS — BP 122/52 | HR 57 | Ht 69.0 in | Wt 168.0 lb

## 2022-11-22 DIAGNOSIS — I340 Nonrheumatic mitral (valve) insufficiency: Secondary | ICD-10-CM

## 2022-11-22 DIAGNOSIS — I4819 Other persistent atrial fibrillation: Secondary | ICD-10-CM | POA: Diagnosis not present

## 2022-11-22 DIAGNOSIS — R600 Localized edema: Secondary | ICD-10-CM

## 2022-11-22 DIAGNOSIS — R531 Weakness: Secondary | ICD-10-CM | POA: Diagnosis not present

## 2022-11-22 DIAGNOSIS — I10 Essential (primary) hypertension: Secondary | ICD-10-CM

## 2022-11-22 DIAGNOSIS — N1832 Chronic kidney disease, stage 3b: Secondary | ICD-10-CM

## 2022-11-22 DIAGNOSIS — R0609 Other forms of dyspnea: Secondary | ICD-10-CM | POA: Diagnosis not present

## 2022-11-22 DIAGNOSIS — G4733 Obstructive sleep apnea (adult) (pediatric): Secondary | ICD-10-CM

## 2022-11-22 DIAGNOSIS — E039 Hypothyroidism, unspecified: Secondary | ICD-10-CM

## 2022-11-22 DIAGNOSIS — D649 Anemia, unspecified: Secondary | ICD-10-CM

## 2022-11-22 NOTE — Progress Notes (Signed)
Office Visit    Patient Name: Charles [name] Date of Encounter: 11/22/2022  Primary Care Provider:  Jackelyn Poling, DO Primary Cardiologist:  Olga Millers, MD  Chief Complaint    82 year old male with a history of paroxysmal atrial fibrillation, tachycardia mediated cardiomyopathy, mitral valve regurgitation, hypertension, CKD stage IIIb, hypothyroidism, OSA, and GERD who presents for follow-up related to A-fib.   Past Medical History    Past Medical History:  Diagnosis Date   Arthritis    Atrial dilatation    mild biatrial dilitation   Bruises easily    CHF (congestive heart failure) (HCC)    CKD (chronic kidney disease), stage III (HCC)    Coronary artery disease    Dysrhythmia    atrial fibrillation   GERD (gastroesophageal reflux disease)    H/O hiatal hernia    Hypercholesterolemia    Hyperglycemia    Hypertension    Hypothyroidism    Mitral regurgitation    a. Mild by echo in 01/2014.   Nonischemic cardiomyopathy (HCC)    a. presumed to be tachycardia mediated. Varying EFs over the years from 35-40% to normal and back.   Paroxysmal atrial fibrillation (HCC)    a. H/o difficult to control. b. s/p PVI ablation 12-03-2012 by [name] with recurrence afterwards, requiring period of amiodarone.   Peripheral edema    Sleep apnea    USES C PAP   Tear of medial meniscus of left knee    Tremor    Past Surgical History:  Procedure Laterality Date   ATRIAL FIBRILLATION ABLATION N/A 12/03/2012   Procedure: ATRIAL FIBRILLATION ABLATION;  Surgeon: Hillis Range, MD;  Location: [facility] CATH LAB;  Service: Cardiovascular;  Laterality: N/A;   ATRIAL FIBRILLATION ABLATION     PVI by [name]   CARDIAC CATHETERIZATION     CARDIOVERSION N/A 09/04/2012   Procedure: CARDIOVERSION;  Surgeon: [name], MD;  Location: [facility] ENDOSCOPY;  Service: Cardiovascular;  Laterality: N/A;   CARDIOVERSION N/A 02/11/2013   Procedure: CARDIOVERSION;  Surgeon: [name], MD;  Location: [facility]  ENDOSCOPY;  Service: Cardiovascular;  Laterality: N/A;   CARDIOVERSION N/A 12/07/2015   Procedure: CARDIOVERSION;  Surgeon: Chrystie Nose, MD;  Location: [facility] ENDOSCOPY;  Service: Cardiovascular;  Laterality: N/A;   CARDIOVERSION N/A 03/11/2018   Procedure: CARDIOVERSION;  Surgeon: Lars Masson, MD;  Location: [facility] ENDOSCOPY;  Service: Cardiovascular;  Laterality: N/A;   CARDIOVERSION N/A 09/23/2020   Procedure: CARDIOVERSION;  Surgeon: [name], MD;  Location: [facility] ENDOSCOPY;  Service: Cardiovascular;  Laterality: N/A;   CARDIOVERSION N/A 08/19/2021   Procedure: CARDIOVERSION;  Surgeon: Jake Bathe, MD;  Location: [facility] ENDOSCOPY;  Service: Cardiovascular;  Laterality: N/A;   CARDIOVERSION N/A 10/23/2022   Procedure: CARDIOVERSION;  Surgeon: Meriam Sprague, MD;  Location: [facility] INVASIVE CV LAB;  Service: Cardiovascular;  Laterality: N/A;   CATARACT EXTRACTION, BILATERAL     EYE SURGERY  2015,2016   bilateral cataract surgery with lens implants   HERNIA REPAIR  03/2005   JOINT REPLACEMENT  2005   total right hip replacement   kidney stent  1987, 02/2005   KIDNEY STONE SURGERY     2 stents placed   KNEE ARTHROSCOPY WITH DRILLING/MICROFRACTURE Left 11/20/2013   Procedure: KNEE ARTHROSCOPY WITH DRILLING/MICROFRACTURE;  Surgeon: Jacki Cones, MD;  Location: [facility] ORS;  Service: Orthopedics;  Laterality: Left;   KNEE ARTHROSCOPY WITH MEDIAL MENISECTOMY Left 11/20/2013   Procedure: LEFT KNEE ARTHROSCOPY WITH MEDIAL MENISECTOMY;  Surgeon: Jacki Cones,  MD;  Location: [facility] ORS;  Service: Orthopedics;  Laterality: Left;  Microfracture of medial femoral chondyle and abrasion chondroplasty of the medial femoral chondyle   LITHOTRIPSY     x 2   PICC line placement  2007   quadrec  1999   LFT leg quad sx   right hip surgery  2007   irrigation and debridement from bacteria   TEE WITHOUT CARDIOVERSION N/A 12/02/2012   Procedure: TRANSESOPHAGEAL ECHOCARDIOGRAM (TEE);  Surgeon: [name], MD;   Location: [facility] ENDOSCOPY;  Service: Cardiovascular;  Laterality: N/A;   TOTAL HIP ARTHROPLASTY Left 08/12/2015   Procedure: LEFT TOTAL HIP ARTHROPLASTY;  Surgeon: Ranee Gosselin, MD;  Location: [facility] ORS;  Service: Orthopedics;  Laterality: Left;    Allergies  Allergies  Allergen Reactions   Ceftriaxone Sodium Rash    Rocephin   Clindamycin/Lincomycin Itching and Rash    Hoarse voice   Diltiazem Hcl Rash    Cardizem   Penicillins Rash    Has patient had a PCN reaction causing immediate rash, facial/tongue/throat swelling, SOB or lightheadedness with hypotension: Unknown Has patient had a PCN reaction causing severe rash involving mucus membranes or skin necrosis: Unknown Has patient had a PCN reaction that required hospitalization: No Has patient had a PCN reaction occurring within the last 10 years: Yes If all of the above answers are "NO", then may proceed with Cephalosporin use.      Labs/Other Studies Reviewed    The following studies were reviewed today:  Cardiac Studies & Procedures       ECHOCARDIOGRAM  ECHOCARDIOGRAM COMPLETE 03/16/2022  Narrative ECHOCARDIOGRAM REPORT    Patient Name:   Charles [name] Date of Exam: 03/16/2022 Medical Rec #:  [id number]        Height:       70.0 in Accession #:    [id number]       Weight:       181.4 lb Date of Birth:  05-Jun-1941        BSA:          2.003 m Patient Age:    81 years         BP:           138/60 mmHg Patient Gender: M                HR:           60 bpm. Exam Location:  [address]  Procedure: 2D Echo, Cardiac Doppler, Color Doppler and Intracardiac Opacification Agent  Indications:    R60.9 Edema  History:        Patient has prior history of Echocardiogram examinations, most recent 05/31/2020. CHF and Cardiomyopathy, Arrythmias:Atrial Fibrillation, Signs/Symptoms:Edema and Shortness of Breath; Risk Factors:Hypertension, Sleep Apnea and Dyslipidemia. CKD stage 3.  Sonographer:    Jorje Guild BS,  RDCS Referring Phys: [id number] BRIAN S CRENSHAW  IMPRESSIONS   1. Left ventricular ejection fraction, by estimation, is 55 to 60%. The left ventricle has normal function. The left ventricle has no regional wall motion abnormalities. There is mild concentric left ventricular hypertrophy. Left ventricular diastolic parameters are consistent with Grade II diastolic dysfunction (pseudonormalization). 2. Right ventricular systolic function is normal. The right ventricular size is mildly enlarged. There is severely elevated pulmonary artery systolic pressure. The estimated right ventricular systolic pressure is 69.8 mmHg. 3. Left atrial size was severely dilated. 4. Right atrial size was mildly dilated. 5. The mitral valve is abnormal. Moderate mitral valve regurgitation. No evidence  of mitral stenosis. Moderate mitral annular calcification. 6. Tricuspid valve regurgitation is moderate. 7. The aortic valve is tricuspid. There is mild calcification of the aortic valve. Aortic valve regurgitation is not visualized. Aortic valve sclerosis/calcification is present, without any evidence of aortic stenosis. 8. The inferior vena cava is dilated in size with >50% respiratory variability, suggesting right atrial pressure of 8 mmHg.  Comparison(s): 05/31/20 EF 55%. PA pressure .  FINDINGS Left Ventricle: Left ventricular ejection fraction, by estimation, is 55 to 60%. The left ventricle has normal function. The left ventricle has no regional wall motion abnormalities. Definity contrast agent was given IV to delineate the left ventricular endocardial borders. The left ventricular internal cavity size was normal in size. There is mild concentric left ventricular hypertrophy. Left ventricular diastolic parameters are consistent with Grade II diastolic dysfunction (pseudonormalization).  Right Ventricle: The right ventricular size is mildly enlarged. No increase in right ventricular wall thickness. Right  ventricular systolic function is normal. There is severely elevated pulmonary artery systolic pressure. The tricuspid regurgitant velocity is 3.93 m/s, and with an assumed right atrial pressure of 8 mmHg, the estimated right ventricular systolic pressure is 69.8 mmHg.  Left Atrium: Left atrial size was severely dilated.  Right Atrium: Right atrial size was mildly dilated.  Pericardium: There is no evidence of pericardial effusion.  Mitral Valve: The mitral valve is abnormal. There is mild calcification of the mitral valve leaflet(s). Moderate mitral annular calcification. Moderate mitral valve regurgitation. No evidence of mitral valve stenosis.  Tricuspid Valve: The tricuspid valve is normal in structure. Tricuspid valve regurgitation is moderate.  Aortic Valve: The aortic valve is tricuspid. There is mild calcification of the aortic valve. Aortic valve regurgitation is not visualized. Aortic valve sclerosis/calcification is present, without any evidence of aortic stenosis.  Pulmonic Valve: The pulmonic valve was normal in structure. Pulmonic valve regurgitation is not visualized.  Aorta: The aortic root is normal in size and structure.  Venous: The inferior vena cava is dilated in size with greater than 50% respiratory variability, suggesting right atrial pressure of 8 mmHg.  IAS/Shunts: No atrial level shunt detected by color flow Doppler.   LEFT VENTRICLE PLAX 2D LVIDd:         5.90 cm LVIDs:         4.50 cm LV PW:         0.80 cm LV IVS:        0.90 cm LVOT diam:     2.40 cm LV SV:         123 LV SV Index:   61 LVOT Area:     4.52 cm   RIGHT VENTRICLE             IVC RV Basal diam:  4.60 cm     IVC diam: 2.30 cm RV S prime:     14.20 cm/s TAPSE (M-mode): 2.7 cm RVSP:           69.8 mmHg  LEFT ATRIUM              Index        RIGHT ATRIUM           Index LA diam:        4.60 cm  2.30 cm/m   RA Pressure: 8.00 mmHg LA Vol (A2C):   109.0 ml 54.43 ml/m  RA Area:      19.70 cm LA Vol (A4C):   125.0 ml 62.42 ml/m  RA Volume:   56.30  ml  28.11 ml/m LA Biplane Vol: 122.0 ml 60.92 ml/m AORTIC VALVE LVOT Vmax:   122.00 cm/s LVOT Vmean:  74.600 cm/s LVOT VTI:    0.272 m  AORTA Ao Root diam: 3.20 cm Ao Asc diam:  3.20 cm  TRICUSPID VALVE TR Peak grad:   61.8 mmHg TR Vmax:        393.00 cm/s Estimated RAP:  8.00 mmHg RVSP:           69.8 mmHg  SHUNTS Systemic VTI:  0.27 m Systemic Diam: 2.40 cm  Dalton McleanMD Electronically signed by Wilfred Lacy Signature Date/Time: 03/16/2022/1:47:42 PM    Final            Recent Labs: 10/19/2022: Magnesium 2.2 10/27/2022: ALT 33; BNP 325.7; Hemoglobin 9.4; Platelets 256; TSH 1.600 11/03/2022: BUN 44; Creatinine, Ser 2.19; Potassium 4.6; Sodium 142  Recent Lipid Panel    Component Value Date/Time   CHOL 187 03/15/2020 0911   TRIG 208 (H) 03/15/2020 0911   HDL 41 03/15/2020 0911   CHOLHDL 4.6 03/15/2020 0911   VLDL 50 (H) 05/05/2015 1023   LDLCALC 114 (H) 03/15/2020 0911   LDLDIRECT 95.3 10/24/2011 1022    History of Present Illness    82 year old male with the above past medical history including paroxysmal atrial fibrillation, tachycardia mediated cardiomyopathy, mitral valve regurgitation, hypertension, CKD stage IIIb, hypothyroidism, OSA, and GERD.   He has a history of atrial fibrillation s/p multiple DCCV's.  He underwent mapping and ablation of atrial fibrillation in 11/2012 by Dr. [name].  Carotid Dopplers in April 2017 were normal.  He is on amiodarone and edoxaban.  Most recent echocardiogram in 02/2022 showed EF 55 to 60%, normal LV function, no RWMA, mild concentric LVH, G2 DD, severely elevated PASP (69.8 mmHg), moderate mitral valve regurgitation, moderate TR.  Repeat echo was recommended in 1 year.  He contacted our office on 10/09/2022 with concern for irregular heart rhythm, elevated heart rate.  He denied any associated symptoms. He was noted to be in rate controlled atrial  fibrillation, he denied any associated symptoms but did report a history of decompensation in the past with persistent atrial fibrillation. He underwent DCCV on 10/23/2022 with restoration of sinus rhythm.  He contacted our office on 10/26/2022 with complaints of generalized weakness, fatigue.  He was last seen in the office on 10/27/2022 and noted worsening dyspnea on exertion, generalized weakness, fatigue, weight gain, abdominal fullness, bilateral lower extremity edema.  He was maintaining sinus rhythm.  Lasix was increased.  He was referred to EP given concern for amiodarone toxicity.  He was seen by EP on 10/31/2022.  He was not considered a good candidate for repeat ablation given age, LA size.  It was noted that he may benefit from intermittent cardioversions.  Amiodarone was continued.  PFTs were ordered and are pending.   He presents today for follow-up accompanied by his wife.  Since his last visit he has been stable overall from a cardiac standpoint.  He does note ongoing weakness, fatigue, likely in the setting of ongoing anemia.  He has follow-up scheduled with his PCP.  His nephrologist transitioned him from Lasix to torsemide  resulting in improved diuresis, though he is unsure of the current dose.  He denies any chest pain, dyspnea, edema, PND, orthopnea, weight gain.  Denies palpitations, dizziness, presyncope, syncope.  Home Medications    Current Outpatient Medications  Medication Sig Dispense Refill   acetaminophen (TYLENOL) 650 MG CR tablet Take 650 mg by  mouth in the morning and at bedtime.     amiodarone (PACERONE) 200 MG tablet TAKE 2 TABLETS BY MOUTH TWO TIMES A DAY FOR 1 WEEK THEN 1 TABLET TWO TIMES A DAY FOR 1 WEEK THEN RESUME 1 TABLET (200MG ) DAILY 42 tablet 1   amLODipine (NORVASC) 10 MG tablet Take 1 tablet (10 mg total) by mouth daily. 90 tablet 3   Ascorbic Acid (VITAMIN C) 500 MG tablet Take 1,000 mg by mouth 2 (two) times daily.     Cholecalciferol (VITAMIN D) 125 MCG (5000  UT) CAPS Take 5,000 Units by mouth in the morning.     Coenzyme Q10 (COQ10) 100 MG CAPS Take 100 mg by mouth every morning.     edoxaban ([name]) 60 MG TABS tablet Take 30 mg by mouth daily. (Patient taking differently: Take 30 mg by mouth every evening.) 90 tablet 3   enalapril (VASOTEC) 20 MG tablet TAKE 1/2 TABLET BY MOUTH EVERY NIGHT AT BEDTIME 45 tablet 6   EQ MAGNESIUM CITRATE PO Take 420 mg by mouth daily at 12 noon.     IRON PO Take 65 mg by mouth daily at 12 noon.     levothyroxine (SYNTHROID) 125 MCG tablet TAKE ONE TABLET BY MOUTH DAILY BEFORE BREAKFAST 90 tablet 1   Misc Natural Products (OSTEO BI-FLEX JOINT SHIELD PO) Take 2 tablets by mouth every morning.     Multiple Vitamin (MULTIVITAMIN) tablet Take 1 tablet by mouth daily at 12 noon.     Omega-3 Fatty Acids (FISH OIL) 1200 MG CAPS Take 1,200 mg by mouth 2 (two) times daily.     omeprazole (PRILOSEC) 40 MG capsule TAKE ONE CAPSULE BY MOUTH DAILY (Patient taking differently: Take 40 mg by mouth every evening.) 90 capsule 3   zinc gluconate 50 MG tablet Take 50 mg by mouth daily at 12 noon.     furosemide (LASIX) 40 MG tablet Take 40 mg by mouth in the morning. (Patient not taking: Reported on 11/22/2022)     polyvinyl alcohol (LIQUIFILM TEARS) 1.4 % ophthalmic solution Place 1 drop into both eyes 3 (three) times daily as needed for dry eyes. (Patient not taking: Reported on 11/22/2022)     No current facility-administered medications for this visit.     Review of Systems    He denies chest pain, palpitations, dyspnea, pnd, orthopnea, n, v, dizziness, syncope, edema, weight gain, or early satiety. All other systems reviewed and are otherwise negative except as noted above.   Physical Exam    VS:  BP (!) 122/52 (BP Location: Left Arm, Patient Position: Sitting, Cuff Size: Normal)   Pulse (!) 57   Ht 5\' 9"  (1.753 m)   Wt 168 lb (76.2 kg)   SpO2 97%   BMI 24.81 kg/m   GEN: Well nourished, well developed, in no acute  distress. HEENT: normal. Neck: Supple, no JVD, carotid bruits, or masses. Cardiac: RRR, no murmurs, rubs, or gallops. No clubbing, cyanosis, edema.  Radials/DP/PT 2+ and equal bilaterally.  Respiratory:  Respirations regular and unlabored, clear to auscultation bilaterally. GI: Soft, nontender, nondistended, BS + x 4. MS: no deformity or atrophy. Skin: warm and dry, no rash. Neuro:  Strength and sensation are intact. Psych: Normal affect.  Accessory Clinical Findings    ECG personally reviewed by me today -sinus bradycardia, 57 bpm- no acute changes.   Lab Results  Component Value Date   WBC 10.9 (H) 10/27/2022   HGB 9.4 (L) 10/27/2022   HCT 27.9 (L)  10/27/2022   MCV 93 10/27/2022   PLT 256 10/27/2022   Lab Results  Component Value Date   CREATININE 2.19 (H) 11/03/2022   BUN 44 (H) 11/03/2022   NA 142 11/03/2022   K 4.6 11/03/2022   CL 107 (H) 11/03/2022   CO2 20 11/03/2022   Lab Results  Component Value Date   ALT 33 10/27/2022   AST 27 10/27/2022   ALKPHOS 79 10/27/2022   BILITOT 0.4 10/27/2022   Lab Results  Component Value Date   CHOL 187 03/15/2020   HDL 41 03/15/2020   LDLCALC 114 (H) 03/15/2020   LDLDIRECT 95.3 10/24/2011   TRIG 208 (H) 03/15/2020   CHOLHDL 4.6 03/15/2020    No results found for: "HGBA1C"  Assessment & Plan    1. Persistent atrial fibrillation/generalized weakness/DOE/lower extremity edema: He has a history of decompensation with persistent atrial fibrillation and has undergone multiple DCCV's. S/p recent DCCV on 10/23/2022 (was reloaded with amiodarone prior to procedure).  He is maintaining sinus rhythm.  Postprocedure he had evidence of fluid volume overload.  He has been transitioned from Lasix to torsemide per nephrology.  Also with ongoing anemia, which is likely contributing to his symptoms.. Euvolemic and well compensated on exam.  BP and HR are stable. He is maintaining sinus rhythm.  Following with EP.  Per EP, not a candidate for  repeat ablation.  Pending PFTs, repeat echo as below.  Continue amiodarone, edoxaban.     2. Mitral valve regurgitation: Most recent echocardiogram in 02/2022 showed EF 55 to 60%, normal LV function, no RWMA, mild concentric LVH, G2 DD, severely elevated PASP (69.8 mmHg), moderate mitral valve regurgitation, moderate TR. Repeat echo scheduled for 11/2022.   3. Hypertension: BP well controlled. Continue current antihypertensive regimen.    4. CKD stage IIIb: Creatinine was stable at 2.19 in 10/2022.  Continue to monitor closely with your increased diuretics.  Following with nephrology.   5. Hypothyroidism: TSH was 1.6 in 10/2022. Monitored and managed per PCP.    6. OSA: Adherent to CPAP.  Pending follow-up with Dr. Tresa Endo.   7. Anemia: Hemoglobin prior to cardioversion was 9.4 in 10/2022.  Denies bleeding though he notes there was hematuria present on recent urinalysis per nephrology.  Follow-up with PCP.   8. Disposition:  Follow-up as scheduled with Dr. Tresa Endo, EP, follow-up in 6 months with Dr. Jens Som.      Joylene Grapes, NP 11/22/2022, 9:51 AM

## 2022-11-22 NOTE — Patient Instructions (Signed)
Medication Instructions:  Your physician recommends that you continue on your current medications as directed. Please refer to the Current Medication list given to you today.   *If you need a refill on your cardiac medications before your next appointment, please call your pharmacy*   Lab Work: NONE ordered at this time of appointment   If you have labs (blood work) drawn today and your tests are completely normal, you will receive your results only by: MyChart Message (if you have MyChart) OR A paper copy in the mail If you have any lab test that is abnormal or we need to change your treatment, we will call you to review the results.   Testing/Procedures: NONE ordered at this time of appointment     Follow-Up: At [facility], you and your health needs are our priority.  As part of our continuing mission to provide you with exceptional heart care, we have created designated Provider Care Teams.  These Care Teams include your primary Cardiologist (physician) and Advanced Practice Providers (APPs -  Physician Assistants and Nurse Practitioners) who all work together to provide you with the care you need, when you need it.  We recommend signing up for the patient portal called "MyChart".  Sign up information is provided on this After Visit Summary.  MyChart is used to connect with patients for Virtual Visits (Telemedicine).  Patients are able to view lab/test results, encounter notes, upcoming appointments, etc.  Non-urgent messages can be sent to your provider as well.   To learn more about what you can do with MyChart, go to [email or web address].    Your next appointment:   6 month(s)  Provider:   Brian Crenshaw, MD     Other Instructions   

## 2022-11-27 ENCOUNTER — Other Ambulatory Visit: Payer: Self-pay | Admitting: Cardiology

## 2022-11-29 DIAGNOSIS — G4733 Obstructive sleep apnea (adult) (pediatric): Secondary | ICD-10-CM | POA: Diagnosis not present

## 2022-11-29 DIAGNOSIS — I5189 Other ill-defined heart diseases: Secondary | ICD-10-CM | POA: Diagnosis not present

## 2022-11-29 DIAGNOSIS — N184 Chronic kidney disease, stage 4 (severe): Secondary | ICD-10-CM | POA: Diagnosis not present

## 2022-11-29 DIAGNOSIS — D649 Anemia, unspecified: Secondary | ICD-10-CM | POA: Diagnosis not present

## 2022-11-30 ENCOUNTER — Ambulatory Visit (HOSPITAL_BASED_OUTPATIENT_CLINIC_OR_DEPARTMENT_OTHER): Payer: Medicare Other

## 2022-11-30 ENCOUNTER — Ambulatory Visit (HOSPITAL_COMMUNITY)
Admission: RE | Admit: 2022-11-30 | Discharge: 2022-11-30 | Disposition: A | Discharge status: Home / Self Care | Payer: Medicare Other | Source: Ambulatory Visit | Attending: Cardiovascular Disease | Admitting: Cardiovascular Disease

## 2022-11-30 DIAGNOSIS — I4819 Other persistent atrial fibrillation: Secondary | ICD-10-CM

## 2022-11-30 DIAGNOSIS — G4731 Primary central sleep apnea: Secondary | ICD-10-CM | POA: Insufficient documentation

## 2022-11-30 DIAGNOSIS — Z5181 Encounter for therapeutic drug level monitoring: Secondary | ICD-10-CM | POA: Insufficient documentation

## 2022-11-30 LAB — PULMONARY FUNCTION TEST
DL/VA % pred: 48 %
DL/VA: 1.89 ml/min/mmHg/L
DLCO unc % pred: 47 %
DLCO unc: 11.14 ml/min/mmHg
FEF 25-75 Pre: 3.62 L/sec
FEF2575-%Pred-Pre: 209 %
FEV1-%Pred-Pre: 124 %
FEV1-Pre: 3.27 L
FEV1FVC-%Pred-Pre: 117 %
FEV6-%Pred-Pre: 111 %
FEV6-Pre: 3.86 L
FEV6FVC-%Pred-Pre: 106 %
FVC-%Pred-Pre: 104 %
FVC-Pre: 3.91 L
Pre FEV1/FVC ratio: 84 %
Pre FEV6/FVC Ratio: 99 %
RV % pred: 70 %
RV: 1.88 L
TLC % pred: 85 %
TLC: 5.89 L

## 2022-11-30 LAB — ECHOCARDIOGRAM COMPLETE
Area-P 1/2: 5.09 cm2
MV M vel: 4.81 m/s
MV Peak grad: 92.5 mmHg
S' Lateral: 3.4 cm

## 2022-11-30 MED ORDER — ALBUTEROL SULFATE (2.5 MG/3ML) 0.083% IN NEBU: 2.5000 mg | INHALATION_SOLUTION | Freq: Once | RESPIRATORY_TRACT | Status: DC

## 2022-12-01 DIAGNOSIS — N200 Calculus of kidney: Secondary | ICD-10-CM | POA: Diagnosis not present

## 2022-12-04 DIAGNOSIS — R3121 Asymptomatic microscopic hematuria: Secondary | ICD-10-CM | POA: Diagnosis not present

## 2022-12-06 DIAGNOSIS — I129 Hypertensive chronic kidney disease with stage 1 through stage 4 chronic kidney disease, or unspecified chronic kidney disease: Secondary | ICD-10-CM | POA: Diagnosis not present

## 2022-12-06 DIAGNOSIS — N184 Chronic kidney disease, stage 4 (severe): Secondary | ICD-10-CM | POA: Diagnosis not present

## 2022-12-06 DIAGNOSIS — D631 Anemia in chronic kidney disease: Secondary | ICD-10-CM | POA: Diagnosis not present

## 2022-12-06 DIAGNOSIS — N2581 Secondary hyperparathyroidism of renal origin: Secondary | ICD-10-CM | POA: Diagnosis not present

## 2022-12-06 LAB — LAB REPORT - SCANNED: EGFR: 27

## 2022-12-12 ENCOUNTER — Telehealth: Payer: Self-pay

## 2022-12-12 NOTE — Telephone Encounter (Signed)
-----   Message from Maurice Small, MD sent at 12/04/2022  8:42 PM EDT ----- PFTs were abnormal. We'll need to bring him in sooner to discuss amiodarone. ----- Message ----- From: Interface, Lab In Three Zero One Sent: 11/30/2022   9:44 AM EDT To: Maurice Small, MD

## 2022-12-12 NOTE — Telephone Encounter (Signed)
-----   Message from Augustus E Mealor, MD sent at 12/04/2022  8:42 PM EDT ----- PFTs were abnormal. We'll need to bring him in sooner to discuss amiodarone. ----- Message ----- From: Interface, Lab In Three Zero One Sent: 11/30/2022   9:44 AM EDT To: Augustus E Mealor, MD   

## 2022-12-12 NOTE — Telephone Encounter (Signed)
Spoke with patient - rescheduled follow up appointment for 12/21/22  Mealor, Roberts Gaudy, MD  Festus Holts, RN PFTs were abnormal. We'll need to bring him in sooner to discuss amiodarone.

## 2022-12-21 ENCOUNTER — Ambulatory Visit: Payer: Medicare Other | Attending: Cardiovascular Disease | Admitting: Cardiovascular Disease

## 2022-12-21 ENCOUNTER — Encounter: Payer: Self-pay | Admitting: Cardiovascular Disease

## 2022-12-21 VITALS — BP 140/66 | HR 64 | Ht 69.0 in | Wt 171.8 lb

## 2022-12-21 DIAGNOSIS — I4819 Other persistent atrial fibrillation: Secondary | ICD-10-CM

## 2022-12-21 NOTE — Progress Notes (Signed)
Electrophysiology Office Note:    Date:  12/21/2022   ID:  MALIC ROSTEN, DOB Feb 03, 1941, MRN [id number]  PCP:  Jackelyn Poling, DO   [facility] Providers Cardiologist:  Olga Millers, MD Electrophysiologist:  Maurice Small, MD     Referring MD: Jackelyn Poling, DO   History of Present Illness:    Charles [name] is a 82 y.o. male with a hx listed below, significant for paroxysmal atrial fibrillation and tachycardia mediated cardiomyopathy, mitral valve regurgitation, CKD stage III, hypothyroidism, hypertension, sleep apnea referred for arrhythmia management.  He underwent atrial fibrillation ablation in June 2014 by Dr. [name]. He has had about 9 cardioversions over the years. The prior cardioversion was in January, 2023.  He has been maintained on amiodarone but return the office in April 2024 with recurrence of atrial fibrillation, rate controlled he underwent DC cardioversion on April 29.  He reports that it took him weeks to recover from the most recent cardioversion. He has had fluid retention, increased weakness.  He returns for follow-up today.  Since his last visit, he underwent routine screening studies for amiodarone management and was found to have severely reduced DLCO (47%) on pulmonary function tests.  He denies having any shortness of breath. 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    Current Medications: Current Meds  Medication Sig   acetaminophen (TYLENOL) 650 MG CR tablet Take 650 mg by mouth in the morning and at bedtime.   amiodarone (PACERONE) 200 MG tablet TAKE 2 TABLETS BY MOUTH TWO TIMES A DAY FOR 1 WEEK THEN 1 TABLET TWO TIMES A DAY FOR 1 WEEK THEN RESUME 1 TABLET (200MG ) DAILY   amLODipine (NORVASC) 10 MG tablet Take 1 tablet (10 mg total) by mouth daily.   Ascorbic Acid (VITAMIN C) 500 MG tablet Take 1,000 mg by mouth 2 (two) times daily.   Cholecalciferol  (VITAMIN D) 125 MCG (5000 UT) CAPS Take 5,000 Units by mouth in the morning.   Coenzyme Q10 (COQ10) 100 MG CAPS Take 100 mg by mouth every morning.   edoxaban ([name]) 60 MG TABS tablet Take 30 mg by mouth daily. (Patient taking differently: Take 30 mg by mouth every evening.)   enalapril (VASOTEC) 20 MG tablet TAKE 1/2 TABLET BY MOUTH EVERY NIGHT AT BEDTIME   EQ MAGNESIUM CITRATE PO Take 420 mg by mouth daily at 12 noon.   IRON PO Take 65 mg by mouth daily at 12 noon.   levothyroxine (SYNTHROID) 125 MCG tablet TAKE ONE TABLET BY MOUTH DAILY BEFORE BREAKFAST   Misc Natural Products (OSTEO BI-FLEX JOINT SHIELD PO) Take 2 tablets by mouth every morning.   Multiple Vitamin (MULTIVITAMIN) tablet Take 1 tablet by mouth daily at 12 noon.   Omega-3 Fatty Acids (FISH OIL) 1200 MG CAPS Take 1,200 mg by mouth 2 (two) times daily.   omeprazole (PRILOSEC) 40 MG capsule TAKE 1 CAPSULE BY MOUTH DAILY   polyvinyl alcohol (LIQUIFILM TEARS) 1.4 % ophthalmic solution Place 1 drop into both eyes 3 (three) times daily as needed for dry eyes.   zinc gluconate 50 MG tablet Take 50 mg by mouth daily at 12 noon.      EKGs/Labs/Other Studies Reviewed Today:    Echocardiogram:  TTE 03/16/2022 EF 55 to 60%, grade 2 diastolic dysfunction, severely dilated left atrium  TTE 11/30/2022 EF 55-60%, severely dilated LA   Monitors:   Stress  testing:   Advanced imaging:   EKG:  Last EKG results: today - sinus rhythm, nonspecific ST and T wave abnormality   Recent Labs: 10/19/2022: Magnesium 2.2 10/27/2022: ALT 33; BNP 325.7; Hemoglobin 9.4; Platelets 256; TSH 1.600 11/03/2022: BUN 44; Creatinine, Ser 2.19; Potassium 4.6; Sodium 142   PFTs 2018 -- DLCO 50% PFTs 2024 -- DLCO 47%  Physical Exam:    VS:  BP (!) 140/66   Pulse 64   Ht 5\' 9"  (1.753 m)   Wt 171 lb 12.8 oz (77.9 kg)   SpO2 98%   BMI 25.37 kg/m     Wt Readings from Last 3 Encounters:  12/21/22 171 lb 12.8 oz (77.9 kg)  11/22/22 168 lb (76.2  kg)  10/31/22 180 lb (81.6 kg)     GEN: Well nourished, well developed in no acute distress CARDIAC: RRR, no murmurs, rubs, gallops RESPIRATORY:  Normal work of breathing MUSCULOSKELETAL: no edema    ASSESSMENT & PLAN:    Persistent atrial fibrillation Status post ablation 2014, multiple cardioversions -- recent cardioversions seem to hold him for year. Has had recurrence of A-fib on amiodarone -- maintaining sinus rhythm now on amiodarone Severely dilated LA in September 2023 (4.6 cm) Due to age, LA size, fineness of AF on ECG, I do not think he is a good candidate for ablation at this point. If he remains in sinus rhythm for a long duration as he has previously, intermittent cardioversions may remain a reasonable strategy. If not, the only option would be AVJ/pacer. We had a long discussion regarding these issues and came to this conclusion jointly.   Monitoring of high risk medication Recent had TSH - ok PFTs showed severely reduced DLCO (47%).  I found a prior pulmonary function test from 2019 that showed a DLCO of 50%.  He has been maintained on amiodarone since then with very minimal change in his DLCO. I had a long discussion with the patient and his wife about the seriousness of pulmonary fibrosis and amiodarone --that it can be debilitating and life threatening disease. He also feels that his quality of life will deteriorate severely with atrial fibrillation and would prefer to stay on the medication with ongoing monitoring. Given the patient's advanced age, minimal change in DLCO over 6 years, and paucity of symptoms, I think it reasonable to continue amiodarone for now.  Severe pulmonary hypertension May need to consider as possible contributor to edema. Pt has follow-up in Cardiology clinic  Mitral valve regurgitation Moderate on most recent echo, 03/15/2022  CKD stage IIIb Most recent Cr 2.99 - limits AAD options  Obstructive sleep apnea Adherent to CPAP   I  reviewed note by Bernadene Person, prior PFT results        Medication Adjustments/Labs and Tests Ordered: Current medicines are reviewed at length with the patient today.  Concerns regarding medicines are outlined above.  Orders Placed This Encounter  Procedures   EKG 12-Lead   Pulmonary Function Test   No orders of the defined types were placed in this encounter.    Signed, Maurice Small, MD  12/21/2022 9:08 AM    [facility]

## 2022-12-21 NOTE — Patient Instructions (Signed)
Medication Instructions:  Your physician recommends that you continue on your current medications as directed. Please refer to the Current Medication list given to you today. *If you need a refill on your cardiac medications before your next appointment, please call your pharmacy*   Testing/Procedures: Pulmonary Function Test Your physician has recommended that you have a pulmonary function test. Pulmonary Function Tests are a group of tests that measure how well air moves in and out of your lungs.   Follow-Up: At [facility], you and your health needs are our priority.  As part of our continuing mission to provide you with exceptional heart care, we have created designated Provider Care Teams.  These Care Teams include your primary Cardiologist (physician) and Advanced Practice Providers (APPs -  Physician Assistants and Nurse Practitioners) who all work together to provide you with the care you need, when you need it.  We recommend signing up for the patient portal called "MyChart".  Sign up information is provided on this After Visit Summary.  MyChart is used to connect with patients for Virtual Visits (Telemedicine).  Patients are able to view lab/test results, encounter notes, upcoming appointments, etc.  Non-urgent messages can be sent to your provider as well.   To learn more about what you can do with MyChart, go to [email or web address].    Your next appointment:   6 month(s) - after Pulmonary Function Test  Provider:   York Pellant, MD

## 2023-01-03 ENCOUNTER — Other Ambulatory Visit: Payer: Self-pay | Admitting: Cardiology

## 2023-01-03 DIAGNOSIS — I4819 Other persistent atrial fibrillation: Secondary | ICD-10-CM

## 2023-02-05 ENCOUNTER — Other Ambulatory Visit: Payer: Self-pay | Admitting: Cardiology

## 2023-02-07 DIAGNOSIS — N2581 Secondary hyperparathyroidism of renal origin: Secondary | ICD-10-CM | POA: Diagnosis not present

## 2023-02-07 DIAGNOSIS — N184 Chronic kidney disease, stage 4 (severe): Secondary | ICD-10-CM | POA: Diagnosis not present

## 2023-02-07 DIAGNOSIS — I129 Hypertensive chronic kidney disease with stage 1 through stage 4 chronic kidney disease, or unspecified chronic kidney disease: Secondary | ICD-10-CM | POA: Diagnosis not present

## 2023-02-07 DIAGNOSIS — D631 Anemia in chronic kidney disease: Secondary | ICD-10-CM | POA: Diagnosis not present

## 2023-02-12 DIAGNOSIS — M545 Low back pain, unspecified: Secondary | ICD-10-CM | POA: Diagnosis not present

## 2023-02-12 DIAGNOSIS — M25551 Pain in right hip: Secondary | ICD-10-CM | POA: Diagnosis not present

## 2023-02-13 ENCOUNTER — Encounter: Payer: Self-pay | Admitting: Nephrology

## 2023-02-20 ENCOUNTER — Other Ambulatory Visit: Payer: Self-pay | Admitting: Cardiology

## 2023-02-20 DIAGNOSIS — E039 Hypothyroidism, unspecified: Secondary | ICD-10-CM

## 2023-02-22 ENCOUNTER — Telehealth: Payer: Self-pay | Admitting: Cardiology

## 2023-02-22 NOTE — Telephone Encounter (Signed)
I spoke with patient and he is asymptomatic. He states he  has been in AFIB since last Friday. This morning heart rate was 116. He is taking amiodarone and edoxaban as prescribed. Advised to continue to monitor and we will give him a call with appointment

## 2023-02-22 NOTE — Telephone Encounter (Signed)
STAT if HR is under 50 or over 120 (normal HR is 60-100 beats per minute)  What is your heart rate? This morning 116  Do you have a log of your heart rate readings (document readings)?  Do you have any other symptoms? Patient called stating his heart rate is irregular. Patient states he has no other symptoms. Patient stated he noticed it was out of rhythm since last Friday. Patient stated this morning his BP was 127/77 HR 116. Please advise.

## 2023-02-28 NOTE — Telephone Encounter (Signed)
Spoke with patient and confirmed appointment on 9/27 at 10:45 am with Dr Nelly Laurence. No further patient concerns at this time

## 2023-03-23 ENCOUNTER — Ambulatory Visit: Payer: Medicare Other | Attending: Cardiovascular Disease | Admitting: Cardiovascular Disease

## 2023-03-23 ENCOUNTER — Encounter: Payer: Self-pay | Admitting: Cardiovascular Disease

## 2023-03-23 VITALS — BP 104/66 | HR 70 | Ht 69.0 in | Wt 163.2 lb

## 2023-03-23 DIAGNOSIS — I4819 Other persistent atrial fibrillation: Secondary | ICD-10-CM

## 2023-03-23 LAB — BASIC METABOLIC PANEL
BUN/Creatinine Ratio: 25 — ABNORMAL HIGH (ref 10–24)
BUN: 75 mg/dL — ABNORMAL HIGH (ref 8–27)
CO2: 24 mmol/L (ref 20–29)
Calcium: 10.6 mg/dL — ABNORMAL HIGH (ref 8.6–10.2)
Chloride: 102 mmol/L (ref 96–106)
Creatinine, Ser: 3.02 mg/dL — ABNORMAL HIGH (ref 0.76–1.27)
Glucose: 112 mg/dL — ABNORMAL HIGH (ref 70–99)
Potassium: 4.8 mmol/L (ref 3.5–5.2)
Sodium: 140 mmol/L (ref 134–144)
eGFR: 20 mL/min/{1.73_m2} — ABNORMAL LOW (ref >59)

## 2023-03-23 LAB — CBC
Hematocrit: 33.2 % — ABNORMAL LOW (ref 37.5–51.0)
Hemoglobin: 10.7 g/dL — ABNORMAL LOW (ref 13.0–17.7)
MCH: 30.7 pg (ref 26.6–33.0)
MCHC: 32.2 g/dL (ref 31.5–35.7)
MCV: 95 fL (ref 79–97)
Platelets: 346 10*3/uL (ref 150–450)
RBC: 3.48 x10E6/uL — ABNORMAL LOW (ref 4.14–5.80)
RDW: 11.8 % (ref 11.6–15.4)
WBC: 10.6 10*3/uL (ref 3.4–10.8)

## 2023-03-23 NOTE — Progress Notes (Unsigned)
Electrophysiology Office Note:    Date:  03/23/2023   ID:  Charles [name], DOB 04-Jan-1941, [name]  PCP:  Jackelyn Poling, DO    [facility] Providers Cardiologist:  Olga Millers, MD Electrophysiologist:  Maurice Small, MD     Referring MD: Jackelyn Poling, DO   History of Present Illness:    Charles [name] is a 82 y.o. male with a hx listed below, significant for paroxysmal atrial fibrillation and tachycardia mediated cardiomyopathy, mitral valve regurgitation, CKD stage III, hypothyroidism, hypertension, sleep apnea referred for arrhythmia management.  He underwent atrial fibrillation ablation in June 2014 by Dr. [name]. He has had about 9 cardioversions over the years. The prior cardioversion was in January, 2023.  He has been maintained on amiodarone but return the office in April 2024 with recurrence of atrial fibrillation, rate controlled he underwent DC cardioversion on April 29. It took him weeks to recover from this episode of AF and cardioversion. He had fluid retention, increased weakness.  He underwent routine screening studies for amiodarone management and was found to have severely reduced DLCO (47%) on pulmonary function tests.  A-fib recurred in August 2024.  He is extremely symptomatic and unable to function normally.  He has fatigue and pretty much just sits in the chair and sleeps.    Current Medications: Current Meds  Medication Sig   acetaminophen (TYLENOL) 650 MG CR tablet Take 650 mg by mouth in the morning and at bedtime.   amiodarone (PACERONE) 200 MG tablet TAKE 1 TABLET BY MOUTH DAILY   amLODipine (NORVASC) 10 MG tablet TAKE 1 TABLET BY MOUTH DAILY   Ascorbic Acid (VITAMIN C) 500 MG tablet Take 1,000 mg by mouth 2 (two) times daily.   Cholecalciferol (VITAMIN D) 125 MCG (5000 UT) CAPS Take 5,000 Units by mouth in the morning.   Coenzyme Q10 (COQ10) 100 MG CAPS Take 100 mg by mouth every morning.   edoxaban ([name]) 60 MG TABS tablet  Take 30 mg by mouth daily. (Patient taking differently: Take 30 mg by mouth every evening.)   enalapril (VASOTEC) 20 MG tablet TAKE 1/2 TABLET BY MOUTH EVERY NIGHT AT BEDTIME   EQ MAGNESIUM CITRATE PO Take 420 mg by mouth daily at 12 noon.   IRON PO Take 65 mg by mouth daily at 12 noon.   levothyroxine (SYNTHROID) 125 MCG tablet TAKE 1 TABLET BY MOUTH DAILY BEFORE BREAKFAST   Misc Natural Products (OSTEO BI-FLEX JOINT SHIELD PO) Take 2 tablets by mouth every morning.   Multiple Vitamin (MULTIVITAMIN) tablet Take 1 tablet by mouth daily at 12 noon.   Omega-3 Fatty Acids (FISH OIL) 1200 MG CAPS Take 1,200 mg by mouth 2 (two) times daily.   omeprazole (PRILOSEC) 40 MG capsule TAKE 1 CAPSULE BY MOUTH DAILY (Patient taking differently: Take 40 mg by mouth. Monday, Wednesday, Friday)   polyvinyl alcohol (LIQUIFILM TEARS) 1.4 % ophthalmic solution Place 1 drop into both eyes 3 (three) times daily as needed for dry eyes.   torsemide (DEMADEX) 20 MG tablet Take 20 mg by mouth once.   zinc gluconate 50 MG tablet Take 50 mg by mouth daily at 12 noon.   [DISCONTINUED] furosemide (LASIX) 40 MG tablet Take 40 mg by mouth in the morning.      EKGs/Labs/Other Studies Reviewed Today:    Echocardiogram:  TTE 03/16/2022 EF 55 to 60%, grade 2 diastolic dysfunction, severely dilated left atrium  TTE 11/30/2022 EF 55-60%, severely dilated LA   Monitors:  Stress testing:   Advanced imaging:   EKG:  Last EKG results: today - sinus rhythm, nonspecific ST and T wave abnormality   Recent Labs: 10/19/2022: Magnesium 2.2 10/27/2022: ALT 33; BNP 325.7; Hemoglobin 9.4; Platelets 256; TSH 1.600 11/03/2022: BUN 44; Creatinine, Ser 2.19; Potassium 4.6; Sodium 142   PFTs 2018 -- DLCO 50% PFTs 2024 -- DLCO 47%  Physical Exam:    VS:  BP 104/66   Pulse 70   Ht 5\' 9"  (1.753 m)   Wt 163 lb 3.2 oz (74 kg)   SpO2 97%   BMI 24.10 kg/m     Wt Readings from Last 3 Encounters:  03/23/23 163 lb 3.2 oz (74 kg)   12/21/22 171 lb 12.8 oz (77.9 kg)  11/22/22 168 lb (76.2 kg)     GEN: Well nourished, well developed in no acute distress CARDIAC: RRR, no murmurs, rubs, gallops RESPIRATORY:  Normal work of breathing MUSCULOSKELETAL: no edema    ASSESSMENT & PLAN:    Persistent atrial fibrillation Status post ablation 2014, multiple cardioversions -- recent cardioversions seem to hold him for year. Has had recurrence of A-fib on amiodarone -- maintaining sinus rhythm now on amiodarone Severely dilated LA in September 2023 (4.6 cm) Previously, I did not think he was a candidate for repeat AF ablation and had planned to consider AVJ ablation and pacemaker placement if he has recurrence.  However, now with pulsed field ablation, I do not think the risk of AF ablation is significantly higher than that of AVJ ablation and pacemaker placement.  Due to his age, he is of higher risk than normal for any type of intervention, however. We discussed options for management at length.  I explained that atrial fibrillation ablation does carry risk, and he is a high risk than normal due to his age.  However, his quality of life is currently so low he is eager to proceed. Plan for cardioversion, follow-up to reassess prior to ablation  We discussed the indication, rationale, logistics, anticipated benefits, and potential risks of the ablation procedure including but not limited to -- bleed at the groin access site, chest pain, damage to nearby organs such as the diaphragm, lungs, or esophagus, need for a drainage tube, or prolonged hospitalization. I explained that the risk for stroke, heart attack, need for open chest surgery, or even death is very low but not zero. he  expressed understanding and wishes to proceed.    Monitoring of high risk medication Recent had TSH - ok PFTs showed severely reduced DLCO (47%).  I found a prior pulmonary function test from 2019 that showed a DLCO of 50%.  He has been maintained on  amiodarone since then with very minimal change in his DLCO. I had a long discussion with the patient and his wife about the seriousness of pulmonary fibrosis and amiodarone --that it can be debilitating and life threatening disease. He also feels that his quality of life will deteriorate severely with atrial fibrillation and would prefer to stay on the medication with ongoing monitoring. Given the patient's advanced age, minimal change in DLCO over 6 years, and paucity of symptoms, I think it reasonable to continue amiodarone for now.  Severe pulmonary hypertension May need to consider as possible contributor to edema. Pt has follow-up in Cardiology clinic  Mitral valve regurgitation Moderate on most recent echo, 03/15/2022  CKD stage IIIb Most recent Cr 2.99 - limits AAD options  Obstructive sleep apnea Adherent to CPAP   I reviewed note  by Bernadene Person, prior PFT results        Medication Adjustments/Labs and Tests Ordered: Current medicines are reviewed at length with the patient today.  Concerns regarding medicines are outlined above.  Orders Placed This Encounter  Procedures   EKG 12-Lead   No orders of the defined types were placed in this encounter.    Signed, Maurice Small, MD  03/23/2023 11:00 AM    [facility]

## 2023-03-23 NOTE — Patient Instructions (Addendum)
Medication Instructions:  Your physician recommends that you continue on your current medications as directed. Please refer to the Current Medication list given to you today.  *If you need a refill on your cardiac medications before your next appointment, please call your pharmacy*  Labs: TODAY: CBC, BMET  Testing/Procedures: Your physician has recommended that you have a Cardioversion (DCCV). Electrical Cardioversion uses a jolt of electricity to your heart either through paddles or wired patches attached to your chest. This is a controlled, usually prescheduled, procedure. Defibrillation is done under light anesthesia in the hospital, and you usually go home the day of the procedure. This is done to get your heart back into a normal rhythm. You are not awake for the procedure. Please see the instruction sheet given to you today.  Your physician has requested that you have cardiac CT. Cardiac computed tomography (CT) is a painless test that uses an x-ray machine to take clear, detailed pictures of your heart. We will call you to schedule your CT scan. It will be done about one week prior to your ablation.  Your physician has recommended that you have an ablation. Catheter ablation is a medical procedure used to treat some cardiac arrhythmias (irregular heartbeats). During catheter ablation, a long, thin, flexible tube is put into a blood vessel in your groin (upper thigh), or neck. This tube is called an ablation catheter. It is then guided to your heart through the blood vessel. Radio frequency waves destroy small areas of heart tissue where abnormal heartbeats may cause an arrhythmia to start. We will call you to schedule your ablation.   Follow-Up: At [facility], you and your health needs are our priority.  As part of our continuing mission to provide you with exceptional heart care, we have created designated Provider Care Teams.  These Care Teams include your primary Cardiologist  (physician) and Advanced Practice Providers (APPs -  Physician Assistants and Nurse Practitioners) who all work together to provide you with the care you need, when you need it.   Your next appointment:   4 months with Dr. Nelly Laurence

## 2023-03-23 NOTE — H&P (View-Only) (Signed)
Electrophysiology Office Note:    Date:  03/23/2023   ID:  Charles [name], DOB 04-Jan-1941, [name]  PCP:  Jackelyn Poling, DO    [facility] Cardiologist:  Olga Millers, MD Electrophysiologist:  Maurice Small, MD     Referring MD: Jackelyn Poling, DO   History of Present Illness:    Charles [name] is a 82 y.o. male with a hx listed below, significant for paroxysmal atrial fibrillation and tachycardia mediated cardiomyopathy, mitral valve regurgitation, CKD stage III, hypothyroidism, hypertension, sleep apnea referred for arrhythmia management.  He underwent atrial fibrillation ablation in June 2014 by Dr. [name]. He has had about 9 cardioversions over the years. The prior cardioversion was in January, 2023.  He has been maintained on amiodarone but return the office in April 2024 with recurrence of atrial fibrillation, rate controlled he underwent DC cardioversion on April 29. It took him weeks to recover from this episode of AF and cardioversion. He had fluid retention, increased weakness.  He underwent routine screening studies for amiodarone management and was found to have severely reduced DLCO (47%) on pulmonary function tests.  A-fib recurred in August 2024.  He is extremely symptomatic and unable to function normally.  He has fatigue and pretty much just sits in the chair and sleeps.    Current Medications: Current Meds  Medication Sig   acetaminophen (TYLENOL) 650 MG CR tablet Take 650 mg by mouth in the morning and at bedtime.   amiodarone (PACERONE) 200 MG tablet TAKE 1 TABLET BY MOUTH DAILY   amLODipine (NORVASC) 10 MG tablet TAKE 1 TABLET BY MOUTH DAILY   Ascorbic Acid (VITAMIN C) 500 MG tablet Take 1,000 mg by mouth 2 (two) times daily.   Cholecalciferol (VITAMIN D) 125 MCG (5000 UT) CAPS Take 5,000 Units by mouth in the morning.   Coenzyme Q10 (COQ10) 100 MG CAPS Take 100 mg by mouth every morning.   edoxaban ([name]) 60 MG TABS tablet  Take 30 mg by mouth daily. (Patient taking differently: Take 30 mg by mouth every evening.)   enalapril (VASOTEC) 20 MG tablet TAKE 1/2 TABLET BY MOUTH EVERY NIGHT AT BEDTIME   EQ MAGNESIUM CITRATE PO Take 420 mg by mouth daily at 12 noon.   IRON PO Take 65 mg by mouth daily at 12 noon.   levothyroxine (SYNTHROID) 125 MCG tablet TAKE 1 TABLET BY MOUTH DAILY BEFORE BREAKFAST   Misc Natural Products (OSTEO BI-FLEX JOINT SHIELD PO) Take 2 tablets by mouth every morning.   Multiple Vitamin (MULTIVITAMIN) tablet Take 1 tablet by mouth daily at 12 noon.   Omega-3 Fatty Acids (FISH OIL) 1200 MG CAPS Take 1,200 mg by mouth 2 (two) times daily.   omeprazole (PRILOSEC) 40 MG capsule TAKE 1 CAPSULE BY MOUTH DAILY (Patient taking differently: Take 40 mg by mouth. Monday, Wednesday, Friday)   polyvinyl alcohol (LIQUIFILM TEARS) 1.4 % ophthalmic solution Place 1 drop into both eyes 3 (three) times daily as needed for dry eyes.   torsemide (DEMADEX) 20 MG tablet Take 20 mg by mouth once.   zinc gluconate 50 MG tablet Take 50 mg by mouth daily at 12 noon.   [DISCONTINUED] furosemide (LASIX) 40 MG tablet Take 40 mg by mouth in the morning.      EKGs/Labs/Other Studies Reviewed Today:    Echocardiogram:  TTE 03/16/2022 EF 55 to 60%, grade 2 diastolic dysfunction, severely dilated left atrium  TTE 11/30/2022 EF 55-60%, severely dilated LA   Monitors:  Stress testing:   Advanced imaging:   EKG:  Last EKG results: today - sinus rhythm, nonspecific ST and T wave abnormality   Recent Labs: 10/19/2022: Magnesium 2.2 10/27/2022: ALT 33; BNP 325.7; Hemoglobin 9.4; Platelets 256; TSH 1.600 11/03/2022: BUN 44; Creatinine, Ser 2.19; Potassium 4.6; Sodium 142   PFTs 2018 -- DLCO 50% PFTs 2024 -- DLCO 47%  Physical Exam:    VS:  BP 104/66   Pulse 70   Ht 5\' 9"  (1.753 m)   Wt 163 lb 3.2 oz (74 kg)   SpO2 97%   BMI 24.10 kg/m     Wt Readings from Last 3 Encounters:  03/23/23 163 lb 3.2 oz (74 kg)   12/21/22 171 lb 12.8 oz (77.9 kg)  11/22/22 168 lb (76.2 kg)     GEN: Well nourished, well developed in no acute distress CARDIAC: RRR, no murmurs, rubs, gallops RESPIRATORY:  Normal work of breathing MUSCULOSKELETAL: no edema    ASSESSMENT & PLAN:    Persistent atrial fibrillation Status post ablation 2014, multiple cardioversions -- recent cardioversions seem to hold him for year. Has had recurrence of A-fib on amiodarone -- maintaining sinus rhythm now on amiodarone Severely dilated LA in September 2023 (4.6 cm) Previously, I did not think he was a candidate for repeat AF ablation and had planned to consider AVJ ablation and pacemaker placement if he has recurrence.  However, now with pulsed field ablation, I do not think the risk of AF ablation is significantly higher than that of AVJ ablation and pacemaker placement.  Due to his age, he is of higher risk than normal for any type of intervention, however. We discussed options for management at length.  I explained that atrial fibrillation ablation does carry risk, and he is a high risk than normal due to his age.  However, his quality of life is currently so low he is eager to proceed. Plan for cardioversion, follow-up to reassess prior to ablation  We discussed the indication, rationale, logistics, anticipated benefits, and potential risks of the ablation procedure including but not limited to -- bleed at the groin access site, chest pain, damage to nearby organs such as the diaphragm, lungs, or esophagus, need for a drainage tube, or prolonged hospitalization. I explained that the risk for stroke, heart attack, need for open chest surgery, or even death is very low but not zero. he  expressed understanding and wishes to proceed.    Monitoring of high risk medication Recent had TSH - ok PFTs showed severely reduced DLCO (47%).  I found a prior pulmonary function test from 2019 that showed a DLCO of 50%.  He has been maintained on  amiodarone since then with very minimal change in his DLCO. I had a long discussion with the patient and his wife about the seriousness of pulmonary fibrosis and amiodarone --that it can be debilitating and life threatening disease. He also feels that his quality of life will deteriorate severely with atrial fibrillation and would prefer to stay on the medication with ongoing monitoring. Given the patient's advanced age, minimal change in DLCO over 6 years, and paucity of symptoms, I think it reasonable to continue amiodarone for now.  Severe pulmonary hypertension May need to consider as possible contributor to edema. Pt has follow-up in Cardiology clinic  Mitral valve regurgitation Moderate on most recent echo, 03/15/2022  CKD stage IIIb Most recent Cr 2.99 - limits AAD options  Obstructive sleep apnea Adherent to CPAP   I reviewed note  by Bernadene Person, prior PFT results        Medication Adjustments/Labs and Tests Ordered: Current medicines are reviewed at length with the patient today.  Concerns regarding medicines are outlined above.  Orders Placed This Encounter  Procedures   EKG 12-Lead   No orders of the defined types were placed in this encounter.    Signed, Maurice Small, MD  03/23/2023 11:00 AM    [facility]

## 2023-03-26 ENCOUNTER — Ambulatory Visit: Payer: Medicare Other | Attending: Cardiovascular Disease | Admitting: Cardiovascular Disease

## 2023-03-26 ENCOUNTER — Encounter: Payer: Self-pay | Admitting: Cardiovascular Disease

## 2023-03-26 DIAGNOSIS — I4891 Unspecified atrial fibrillation: Secondary | ICD-10-CM

## 2023-03-26 DIAGNOSIS — I4819 Other persistent atrial fibrillation: Secondary | ICD-10-CM | POA: Diagnosis not present

## 2023-03-26 DIAGNOSIS — E039 Hypothyroidism, unspecified: Secondary | ICD-10-CM

## 2023-03-26 DIAGNOSIS — I10 Essential (primary) hypertension: Secondary | ICD-10-CM

## 2023-03-26 DIAGNOSIS — G4733 Obstructive sleep apnea (adult) (pediatric): Secondary | ICD-10-CM

## 2023-03-26 NOTE — Progress Notes (Signed)
Cardiology Office Note    Date:  04/02/2023   ID:  Charles [name], Charles [name] Charles [name], MRN Charles [name]  PCP:  Jackelyn Poling, DO  Cardiologist:  Nicki Guadalajara, MD (sleep); Dr. Liliane Bade EP: York Pellant, MD  New sleep consultation   History of Present Illness:  Charles [name] is a 82 y.o. male who is followed by Dr. Olga Millers for cardiology care and remotely had seen Dr. [name] of electrophysiology with his history of atrial fibrillation, status post multiple DC cardioversions and subsequent mapping and ablation in June 2014.  He had developed recurrent atrial fibrillation this past year and has undergone cardioversion on October 23, 2022 with restoration of sinus rhythm.  He is now followed by Dr. York Pellant of EP and last saw him on December 21, 2022 at which time he was maintaining sinus rhythm on amiodarone.  An echo Doppler study on November 30, 2022 showed an EF of 55 to 60% without wall motion abnormalities.  He had normal diastolic parameters.  Estimated RV systolic pressure was increased at 45.5.  He had severe left atrial dilatation with mild right atrial dilatation.  There was mild to moderate MR and moderate to severe TR.  He had aortic sclerosis without stenosis.  He developed recurrent atrial fibrillation in August 2024 associated with significant fatigability and was reevaluated by Dr. Nelly Laurence.  During that evaluation he was restarted on amiodarone and further discussion regarding future ablation tentatively scheduled for Charles 2025 was discussed.  Charles [name] has a history of obstructive sleep apnea and in 2014 had undergone a sleep study by Dr. Marcelyn Bruins.  I was able to get data regarding that Nov 14, 2012 study which showed severe sleep apnea with an overall AHI of 44.8 on the diagnostic evaluation with more severe sleep apnea with supine sleep AHI at 58.5/h.  CPAP was initiated and was titrated to 11 cm water pressure.    He brought his current CPAP machine with him to  the office today.  It appears that he received this machine in 2017.  I was able to interrogate his machine which showed over the last month, average use was 9.8 hours per night.  Average CPAP was 11.7 cm and AHI 4.7.  An Epworth sleepiness scale score was calculated in the office today and this endorsed at 11 suggestive of residual daytime sleepiness as shown below:   Epworth Sleepiness Scale: Situation   Chance of Dozing/Sleeping (0 = never , 1 = slight chance , 2 = moderate chance , 3 = high chance )   sitting and reading 3   watching TV 2   sitting inactive in a public place 0   being a passenger in a motor vehicle for an hour or more 0   lying down in the afternoon 3   sitting and talking to someone 0   sitting quietly after lunch (no alcohol) 3   while stopped for a few minutes in traffic as the driver 0   Total Score  11    He is unaware of breakthrough snoring.  He denies bruxism, awareness of restless legs, Hyp no cognitive or hypnopompic hallucinations or cataplectic events.  He presents for sleep consultation and further evaluation.   Past Medical History:  Diagnosis Date   Arthritis    Atrial dilatation    mild biatrial dilitation   Bruises easily    CHF (congestive heart failure) (HCC)    CKD (chronic kidney disease),  stage III (HCC)    Coronary artery disease    Dysrhythmia    atrial fibrillation   GERD (gastroesophageal reflux disease)    H/O hiatal hernia    Hypercholesterolemia    Hyperglycemia    Hypertension    Hypothyroidism    Mitral regurgitation    a. Mild by echo in 01/2014.   Nonischemic cardiomyopathy (HCC)    a. presumed to be tachycardia mediated. Varying EFs over the years from 35-40% to normal and back.   Paroxysmal atrial fibrillation (HCC)    a. H/o difficult to control. b. s/p PVI ablation 12-03-2012 by [name] with recurrence afterwards, requiring period of amiodarone.   Peripheral edema    Sleep apnea    USES C PAP   Tear of medial  meniscus of left knee    Tremor     Past Surgical History:  Procedure Laterality Date   ATRIAL FIBRILLATION ABLATION N/A 12/03/2012   Procedure: ATRIAL FIBRILLATION ABLATION;  Surgeon: Hillis Range, MD;  Location: [facility] CATH LAB;  Service: Cardiovascular;  Laterality: N/A;   ATRIAL FIBRILLATION ABLATION     PVI by [name]   CARDIAC CATHETERIZATION     CARDIOVERSION N/A 09/04/2012   Procedure: CARDIOVERSION;  Surgeon: [name], MD;  Location: [facility] ENDOSCOPY;  Service: Cardiovascular;  Laterality: N/A;   CARDIOVERSION N/A 02/11/2013   Procedure: CARDIOVERSION;  Surgeon: [name], MD;  Location: [facility] ENDOSCOPY;  Service: Cardiovascular;  Laterality: N/A;   CARDIOVERSION N/A 12/07/2015   Procedure: CARDIOVERSION;  Surgeon: Chrystie Nose, MD;  Location: [facility] ENDOSCOPY;  Service: Cardiovascular;  Laterality: N/A;   CARDIOVERSION N/A 03/11/2018   Procedure: CARDIOVERSION;  Surgeon: Lars Masson, MD;  Location: [facility] ENDOSCOPY;  Service: Cardiovascular;  Laterality: N/A;   CARDIOVERSION N/A 09/23/2020   Procedure: CARDIOVERSION;  Surgeon: [name], MD;  Location: [facility] ENDOSCOPY;  Service: Cardiovascular;  Laterality: N/A;   CARDIOVERSION N/A 08/19/2021   Procedure: CARDIOVERSION;  Surgeon: Jake Bathe, MD;  Location: [facility] ENDOSCOPY;  Service: Cardiovascular;  Laterality: N/A;   CARDIOVERSION N/A 10/23/2022   Procedure: CARDIOVERSION;  Surgeon: Meriam Sprague, MD;  Location: [facility] INVASIVE CV LAB;  Service: Cardiovascular;  Laterality: N/A;   CATARACT EXTRACTION, BILATERAL     EYE SURGERY  2015,2016   bilateral cataract surgery with lens implants   HERNIA REPAIR  03/2005   JOINT REPLACEMENT  2005   total right hip replacement   kidney stent  1987, 02/2005   KIDNEY STONE SURGERY     2 stents placed   KNEE ARTHROSCOPY WITH DRILLING/MICROFRACTURE Left 11/20/2013   Procedure: KNEE ARTHROSCOPY WITH DRILLING/MICROFRACTURE;  Surgeon: Jacki Cones, MD;  Location: [facility] ORS;  Service:  Orthopedics;  Laterality: Left;   KNEE ARTHROSCOPY WITH MEDIAL MENISECTOMY Left 11/20/2013   Procedure: LEFT KNEE ARTHROSCOPY WITH MEDIAL MENISECTOMY;  Surgeon: Jacki Cones, MD;  Location: [facility] ORS;  Service: Orthopedics;  Laterality: Left;  Microfracture of medial femoral chondyle and abrasion chondroplasty of the medial femoral chondyle   LITHOTRIPSY     x 2   PICC line placement  2007   quadrec  1999   LFT leg quad sx   right hip surgery  2007   irrigation and debridement from bacteria   TEE WITHOUT CARDIOVERSION N/A 12/02/2012   Procedure: TRANSESOPHAGEAL ECHOCARDIOGRAM (TEE);  Surgeon: [name], MD;  Location: [facility] ENDOSCOPY;  Service: Cardiovascular;  Laterality: N/A;   TOTAL HIP ARTHROPLASTY Left 08/12/2015   Procedure: LEFT TOTAL HIP ARTHROPLASTY;  Surgeon: Ranee Gosselin, MD;  Location: [facility] ORS;  Service: Orthopedics;  Laterality: Left;    Current Medications: Outpatient Medications Prior to Visit  Medication Sig Dispense Refill   acetaminophen (TYLENOL) 650 MG CR tablet Take 650 mg by mouth every evening.     amiodarone (PACERONE) 200 MG tablet TAKE 1 TABLET BY MOUTH DAILY 90 tablet 3   amLODipine (NORVASC) 10 MG tablet TAKE 1 TABLET BY MOUTH DAILY 90 tablet 3   Ascorbic Acid (VITAMIN C PO) Take 1 tablet by mouth 2 (two) times daily.     Cholecalciferol (VITAMIN D) 125 MCG (5000 UT) CAPS Take 5,000 Units by mouth in the morning.     Coenzyme Q10 (COQ10) 100 MG CAPS Take 100 mg by mouth every morning.     edoxaban ([name]) 60 MG TABS tablet Take 30 mg by mouth daily. (Patient taking differently: Take 30 mg by mouth every evening.) 90 tablet 3   enalapril (VASOTEC) 20 MG tablet TAKE 1/2 TABLET BY MOUTH EVERY NIGHT AT BEDTIME 45 tablet 6   EQ MAGNESIUM CITRATE PO Take 420 mg by mouth daily at 12 noon.     IRON PO Take 65 mg by mouth daily at 12 noon.     levothyroxine (SYNTHROID) 125 MCG tablet TAKE 1 TABLET BY MOUTH DAILY BEFORE BREAKFAST 90 tablet 1   Misc Natural Products  (OSTEO BI-FLEX JOINT SHIELD PO) Take 2 tablets by mouth every morning.     Multiple Vitamin (MULTIVITAMIN) tablet Take 1 tablet by mouth daily at 12 noon.     Omega-3 Fatty Acids (FISH OIL) 1200 MG CAPS Take 1,200 mg by mouth 2 (two) times daily.     omeprazole (PRILOSEC) 20 MG capsule Take 20 mg by mouth every Monday, Wednesday, and Friday. In the evening     polyvinyl alcohol (LIQUIFILM TEARS) 1.4 % ophthalmic solution Place 1 drop into both eyes 3 (three) times daily as needed for dry eyes.     torsemide (DEMADEX) 20 MG tablet Take 20 mg by mouth in the morning.     zinc gluconate 50 MG tablet Take 50 mg by mouth daily at 12 noon.     No facility-administered medications prior to visit.     Allergies:   Ceftriaxone sodium, Clindamycin/lincomycin, Diltiazem hcl, and Penicillins   Social History   Socioeconomic History   Marital status: Married    Spouse name: Kathie Rhodes   Number of children: 2   Years of education: Not on file   Highest education level: Some college, no degree  Occupational History   Occupation: Recruitment consultant: RETIRED  Tobacco Use   Smoking status: Never   Smokeless tobacco: Never  Vaping Use   Vaping status: Never Used  Substance and Sexual Activity   Alcohol use: No   Drug use: No   Sexual activity: Not on file  Other Topics Concern   Not on file  Social History Narrative   Lives in [place] with spouse.  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   [employer]:   Has worked extensively in [employer]. Significant asbestos asbestos exposure. No bird or mold exposure.       Right handed   Social Determinants of Health   Financial Resource Strain: Not on file  Food Insecurity: Not on file  Transportation Needs: Not on file  Physical Activity: Not on file  Stress: Not on file  Social Connections:  Not on file    Social history is notable and that he was born in [place].   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  He is married for 63 years has 2 children, 4 grandchildren, and 29 great-grandchildren.  Family History:  The patient's family history includes Cancer (age of onset: 37) in his mother; Heart disease (age of onset: 26) in his father; Lupus in his brother.  His father died secondary to an MI.  His mother died secondary to ovarian cancer  ROS General: Negative; No fevers, chills, or night sweats;  HEENT: Negative; No changes in vision or hearing, sinus congestion, difficulty swallowing Pulmonary: Negative; No cough, wheezing, shortness of breath, hemoptysis Cardiovascular: See HPI, history of at least 9 prior DC cardioversions for recurrent atrial fibrillation.  Initial ablation by Dr. [name] in 2014 with planned new ablation procedure in 2025 GI: Negative; No nausea, vomiting, diarrhea, or abdominal pain GU: Negative; No dysuria, hematuria, or difficulty voiding Musculoskeletal: Negative; no myalgias, joint pain, or weakness Hematologic/Oncology: Negative; no easy bruising, bleeding Endocrine: Positive for hypothyroidism Neuro: Negative; no changes in balance, headaches Skin: Negative; No rashes or skin lesions Psychiatric: Negative; No behavioral problems, depression Sleep: Negative; No snoring, daytime sleepiness, hypersomnolence, bruxism, restless legs, hypnogognic hallucinations, no cataplexy Other comprehensive 14 point system review is negative.   PHYSICAL EXAM:   VS:  BP (!) 104/58   Pulse (!) 110   Ht 5\' 9"  (1.753 m)   Wt 163 lb 6.4 oz (74.1 kg)   SpO2 92%   BMI 24.13 kg/m     Repeat blood pressure by me was 98/60  Wt Readings from Last 3 Encounters:  03/26/23 163 lb 6.4 oz (74.1 kg)  03/23/23 163 lb 3.2 oz (74 kg)  12/21/22 171 lb 12.8 oz (77.9 kg)    General: Alert, oriented, no distress.  Skin: normal turgor, no rashes, warm and dry HEENT: Normocephalic, atraumatic. Pupils equal round and reactive to light;  sclera anicteric; extraocular muscles intact;  Nose without nasal septal hypertrophy Mouth/Parynx benign; Mallinpatti scale 3 Neck: No JVD, no carotid bruits; normal carotid upstroke Lungs: clear to ausculatation and percussion; no wheezing or rales Chest wall: without tenderness to palpitation Heart: PMI not displaced, irregularly irregular consistent with atrial fibrillation at 110 bpm, s1 s2 normal, 1/6 systolic murmur, no diastolic murmur, no rubs, gallops, thrills, or heaves Abdomen: soft, nontender; no hepatosplenomehaly, BS+; abdominal aorta nontender and not dilated by palpation. Back: no CVA tenderness Pulses 2+ Musculoskeletal: full range of motion, normal strength, no joint deformities Extremities: no clubbing cyanosis or edema, Homan's sign negative  Neurologic: grossly nonfocal; Cranial nerves grossly wnl Psychologic: Normal mood and affect   Studies/Labs Reviewed:   EKG Interpretation Date/Time:  Monday March 26 2023 10:16:23 EDT Ventricular Rate:  110 PR Interval:    QRS Duration:  98 QT Interval:  304 QTC Calculation: 411 R Axis:   53  Text Interpretation: Atrial fibrillation with rapid ventricular response with premature ventricular or aberrantly conducted complexes ST & T wave abnormality, consider inferior ischemia ST & T wave abnormality, consider anterolateral ischemia When compared with ECG of 23-Mar-2023 10:42, No significant change was found Confirmed by Nicki Guadalajara ([id number]) on 03/26/2023 11:39:55 AM    Recent Labs:    Latest Ref Rng & Units 03/23/2023   11:42 AM 11/03/2022   10:41 AM 10/27/2022    4:39 PM  BMP  Glucose 70 - 99 mg/dL 756  433  295  BUN 8 - 27 mg/dL 75  44  68   Creatinine 0.76 - 1.27 mg/dL 7.82  9.56  2.13   BUN/Creat Ratio 10 - 24 25  20  23    Sodium 134 - 144 mmol/L 140  142  140   Potassium 3.5 - 5.2 mmol/L 4.8  4.6  4.4   Chloride 96 - 106 mmol/L 102  107  104   CO2 20 - 29 mmol/L 24  20  19    Calcium 8.6 - 10.2 mg/dL 08.6  9.6   9.5         Latest Ref Rng & Units 10/27/2022    4:39 PM 09/09/2021   10:29 AM 03/15/2020    9:11 AM  Hepatic Function  Total Protein 6.0 - 8.5 g/dL 6.6  6.9  7.3   Albumin 3.7 - 4.7 g/dL 3.9  4.6    AST 0 - 40 IU/L 27  21  22    ALT 0 - 44 IU/L 33  17  17   Alk Phosphatase 44 - 121 IU/L 79  72    Total Bilirubin 0.0 - 1.2 mg/dL 0.4  0.3  0.5   Bilirubin, Direct 0.00 - 0.40 mg/dL  5.78         Latest Ref Rng & Units 03/23/2023   11:42 AM 10/27/2022    4:39 PM 10/19/2022    9:13 AM  CBC  WBC 3.4 - 10.8 x10E3/uL 10.6  10.9  9.7   Hemoglobin 13.0 - 17.7 g/dL 46.9  9.4  62.9   Hematocrit 37.5 - 51.0 % 33.2  27.9  31.8   Platelets 150 - 450 x10E3/uL 346  256  252    Lab Results  Component Value Date   MCV 95 03/23/2023   MCV 93 10/27/2022   MCV 97 10/19/2022   Lab Results  Component Value Date   TSH 1.600 10/27/2022   No results found for: "HGBA1C"   BNP    Component Value Date/Time   BNP 325.7 (H) 10/27/2022 1639    ProBNP No results found for: "PROBNP"   Lipid Panel     Component Value Date/Time   CHOL 187 03/15/2020 0911   TRIG 208 (H) 03/15/2020 0911   HDL 41 03/15/2020 0911   CHOLHDL 4.6 03/15/2020 0911   VLDL 50 (H) 05/05/2015 1023   LDLCALC 114 (H) 03/15/2020 0911   LDLDIRECT 95.3 10/24/2011 1022     RADIOLOGY: No results found.   Additional studies/ records that were reviewed today include:   I reviewed the sleep study from Nov 14, 2012.  Extensive cardiology records were reviewed.  I was able to interrogate his old CPAP machine.    ASSESSMENT:    1. OSA (obstructive sleep apnea)   2. Persistent atrial fibrillation (HCC)   3. Essential hypertension   4. Hypothyroidism, unspecified type     PLAN:  Charles [name] is a 82 year old gentleman who has a history of recurrent atrial fibrillation commencing in 2012 and over the past 12 years has had approximately 9 cardioversions.  In 2014, he underwent initial atrial fibrillation  ablation by Dr. [name].  He had undergone recurrent cardioversions in January 2023 in April 2024.  He has subsequently developed recurrent atrial fibrillation, is on amiodarone, and tentative plans are for consideration of repeat ablation procedure in Charles 2025 with Dr. Nelly Laurence.  He has been documented to have obstructive sleep apnea since his study in 2022 interpreted by Dr. Marcelyn Bruins.  I was  able to obtain the sleep study from 02/15/2023.  Sleep apnea was severe with AHI 44.8/h and with supine sleep was 58.5/h.  During that sleep evaluation ration he was titrated to 11 cm water pressure.  Apparently, Mr. Vonbergen has been on CPAP therapy since 2014.  He received a new machine in 2017.  He has not seen a sleep physician since his initial evaluation 10 years ago.  He brought his machine to the office today.  I was able to interrogate this.  He has been compliant with therapy and over the last month average 9.8 hours per night.  His CPAP is set at 11.7 cm pressure and AHI was 4.7 with central apnea events at 1.1.  His machine had been set at a range of 5 to 12 cm.  On exam today, he is in atrial fibrillation with increased ventricular rate.  He is on amiodarone 20 mg daily his blood pressure today is stable and initially was 104/58 and slightly lower at 98/60 when checked by me on his regimen of amlodipine 10 mg, enalapril 20 mg, and torsemide 20 mg.  He also is on levothyroxine for hypothyroidism.  I had a lengthy discussion with him today.  I discussed with him at length potential adverse consequences of sleep apnea particularly with reference to his cardiovascular health.  He has had recurrent issues with atrial fibrillation.  He has a history of hypertension on current therapy.  I also discussed increased risk for recurrent AF which has has been a persistent problem in his medical history.  I also discussed potential negative effects on insulin resistance, increased inflammatory markers, increased incidence of  nocturnal GERD, and potential for nocturnal hypoxemia contributing to nocturnal ischemia.  He qualifies for a new machine.  He is compliant.  He has been using a fullface mask.  Based on his insurance we will set him up with [employer] as his DME company and schedule him to receive a new ResMed AirSense 11 AutoSet unit with initial EPR of 3 and setting of 7 to 16 cm of water.  I will need to see him within 3 months of him receiving his new machine for compliance and follow-up evaluation.   Medication Adjustments/Labs and Tests Ordered: Current medicines are reviewed at length with the patient today.  Concerns regarding medicines are outlined above.  Medication changes, Labs and Tests ordered today are listed in the Patient Instructions below. Patient Instructions  Medication Instructions:  No medication changes *If you need a refill on your cardiac medications before your next appointment, please call your pharmacy*   Lab Work: None If you have labs (blood work) drawn today and your tests are completely normal, you will receive your results only by: MyChart Message (if you have MyChart) OR A paper copy in the mail If you have any lab test that is abnormal or we need to change your treatment, we will call you to review the results.   Follow-Up: At [facility], you and your health needs are our priority.  As part of our continuing mission to provide you with exceptional heart care, we have created designated Provider Care Teams.  These Care Teams include your primary Cardiologist (physician) and Advanced Practice Providers (APPs -  Physician Assistants and Nurse Practitioners) who all work together to provide you with the care you need, when you need it.  Your next appointment:   90 day(s) for C-pap compliance  Provider:   Dr. Nicki Guadalajara  Signed, Nicki Guadalajara, MD  04/02/2023 10:17 AM    [facility] [address], [address], [place], [place]   [address] Phone: [phone]

## 2023-03-26 NOTE — Patient Instructions (Addendum)
Medication Instructions:  No medication changes *If you need a refill on your cardiac medications before your next appointment, please call your pharmacy*   Lab Work: None If you have labs (blood work) drawn today and your tests are completely normal, you will receive your results only by: MyChart Message (if you have MyChart) OR A paper copy in the mail If you have any lab test that is abnormal or we need to change your treatment, we will call you to review the results.   Follow-Up: At [facility], you and your health needs are our priority.  As part of our continuing mission to provide you with exceptional heart care, we have created designated Provider Care Teams.  These Care Teams include your primary Cardiologist (physician) and Advanced Practice Providers (APPs -  Physician Assistants and Nurse Practitioners) who all work together to provide you with the care you need, when you need it.  Your next appointment:   90 day(s) for C-pap compliance  Provider:   Dr. Nicki Guadalajara

## 2023-04-02 ENCOUNTER — Encounter: Payer: Self-pay | Admitting: Cardiovascular Disease

## 2023-04-02 NOTE — Progress Notes (Signed)
Spoke to patient and instructed them to come at 07:45  and to be NPO after 0000.    Confirmed that patient will have a ride home and someone to stay with them for 24 hours after the procedure.

## 2023-04-03 ENCOUNTER — Encounter (HOSPITAL_COMMUNITY): Payer: Self-pay | Admitting: Cardiology

## 2023-04-03 ENCOUNTER — Ambulatory Visit (HOSPITAL_COMMUNITY): Payer: Medicare Other | Admitting: Anesthesiology

## 2023-04-03 ENCOUNTER — Ambulatory Visit (HOSPITAL_COMMUNITY)
Admission: RE | Admit: 2023-04-03 | Discharge: 2023-04-03 | Disposition: A | Discharge status: Home / Self Care | Payer: Medicare Other | Attending: Cardiology | Admitting: Cardiology

## 2023-04-03 ENCOUNTER — Other Ambulatory Visit: Payer: Self-pay

## 2023-04-03 ENCOUNTER — Encounter (HOSPITAL_COMMUNITY)
Admission: RE | Disposition: A | Discharge status: Home / Self Care | Payer: Self-pay | Source: Home / Self Care | Attending: Cardiology

## 2023-04-03 DIAGNOSIS — E039 Hypothyroidism, unspecified: Secondary | ICD-10-CM | POA: Diagnosis not present

## 2023-04-03 DIAGNOSIS — I429 Cardiomyopathy, unspecified: Secondary | ICD-10-CM | POA: Insufficient documentation

## 2023-04-03 DIAGNOSIS — I4819 Other persistent atrial fibrillation: Secondary | ICD-10-CM | POA: Insufficient documentation

## 2023-04-03 DIAGNOSIS — I129 Hypertensive chronic kidney disease with stage 1 through stage 4 chronic kidney disease, or unspecified chronic kidney disease: Secondary | ICD-10-CM | POA: Diagnosis not present

## 2023-04-03 DIAGNOSIS — N1832 Chronic kidney disease, stage 3b: Secondary | ICD-10-CM | POA: Insufficient documentation

## 2023-04-03 DIAGNOSIS — Z79899 Other long term (current) drug therapy: Secondary | ICD-10-CM | POA: Insufficient documentation

## 2023-04-03 DIAGNOSIS — I340 Nonrheumatic mitral (valve) insufficiency: Secondary | ICD-10-CM | POA: Insufficient documentation

## 2023-04-03 DIAGNOSIS — G4730 Sleep apnea, unspecified: Secondary | ICD-10-CM | POA: Diagnosis not present

## 2023-04-03 DIAGNOSIS — G4733 Obstructive sleep apnea (adult) (pediatric): Secondary | ICD-10-CM | POA: Diagnosis not present

## 2023-04-03 DIAGNOSIS — I4891 Unspecified atrial fibrillation: Secondary | ICD-10-CM

## 2023-04-03 DIAGNOSIS — I272 Pulmonary hypertension, unspecified: Secondary | ICD-10-CM | POA: Insufficient documentation

## 2023-04-03 DIAGNOSIS — Z01818 Encounter for other preprocedural examination: Secondary | ICD-10-CM

## 2023-04-03 HISTORY — PX: CARDIOVERSION: SHX1299

## 2023-04-03 SURGERY — CARDIOVERSION
Anesthesia: General

## 2023-04-03 MED ORDER — PROPOFOL 10 MG/ML IV BOLUS
INTRAVENOUS | Status: DC | PRN
  Administered 2023-04-03: 50 mg via INTRAVENOUS

## 2023-04-03 MED ORDER — SODIUM CHLORIDE 0.9 % IV SOLN
INTRAVENOUS | Status: DC
  Administered 2023-04-03: 20 mL/h via INTRAVENOUS

## 2023-04-03 MED ORDER — LIDOCAINE 2% (20 MG/ML) 5 ML SYRINGE
INTRAMUSCULAR | Status: DC | PRN
  Administered 2023-04-03: 60 mg via INTRAVENOUS

## 2023-04-03 SURGICAL SUPPLY — 1 items: PAD DEFIB RADIO PHYSIO CONN (PAD) ×1 IMPLANT

## 2023-04-03 NOTE — CV Procedure (Signed)
Procedure:   DCCV  Indication:  Symptomatic atrial fibrillation  Procedure Note:  The patient signed informed consent.  They have had had therapeutic anticoagulation with edoxaban greater than 3 weeks.  Anesthesia was administered by Dr. Desmond Lope.  Patient received 60 mg IV lidocaine and 50 mg IV propofol.Adequate airway was maintained throughout and vital followed per protocol.  They were cardioverted x 1 with 200J.  They converted to NSR with frequent PACs and PVCs. There were brief runs of afib that returned to sinus after several seconds.  There were no apparent complications.  The patient had normal neuro status and respiratory status post procedure with vitals stable as recorded elsewhere.    Follow up:  They will continue on current medical therapy and follow up with cardiology as scheduled.  Jodelle Red, MD PhD 04/03/2023 9:26 AM

## 2023-04-03 NOTE — Anesthesia Preprocedure Evaluation (Addendum)
Anesthesia Evaluation  Patient identified by MRN, date of birth, ID band Patient awake    Reviewed: Allergy & Precautions, NPO status , Patient's Chart, lab work & pertinent test results  Airway Mallampati: II  TM Distance: >3 FB Neck ROM: Full    Dental  (+) Dental Advisory Given, Partial Upper, Caps   Pulmonary sleep apnea and Continuous Positive Airway Pressure Ventilation    Pulmonary exam normal breath sounds clear to auscultation       Cardiovascular hypertension, + CAD, +CHF and + Orthopnea  + dysrhythmias Atrial Fibrillation + Valvular Problems/Murmurs MR  Rhythm:Irregular Rate:Abnormal     Neuro/Psych negative neurological ROS     GI/Hepatic Neg liver ROS, hiatal hernia,GERD  ,,  Endo/Other  Hypothyroidism    Renal/GU Renal InsufficiencyRenal disease     Musculoskeletal  (+) Arthritis ,    Abdominal   Peds  Hematology  (+) Blood dyscrasia, anemia   Anesthesia Other Findings Day of surgery medications reviewed with the patient.  Reproductive/Obstetrics                             Anesthesia Physical Anesthesia Plan  ASA: 3  Anesthesia Plan: General   Post-op Pain Management: Minimal or no pain anticipated   Induction: Intravenous  PONV Risk Score and Plan: 1 and TIVA and Treatment may vary due to age or medical condition  Airway Management Planned: Mask  Additional Equipment:   Intra-op Plan:   Post-operative Plan:   Informed Consent: I have reviewed the patients History and Physical, chart, labs and discussed the procedure including the risks, benefits and alternatives for the proposed anesthesia with the patient or authorized representative who has indicated his/her understanding and acceptance.     Dental advisory given  Plan Discussed with: CRNA  Anesthesia Plan Comments:        Anesthesia Quick Evaluation

## 2023-04-03 NOTE — Interval H&P Note (Signed)
History and Physical Interval Note:  04/03/2023 8:03 AM  Charles [name]  has presented today for surgery, with the diagnosis of AFIB.  The various methods of treatment have been discussed with the patient and family. After consideration of risks, benefits and other options for treatment, the patient has consented to  Procedure(s): CARDIOVERSION (N/A) as a surgical intervention.  The patient's history has been reviewed, patient examined, no change in status, stable for surgery.  I have reviewed the patient's chart and labs.  Questions were answered to the patient's satisfaction.     [name]

## 2023-04-03 NOTE — Discharge Instructions (Signed)
 Electrical Cardioversion Electrical cardioversion is the delivery of a jolt of electricity to restore a normal rhythm to the heart. A rhythm that is too fast or is not regular (arrhythmia) keeps the heart from pumping blood well. There is also another type of cardioversion called a chemical (pharmacologic) cardioversion. This is when your health care provider gives you one or more medicines to bring back your regular heart rhythm. Electrical cardioversion is done as a scheduled procedure for arrhythmiasthat are not life-threatening. Electrical cardioversion may also be done in an emergency for sudden life-threatening arrhythmias. Tell a health care provider about: Any allergies you have. All medicines you are taking, including vitamins, herbs, eye drops, creams, and over-the-counter medicines. Any problems you or family members have had with sedatives or anesthesia. Any bleeding problems you have. Any surgeries you have had, including a pacemaker, defibrillator, or other implanted device. Any medical conditions you have. Whether you are pregnant or may be pregnant. What are the risks? Your provider will talk with you about risks. These include: Allergic reactions to medicines. Irritation to the skin on your chest or back where the sticky pads (electrodes) or paddles were put during electrical cardioversion. A blood clot that breaks free and travels to other parts of your body, such as your brain. Return of a worse abnormal heart rhythm that will need to be treated with medicines, a pacemaker, or an implantable cardioverter defibrillator (ICD). What happens before the procedure? Medicines Your provider may give you: Blood-thinning medicines (anticoagulants) so your blood does not clot as easily. If your provider gives you this medicine, you may need to take it for 4 weeks before the procedure. Medicines to help stabilize your heart rate and rhythm. Ask your provider about: Changing or stopping  your regular medicines. These include any diabetes medicines or blood thinners you take. Taking medicines such as aspirin and ibuprofen. These medicines can thin your blood. Do not take them unless your provider tells you to. Taking over-the-counter medicines, vitamins, herbs, and supplements. General instructions Follow instructions from your provider about what you may eat and drink. Do not put any lotions, powders, or ointments on your chest and back for 24 hours before the procedure. They can cause problems with the electrodes or paddles used to deliver electricity to your heart. Do not wear jewelry as this can interfere with delivering electricity to your heart. If you will be going home right after the procedure, plan to have a responsible adult: Take you home from the hospital or clinic. You will not be allowed to drive. Care for you for the time you are told. Tests You may have an exam or testing. This may include: Blood labs. A transesophageal echocardiogram (TEE). What happens during the procedure?     An IV will be inserted into one of your veins. You will be given a sedative. This helps you relax. Electrodes or metal paddles will be placed on your chest. They may be placed in one of these ways: One placed on your right chest, the other on the left ribs. One placed on your chest and the other on your back. An electrical shock will be delivered. The shock briefly stops (resets) your heart rhythm. Your provider will check to see if your heart rhythm is now normal. Some people need only one shock. Some need more to restore a normal heart rhythm. The procedure may vary among providers and hospitals. What happens after the procedure? Your blood pressure, heart rate, breathing rate, and blood oxygen  level will be monitored until you leave the hospital or clinic. Your heart rhythm will be watched to make sure it does not change. This information is not intended to replace advice  given to you by your health care provider. Make sure you discuss any questions you have with your health care provider. Document Revised: 02/02/2022 Document Reviewed: 02/02/2022 Elsevier Patient Education  2024 [employer].

## 2023-04-03 NOTE — Transfer of Care (Signed)
Immediate Anesthesia Transfer of Care Note  Patient: Charles [name]  Procedure(s) Performed: CARDIOVERSION  Patient Location: PACU and Cath Lab  Anesthesia Type:MAC  Level of Consciousness: awake  Airway & Oxygen Therapy: Patient Spontanous Breathing and Patient connected to nasal cannula oxygen  Post-op Assessment: Report given to RN and Post -op Vital signs reviewed and stable  Post vital signs: Reviewed and stable  Last Vitals:  Vitals Value Taken Time  BP    Temp    Pulse 116 04/03/23 0912  Resp 19 04/03/23 0912  SpO2 100 % 04/03/23 0912  Vitals shown include unfiled device data.  Last Pain: There were no vitals filed for this visit.       Complications: No notable events documented.

## 2023-04-03 NOTE — Anesthesia Postprocedure Evaluation (Signed)
Anesthesia Post Note  Patient: CLEAVON GOLDMAN  Procedure(s) Performed: CARDIOVERSION     Patient location during evaluation: Cath Lab Anesthesia Type: General Level of consciousness: awake and alert Pain management: pain level controlled Vital Signs Assessment: post-procedure vital signs reviewed and stable Respiratory status: spontaneous breathing, nonlabored ventilation, respiratory function stable and patient connected to nasal cannula oxygen Cardiovascular status: blood pressure returned to baseline and stable Postop Assessment: no apparent nausea or vomiting Anesthetic complications: no   No notable events documented.  Last Vitals:  Vitals:   04/03/23 0945 04/03/23 0950  BP: 124/70 120/62  Pulse: 79 78  Resp: 19 16  Temp:    SpO2: 100% 100%    Last Pain:  Vitals:   04/03/23 0940  TempSrc:   PainSc: 0-No pain                 Collene Schlichter

## 2023-04-04 ENCOUNTER — Encounter: Payer: Self-pay | Admitting: Cardiovascular Disease

## 2023-04-04 ENCOUNTER — Encounter (HOSPITAL_COMMUNITY): Payer: Self-pay | Admitting: Cardiology

## 2023-04-05 ENCOUNTER — Ambulatory Visit (HOSPITAL_COMMUNITY)
Admission: RE | Admit: 2023-04-05 | Discharge: 2023-04-05 | Disposition: A | Discharge status: Home / Self Care | Payer: Medicare Other | Source: Ambulatory Visit | Attending: Physician Assistant | Admitting: Physician Assistant

## 2023-04-05 ENCOUNTER — Encounter (HOSPITAL_COMMUNITY): Payer: Self-pay | Admitting: Physician Assistant

## 2023-04-05 VITALS — BP 130/80 | HR 122 | Ht 69.0 in | Wt 163.8 lb

## 2023-04-05 DIAGNOSIS — N183 Chronic kidney disease, stage 3 unspecified: Secondary | ICD-10-CM | POA: Diagnosis not present

## 2023-04-05 DIAGNOSIS — I129 Hypertensive chronic kidney disease with stage 1 through stage 4 chronic kidney disease, or unspecified chronic kidney disease: Secondary | ICD-10-CM | POA: Diagnosis not present

## 2023-04-05 DIAGNOSIS — Z79899 Other long term (current) drug therapy: Secondary | ICD-10-CM

## 2023-04-05 DIAGNOSIS — D6869 Other thrombophilia: Secondary | ICD-10-CM | POA: Insufficient documentation

## 2023-04-05 DIAGNOSIS — Z5181 Encounter for therapeutic drug level monitoring: Secondary | ICD-10-CM

## 2023-04-05 DIAGNOSIS — N189 Chronic kidney disease, unspecified: Secondary | ICD-10-CM | POA: Diagnosis not present

## 2023-04-05 DIAGNOSIS — R9431 Abnormal electrocardiogram [ECG] [EKG]: Secondary | ICD-10-CM | POA: Insufficient documentation

## 2023-04-05 DIAGNOSIS — I509 Heart failure, unspecified: Secondary | ICD-10-CM | POA: Insufficient documentation

## 2023-04-05 DIAGNOSIS — I130 Hypertensive heart and chronic kidney disease with heart failure and stage 1 through stage 4 chronic kidney disease, or unspecified chronic kidney disease: Secondary | ICD-10-CM | POA: Insufficient documentation

## 2023-04-05 DIAGNOSIS — I4819 Other persistent atrial fibrillation: Secondary | ICD-10-CM | POA: Diagnosis not present

## 2023-04-05 DIAGNOSIS — Z7901 Long term (current) use of anticoagulants: Secondary | ICD-10-CM | POA: Insufficient documentation

## 2023-04-05 DIAGNOSIS — R000 Tachycardia, unspecified: Secondary | ICD-10-CM | POA: Diagnosis not present

## 2023-04-05 MED ORDER — METOPROLOL SUCCINATE ER 25 MG PO TB24: 25.0000 mg | ORAL_TABLET | Freq: Every day | ORAL | 0 refills | Status: DC

## 2023-04-05 NOTE — Patient Instructions (Signed)
Start Metoprolol 25mg  daily

## 2023-04-05 NOTE — Progress Notes (Signed)
Primary Care Physician: Jackelyn Poling, DO Primary Cardiologist: Olga Millers, MD Electrophysiologist: Maurice Small, MD  Referring Physician: Dr Nelly Laurence    Charles [name] is a 82 y.o. male with a history of tachycardia mediated CM, mitral valve regurgitation, CKD, HTN, hypothyroidism, OSA, atrial fibrillation who presents for follow up in the [facility]. He underwent atrial fibrillation ablation in June 2014 by Dr. [name]. He has been maintained on amiodarone. Patient is on edoxaban for a CHADS2VASC score of 3.  He was seen by Dr Nelly Laurence 03/23/23 and was back in afib. DCCV was arranged for 04/03/23 but patient only remained in SR for a few hours.   On follow up today, patient remains in afib with suboptimal rate control and with symptoms of fatigue. No bleeding issues on anticoagulation.   Today, he denies symptoms of palpitations, chest pain, shortness of breath, orthopnea, PND, lower extremity edema, dizziness, presyncope, syncope, snoring, daytime somnolence, bleeding, or neurologic sequela. The patient is tolerating medications without difficulties and is otherwise without complaint today.    Atrial Fibrillation Risk Factors:  he does have symptoms or diagnosis of sleep apnea. he is compliant with CPAP therapy. he does not have a history of rheumatic fever.   Atrial Fibrillation Management history:  Previous antiarrhythmic drugs: amiodarone  Previous cardioversions: several, most recently 10/23/22, 04/03/23 Previous ablations: 2014 Anticoagulation history: edoxaban   ROS- All systems are reviewed and negative except as per the HPI above.  Past Medical History:  Diagnosis Date   Arthritis    Atrial dilatation    mild biatrial dilitation   Bruises easily    CHF (congestive heart failure) (HCC)    CKD (chronic kidney disease), stage III (HCC)    Coronary artery disease    Dysrhythmia    atrial fibrillation   GERD (gastroesophageal reflux  disease)    H/O hiatal hernia    Hypercholesterolemia    Hyperglycemia    Hypertension    Hypothyroidism    Mitral regurgitation    a. Mild by echo in 01/2014.   Nonischemic cardiomyopathy (HCC)    a. presumed to be tachycardia mediated. Varying EFs over the years from 35-40% to normal and back.   Paroxysmal atrial fibrillation (HCC)    a. H/o difficult to control. b. s/p PVI ablation 12-03-2012 by [name] with recurrence afterwards, requiring period of amiodarone.   Peripheral edema    Sleep apnea    USES C PAP   Tear of medial meniscus of left knee    Tremor     Current Outpatient Medications  Medication Sig Dispense Refill   acetaminophen (TYLENOL) 650 MG CR tablet Take 650 mg by mouth every evening.     amiodarone (PACERONE) 200 MG tablet TAKE 1 TABLET BY MOUTH DAILY 90 tablet 3   amLODipine (NORVASC) 10 MG tablet TAKE 1 TABLET BY MOUTH DAILY 90 tablet 3   Ascorbic Acid (VITAMIN C PO) Take 1 tablet by mouth 2 (two) times daily.     Cholecalciferol (VITAMIN D) 125 MCG (5000 UT) CAPS Take 5,000 Units by mouth in the morning.     Coenzyme Q10 (COQ10) 100 MG CAPS Take 100 mg by mouth every morning.     edoxaban ([name]) 60 MG TABS tablet Take 30 mg by mouth daily. (Patient taking differently: Take 30 mg by mouth every evening.) 90 tablet 3   enalapril (VASOTEC) 20 MG tablet TAKE 1/2 TABLET BY MOUTH EVERY NIGHT AT BEDTIME 45 tablet 6  EQ MAGNESIUM CITRATE PO Take 420 mg by mouth daily at 12 noon.     IRON PO Take 65 mg by mouth daily at 12 noon.     levothyroxine (SYNTHROID) 125 MCG tablet TAKE 1 TABLET BY MOUTH DAILY BEFORE BREAKFAST 90 tablet 1   metoprolol succinate (TOPROL XL) 25 MG 24 hr tablet Take 1 tablet (25 mg total) by mouth daily. 90 tablet 0   Misc Natural Products (OSTEO BI-FLEX JOINT SHIELD PO) Take 2 tablets by mouth every morning.     Multiple Vitamin (MULTIVITAMIN) tablet Take 1 tablet by mouth daily at 12 noon.     Omega-3 Fatty Acids (FISH OIL) 1200 MG CAPS  Take 1,200 mg by mouth 2 (two) times daily.     omeprazole (PRILOSEC) 20 MG capsule Take 20 mg by mouth every Monday, Wednesday, and Friday. In the evening     polyvinyl alcohol (LIQUIFILM TEARS) 1.4 % ophthalmic solution Place 1 drop into both eyes 3 (three) times daily as needed for dry eyes.     torsemide (DEMADEX) 20 MG tablet Take 20 mg by mouth in the morning.     zinc gluconate 50 MG tablet Take 50 mg by mouth daily at 12 noon.     No current facility-administered medications for this encounter.    Physical Exam: BP 130/80   Pulse (!) 122   Ht 5\' 9"  (1.753 m)   Wt 74.3 kg   BMI 24.19 kg/m   GEN: Well nourished, well developed in no acute distress NECK: No JVD; No carotid bruits CARDIAC: Irregularly irregular rate and rhythm, no murmurs, rubs, gallops RESPIRATORY:  Clear to auscultation without rales, wheezing or rhonchi  ABDOMEN: Soft, non-tender, non-distended EXTREMITIES:  No edema; No deformity   Wt Readings from Last 3 Encounters:  04/05/23 74.3 kg  03/26/23 74.1 kg  03/23/23 74 kg     EKG today demonstrates  Afib with RVR, PVC, NST Vent. rate 122 BPM PR interval * ms QRS duration 96 ms QT/QTcB 320/456 ms  Echo 11/30/22 demonstrated   1. Left ventricular ejection fraction, by estimation, is 55 to 60%. Left  ventricular ejection fraction by 3D volume is 59 %. The left ventricle has  normal function. The left ventricle has no regional wall motion  abnormalities. Left ventricular diastolic parameters were normal.   2. Right ventricular systolic function is normal. The right ventricular  size is normal. There is moderately elevated pulmonary artery systolic  pressure. The estimated right ventricular systolic pressure is 45.5 mmHg.   3. Left atrial size was severely dilated.   4. Right atrial size was mildly dilated.   5. The mitral valve is degenerative. Mild to moderate mitral valve  regurgitation.   6. The tricuspid valve is degenerative. Tricuspid valve  regurgitation is  moderate to severe.   7. The aortic valve is tricuspid. Aortic valve regurgitation is trivial.  Aortic valve sclerosis/calcification is present, without any evidence of  aortic stenosis.   8. The inferior vena cava is normal in size with greater than 50%  respiratory variability, suggesting right atrial pressure of 3 mmHg.   Comparison(s): Changes from prior study are noted. 03/16/2022: LVEF 55-60%, RVSP 69.8 mmHg, severe LAE, mild RAE, moderate MR, moderate TR.    CHA2DS2-VASc Score = 3  The patient's score is based upon: CHF History: 0 (EF normalized with SR) HTN History: 1 Diabetes History: 0 Stroke History: 0 Vascular Disease History: 0 Age Score: 2 Gender Score: 0  ASSESSMENT AND PLAN: Persistent Atrial Fibrillation (ICD10:  I48.19) The patient's CHA2DS2-VASc score is 3, indicating a 3.2% annual risk of stroke.   S/p afib ablation 2014 S/p DCCV on 04/03/23 with quick return of afib We discussed rhythm control options today. Long term, planned for repeat ablation with Dr Nelly Laurence. Short term, we discussed increasing amiodarone and trying another DCCV vs rate control until ablation. Patient would like to try rate control for now. Will start Toprol 25 mg daily. Recheck in two weeks. If he is difficult to rate control, will reload amio and try DCCV. Continue edoxaban 30 mg daily Continue amiodarone 200 mg daily  Secondary Hypercoagulable State (ICD10:  D68.69) The patient is at significant risk for stroke/thromboembolism based upon his CHA2DS2-VASc Score of 3.  Continue Edoxaban ([name]).   HTN Stable on current regimen    Follow up in the AF clinic in 2 weeks.       Jorja Loa PA-C [facility] [address] [place], [place] [address] [phone]

## 2023-04-11 ENCOUNTER — Telehealth: Payer: Self-pay

## 2023-04-11 NOTE — Telephone Encounter (Signed)
Spoke with patient, ablation scheduled on 08/22/23. Office visit with Dr Nelly Laurence already scheduled for 07/26/23, we will complete lab work at that visit. No needs at this time

## 2023-04-12 ENCOUNTER — Telehealth: Payer: Self-pay | Admitting: Cardiovascular Disease

## 2023-04-12 DIAGNOSIS — G4733 Obstructive sleep apnea (adult) (pediatric): Secondary | ICD-10-CM

## 2023-04-12 NOTE — Telephone Encounter (Signed)
Pt calling to f/u on CPAP Machine and supplies. He states that he has yet to receive anything nor a callback as to what is going on. Pt would like a callback regarding this matter. Please advise

## 2023-04-13 DIAGNOSIS — I480 Paroxysmal atrial fibrillation: Secondary | ICD-10-CM | POA: Diagnosis not present

## 2023-04-13 DIAGNOSIS — I272 Pulmonary hypertension, unspecified: Secondary | ICD-10-CM | POA: Diagnosis not present

## 2023-04-13 DIAGNOSIS — N184 Chronic kidney disease, stage 4 (severe): Secondary | ICD-10-CM | POA: Diagnosis not present

## 2023-04-13 DIAGNOSIS — E039 Hypothyroidism, unspecified: Secondary | ICD-10-CM | POA: Diagnosis not present

## 2023-04-13 NOTE — Telephone Encounter (Signed)
Pt is following up on his sleep studies machine. Please advise

## 2023-04-16 NOTE — Telephone Encounter (Signed)
I reached ou to [employer] and they will contact him with an update.

## 2023-04-16 NOTE — Telephone Encounter (Signed)
Pt calling in for an update on receiving cpap

## 2023-04-18 ENCOUNTER — Encounter: Payer: Self-pay | Admitting: Cardiovascular Disease

## 2023-04-19 ENCOUNTER — Ambulatory Visit (HOSPITAL_COMMUNITY)
Admission: RE | Admit: 2023-04-19 | Discharge: 2023-04-19 | Disposition: A | Discharge status: Home / Self Care | Payer: Medicare Other | Source: Ambulatory Visit | Attending: Physician Assistant | Admitting: Physician Assistant

## 2023-04-19 ENCOUNTER — Other Ambulatory Visit: Payer: Self-pay

## 2023-04-19 VITALS — BP 108/56 | HR 98 | Ht 69.0 in | Wt 164.6 lb

## 2023-04-19 DIAGNOSIS — D6869 Other thrombophilia: Secondary | ICD-10-CM | POA: Diagnosis not present

## 2023-04-19 DIAGNOSIS — Z79899 Other long term (current) drug therapy: Secondary | ICD-10-CM

## 2023-04-19 DIAGNOSIS — R9431 Abnormal electrocardiogram [ECG] [EKG]: Secondary | ICD-10-CM | POA: Insufficient documentation

## 2023-04-19 DIAGNOSIS — N183 Chronic kidney disease, stage 3 unspecified: Secondary | ICD-10-CM | POA: Insufficient documentation

## 2023-04-19 DIAGNOSIS — I4819 Other persistent atrial fibrillation: Secondary | ICD-10-CM | POA: Diagnosis not present

## 2023-04-19 DIAGNOSIS — I130 Hypertensive heart and chronic kidney disease with heart failure and stage 1 through stage 4 chronic kidney disease, or unspecified chronic kidney disease: Secondary | ICD-10-CM | POA: Diagnosis not present

## 2023-04-19 DIAGNOSIS — I509 Heart failure, unspecified: Secondary | ICD-10-CM | POA: Diagnosis not present

## 2023-04-19 DIAGNOSIS — I4891 Unspecified atrial fibrillation: Secondary | ICD-10-CM | POA: Diagnosis not present

## 2023-04-19 DIAGNOSIS — I340 Nonrheumatic mitral (valve) insufficiency: Secondary | ICD-10-CM | POA: Diagnosis not present

## 2023-04-19 DIAGNOSIS — Z7902 Long term (current) use of antithrombotics/antiplatelets: Secondary | ICD-10-CM | POA: Diagnosis not present

## 2023-04-19 DIAGNOSIS — Z5181 Encounter for therapeutic drug level monitoring: Secondary | ICD-10-CM

## 2023-04-19 MED ORDER — AMLODIPINE BESYLATE 10 MG PO TABS: 5.0000 mg | ORAL_TABLET | Freq: Every day | ORAL | Status: DC

## 2023-04-19 MED ORDER — METOPROLOL SUCCINATE ER 25 MG PO TB24: 25.0000 mg | ORAL_TABLET | Freq: Two times a day (BID) | ORAL | 1 refills | Status: DC

## 2023-04-19 NOTE — Progress Notes (Signed)
Primary Care Physician: Jackelyn Poling, DO Primary Cardiologist: Olga Millers, MD Electrophysiologist: Maurice Small, MD  Referring Physician: Dr Nelly Laurence    Charles [name] is a 82 y.o. male with a history of tachycardia mediated CM, mitral valve regurgitation, CKD, HTN, hypothyroidism, OSA, atrial fibrillation who presents for follow up in the [facility]. He underwent atrial fibrillation ablation in June 2014 by Dr. [name]. He has been maintained on amiodarone. Patient is on edoxaban for a CHADS2VASC score of 3.  He was seen by Dr Nelly Laurence 03/23/23 and was back in afib. DCCV was arranged for 04/03/23 but patient only remained in SR for a few hours.   On follow up today, patient reports that he feels about the same with fatigue and intermittent dizziness but his heart rates have improved. They typically stay ~100 bpm. He has noted some low BP readings.   Today, he denies symptoms of palpitations, chest pain, shortness of breath, orthopnea, PND, lower extremity edema, presyncope, syncope, snoring, daytime somnolence, bleeding, or neurologic sequela. The patient is tolerating medications without difficulties and is otherwise without complaint today.    Atrial Fibrillation Risk Factors:  he does have symptoms or diagnosis of sleep apnea. he is compliant with CPAP therapy. he does not have a history of rheumatic fever.   Atrial Fibrillation Management history:  Previous antiarrhythmic drugs: amiodarone  Previous cardioversions: several, most recently 10/23/22, 04/03/23 Previous ablations: 2014 Anticoagulation history: edoxaban   ROS- All systems are reviewed and negative except as per the HPI above.  Past Medical History:  Diagnosis Date   Arthritis    Atrial dilatation    mild biatrial dilitation   Bruises easily    CHF (congestive heart failure) (HCC)    CKD (chronic kidney disease), stage III (HCC)    Coronary artery disease    Dysrhythmia     atrial fibrillation   GERD (gastroesophageal reflux disease)    H/O hiatal hernia    Hypercholesterolemia    Hyperglycemia    Hypertension    Hypothyroidism    Mitral regurgitation    a. Mild by echo in 01/2014.   Nonischemic cardiomyopathy (HCC)    a. presumed to be tachycardia mediated. Varying EFs over the years from 35-40% to normal and back.   Paroxysmal atrial fibrillation (HCC)    a. H/o difficult to control. b. s/p PVI ablation 12-03-2012 by [name] with recurrence afterwards, requiring period of amiodarone.   Peripheral edema    Sleep apnea    USES C PAP   Tear of medial meniscus of left knee    Tremor     Current Outpatient Medications  Medication Sig Dispense Refill   acetaminophen (TYLENOL) 650 MG CR tablet Take 650 mg by mouth as needed.     amiodarone (PACERONE) 200 MG tablet TAKE 1 TABLET BY MOUTH DAILY 90 tablet 3   amLODipine (NORVASC) 10 MG tablet TAKE 1 TABLET BY MOUTH DAILY 90 tablet 3   Ascorbic Acid (VITAMIN C PO) Take 1 tablet by mouth 2 (two) times daily.     Cholecalciferol (VITAMIN D) 125 MCG (5000 UT) CAPS Take 5,000 Units by mouth in the morning.     Coenzyme Q10 (COQ10) 100 MG CAPS Take 100 mg by mouth every morning.     edoxaban ([name]) 60 MG TABS tablet Take 30 mg by mouth daily. (Patient taking differently: Take 30 mg by mouth every evening.) 90 tablet 3   enalapril (VASOTEC) 20 MG tablet TAKE  1/2 TABLET BY MOUTH EVERY NIGHT AT BEDTIME 45 tablet 6   EQ MAGNESIUM CITRATE PO Take 420 mg by mouth daily at 12 noon.     IRON PO Take 65 mg by mouth daily at 12 noon.     levothyroxine (SYNTHROID) 125 MCG tablet TAKE 1 TABLET BY MOUTH DAILY BEFORE BREAKFAST 90 tablet 1   metoprolol succinate (TOPROL XL) 25 MG 24 hr tablet Take 1 tablet (25 mg total) by mouth daily. 90 tablet 0   Misc Natural Products (OSTEO BI-FLEX JOINT SHIELD PO) Take 2 tablets by mouth every morning.     Multiple Vitamin (MULTIVITAMIN) tablet Take 1 tablet by mouth daily at 12 noon.      Omega-3 Fatty Acids (FISH OIL) 1200 MG CAPS Take 1,200 mg by mouth 2 (two) times daily.     omeprazole (PRILOSEC) 20 MG capsule Take 20 mg by mouth every Monday, Wednesday, and Friday. In the evening     polyvinyl alcohol (LIQUIFILM TEARS) 1.4 % ophthalmic solution Place 1 drop into both eyes 3 (three) times daily as needed for dry eyes.     torsemide (DEMADEX) 20 MG tablet Take 20 mg by mouth in the morning.     zinc gluconate 50 MG tablet Take 50 mg by mouth daily at 12 noon.     No current facility-administered medications for this encounter.    Physical Exam: BP (!) 108/56   Pulse 98   Ht 5\' 9"  (1.753 m)   Wt 74.7 kg   BMI 24.31 kg/m   GEN: Well nourished, well developed in no acute distress NECK: No JVD; No carotid bruits CARDIAC: Irregularly irregular rate and rhythm, no murmurs, rubs, gallops RESPIRATORY:  Clear to auscultation without rales, wheezing or rhonchi  ABDOMEN: Soft, non-tender, non-distended EXTREMITIES:  No edema; No deformity    Wt Readings from Last 3 Encounters:  04/19/23 74.7 kg  04/05/23 74.3 kg  03/26/23 74.1 kg     EKG today demonstrates  Afib, NST Vent. rate 98 BPM PR interval * ms QRS duration 94 ms QT/QTcB 338/431 ms  Echo 11/30/22 demonstrated   1. Left ventricular ejection fraction, by estimation, is 55 to 60%. Left  ventricular ejection fraction by 3D volume is 59 %. The left ventricle has  normal function. The left ventricle has no regional wall motion  abnormalities. Left ventricular diastolic parameters were normal.   2. Right ventricular systolic function is normal. The right ventricular  size is normal. There is moderately elevated pulmonary artery systolic  pressure. The estimated right ventricular systolic pressure is 45.5 mmHg.   3. Left atrial size was severely dilated.   4. Right atrial size was mildly dilated.   5. The mitral valve is degenerative. Mild to moderate mitral valve  regurgitation.   6. The tricuspid valve is  degenerative. Tricuspid valve regurgitation is  moderate to severe.   7. The aortic valve is tricuspid. Aortic valve regurgitation is trivial.  Aortic valve sclerosis/calcification is present, without any evidence of  aortic stenosis.   8. The inferior vena cava is normal in size with greater than 50%  respiratory variability, suggesting right atrial pressure of 3 mmHg.   Comparison(s): Changes from prior study are noted. 03/16/2022: LVEF 55-60%, RVSP 69.8 mmHg, severe LAE, mild RAE, moderate MR, moderate TR.    CHA2DS2-VASc Score = 3  The patient's score is based upon: CHF History: 0 (EF normalized with SR) HTN History: 1 Diabetes History: 0 Stroke History: 0 Vascular Disease History:  0 Age Score: 2 Gender Score: 0       ASSESSMENT AND PLAN: Persistent Atrial Fibrillation (ICD10:  I48.19) The patient's CHA2DS2-VASc score is 3, indicating a 3.2% annual risk of stroke.   S/p afib ablation 2014 S/p DCCV on 04/03/23 with quick return of afib Plan for rate control until ablation. Will increase Toprol to 25 mg BID If he is difficult to rate control, will reload amio and try DCCV. Continue edoxaban 30 mg daily Continue amiodarone 200 mg daily  Secondary Hypercoagulable State (ICD10:  D68.69) The patient is at significant risk for stroke/thromboembolism based upon his CHA2DS2-VASc Score of 3.  Continue Edoxaban ([name]).   HTN Stable today but low BP readings at home. Will have him cut amlodipine in half to accommodate higher rate control.     Follow up with Dr Jens Som, Dr Tresa Endo, and Dr Nelly Laurence as scheduled.       Jorja Loa PA-C Afib Clinic [facility] [address] [place], [place] [address] [phone]

## 2023-04-19 NOTE — Patient Instructions (Signed)
Decrease Amlodipine 5mg  (half tablet)  Increase Metoprolol 25mg  twice daily

## 2023-05-02 DIAGNOSIS — M79645 Pain in left finger(s): Secondary | ICD-10-CM | POA: Diagnosis not present

## 2023-05-08 ENCOUNTER — Ambulatory Visit: Payer: Medicare Other | Admitting: Cardiovascular Disease

## 2023-05-18 ENCOUNTER — Telehealth: Payer: Self-pay | Admitting: Cardiovascular Disease

## 2023-05-18 NOTE — Telephone Encounter (Signed)
Charles [name] with [employer] called in stating the prescription she received is missing the "heated humidifier." She asked if a new prescription can be sent over that includes that information. Please advise.

## 2023-05-21 ENCOUNTER — Other Ambulatory Visit: Payer: Self-pay

## 2023-05-21 DIAGNOSIS — G4733 Obstructive sleep apnea (adult) (pediatric): Secondary | ICD-10-CM

## 2023-05-21 NOTE — Progress Notes (Signed)
HPI: FU atrial fibrillation. He has a past history of paroxysmal atrial fibrillation and tachycardia mediated cardiomyopathy. He underwent mapping and ablation of atrial fib in 11/2012 by Dr. [name]. Carotid dopplers April 2017 normal. Echocardiogram 6/24 showed normal LV function, severe LAE, mild RAE, moderate pulmonary hypertension, mild to moderate MR, moderate to severe TR, trace AI.  Recent DCCV but atrial fibrillation recurred. Scheduled for ablation with Dr Nelly Laurence. Since last seen, he has some fatigue but denies dyspnea, chest pain, palpitations, syncope or bleeding.  Current Outpatient Medications  Medication Sig Dispense Refill   acetaminophen (TYLENOL) 650 MG CR tablet Take 650 mg by mouth as needed.     amiodarone (PACERONE) 200 MG tablet TAKE 1 TABLET BY MOUTH DAILY 90 tablet 3   amLODipine (NORVASC) 10 MG tablet Take 0.5 tablets (5 mg total) by mouth daily.     Ascorbic Acid (VITAMIN C PO) Take 1 tablet by mouth 2 (two) times daily.     Cholecalciferol (VITAMIN D) 125 MCG (5000 UT) CAPS Take 5,000 Units by mouth in the morning.     Coenzyme Q10 (COQ10) 100 MG CAPS Take 100 mg by mouth every morning.     edoxaban ([name]) 60 MG TABS tablet Take 30 mg by mouth daily. (Patient taking differently: Take 30 mg by mouth every evening.) 90 tablet 3   enalapril (VASOTEC) 20 MG tablet TAKE 1/2 TABLET BY MOUTH EVERY NIGHT AT BEDTIME 45 tablet 6   EQ MAGNESIUM CITRATE PO Take 420 mg by mouth daily at 12 noon.     IRON PO Take 65 mg by mouth daily at 12 noon.     levothyroxine (SYNTHROID) 125 MCG tablet TAKE 1 TABLET BY MOUTH DAILY BEFORE BREAKFAST 90 tablet 1   metoprolol succinate (TOPROL XL) 25 MG 24 hr tablet Take 1 tablet (25 mg total) by mouth in the morning and at bedtime. 180 tablet 1   Misc Natural Products (OSTEO BI-FLEX JOINT SHIELD PO) Take 2 tablets by mouth every morning.     Multiple Vitamin (MULTIVITAMIN) tablet Take 1 tablet by mouth daily at 12 noon.     Omega-3 Fatty  Acids (FISH OIL) 1200 MG CAPS Take 1,200 mg by mouth 2 (two) times daily.     omeprazole (PRILOSEC) 20 MG capsule Take 20 mg by mouth every Monday, Wednesday, and Friday. In the evening     polyvinyl alcohol (LIQUIFILM TEARS) 1.4 % ophthalmic solution Place 1 drop into both eyes 3 (three) times daily as needed for dry eyes.     torsemide (DEMADEX) 20 MG tablet Take 20 mg by mouth in the morning.     zinc gluconate 50 MG tablet Take 50 mg by mouth daily at 12 noon.     No current facility-administered medications for this visit.     Past Medical History:  Diagnosis Date   Arthritis    Atrial dilatation    mild biatrial dilitation   Bruises easily    CHF (congestive heart failure) (HCC)    CKD (chronic kidney disease), stage III (HCC)    Coronary artery disease    Dysrhythmia    atrial fibrillation   GERD (gastroesophageal reflux disease)    H/O hiatal hernia    Hypercholesterolemia    Hyperglycemia    Hypertension    Hypothyroidism    Mitral regurgitation    a. Mild by echo in 01/2014.   Nonischemic cardiomyopathy (HCC)    a. presumed to be tachycardia mediated. Varying EFs over  the years from 35-40% to normal and back.   Paroxysmal atrial fibrillation (HCC)    a. H/o difficult to control. b. s/p PVI ablation 12-03-2012 by [name] with recurrence afterwards, requiring period of amiodarone.   Peripheral edema    Sleep apnea    USES C PAP   Tear of medial meniscus of left knee    Tremor     Past Surgical History:  Procedure Laterality Date   ATRIAL FIBRILLATION ABLATION N/A 12/03/2012   Procedure: ATRIAL FIBRILLATION ABLATION;  Surgeon: Hillis Range, MD;  Location: [facility] CATH LAB;  Service: Cardiovascular;  Laterality: N/A;   ATRIAL FIBRILLATION ABLATION     PVI by [name]   CARDIAC CATHETERIZATION     CARDIOVERSION N/A 09/04/2012   Procedure: CARDIOVERSION;  Surgeon: [name], MD;  Location: [facility] ENDOSCOPY;  Service: Cardiovascular;  Laterality: N/A;   CARDIOVERSION  N/A 02/11/2013   Procedure: CARDIOVERSION;  Surgeon: [name], MD;  Location: [facility] ENDOSCOPY;  Service: Cardiovascular;  Laterality: N/A;   CARDIOVERSION N/A 12/07/2015   Procedure: CARDIOVERSION;  Surgeon: Chrystie Nose, MD;  Location: [facility] ENDOSCOPY;  Service: Cardiovascular;  Laterality: N/A;   CARDIOVERSION N/A 03/11/2018   Procedure: CARDIOVERSION;  Surgeon: Lars Masson, MD;  Location: [facility] ENDOSCOPY;  Service: Cardiovascular;  Laterality: N/A;   CARDIOVERSION N/A 09/23/2020   Procedure: CARDIOVERSION;  Surgeon: [name], MD;  Location: [facility] ENDOSCOPY;  Service: Cardiovascular;  Laterality: N/A;   CARDIOVERSION N/A 08/19/2021   Procedure: CARDIOVERSION;  Surgeon: Jake Bathe, MD;  Location: [facility] ENDOSCOPY;  Service: Cardiovascular;  Laterality: N/A;   CARDIOVERSION N/A 10/23/2022   Procedure: CARDIOVERSION;  Surgeon: Meriam Sprague, MD;  Location: [facility] INVASIVE CV LAB;  Service: Cardiovascular;  Laterality: N/A;   CARDIOVERSION N/A 04/03/2023   Procedure: CARDIOVERSION;  Surgeon: Jodelle Red, MD;  Location: [facility] INVASIVE CV LAB;  Service: Cardiovascular;  Laterality: N/A;   CATARACT EXTRACTION, BILATERAL     EYE SURGERY  2015,2016   bilateral cataract surgery with lens implants   HERNIA REPAIR  03/2005   JOINT REPLACEMENT  2005   total right hip replacement   kidney stent  1987, 02/2005   KIDNEY STONE SURGERY     2 stents placed   KNEE ARTHROSCOPY WITH DRILLING/MICROFRACTURE Left 11/20/2013   Procedure: KNEE ARTHROSCOPY WITH DRILLING/MICROFRACTURE;  Surgeon: Jacki Cones, MD;  Location: [facility] ORS;  Service: Orthopedics;  Laterality: Left;   KNEE ARTHROSCOPY WITH MEDIAL MENISECTOMY Left 11/20/2013   Procedure: LEFT KNEE ARTHROSCOPY WITH MEDIAL MENISECTOMY;  Surgeon: Jacki Cones, MD;  Location: [facility] ORS;  Service: Orthopedics;  Laterality: Left;  Microfracture of medial femoral chondyle and abrasion chondroplasty of the medial femoral chondyle   LITHOTRIPSY     x  2   PICC line placement  2007   quadrec  1999   LFT leg quad sx   right hip surgery  2007   irrigation and debridement from bacteria   TEE WITHOUT CARDIOVERSION N/A 12/02/2012   Procedure: TRANSESOPHAGEAL ECHOCARDIOGRAM (TEE);  Surgeon: [name], MD;  Location: [facility] ENDOSCOPY;  Service: Cardiovascular;  Laterality: N/A;   TOTAL HIP ARTHROPLASTY Left 08/12/2015   Procedure: LEFT TOTAL HIP ARTHROPLASTY;  Surgeon: Ranee Gosselin, MD;  Location: [facility] ORS;  Service: Orthopedics;  Laterality: Left;    Social History   Socioeconomic History   Marital status: Married    Spouse name: Kathie Rhodes   Number of children: 2   Years of education: Not on file   Highest  education level: Some college, no degree  Occupational History   Occupation: Recruitment consultant: RETIRED  Tobacco Use   Smoking status: Never   Smokeless tobacco: Never   Tobacco comments:    Never smoked 04/05/23  Vaping Use   Vaping status: Never Used  Substance and Sexual Activity   Alcohol use: No   Drug use: No   Sexual activity: Not on file  Other Topics Concern   Not on file  Social History Narrative   Lives in [place] with spouse.  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   [employer] [facility]:   Has worked extensively in [employer]. Significant asbestos asbestos exposure. No bird or mold exposure.       Right handed   Social Determinants of Health   Financial Resource Strain: Not on file  Food Insecurity: Not on file  Transportation Needs: Not on file  Physical Activity: Not on file  Stress: Not on file  Social Connections: Not on file  Intimate Partner Violence: Not on file    Family History  Problem Relation Age of Onset   Cancer Mother 46       CANCER   Heart disease Father 32       HEART PROBLEMS   Lupus Brother        "of the skin"    ROS: no fevers or chills, productive cough, hemoptysis, dysphasia, odynophagia, melena, hematochezia,  dysuria, hematuria, rash, seizure activity, orthopnea, PND, pedal edema, claudication. Remaining systems are negative.  Physical Exam: Well-developed well-nourished in no acute distress.  Skin is warm and dry.  HEENT is normal.  Neck is supple.  Chest is clear to auscultation with normal expansion.  Cardiovascular exam is irregular Abdominal exam nontender or distended. No masses palpated. Extremities show no edema. neuro grossly intact   A/P  1 persistent atrial fibrillation-scheduled for ablation by Dr Nelly Laurence. Continue edoxaban, toprol and amiodarone.     2 mitral regurgitation-mild to moderate on most recent echocardiogram; also with moderate to severe TR.  Will repeat echocardiogram 6/25.   3 hypertension-BP controlled.  Continue present medical regimen.   4 history of chronic stage IIIb kidney disease-followed by nephrology   4 lower extremity edema-controlled; continue diuretic at present dose.  Olga Millers, MD

## 2023-05-29 ENCOUNTER — Ambulatory Visit: Payer: Medicare Other | Attending: Cardiology | Admitting: Cardiology

## 2023-05-29 ENCOUNTER — Encounter: Payer: Self-pay | Admitting: Cardiology

## 2023-05-29 VITALS — BP 98/50 | HR 85 | Ht 69.0 in | Wt 168.0 lb

## 2023-05-29 DIAGNOSIS — I340 Nonrheumatic mitral (valve) insufficiency: Secondary | ICD-10-CM | POA: Diagnosis not present

## 2023-05-29 DIAGNOSIS — D6869 Other thrombophilia: Secondary | ICD-10-CM | POA: Diagnosis not present

## 2023-05-29 DIAGNOSIS — I10 Essential (primary) hypertension: Secondary | ICD-10-CM | POA: Diagnosis not present

## 2023-05-29 DIAGNOSIS — I4819 Other persistent atrial fibrillation: Secondary | ICD-10-CM

## 2023-05-29 NOTE — Patient Instructions (Signed)

## 2023-06-08 DIAGNOSIS — N184 Chronic kidney disease, stage 4 (severe): Secondary | ICD-10-CM | POA: Diagnosis not present

## 2023-06-13 DIAGNOSIS — N184 Chronic kidney disease, stage 4 (severe): Secondary | ICD-10-CM | POA: Diagnosis not present

## 2023-06-13 DIAGNOSIS — D631 Anemia in chronic kidney disease: Secondary | ICD-10-CM | POA: Diagnosis not present

## 2023-06-13 DIAGNOSIS — I129 Hypertensive chronic kidney disease with stage 1 through stage 4 chronic kidney disease, or unspecified chronic kidney disease: Secondary | ICD-10-CM | POA: Diagnosis not present

## 2023-06-13 DIAGNOSIS — N2581 Secondary hyperparathyroidism of renal origin: Secondary | ICD-10-CM | POA: Diagnosis not present

## 2023-06-21 DIAGNOSIS — M71179 Other infective bursitis, unspecified ankle and foot: Secondary | ICD-10-CM | POA: Diagnosis not present

## 2023-06-21 DIAGNOSIS — M21612 Bunion of left foot: Secondary | ICD-10-CM | POA: Diagnosis not present

## 2023-06-21 DIAGNOSIS — M21611 Bunion of right foot: Secondary | ICD-10-CM | POA: Diagnosis not present

## 2023-06-28 ENCOUNTER — Other Ambulatory Visit (HOSPITAL_COMMUNITY): Payer: Self-pay | Admitting: Physician Assistant

## 2023-07-02 ENCOUNTER — Ambulatory Visit (INDEPENDENT_AMBULATORY_CARE_PROVIDER_SITE_OTHER): Payer: Medicare Other

## 2023-07-02 ENCOUNTER — Ambulatory Visit: Payer: Medicare Other | Admitting: Podiatry

## 2023-07-02 DIAGNOSIS — M21612 Bunion of left foot: Secondary | ICD-10-CM

## 2023-07-02 DIAGNOSIS — L02619 Cutaneous abscess of unspecified foot: Secondary | ICD-10-CM | POA: Diagnosis not present

## 2023-07-02 DIAGNOSIS — M21619 Bunion of unspecified foot: Secondary | ICD-10-CM | POA: Diagnosis not present

## 2023-07-02 DIAGNOSIS — S90425D Blister (nonthermal), left lesser toe(s), subsequent encounter: Secondary | ICD-10-CM

## 2023-07-02 DIAGNOSIS — L03119 Cellulitis of unspecified part of limb: Secondary | ICD-10-CM

## 2023-07-02 LAB — CBC WITH DIFFERENTIAL/PLATELET
Absolute Lymphocytes: 2434 {cells}/uL (ref 850–3900)
Absolute Monocytes: 1250 {cells}/uL — ABNORMAL HIGH (ref 200–950)
Basophils Absolute: 53 {cells}/uL (ref 0–200)
Basophils Relative: 0.4 %
Eosinophils Absolute: 67 {cells}/uL (ref 15–500)
Eosinophils Relative: 0.5 %
HCT: 32 % — ABNORMAL LOW (ref 38.5–50.0)
Hemoglobin: 10.4 g/dL — ABNORMAL LOW (ref 13.2–17.1)
MCH: 30.8 pg (ref 27.0–33.0)
MCHC: 32.5 g/dL (ref 32.0–36.0)
MCV: 94.7 fL (ref 80.0–100.0)
MPV: 9.9 fL (ref 7.5–12.5)
Monocytes Relative: 9.4 %
Neutro Abs: 9496 {cells}/uL — ABNORMAL HIGH (ref 1500–7800)
Neutrophils Relative %: 71.4 %
Platelets: 322 10*3/uL (ref 140–400)
RBC: 3.38 10*6/uL — ABNORMAL LOW (ref 4.20–5.80)
RDW: 11.5 % (ref 11.0–15.0)
Total Lymphocyte: 18.3 %
WBC: 13.3 10*3/uL — ABNORMAL HIGH (ref 3.8–10.8)

## 2023-07-02 LAB — SEDIMENTATION RATE: Sed Rate: 79 mm/h — ABNORMAL HIGH (ref 0–20)

## 2023-07-02 MED ORDER — DOXYCYCLINE HYCLATE 100 MG PO TABS: 100.0000 mg | ORAL_TABLET | Freq: Two times a day (BID) | ORAL | 1 refills | Status: DC

## 2023-07-04 NOTE — Progress Notes (Signed)
 Subjective:   Patient ID: Charles [name], male   DOB: 83 y.o.   MRN: [id number]   HPI Patient presents with a lot of redness around the left forefoot that they states been present for several months with history of 5 days antibiotics but has developed breakdown of tissue around the big toe joint left on the medial side.  Extender with the patient having no history of fever or no history of chills or indications of systemic infection.  Patient does not smoke likes to be active   Review of Systems  All other systems reviewed and are negative.       Objective:  Physical Exam Vitals and nursing note reviewed.  Constitutional:      Appearance: He is well-developed.  Pulmonary:     Effort: Pulmonary effort is normal.  Musculoskeletal:        General: Normal range of motion.  Skin:    General: Skin is warm.  Neurological:     Mental Status: He is alert.     Vascular status moderately diminished but no history of claudication and question today without history of claudication.  Patient is found to have drainage in the left first metatarsal head with small breakdown of tissue and fluid buildup with abscess formation local and into the middle of the foot there is redness no redness extending more proximal from this negative [place]' sign noted no streaking noted.  No indication systemic condition normal temperature today     Assessment:  Infection with abscess of the first MPJ left that has been present for a while that seems to have gotten worse recently with them not being very good historians     Plan:  HP reviewed and at this point using sterile instrumentation I open the area of there was purulent drainage I flushed all this out after doing culture and I did not note any bone or capsule tendon exposure.  I applied sterile dressing and surgical shoe I am sending for blood work and I placed on doxycycline  twice daily and sending culture off.  Patient will be seen back 1 week strict  instructions if any systemic signs of infection were to occur he is to go straight to the emergency room and he understands he may lose bone with this condition or have to go to the hospital for IV antibiotics if we cannot gain it under control with oral medicine and offloading  X-rays indicate there is no signs of osteolysis of the bone in this area moderate osteoporosis no other pathology

## 2023-07-09 ENCOUNTER — Ambulatory Visit: Payer: Medicare Other | Admitting: Cardiovascular Disease

## 2023-07-11 ENCOUNTER — Ambulatory Visit: Payer: Medicare Other | Admitting: Podiatry

## 2023-07-18 ENCOUNTER — Emergency Department (HOSPITAL_BASED_OUTPATIENT_CLINIC_OR_DEPARTMENT_OTHER): Payer: Medicare Other

## 2023-07-18 ENCOUNTER — Other Ambulatory Visit: Payer: Self-pay

## 2023-07-18 ENCOUNTER — Emergency Department (HOSPITAL_BASED_OUTPATIENT_CLINIC_OR_DEPARTMENT_OTHER)
Admission: EM | Admit: 2023-07-18 | Discharge: 2023-07-18 | Disposition: A | Discharge status: Home / Self Care | Payer: Medicare Other | Attending: Emergency Medicine | Admitting: Emergency Medicine

## 2023-07-18 ENCOUNTER — Encounter (HOSPITAL_BASED_OUTPATIENT_CLINIC_OR_DEPARTMENT_OTHER): Payer: Self-pay

## 2023-07-18 DIAGNOSIS — S301XXA Contusion of abdominal wall, initial encounter: Secondary | ICD-10-CM | POA: Insufficient documentation

## 2023-07-18 DIAGNOSIS — N200 Calculus of kidney: Secondary | ICD-10-CM | POA: Diagnosis not present

## 2023-07-18 DIAGNOSIS — S0990XA Unspecified injury of head, initial encounter: Secondary | ICD-10-CM | POA: Diagnosis not present

## 2023-07-18 DIAGNOSIS — S299XXA Unspecified injury of thorax, initial encounter: Secondary | ICD-10-CM | POA: Diagnosis not present

## 2023-07-18 DIAGNOSIS — Z23 Encounter for immunization: Secondary | ICD-10-CM | POA: Insufficient documentation

## 2023-07-18 DIAGNOSIS — M4802 Spinal stenosis, cervical region: Secondary | ICD-10-CM | POA: Diagnosis not present

## 2023-07-18 DIAGNOSIS — S0003XA Contusion of scalp, initial encounter: Secondary | ICD-10-CM | POA: Diagnosis not present

## 2023-07-18 DIAGNOSIS — J8410 Pulmonary fibrosis, unspecified: Secondary | ICD-10-CM | POA: Diagnosis not present

## 2023-07-18 DIAGNOSIS — M47812 Spondylosis without myelopathy or radiculopathy, cervical region: Secondary | ICD-10-CM | POA: Diagnosis not present

## 2023-07-18 DIAGNOSIS — S2241XA Multiple fractures of ribs, right side, initial encounter for closed fracture: Secondary | ICD-10-CM | POA: Insufficient documentation

## 2023-07-18 DIAGNOSIS — W01198A Fall on same level from slipping, tripping and stumbling with subsequent striking against other object, initial encounter: Secondary | ICD-10-CM | POA: Diagnosis not present

## 2023-07-18 DIAGNOSIS — W19XXXA Unspecified fall, initial encounter: Secondary | ICD-10-CM

## 2023-07-18 DIAGNOSIS — M5020 Other cervical disc displacement, unspecified cervical region: Secondary | ICD-10-CM | POA: Diagnosis not present

## 2023-07-18 DIAGNOSIS — S2241XD Multiple fractures of ribs, right side, subsequent encounter for fracture with routine healing: Secondary | ICD-10-CM | POA: Diagnosis not present

## 2023-07-18 DIAGNOSIS — Y92002 Bathroom of unspecified non-institutional (private) residence single-family (private) house as the place of occurrence of the external cause: Secondary | ICD-10-CM | POA: Diagnosis not present

## 2023-07-18 DIAGNOSIS — S2231XD Fracture of one rib, right side, subsequent encounter for fracture with routine healing: Secondary | ICD-10-CM | POA: Diagnosis not present

## 2023-07-18 LAB — CBC WITH DIFFERENTIAL/PLATELET
Abs Immature Granulocytes: 0.56 10*3/uL — ABNORMAL HIGH (ref 0.00–0.07)
Basophils Absolute: 0.1 10*3/uL (ref 0.0–0.1)
Basophils Relative: 1 %
Eosinophils Absolute: 0 10*3/uL (ref 0.0–0.5)
Eosinophils Relative: 0 %
HCT: 34.7 % — ABNORMAL LOW (ref 39.0–52.0)
Hemoglobin: 11.1 g/dL — ABNORMAL LOW (ref 13.0–17.0)
Immature Granulocytes: 5 %
Lymphocytes Relative: 19 %
Lymphs Abs: 2 10*3/uL (ref 0.7–4.0)
MCH: 30.6 pg (ref 26.0–34.0)
MCHC: 32 g/dL (ref 30.0–36.0)
MCV: 95.6 fL (ref 80.0–100.0)
Monocytes Absolute: 1 10*3/uL (ref 0.1–1.0)
Monocytes Relative: 9 %
Neutro Abs: 6.9 10*3/uL (ref 1.7–7.7)
Neutrophils Relative %: 66 %
Platelets: 341 10*3/uL (ref 150–400)
RBC: 3.63 MIL/uL — ABNORMAL LOW (ref 4.22–5.81)
RDW: 12.9 % (ref 11.5–15.5)
WBC: 10.5 10*3/uL (ref 4.0–10.5)
nRBC: 0 % (ref 0.0–0.2)

## 2023-07-18 LAB — COMPREHENSIVE METABOLIC PANEL
ALT: 21 U/L (ref 0–44)
AST: 34 U/L (ref 15–41)
Albumin: 4 g/dL (ref 3.5–5.0)
Alkaline Phosphatase: 57 U/L (ref 38–126)
Anion gap: 12 (ref 5–15)
BUN: 65 mg/dL — ABNORMAL HIGH (ref 8–23)
CO2: 26 mmol/L (ref 22–32)
Calcium: 10.5 mg/dL — ABNORMAL HIGH (ref 8.9–10.3)
Chloride: 102 mmol/L (ref 98–111)
Creatinine, Ser: 2.77 mg/dL — ABNORMAL HIGH (ref 0.61–1.24)
GFR, Estimated: 22 mL/min — ABNORMAL LOW (ref >60)
Glucose, Bld: 163 mg/dL — ABNORMAL HIGH (ref 70–99)
Potassium: 4.3 mmol/L (ref 3.5–5.1)
Sodium: 140 mmol/L (ref 135–145)
Total Bilirubin: 0.4 mg/dL (ref 0.0–1.2)
Total Protein: 6.8 g/dL (ref 6.5–8.1)

## 2023-07-18 MED ORDER — OXYCODONE-ACETAMINOPHEN 5-325 MG PO TABS
1.0000 | ORAL_TABLET | Freq: Once | ORAL | Status: AC
  Administered 2023-07-18: 1 via ORAL
  Filled 2023-07-18: qty 1

## 2023-07-18 MED ORDER — SODIUM CHLORIDE 0.9 % IV BOLUS
1000.0000 mL | Freq: Once | INTRAVENOUS | Status: AC
  Administered 2023-07-18: 1000 mL via INTRAVENOUS

## 2023-07-18 MED ORDER — TETANUS-DIPHTH-ACELL PERTUSSIS 5-2.5-18.5 LF-MCG/0.5 IM SUSY
0.5000 mL | PREFILLED_SYRINGE | Freq: Once | INTRAMUSCULAR | Status: AC
  Administered 2023-07-18: 0.5 mL via INTRAMUSCULAR
  Filled 2023-07-18: qty 0.5

## 2023-07-18 NOTE — Discharge Instructions (Addendum)
You have multiple rib fractures, I spoke to the trauma surgeon, use the incentive spirometer, to help make sure that you are taking deep breaths.  You can take Tylenol to help with your pain control.  Please also follow-up with the trauma surgery team, in the next week.  You will need repeat x-ray.  Return to the ER if you have any blood in your sputum, shortness of breath, or worsening pain

## 2023-07-18 NOTE — ED Provider Notes (Signed)
[place] EMERGENCY DEPARTMENT AT [facility] Provider Note   CSN: [id number] Arrival date & time: 07/18/23  1302     History  Chief Complaint  Patient presents with   Charles [name] is a 83 y.o. male, hx of afib, who presents to the ED 2/2 to a fall that occurred on 1/14. Reports that he was in the shower, leaned over to wash his hair and got dizzy and fell backwards and hit the R side of his body and his head. Denies LOC. Is on blood thinners. Reports persistent knot on back of head and bruising and pain to R flank. Denies headache, N/V, confusion. Called his doctor today and was told to come into the ED. Denies chest pain or SOB.   Home Medications Prior to Admission medications   Medication Sig Start Date End Date Taking? Authorizing Provider  acetaminophen (TYLENOL) 650 MG CR tablet Take 650 mg by mouth as needed.    [provider]  amiodarone (PACERONE) 200 MG tablet TAKE 1 TABLET BY MOUTH DAILY 01/03/23   Mealor, Roberts Gaudy, MD  amLODipine (NORVASC) 10 MG tablet Take 0.5 tablets (5 mg total) by mouth daily. 04/19/23   Fenton, Clint R, PA  Ascorbic Acid (VITAMIN C PO) Take 1 tablet by mouth 2 (two) times daily.    [provider]  Cholecalciferol (VITAMIN D) 125 MCG (5000 UT) CAPS Take 5,000 Units by mouth in the morning.    [provider]  Coenzyme Q10 (COQ10) 100 MG CAPS Take 100 mg by mouth every morning.    [provider]  doxycycline (VIBRA-TABS) 100 MG tablet Take 1 tablet (100 mg total) by mouth 2 (two) times daily. 07/02/23   Lenn Sink, DPM  edoxaban ([name]) 60 MG TABS tablet Take 30 mg by mouth daily. Patient taking differently: Take 30 mg by mouth every evening. 03/10/22   [name], MD  enalapril (VASOTEC) 20 MG tablet TAKE 1/2 TABLET BY MOUTH EVERY NIGHT AT BEDTIME 08/29/22   [name], MD  EQ MAGNESIUM CITRATE PO Take 420 mg by mouth daily at 12 noon.    [provider]  IRON PO  Take 65 mg by mouth daily at 12 noon.    [provider]  levothyroxine (SYNTHROID) 125 MCG tablet TAKE 1 TABLET BY MOUTH DAILY BEFORE BREAKFAST 02/20/23   [name], MD  metoprolol succinate (TOPROL-XL) 25 MG 24 hr tablet Take 1 tablet (25 mg total) by mouth 2 (two) times daily. 06/28/23   Fenton, Clint R, PA  Misc Natural Products (OSTEO BI-FLEX JOINT SHIELD PO) Take 2 tablets by mouth every morning.    [provider]  Multiple Vitamin (MULTIVITAMIN) tablet Take 1 tablet by mouth daily at 12 noon.    [provider]  Omega-3 Fatty Acids (FISH OIL) 1200 MG CAPS Take 1,200 mg by mouth 2 (two) times daily.    [provider]  omeprazole (PRILOSEC) 20 MG capsule Take 20 mg by mouth every Monday, Wednesday, and Friday. In the evening    [provider]  polyvinyl alcohol (LIQUIFILM TEARS) 1.4 % ophthalmic solution Place 1 drop into both eyes 3 (three) times daily as needed for dry eyes.    [provider]  torsemide (DEMADEX) 20 MG tablet Take 20 mg by mouth in the morning.    [provider]  zinc gluconate 50 MG tablet Take 50 mg by mouth daily at 12 noon.  [provider]      Allergies    Ceftriaxone sodium, Clindamycin/lincomycin, Diltiazem hcl, and Penicillins    Review of Systems   Review of Systems  Respiratory:  Negative for shortness of breath.   Skin:  Negative for wound.    Physical Exam Updated Vital Signs BP 125/69   Pulse 85   Temp 97.7 F (36.5 C)   Resp 16   Ht 5\' 9"  (1.753 m)   Wt 68.9 kg   SpO2 100%   BMI 22.45 kg/m  Physical Exam Vitals and nursing note reviewed.  Constitutional:      General: He is not in acute distress.    Appearance: He is well-developed.  HENT:     Head: Normocephalic and atraumatic.     Comments: +hematoma to L parietal scalp w/healing abrasion Eyes:     Conjunctiva/sclera: Conjunctivae normal.  Cardiovascular:     Rate and Rhythm: Normal rate and regular  rhythm.     Heart sounds: No murmur heard. Pulmonary:     Effort: Pulmonary effort is normal. No respiratory distress.     Breath sounds: Normal breath sounds.  Abdominal:     Palpations: Abdomen is soft.     Tenderness: There is no abdominal tenderness.  Musculoskeletal:        General: No swelling.     Cervical back: Neck supple.  Skin:    General: Skin is warm and dry.     Capillary Refill: Capillary refill takes less than 2 seconds.     Comments: +large area of ecchymoses to R flank. No open wounds noted.   Neurological:     Mental Status: He is alert.  Psychiatric:        Mood and Affect: Mood normal.     ED Results / Procedures / Treatments   Labs (all labs ordered are listed, but only abnormal results are displayed) Labs Reviewed  CBC WITH DIFFERENTIAL/PLATELET - Abnormal; Notable for the following components:      Result Value   RBC 3.63 (*)    Hemoglobin 11.1 (*)    HCT 34.7 (*)    Abs Immature Granulocytes 0.56 (*)    All other components within normal limits  COMPREHENSIVE METABOLIC PANEL - Abnormal; Notable for the following components:   Glucose, Bld 163 (*)    BUN 65 (*)    Creatinine, Ser 2.77 (*)    Calcium 10.5 (*)    GFR, Estimated 22 (*)    All other components within normal limits    EKG None  Radiology CT CHEST ABDOMEN PELVIS WO CONTRAST Result Date: 07/18/2023 CLINICAL DATA:  Fall 8 days ago.  Right rib pain. EXAM: CT CHEST, ABDOMEN AND PELVIS WITHOUT CONTRAST TECHNIQUE: Multidetector CT imaging of the chest, abdomen and pelvis was performed following the standard protocol without IV contrast. RADIATION DOSE REDUCTION: This exam was performed according to the departmental dose-optimization program which includes automated exposure control, adjustment of the mA and/or kV according to patient size and/or use of iterative reconstruction technique. COMPARISON:  06/13/2016 FINDINGS: CT CHEST FINDINGS Cardiovascular: Heart is normal size. Aorta is normal  caliber. Moderate coronary artery and scattered aortic calcifications. Mediastinum/Nodes: No mediastinal, hilar, or axillary adenopathy. Trachea and esophagus are unremarkable. Thyroid unremarkable. Bianca Raneri hiatal hernia. Lungs/Pleura: Calcified pleural plaques bilaterally. Fibrotic changes noted in the lower lobes, right greater than left. No acute confluent opacities. No effusions. No pneumothorax. Musculoskeletal: Anterolateral right 5th, 6, 8th and 9th rib fractures. Lateral right 11th rib fracture.  These appear acute. There also chronic right rib fractures noted laterally. CT ABDOMEN PELVIS FINDINGS Hepatobiliary: No focal hepatic abnormality. Gallbladder unremarkable. Pancreas: No focal abnormality or ductal dilatation. Spleen: No splenic injury or perisplenic hematoma. Adrenals/Urinary Tract: No adrenal hemorrhage or renal injury identified. Bladder is unremarkable. Bilateral nephrolithiasis. No hydronephrosis. Stomach/Bowel: Stomach, large and Asahel Risden bowel grossly unremarkable. Vascular/Lymphatic: Aortic atherosclerosis. No evidence of aneurysm or adenopathy. Reproductive: No visible focal abnormality. Other: No free fluid or free air. Musculoskeletal: No acute bony abnormality. IMPRESSION: Fractures through the anterolateral right 5th, 6th, 8th, 9th and 11th ribs which appear acute. There also chronic appearing lateral right rib fractures. Fibrosis in the lung bases. Bilateral calcified pleural plaques. Coronary artery disease, aortic atherosclerosis. No acute findings or significant traumatic injury in the abdomen or pelvis. Bilateral nephrolithiasis. Electronically Signed   By: Charlett Nose M.D.   On: 07/18/2023 17:16   CT Head Wo Contrast Result Date: 07/18/2023 CLINICAL DATA:  Provided history: Head trauma, moderate/severe. Neck trauma. Additional history provided: Fall (with head trauma), head discomfort. EXAM: CT HEAD WITHOUT CONTRAST CT CERVICAL SPINE WITHOUT CONTRAST TECHNIQUE: Multidetector CT  imaging of the head and cervical spine was performed following the standard protocol without intravenous contrast. Multiplanar CT image reconstructions of the cervical spine were also generated. RADIATION DOSE REDUCTION: This exam was performed according to the departmental dose-optimization program which includes automated exposure control, adjustment of the mA and/or kV according to patient size and/or use of iterative reconstruction technique. COMPARISON:  None. FINDINGS: CT HEAD FINDINGS Brain: Generalized cerebral and cerebellar volume loss. There is no acute intracranial hemorrhage. No demarcated cortical infarct. No extra-axial fluid collection. No evidence of an intracranial mass. No midline shift. Vascular: No hyperdense vessel. Skull: No calvarial fracture or aggressive osseous lesion. Sinuses/Orbits: No mass or acute finding within the imaged orbits. No significant paranasal sinus disease at the imaged levels. Other: Left parietal scalp hematoma (and possible laceration). CT CERVICAL SPINE FINDINGS Alignment: Mild dextrocurvature of the cervical spine. Nonspecific reversal of the expected cervical lordosis. 4 mm C3-C4 grade 1 anterolisthesis. 3 mm C4-C5 grade 1 anterolisthesis. Slight C7-T1 grade 1 anterolisthesis. Skull base and vertebrae: The basion-dental and atlanto-dental intervals are maintained.No evidence of acute fracture to the cervical spine. Facet ankylosis on the right at C2-C3 and on the right at C3-C4. Sinus process ankylosis at T1-T2. Multilevel ventral osteophytes, most prominent at C4-C5 and C5-C6. Soft tissues and spinal canal: No prevertebral fluid or swelling. No visible canal hematoma. Disc levels: Cervical spondylosis with multilevel disc space narrowing, disc bulges/central disc protrusions, uncovertebral hypertrophy and facet arthrosis. Disc space narrowing is greatest at C5-C6, C6-C7 and T2-T3 (advanced at these levels). No appreciable high-grade spinal canal stenosis. Multilevel  bony neural foraminal narrowing. Upper chest: No consolidation within the imaged lung apices. No visible pneumothorax. IMPRESSION: CT head: 1.  No evidence of an acute intracranial abnormality. 2. Left parietal scalp hematoma (and possible laceration). Direct visualization recommended. 3. Generalized parenchymal atrophy. CT cervical spine: 1. No evidence of an acute cervical spine fracture. 2. Nonspecific reversal of the expected cervical lordosis. 3. Mild dextrocurvature of the cervical spine. 4. Grade 1 spondylolisthesis at C3-C4, C4-C5 and C7-T1. 5. Multilevel cervical spondylosis and vertebral ankylosis as described. Electronically Signed   By: Jackey Loge D.O.   On: 07/18/2023 17:09   CT Cervical Spine Wo Contrast Result Date: 07/18/2023 CLINICAL DATA:  Provided history: Head trauma, moderate/severe. Neck trauma. Additional history provided: Fall (with head trauma), head discomfort. EXAM: CT HEAD  WITHOUT CONTRAST CT CERVICAL SPINE WITHOUT CONTRAST TECHNIQUE: Multidetector CT imaging of the head and cervical spine was performed following the standard protocol without intravenous contrast. Multiplanar CT image reconstructions of the cervical spine were also generated. RADIATION DOSE REDUCTION: This exam was performed according to the departmental dose-optimization program which includes automated exposure control, adjustment of the mA and/or kV according to patient size and/or use of iterative reconstruction technique. COMPARISON:  None. FINDINGS: CT HEAD FINDINGS Brain: Generalized cerebral and cerebellar volume loss. There is no acute intracranial hemorrhage. No demarcated cortical infarct. No extra-axial fluid collection. No evidence of an intracranial mass. No midline shift. Vascular: No hyperdense vessel. Skull: No calvarial fracture or aggressive osseous lesion. Sinuses/Orbits: No mass or acute finding within the imaged orbits. No significant paranasal sinus disease at the imaged levels. Other: Left  parietal scalp hematoma (and possible laceration). CT CERVICAL SPINE FINDINGS Alignment: Mild dextrocurvature of the cervical spine. Nonspecific reversal of the expected cervical lordosis. 4 mm C3-C4 grade 1 anterolisthesis. 3 mm C4-C5 grade 1 anterolisthesis. Slight C7-T1 grade 1 anterolisthesis. Skull base and vertebrae: The basion-dental and atlanto-dental intervals are maintained.No evidence of acute fracture to the cervical spine. Facet ankylosis on the right at C2-C3 and on the right at C3-C4. Sinus process ankylosis at T1-T2. Multilevel ventral osteophytes, most prominent at C4-C5 and C5-C6. Soft tissues and spinal canal: No prevertebral fluid or swelling. No visible canal hematoma. Disc levels: Cervical spondylosis with multilevel disc space narrowing, disc bulges/central disc protrusions, uncovertebral hypertrophy and facet arthrosis. Disc space narrowing is greatest at C5-C6, C6-C7 and T2-T3 (advanced at these levels). No appreciable high-grade spinal canal stenosis. Multilevel bony neural foraminal narrowing. Upper chest: No consolidation within the imaged lung apices. No visible pneumothorax. IMPRESSION: CT head: 1.  No evidence of an acute intracranial abnormality. 2. Left parietal scalp hematoma (and possible laceration). Direct visualization recommended. 3. Generalized parenchymal atrophy. CT cervical spine: 1. No evidence of an acute cervical spine fracture. 2. Nonspecific reversal of the expected cervical lordosis. 3. Mild dextrocurvature of the cervical spine. 4. Grade 1 spondylolisthesis at C3-C4, C4-C5 and C7-T1. 5. Multilevel cervical spondylosis and vertebral ankylosis as described. Electronically Signed   By: Jackey Loge D.O.   On: 07/18/2023 17:09    Procedures Procedures    Medications Ordered in ED Medications  oxyCODONE-acetaminophen (PERCOCET/ROXICET) 5-325 MG per tablet 1 tablet (1 tablet Oral Given 07/18/23 1429)  sodium chloride 0.9 % bolus 1,000 mL (1,000 mLs Intravenous New  Bag/Given 07/18/23 1655)  Tdap (BOOSTRIX) injection 0.5 mL (0.5 mLs Intramuscular Given 07/18/23 1731)    ED Course/ Medical Decision Making/ A&P                                 Medical Decision Making Patient is a 83 year old male, here for right flank pain, and headache, this been going on for the last week, after falling, 8 days ago.  He states he hit his head, is on a blood thinner.  He has significant bruising to the right flank.  We will obtain a CT chest, abdomen, pelvis, as well as blood work for further evaluation.  Percocet for pain  Amount and/or Complexity of Data Reviewed Labs: ordered.    Details: Hemoglobin stable Radiology: ordered.    Details: CT chest, abdomen, pelvis, shows 5 rib fractures.  No other acute findings Discussion of management or test interpretation with external provider(s): Discussed with trauma surgery Dr. Azucena Cecil,  patient's pain is controlled on Tylenol, he declined any Percocets for home.  He is using the incentive spirometer well.  She would like him to follow-up in trauma clinic, in 1 week, for repeat chest x-ray, and further evaluation.  His pain is controlled, he is not splinting, thus he was discharged home.  He did have a healing wound, to his posterior/parietal scalp, and was given a tetanus shot.  No need for stitches or staples as this is healing well at this time.  Risk Prescription drug management.   Final Clinical Impression(s) / ED Diagnoses Final diagnoses:  Closed fracture of multiple ribs of right side, initial encounter  Traumatic injury of head, initial encounter  Fall, initial encounter    Rx / DC Orders ED Discharge Orders     None         Caya Soberanis, Harley Alto, PA 07/18/23 1831    Virgina Norfolk, DO 07/21/23 [phone]

## 2023-07-18 NOTE — ED Notes (Signed)
Patient transported back to room from CT 

## 2023-07-18 NOTE — ED Triage Notes (Addendum)
Patient here POV from Home.  Notes fall that occurred 8 days ago when he was in his bathroom and lost his balance and fell backwards hitting his head on the door frame and injuring his Right Ribs/torso. Some head discomfort as well.   No SOB. No Dizziness. No Confusion. No LOC. Does take [name]. Spoke with provider today who recommended evaluation for CT imaging.  NAD noted during triage. A&Ox4. GCS 15. BIB Wheelchair.

## 2023-07-18 NOTE — ED Notes (Signed)
RT Note: Patient sowed a good effort with the Incentive spirometer.

## 2023-07-18 NOTE — ED Notes (Signed)
Patient transported to CT 

## 2023-07-19 DIAGNOSIS — S2241XA Multiple fractures of ribs, right side, initial encounter for closed fracture: Secondary | ICD-10-CM | POA: Diagnosis not present

## 2023-07-24 ENCOUNTER — Telehealth: Payer: Self-pay

## 2023-07-24 DIAGNOSIS — I4819 Other persistent atrial fibrillation: Secondary | ICD-10-CM

## 2023-07-24 NOTE — Telephone Encounter (Signed)
Spoke with pt and made him aware that his CT has been cancelled for 1/29 due to his GFR. I explained to him that he would be getting a TEE prior to his Ablation on 2/26 with Dr. Nelly Laurence.   He has a f/u appt in clinic with Dr. Nelly Laurence on 1/30 and will get Instruction letter at that time and have labs done after that appt at [facility].

## 2023-07-25 ENCOUNTER — Ambulatory Visit (HOSPITAL_COMMUNITY): Admission: RE | Admit: 2023-07-25 | Payer: Medicare Other | Source: Ambulatory Visit

## 2023-07-26 ENCOUNTER — Ambulatory Visit: Payer: Medicare Other | Attending: Cardiovascular Disease | Admitting: Cardiovascular Disease

## 2023-07-26 ENCOUNTER — Encounter: Payer: Self-pay | Admitting: Cardiovascular Disease

## 2023-07-26 VITALS — BP 102/66 | HR 102 | Ht 69.0 in | Wt 163.8 lb

## 2023-07-26 DIAGNOSIS — I4819 Other persistent atrial fibrillation: Secondary | ICD-10-CM

## 2023-07-26 NOTE — Progress Notes (Signed)
Electrophysiology Office Note:    Date:  07/26/2023   ID:  Deyon, Chizek 08-05-1940, MRN [id number]  PCP:  Jackelyn Poling, DO   [facility] Providers Cardiologist:  Olga Millers, MD Electrophysiologist:  Maurice Small, MD     Referring MD: Jackelyn Poling, DO   History of Present Illness:    Charles [name] is a 83 y.o. male with a hx listed below, significant for paroxysmal atrial fibrillation and tachycardia mediated cardiomyopathy, mitral valve regurgitation, CKD stage III, hypothyroidism, hypertension, sleep apnea referred for arrhythmia management.  He underwent atrial fibrillation ablation in June 2014 by Dr. [name]. He has had about 9 cardioversions over the years. The prior cardioversion was in January, 2023.  He has been maintained on amiodarone but return the office in April 2024 with recurrence of atrial fibrillation, rate controlled he underwent DC cardioversion on April 29. It took him weeks to recover from this episode of AF and cardioversion. He had fluid retention, increased weakness.  He underwent routine screening studies for amiodarone management and was found to have severely reduced DLCO (47%) on pulmonary function tests.  A-fib recurred in August 2024.  He is extremely symptomatic and unable to function normally.  He has fatigue and pretty much just sits in the chair and sleeps.  He underwent attempted DC cardioversion in October 2024 but had recurrence of atrial fibrillation.  He presents today in follow-up prior to planned ablation procedure next month.   Current Medications: Current Meds  Medication Sig   acetaminophen (TYLENOL) 650 MG CR tablet Take 650 mg by mouth as needed.   amiodarone (PACERONE) 200 MG tablet TAKE 1 TABLET BY MOUTH DAILY   amLODipine (NORVASC) 10 MG tablet Take 0.5 tablets (5 mg total) by mouth daily.   Ascorbic Acid (VITAMIN C PO) Take 1 tablet by mouth 2 (two) times daily.   Cholecalciferol (VITAMIN D) 125 MCG  (5000 UT) CAPS Take 5,000 Units by mouth in the morning.   Coenzyme Q10 (COQ10) 100 MG CAPS Take 100 mg by mouth every morning.   doxycycline (VIBRA-TABS) 100 MG tablet Take 1 tablet (100 mg total) by mouth 2 (two) times daily.   edoxaban ([name]) 60 MG TABS tablet Take 30 mg by mouth daily. (Patient taking differently: Take 30 mg by mouth every evening.)   enalapril (VASOTEC) 20 MG tablet TAKE 1/2 TABLET BY MOUTH EVERY NIGHT AT BEDTIME   EQ MAGNESIUM CITRATE PO Take 420 mg by mouth daily at 12 noon.   IRON PO Take 65 mg by mouth daily at 12 noon.   levothyroxine (SYNTHROID) 125 MCG tablet TAKE 1 TABLET BY MOUTH DAILY BEFORE BREAKFAST   metoprolol succinate (TOPROL-XL) 25 MG 24 hr tablet Take 1 tablet (25 mg total) by mouth 2 (two) times daily.   Misc Natural Products (OSTEO BI-FLEX JOINT SHIELD PO) Take 2 tablets by mouth every morning.   Multiple Vitamin (MULTIVITAMIN) tablet Take 1 tablet by mouth daily at 12 noon.   Omega-3 Fatty Acids (FISH OIL) 1200 MG CAPS Take 1,200 mg by mouth 2 (two) times daily.   omeprazole (PRILOSEC) 20 MG capsule Take 20 mg by mouth every Monday, Wednesday, and Friday. In the evening   polyvinyl alcohol (LIQUIFILM TEARS) 1.4 % ophthalmic solution Place 1 drop into both eyes 3 (three) times daily as needed for dry eyes.   torsemide (DEMADEX) 20 MG tablet Take 20 mg by mouth in the morning.   zinc gluconate 50 MG tablet Take 50 mg  by mouth daily at 12 noon.      EKGs/Labs/Other Studies Reviewed Today:    Echocardiogram:  TTE 03/16/2022 EF 55 to 60%, grade 2 diastolic dysfunction, severely dilated left atrium  TTE 11/30/2022 EF 55-60%, severely dilated LA   Monitors:   Stress testing:   Advanced imaging:   EKG:  Last EKG results: today - sinus rhythm, nonspecific ST and T wave abnormality   Recent Labs: 10/19/2022: Magnesium 2.2 10/27/2022: BNP 325.7; TSH 1.600 07/18/2023: ALT 21; BUN 65; Creatinine, Ser 2.77; Hemoglobin 11.1; Platelets 341;  Potassium 4.3; Sodium 140   PFTs 2018 -- DLCO 50% PFTs 2024 -- DLCO 47%  Physical Exam:    VS:  BP 102/66 (BP Location: Left Arm, Patient Position: Sitting, Cuff Size: Normal)   Pulse (!) 102   Ht 5\' 9"  (1.753 m)   Wt 163 lb 12.8 oz (74.3 kg)   SpO2 99%   BMI 24.19 kg/m     Wt Readings from Last 3 Encounters:  07/26/23 163 lb 12.8 oz (74.3 kg)  07/18/23 152 lb (68.9 kg)  05/29/23 168 lb (76.2 kg)     GEN: Well nourished, well developed in no acute distress CARDIAC: RRR, no murmurs, rubs, gallops RESPIRATORY:  Normal work of breathing MUSCULOSKELETAL: no edema    ASSESSMENT & PLAN:    Persistent atrial fibrillation Status post ablation 2014, multiple cardioversions -- recent cardioversions seem to hold him for year. Has had recurrence of A-fib on amiodarone -- maintaining sinus rhythm now on amiodarone Severely dilated LA in September 2023 (4.6 cm) Previously, I did not think he was a candidate for repeat AF ablation and had planned to consider AVJ ablation and pacemaker placement if he has recurrence.  However, now with pulsed field ablation, I do not think the risk of AF ablation is significantly higher than that of AVJ ablation and pacemaker placement.  Due to his age, he is of higher risk than normal for any type of intervention, however. We discussed options for management at length.  I explained that atrial fibrillation ablation does carry risk, and he is a high risk than normal due to his age.  However, his quality of life is currently so low he is eager to proceed. Today (1/30) we again reviewed the risk/benefit of the procedure, and I informed him that there is a significant chance that he will remain in AF. However, he reports that his quality of life in AF is so poor that he would like to proceed, and his wife is in agreement.   Monitoring of high risk medication Recent had TSH - ok PFTs showed severely reduced DLCO (47%).  I found a prior pulmonary function test  from 2019 that showed a DLCO of 50%.  He has been maintained on amiodarone since then with very minimal change in his DLCO. I had a long discussion with the patient and his wife about the seriousness of pulmonary fibrosis and amiodarone --that it can be debilitating and life threatening disease. He also feels that his quality of life will deteriorate severely with atrial fibrillation and would prefer to stay on the medication with ongoing monitoring. Given the patient's advanced age, minimal change in DLCO over 6 years, and paucity of symptoms, I think it reasonable to continue amiodarone for now.  Severe pulmonary hypertension May need to consider as possible contributor to edema. Pt has follow-up in Cardiology clinic  Mitral valve regurgitation Moderate on most recent echo, 03/15/2022  CKD stage IIIb Most recent Cr  2.99 - limits AAD options  Obstructive sleep apnea Adherent to CPAP          Medication Adjustments/Labs and Tests Ordered: Current medicines are reviewed at length with the patient today.  Concerns regarding medicines are outlined above.  Orders Placed This Encounter  Procedures   EKG 12-Lead   No orders of the defined types were placed in this encounter.    Signed, Maurice Small, MD  07/26/2023 9:59 AM    [facility]

## 2023-07-26 NOTE — Patient Instructions (Signed)
Medication Instructions:  Your physician recommends that you continue on your current medications as directed. Please refer to the Current Medication list given to you today. *If you need a refill on your cardiac medications before your next appointment, please call your pharmacy*   Lab Work: CBC and BMET today - [facility] is on the first floor of our building  If you have labs (blood work) drawn today and your tests are completely normal, you will receive your results only by: MyChart Message (if you have MyChart) OR A paper copy in the mail If you have any lab test that is abnormal or we need to change your treatment, we will call you to review the results.   Testing/Procedures: Atrial Fibrillation Ablation - see instruction letter Your physician has recommended that you have an ablation. Catheter ablation is a medical procedure used to treat some cardiac arrhythmias (irregular heartbeats). During catheter ablation, a long, thin, flexible tube is put into a blood vessel in your groin (upper thigh), or neck. This tube is called an ablation catheter. It is then guided to your heart through the blood vessel. Radio frequency waves destroy small areas of heart tissue where abnormal heartbeats may cause an arrhythmia to start. Please see the instruction sheet given to you today.   Follow-Up: At [facility], you and your health needs are our priority.  As part of our continuing mission to provide you with exceptional heart care, we have created designated Provider Care Teams.  These Care Teams include your primary Cardiologist (physician) and Advanced Practice Providers (APPs -  Physician Assistants and Nurse Practitioners) who all work together to provide you with the care you need, when you need it.  We recommend signing up for the patient portal called "MyChart".  Sign up information is provided on this After Visit Summary.  MyChart is used to connect with patients for Virtual Visits  (Telemedicine).  Patients are able to view lab/test results, encounter notes, upcoming appointments, etc.  Non-urgent messages can be sent to your provider as well.   To learn more about what you can do with MyChart, go to [email or web address].    Your next appointment:   We will schedule follow up after your ablation   Provider:   York Pellant, MD    1st Floor: - Lobby - Registration  - Pharmacy  - Lab - Cafe  2nd Floor: - PV Lab - Diagnostic Testing (echo, CT, nuclear med)  3rd Floor: - Vacant  4th Floor: - TCTS (cardiothoracic surgery) - AFib Clinic - Structural Heart Clinic - Vascular Surgery  - Vascular Ultrasound  5th Floor: - [facility] (general and EP) - Clinical Pharmacy for coumadin, hypertension, lipid, weight-loss medications, and med management appointments    Valet parking services will be available as well.

## 2023-07-26 NOTE — H&P (View-Only) (Signed)
 Electrophysiology Office Note:    Date:  07/26/2023   ID:  Charles [name], Chizek 08-05-1940, MRN [id number]  PCP:  Jackelyn Poling, DO   [facility] Providers Cardiologist:  Olga Millers, MD Electrophysiologist:  Maurice Small, MD     Referring MD: Jackelyn Poling, DO   History of Present Illness:    Charles [name] is a 83 y.o. male with a hx listed below, significant for paroxysmal atrial fibrillation and tachycardia mediated cardiomyopathy, mitral valve regurgitation, CKD stage III, hypothyroidism, hypertension, sleep apnea referred for arrhythmia management.  He underwent atrial fibrillation ablation in June 2014 by Dr. [name]. He has had about 9 cardioversions over the years. The prior cardioversion was in January, 2023.  He has been maintained on amiodarone but return the office in April 2024 with recurrence of atrial fibrillation, rate controlled he underwent DC cardioversion on April 29. It took him weeks to recover from this episode of AF and cardioversion. He had fluid retention, increased weakness.  He underwent routine screening studies for amiodarone management and was found to have severely reduced DLCO (47%) on pulmonary function tests.  A-fib recurred in August 2024.  He is extremely symptomatic and unable to function normally.  He has fatigue and pretty much just sits in the chair and sleeps.  He underwent attempted DC cardioversion in October 2024 but had recurrence of atrial fibrillation.  He presents today in follow-up prior to planned ablation procedure next month.   Current Medications: Current Meds  Medication Sig   acetaminophen (TYLENOL) 650 MG CR tablet Take 650 mg by mouth as needed.   amiodarone (PACERONE) 200 MG tablet TAKE 1 TABLET BY MOUTH DAILY   amLODipine (NORVASC) 10 MG tablet Take 0.5 tablets (5 mg total) by mouth daily.   Ascorbic Acid (VITAMIN C PO) Take 1 tablet by mouth 2 (two) times daily.   Cholecalciferol (VITAMIN D) 125 MCG  (5000 UT) CAPS Take 5,000 Units by mouth in the morning.   Coenzyme Q10 (COQ10) 100 MG CAPS Take 100 mg by mouth every morning.   doxycycline (VIBRA-TABS) 100 MG tablet Take 1 tablet (100 mg total) by mouth 2 (two) times daily.   edoxaban ([name]) 60 MG TABS tablet Take 30 mg by mouth daily. (Patient taking differently: Take 30 mg by mouth every evening.)   enalapril (VASOTEC) 20 MG tablet TAKE 1/2 TABLET BY MOUTH EVERY NIGHT AT BEDTIME   EQ MAGNESIUM CITRATE PO Take 420 mg by mouth daily at 12 noon.   IRON PO Take 65 mg by mouth daily at 12 noon.   levothyroxine (SYNTHROID) 125 MCG tablet TAKE 1 TABLET BY MOUTH DAILY BEFORE BREAKFAST   metoprolol succinate (TOPROL-XL) 25 MG 24 hr tablet Take 1 tablet (25 mg total) by mouth 2 (two) times daily.   Misc Natural Products (OSTEO BI-FLEX JOINT SHIELD PO) Take 2 tablets by mouth every morning.   Multiple Vitamin (MULTIVITAMIN) tablet Take 1 tablet by mouth daily at 12 noon.   Omega-3 Fatty Acids (FISH OIL) 1200 MG CAPS Take 1,200 mg by mouth 2 (two) times daily.   omeprazole (PRILOSEC) 20 MG capsule Take 20 mg by mouth every Monday, Wednesday, and Friday. In the evening   polyvinyl alcohol (LIQUIFILM TEARS) 1.4 % ophthalmic solution Place 1 drop into both eyes 3 (three) times daily as needed for dry eyes.   torsemide (DEMADEX) 20 MG tablet Take 20 mg by mouth in the morning.   zinc gluconate 50 MG tablet Take 50 mg  by mouth daily at 12 noon.      EKGs/Labs/Other Studies Reviewed Today:    Echocardiogram:  TTE 03/16/2022 EF 55 to 60%, grade 2 diastolic dysfunction, severely dilated left atrium  TTE 11/30/2022 EF 55-60%, severely dilated LA   Monitors:   Stress testing:   Advanced imaging:   EKG:  Last EKG results: today - sinus rhythm, nonspecific ST and T wave abnormality   Recent Labs: 10/19/2022: Magnesium 2.2 10/27/2022: BNP 325.7; TSH 1.600 07/18/2023: ALT 21; BUN 65; Creatinine, Ser 2.77; Hemoglobin 11.1; Platelets 341;  Potassium 4.3; Sodium 140   PFTs 2018 -- DLCO 50% PFTs 2024 -- DLCO 47%  Physical Exam:    VS:  BP 102/66 (BP Location: Left Arm, Patient Position: Sitting, Cuff Size: Normal)   Pulse (!) 102   Ht 5\' 9"  (1.753 m)   Wt 163 lb 12.8 oz (74.3 kg)   SpO2 99%   BMI 24.19 kg/m     Wt Readings from Last 3 Encounters:  07/26/23 163 lb 12.8 oz (74.3 kg)  07/18/23 152 lb (68.9 kg)  05/29/23 168 lb (76.2 kg)     GEN: Well nourished, well developed in no acute distress CARDIAC: RRR, no murmurs, rubs, gallops RESPIRATORY:  Normal work of breathing MUSCULOSKELETAL: no edema    ASSESSMENT & PLAN:    Persistent atrial fibrillation Status post ablation 2014, multiple cardioversions -- recent cardioversions seem to hold him for year. Has had recurrence of A-fib on amiodarone -- maintaining sinus rhythm now on amiodarone Severely dilated LA in September 2023 (4.6 cm) Previously, I did not think he was a candidate for repeat AF ablation and had planned to consider AVJ ablation and pacemaker placement if he has recurrence.  However, now with pulsed field ablation, I do not think the risk of AF ablation is significantly higher than that of AVJ ablation and pacemaker placement.  Due to his age, he is of higher risk than normal for any type of intervention, however. We discussed options for management at length.  I explained that atrial fibrillation ablation does carry risk, and he is a high risk than normal due to his age.  However, his quality of life is currently so low he is eager to proceed. Today (1/30) we again reviewed the risk/benefit of the procedure, and I informed him that there is a significant chance that he will remain in AF. However, he reports that his quality of life in AF is so poor that he would like to proceed, and his wife is in agreement.   Monitoring of high risk medication Recent had TSH - ok PFTs showed severely reduced DLCO (47%).  I found a prior pulmonary function test  from 2019 that showed a DLCO of 50%.  He has been maintained on amiodarone since then with very minimal change in his DLCO. I had a long discussion with the patient and his wife about the seriousness of pulmonary fibrosis and amiodarone --that it can be debilitating and life threatening disease. He also feels that his quality of life will deteriorate severely with atrial fibrillation and would prefer to stay on the medication with ongoing monitoring. Given the patient's advanced age, minimal change in DLCO over 6 years, and paucity of symptoms, I think it reasonable to continue amiodarone for now.  Severe pulmonary hypertension May need to consider as possible contributor to edema. Pt has follow-up in Cardiology clinic  Mitral valve regurgitation Moderate on most recent echo, 03/15/2022  CKD stage IIIb Most recent Cr  2.99 - limits AAD options  Obstructive sleep apnea Adherent to CPAP          Medication Adjustments/Labs and Tests Ordered: Current medicines are reviewed at length with the patient today.  Concerns regarding medicines are outlined above.  Orders Placed This Encounter  Procedures   EKG 12-Lead   No orders of the defined types were placed in this encounter.    Signed, Maurice Small, MD  07/26/2023 9:59 AM    [facility]

## 2023-07-27 ENCOUNTER — Ambulatory Visit (INDEPENDENT_AMBULATORY_CARE_PROVIDER_SITE_OTHER): Payer: Medicare Other

## 2023-07-27 ENCOUNTER — Ambulatory Visit: Payer: Medicare Other | Admitting: Podiatry

## 2023-07-27 ENCOUNTER — Encounter: Payer: Self-pay | Admitting: Podiatry

## 2023-07-27 ENCOUNTER — Other Ambulatory Visit: Payer: Self-pay | Admitting: Podiatry

## 2023-07-27 DIAGNOSIS — I999 Unspecified disorder of circulatory system: Secondary | ICD-10-CM

## 2023-07-27 DIAGNOSIS — M778 Other enthesopathies, not elsewhere classified: Secondary | ICD-10-CM | POA: Diagnosis not present

## 2023-07-27 DIAGNOSIS — L03119 Cellulitis of unspecified part of limb: Secondary | ICD-10-CM

## 2023-07-27 DIAGNOSIS — L97522 Non-pressure chronic ulcer of other part of left foot with fat layer exposed: Secondary | ICD-10-CM

## 2023-07-27 DIAGNOSIS — L02619 Cutaneous abscess of unspecified foot: Secondary | ICD-10-CM

## 2023-07-27 DIAGNOSIS — I4819 Other persistent atrial fibrillation: Secondary | ICD-10-CM | POA: Diagnosis not present

## 2023-07-27 MED ORDER — DOXYCYCLINE HYCLATE 100 MG PO TABS: 100.0000 mg | ORAL_TABLET | Freq: Two times a day (BID) | ORAL | 1 refills | Status: DC

## 2023-07-27 MED ORDER — SANTYL 250 UNIT/GM EX OINT: 1.0000 | TOPICAL_OINTMENT | Freq: Every day | CUTANEOUS | 0 refills | Status: DC

## 2023-07-27 NOTE — Progress Notes (Signed)
Subjective:   Patient ID: Charles [name], male   DOB: 83 y.o.   MRN: [id number]   HPI Patient states I am really not having pain but they wanted to look at this again and I am not wearing the surgical shoe that was dispensed been wearing a regular shoe.  Presents with caregiver F2 neurovascular status unchanged with last WBC back into a normal range.  Patient does have area of breakdown of the left medial first metatarsal head measuring approximately 2 cm x 2 cm with fibrous tissue formation within it no tendon or bone exposure.  Patient has no systemic signs of infection   ROS      Objective:  Physical Exam  Difficult area here with patient having vascular disease breakdown of tissue so far not showing indications of healing the patient has not been compliant in wearing the surgical shoe or offloading with the area measuring 2 x 2 cm with fibro formation     Assessment:  The left first metatarsal head that has not responded so far     Plan:  H&P x-ray reviewed and at this point I am sending for vascular evaluation and I am going to start Santyl with wet dressings and surgical shoe that he has to wear at all times and I placed him back on antibiotics doxycycline.  Patient may require bone resection and this is most likely gena be necessary but first I want to find out circulatory status and I gave him strict instructions of any increased redness proximal redness or systemic signs of infection were to occur is to go straight to the emergency room and he does understand he is at high risk of amputation  X-rays do indicate lysis around the first metatarsal head difficult to say how much progression over the last few weeks but definitely present

## 2023-07-28 LAB — BASIC METABOLIC PANEL
BUN/Creatinine Ratio: 18 (ref 10–24)
BUN: 49 mg/dL — ABNORMAL HIGH (ref 8–27)
CO2: 25 mmol/L (ref 20–29)
Calcium: 9.9 mg/dL (ref 8.6–10.2)
Chloride: 101 mmol/L (ref 96–106)
Creatinine, Ser: 2.79 mg/dL — ABNORMAL HIGH (ref 0.76–1.27)
Glucose: 106 mg/dL — ABNORMAL HIGH (ref 70–99)
Potassium: 4.7 mmol/L (ref 3.5–5.2)
Sodium: 141 mmol/L (ref 134–144)
eGFR: 22 mL/min/{1.73_m2} — ABNORMAL LOW (ref >59)

## 2023-07-28 LAB — CBC
Hematocrit: 32.6 % — ABNORMAL LOW (ref 37.5–51.0)
Hemoglobin: 10.6 g/dL — ABNORMAL LOW (ref 13.0–17.7)
MCH: 30.4 pg (ref 26.6–33.0)
MCHC: 32.5 g/dL (ref 31.5–35.7)
MCV: 93 fL (ref 79–97)
Platelets: 258 10*3/uL (ref 150–450)
RBC: 3.49 x10E6/uL — ABNORMAL LOW (ref 4.14–5.80)
RDW: 12.5 % (ref 11.6–15.4)
WBC: 9.3 10*3/uL (ref 3.4–10.8)

## 2023-07-30 ENCOUNTER — Other Ambulatory Visit: Payer: Self-pay | Admitting: Podiatry

## 2023-08-03 ENCOUNTER — Ambulatory Visit: Payer: Medicare Other | Admitting: Podiatry

## 2023-08-07 ENCOUNTER — Encounter: Payer: Self-pay | Admitting: Cardiovascular Disease

## 2023-08-08 ENCOUNTER — Ambulatory Visit (HOSPITAL_COMMUNITY)
Admission: RE | Admit: 2023-08-08 | Discharge: 2023-08-08 | Disposition: A | Discharge status: Home / Self Care | Payer: Medicare Other | Source: Ambulatory Visit | Attending: Podiatry | Admitting: Podiatry

## 2023-08-08 DIAGNOSIS — I999 Unspecified disorder of circulatory system: Secondary | ICD-10-CM | POA: Insufficient documentation

## 2023-08-08 DIAGNOSIS — L97522 Non-pressure chronic ulcer of other part of left foot with fat layer exposed: Secondary | ICD-10-CM | POA: Insufficient documentation

## 2023-08-17 ENCOUNTER — Other Ambulatory Visit: Payer: Self-pay | Admitting: Cardiology

## 2023-08-17 DIAGNOSIS — E039 Hypothyroidism, unspecified: Secondary | ICD-10-CM

## 2023-08-22 ENCOUNTER — Encounter (HOSPITAL_COMMUNITY): Payer: Self-pay | Admitting: Cardiovascular Disease

## 2023-08-22 ENCOUNTER — Other Ambulatory Visit: Payer: Self-pay

## 2023-08-22 ENCOUNTER — Ambulatory Visit (HOSPITAL_BASED_OUTPATIENT_CLINIC_OR_DEPARTMENT_OTHER): Payer: Self-pay | Admitting: Anesthesiology

## 2023-08-22 ENCOUNTER — Ambulatory Visit (HOSPITAL_COMMUNITY): Payer: Self-pay | Admitting: Anesthesiology

## 2023-08-22 ENCOUNTER — Ambulatory Visit (HOSPITAL_COMMUNITY)
Admission: RE | Disposition: A | Discharge status: Home / Self Care | Payer: Medicare Other | Source: Home / Self Care | Attending: Cardiovascular Disease

## 2023-08-22 ENCOUNTER — Ambulatory Visit (HOSPITAL_COMMUNITY): Payer: Medicare Other

## 2023-08-22 ENCOUNTER — Ambulatory Visit (HOSPITAL_COMMUNITY)
Admission: RE | Admit: 2023-08-22 | Discharge: 2023-08-22 | Disposition: A | Discharge status: Home / Self Care | Payer: Medicare Other | Attending: Cardiovascular Disease | Admitting: Cardiovascular Disease

## 2023-08-22 DIAGNOSIS — I2510 Atherosclerotic heart disease of native coronary artery without angina pectoris: Secondary | ICD-10-CM | POA: Insufficient documentation

## 2023-08-22 DIAGNOSIS — I4891 Unspecified atrial fibrillation: Secondary | ICD-10-CM | POA: Diagnosis not present

## 2023-08-22 DIAGNOSIS — I361 Nonrheumatic tricuspid (valve) insufficiency: Secondary | ICD-10-CM | POA: Diagnosis not present

## 2023-08-22 DIAGNOSIS — N1832 Chronic kidney disease, stage 3b: Secondary | ICD-10-CM | POA: Insufficient documentation

## 2023-08-22 DIAGNOSIS — I083 Combined rheumatic disorders of mitral, aortic and tricuspid valves: Secondary | ICD-10-CM | POA: Insufficient documentation

## 2023-08-22 DIAGNOSIS — N183 Chronic kidney disease, stage 3 unspecified: Secondary | ICD-10-CM | POA: Diagnosis not present

## 2023-08-22 DIAGNOSIS — Z79899 Other long term (current) drug therapy: Secondary | ICD-10-CM | POA: Insufficient documentation

## 2023-08-22 DIAGNOSIS — G4733 Obstructive sleep apnea (adult) (pediatric): Secondary | ICD-10-CM | POA: Diagnosis not present

## 2023-08-22 DIAGNOSIS — I4811 Longstanding persistent atrial fibrillation: Secondary | ICD-10-CM | POA: Diagnosis not present

## 2023-08-22 DIAGNOSIS — I340 Nonrheumatic mitral (valve) insufficiency: Secondary | ICD-10-CM | POA: Diagnosis not present

## 2023-08-22 DIAGNOSIS — I4819 Other persistent atrial fibrillation: Secondary | ICD-10-CM

## 2023-08-22 DIAGNOSIS — I130 Hypertensive heart and chronic kidney disease with heart failure and stage 1 through stage 4 chronic kidney disease, or unspecified chronic kidney disease: Secondary | ICD-10-CM | POA: Diagnosis not present

## 2023-08-22 DIAGNOSIS — I429 Cardiomyopathy, unspecified: Secondary | ICD-10-CM | POA: Insufficient documentation

## 2023-08-22 DIAGNOSIS — I272 Pulmonary hypertension, unspecified: Secondary | ICD-10-CM | POA: Diagnosis not present

## 2023-08-22 DIAGNOSIS — E039 Hypothyroidism, unspecified: Secondary | ICD-10-CM | POA: Insufficient documentation

## 2023-08-22 DIAGNOSIS — I509 Heart failure, unspecified: Secondary | ICD-10-CM | POA: Insufficient documentation

## 2023-08-22 DIAGNOSIS — I110 Hypertensive heart disease with heart failure: Secondary | ICD-10-CM | POA: Diagnosis not present

## 2023-08-22 HISTORY — PX: ATRIAL FIBRILLATION ABLATION: EP1191

## 2023-08-22 HISTORY — PX: TRANSESOPHAGEAL ECHOCARDIOGRAM (CATH LAB): EP1270

## 2023-08-22 LAB — POCT ACTIVATED CLOTTING TIME: Activated Clotting Time: 268 s

## 2023-08-22 LAB — ECHO TEE

## 2023-08-22 SURGERY — ATRIAL FIBRILLATION ABLATION
Anesthesia: General

## 2023-08-22 MED ORDER — SODIUM CHLORIDE 0.9 % IV SOLN: 250.0000 mL | INTRAVENOUS | Status: DC | PRN

## 2023-08-22 MED ORDER — EPHEDRINE SULFATE (PRESSORS) 50 MG/ML IJ SOLN
INTRAMUSCULAR | Status: DC | PRN
Start: 2023-08-22 — End: 2023-08-22
  Administered 2023-08-22: 5 mg via INTRAVENOUS

## 2023-08-22 MED ORDER — FENTANYL CITRATE (PF) 100 MCG/2ML IJ SOLN
INTRAMUSCULAR | Status: AC
Start: 2023-08-22 — End: ?
  Filled 2023-08-22: qty 2

## 2023-08-22 MED ORDER — SODIUM CHLORIDE 0.9% FLUSH: 3.0000 mL | Freq: Two times a day (BID) | INTRAVENOUS | Status: DC

## 2023-08-22 MED ORDER — HEPARIN (PORCINE) IN NACL 1000-0.9 UT/500ML-% IV SOLN
INTRAVENOUS | Status: DC | PRN
  Administered 2023-08-22 (×3): 500 mL

## 2023-08-22 MED ORDER — PHENYLEPHRINE HCL-NACL 20-0.9 MG/250ML-% IV SOLN
INTRAVENOUS | Status: DC | PRN
  Administered 2023-08-22: 20 ug/min via INTRAVENOUS

## 2023-08-22 MED ORDER — ONDANSETRON HCL 4 MG/2ML IJ SOLN: 4.0000 mg | Freq: Four times a day (QID) | INTRAMUSCULAR | Status: DC | PRN

## 2023-08-22 MED ORDER — ACETAMINOPHEN 500 MG PO TABS
1000.0000 mg | ORAL_TABLET | Freq: Once | ORAL | Status: AC
  Administered 2023-08-22: 1000 mg via ORAL
  Filled 2023-08-22: qty 2

## 2023-08-22 MED ORDER — ATROPINE SULFATE 1 MG/ML IV SOLN
INTRAVENOUS | Status: DC | PRN
  Administered 2023-08-22: 1 mg via INTRAVENOUS

## 2023-08-22 MED ORDER — SODIUM CHLORIDE 0.9 % IV BOLUS: 500.0000 mL | Freq: Once | INTRAVENOUS | Status: DC

## 2023-08-22 MED ORDER — LIDOCAINE 2% (20 MG/ML) 5 ML SYRINGE
INTRAMUSCULAR | Status: DC | PRN
  Administered 2023-08-22: 80 mg via INTRAVENOUS

## 2023-08-22 MED ORDER — FENTANYL CITRATE (PF) 250 MCG/5ML IJ SOLN
INTRAMUSCULAR | Status: DC | PRN
Start: 2023-08-22 — End: 2023-08-22
  Administered 2023-08-22: 50 ug via INTRAVENOUS

## 2023-08-22 MED ORDER — PROPOFOL 500 MG/50ML IV EMUL
INTRAVENOUS | Status: DC | PRN
  Administered 2023-08-22: 55 ug/kg/min via INTRAVENOUS

## 2023-08-22 MED ORDER — DEXAMETHASONE SODIUM PHOSPHATE 10 MG/ML IJ SOLN
INTRAMUSCULAR | Status: DC | PRN
  Administered 2023-08-22: 10 mg via INTRAVENOUS

## 2023-08-22 MED ORDER — ROCURONIUM BROMIDE 10 MG/ML (PF) SYRINGE
PREFILLED_SYRINGE | INTRAVENOUS | Status: DC | PRN
  Administered 2023-08-22: 30 mg via INTRAVENOUS
  Administered 2023-08-22: 60 mg via INTRAVENOUS

## 2023-08-22 MED ORDER — PHENYLEPHRINE 80 MCG/ML (10ML) SYRINGE FOR IV PUSH (FOR BLOOD PRESSURE SUPPORT)
PREFILLED_SYRINGE | INTRAVENOUS | Status: DC | PRN
  Administered 2023-08-22 (×3): 120 ug via INTRAVENOUS

## 2023-08-22 MED ORDER — SUGAMMADEX SODIUM 200 MG/2ML IV SOLN
INTRAVENOUS | Status: DC | PRN
Start: 2023-08-22 — End: 2023-08-22
  Administered 2023-08-22: 200 mg via INTRAVENOUS

## 2023-08-22 MED ORDER — ACETAMINOPHEN 325 MG PO TABS: 650.0000 mg | ORAL_TABLET | ORAL | Status: DC | PRN

## 2023-08-22 MED ORDER — SODIUM CHLORIDE 0.9 % IV SOLN: INTRAVENOUS | Status: DC

## 2023-08-22 MED ORDER — PROTAMINE SULFATE 10 MG/ML IV SOLN
INTRAVENOUS | Status: DC | PRN
  Administered 2023-08-22: 50 mg via INTRAVENOUS

## 2023-08-22 MED ORDER — PROPOFOL 10 MG/ML IV BOLUS
INTRAVENOUS | Status: DC | PRN
  Administered 2023-08-22: 20 mg via INTRAVENOUS
  Administered 2023-08-22: 90 mg via INTRAVENOUS
  Administered 2023-08-22 (×2): 20 mg via INTRAVENOUS

## 2023-08-22 MED ORDER — SODIUM CHLORIDE 0.9% FLUSH: 3.0000 mL | INTRAVENOUS | Status: DC | PRN

## 2023-08-22 MED ORDER — ONDANSETRON HCL 4 MG/2ML IJ SOLN
INTRAMUSCULAR | Status: DC | PRN
  Administered 2023-08-22: 4 mg via INTRAVENOUS

## 2023-08-22 MED ORDER — HEPARIN SODIUM (PORCINE) 1000 UNIT/ML IJ SOLN
INTRAMUSCULAR | Status: DC | PRN
  Administered 2023-08-22: 2000 [IU] via INTRAVENOUS
  Administered 2023-08-22: 14000 [IU] via INTRAVENOUS

## 2023-08-22 SURGICAL SUPPLY — 22 items
BAG SNAP BAND KOVER 36X36 (MISCELLANEOUS) IMPLANT
BLANKET WARM UNDERBOD FULL ACC (MISCELLANEOUS) ×1 IMPLANT
CABLE PFA RX CATH CONN (CABLE) IMPLANT
CATH 8FR REPROCESSED SOUNDSTAR (CATHETERS) ×1 IMPLANT
CATH 8FR SOUNDSTAR REPROCESSED (CATHETERS) IMPLANT
CATH FARAWAVE ABLATION 31 (CATHETERS) IMPLANT
CATH OCTARAY 2.0 F 3-3-3-3-3 (CATHETERS) IMPLANT
CATH WEBSTER BI DIR CS D-F CRV (CATHETERS) IMPLANT
CLOSURE PERCLOSE PROSTYLE (VASCULAR PRODUCTS) IMPLANT
COVER SWIFTLINK CONNECTOR (BAG) ×1 IMPLANT
DEVICE CLOSURE MYNXGRIP 6/7F (Vascular Products) IMPLANT
DILATOR VESSEL 38 20CM 16FR (INTRODUCER) IMPLANT
GUIDEWIRE INQWIRE 1.5J.035X260 (WIRE) IMPLANT
INQWIRE 1.5J .035X260CM (WIRE) ×1 IMPLANT
KIT VERSACROSS CNCT FARADRIVE (KITS) IMPLANT
PACK EP LF (CUSTOM PROCEDURE TRAY) ×1 IMPLANT
PAD DEFIB RADIO PHYSIO CONN (PAD) ×1 IMPLANT
PATCH CARTO3 (PAD) IMPLANT
SHEATH FARADRIVE STEERABLE (SHEATH) IMPLANT
SHEATH PINNACLE 8F 10CM (SHEATH) IMPLANT
SHEATH PINNACLE 9F 10CM (SHEATH) IMPLANT
SHEATH PROBE COVER 6X72 (BAG) IMPLANT

## 2023-08-22 NOTE — Discharge Instructions (Signed)

## 2023-08-22 NOTE — Progress Notes (Signed)
 BP soft. Mentation is excellent. Dr. Nelly Laurence called; 500cc fluid bolus ordered and started.

## 2023-08-22 NOTE — Interval H&P Note (Signed)
 History and Physical Interval Note:  08/22/2023 9:27 AM  Charles [name]  has presented today for surgery, with the diagnosis of afib.  The various methods of treatment have been discussed with the patient and family. After consideration of risks, benefits and other options for treatment, the patient has consented to  Procedure(s): ATRIAL FIBRILLATION ABLATION (N/A) TRANSESOPHAGEAL ECHOCARDIOGRAM (N/A) as a surgical intervention.  The patient's history has been reviewed, patient examined, no change in status, stable for surgery.  I have reviewed the patient's chart and labs.  Questions were answered to the patient's satisfaction.    I reviewed the patient's CT and labs. There was no LAA thrombus. he  has not missed any doses of anticoagulation, and he took his dose last night. There have been no changes in the patient's diagnoses, medications, or condition since our recent clinic visit.   Charles [name]

## 2023-08-22 NOTE — Anesthesia Preprocedure Evaluation (Signed)
 Anesthesia Evaluation  Patient identified by MRN, date of birth, ID band Patient awake    Reviewed: Allergy & Precautions, NPO status , Patient's Chart, lab work & pertinent test results, reviewed documented beta blocker date and time   Airway Mallampati: II  TM Distance: >3 FB Neck ROM: Full    Dental  (+) Teeth Intact, Dental Advisory Given   Pulmonary sleep apnea and Continuous Positive Airway Pressure Ventilation    Pulmonary exam normal breath sounds clear to auscultation       Cardiovascular hypertension, Pt. on medications and Pt. on home beta blockers + CAD and +CHF  Normal cardiovascular exam+ dysrhythmias Atrial Fibrillation + Valvular Problems/Murmurs MR  Rhythm:Regular Rate:Normal  Echo 11/30/22: 1. Left ventricular ejection fraction, by estimation, is 55 to 60%. Left  ventricular ejection fraction by 3D volume is 59 %. The left ventricle has  normal function. The left ventricle has no regional wall motion  abnormalities. Left ventricular diastolic   parameters were normal.   2. Right ventricular systolic function is normal. The right ventricular  size is normal. There is moderately elevated pulmonary artery systolic  pressure. The estimated right ventricular systolic pressure is 45.5 mmHg.   3. Left atrial size was severely dilated.   4. Right atrial size was mildly dilated.   5. The mitral valve is degenerative. Mild to moderate mitral valve  regurgitation.   6. The tricuspid valve is degenerative. Tricuspid valve regurgitation is  moderate to severe.   7. The aortic valve is tricuspid. Aortic valve regurgitation is trivial.  Aortic valve sclerosis/calcification is present, without any evidence of  aortic stenosis.   8. The inferior vena cava is normal in size with greater than 50%  respiratory variability, suggesting right atrial pressure of 3 mmHg.      Neuro/Psych negative neurological ROS      GI/Hepatic Neg liver ROS,GERD  ,,  Endo/Other  Hypothyroidism    Renal/GU Renal InsufficiencyRenal disease     Musculoskeletal  (+) Arthritis ,    Abdominal   Peds  Hematology negative hematology ROS (+)   Anesthesia Other Findings Day of surgery medications reviewed with the patient.  Reproductive/Obstetrics                              Anesthesia Physical Anesthesia Plan  ASA: 3  Anesthesia Plan: General   Post-op Pain Management: Tylenol PO (pre-op)*   Induction: Intravenous  PONV Risk Score and Plan: 2 and Dexamethasone and Ondansetron  Airway Management Planned: Oral ETT  Additional Equipment:   Intra-op Plan:   Post-operative Plan: Extubation in OR  Informed Consent: I have reviewed the patients History and Physical, chart, labs and discussed the procedure including the risks, benefits and alternatives for the proposed anesthesia with the patient or authorized representative who has indicated his/her understanding and acceptance.     Dental advisory given  Plan Discussed with: CRNA  Anesthesia Plan Comments:          Anesthesia Quick Evaluation

## 2023-08-22 NOTE — Transfer of Care (Signed)
 Immediate Anesthesia Transfer of Care Note  Patient: Charles [name]  Procedure(s) Performed: ATRIAL FIBRILLATION ABLATION TRANSESOPHAGEAL ECHOCARDIOGRAM  Patient Location: PACU and Cath Lab  Anesthesia Type:General  Level of Consciousness: awake and alert   Airway & Oxygen Therapy: Patient Spontanous Breathing and Patient connected to nasal cannula oxygen  Post-op Assessment: Report given to RN and Post -op Vital signs reviewed and stable  Post vital signs: stable  Last Vitals:  Vitals Value Taken Time  BP    Temp    Pulse    Resp    SpO2      Last Pain:  Vitals:   08/22/23 0919  TempSrc: Oral  PainSc:          Complications: No notable events documented.

## 2023-08-22 NOTE — Anesthesia Procedure Notes (Signed)
 Procedure Name: Intubation Date/Time: 08/22/2023 9:59 AM  Performed by: Vena Austria, CRNAPre-anesthesia Checklist: Timeout performed, Patient being monitored, Suction available, Emergency Drugs available and Patient identified Patient Re-evaluated:Patient Re-evaluated prior to induction Oxygen Delivery Method: Circle system utilized Preoxygenation: Pre-oxygenation with 100% oxygen Induction Type: IV induction Ventilation: Mask ventilation without difficulty Laryngoscope Size: McGrath and 3 Grade View: Grade I Tube type: Oral Tube size: 7.0 mm Number of attempts: 1 Airway Equipment and Method: Stylet and Video-laryngoscopy Placement Confirmation: ETT inserted through vocal cords under direct vision, positive ETCO2, CO2 detector and breath sounds checked- equal and bilateral Secured at: 21 cm Tube secured with: Tape Dental Injury: Teeth and Oropharynx as per pre-operative assessment

## 2023-08-22 NOTE — Anesthesia Postprocedure Evaluation (Signed)
 Anesthesia Post Note  Patient: Charles [name]  Procedure(s) Performed: ATRIAL FIBRILLATION ABLATION TRANSESOPHAGEAL ECHOCARDIOGRAM     Patient location during evaluation: Cath Lab Anesthesia Type: General Level of consciousness: awake and alert Pain management: pain level controlled Vital Signs Assessment: post-procedure vital signs reviewed and stable Respiratory status: spontaneous breathing, nonlabored ventilation, respiratory function stable and patient connected to nasal cannula oxygen Cardiovascular status: blood pressure returned to baseline and stable Postop Assessment: no apparent nausea or vomiting Anesthetic complications: no   No notable events documented.  Last Vitals:  Vitals:   08/22/23 1400 08/22/23 1430  BP: (!) 106/55 (!) 112/54  Pulse: (!) 53 (!) 55  Resp: 16 17  Temp:    SpO2: 100% 99%    Last Pain:  Vitals:   08/22/23 1325  TempSrc:   PainSc: 0-No pain                 Collene Schlichter

## 2023-08-23 ENCOUNTER — Telehealth (HOSPITAL_COMMUNITY): Payer: Self-pay

## 2023-08-23 ENCOUNTER — Encounter: Payer: Self-pay | Admitting: Emergency Medicine

## 2023-08-23 MED FILL — Fentanyl Citrate Preservative Free (PF) Inj 100 MCG/2ML: INTRAMUSCULAR | Qty: 1 | Status: AC

## 2023-08-23 NOTE — Telephone Encounter (Signed)
 Spoke with patient to complete post procedure follow up call.  Patient reports no complications with groin sites.   Instructions reviewed with patient:  Remove large bandage at puncture site after 24 hours. It is normal to have bruising, tenderness and a pea or marble sized lump/knot at the groin site which can take up to three months to resolve.  Get help right away if you notice sudden swelling at the puncture site.  Check your puncture site every day for signs of infection: fever, redness, swelling, pus drainage, warmth, foul odor or excessive pain. If this occurs, please call the office at [phone], to speak with the nurse. Get help right away if your puncture site is bleeding and the bleeding does not stop after applying firm pressure to the area.  You may continue to have skipped beats/ atrial fibrillation during the first several months after your procedure.  It is very important not to miss any doses of your blood thinner [name]. Patient restarted taking this medication on yesterday, 08/22/23.   You will follow up with the Afib clinic on 09/19/23 and follow up with the APP on 11/20/23.   Patient verbalized understanding to all instructions provided.

## 2023-08-27 ENCOUNTER — Telehealth: Payer: Self-pay

## 2023-08-27 ENCOUNTER — Encounter: Payer: Self-pay | Admitting: Cardiovascular Disease

## 2023-08-27 NOTE — Telephone Encounter (Signed)
 Spoke with patient, he was made aware of being back into AF by his blood pressure monitor. Patient states his heart rates have been well controlled and confirmed still taking medication as prescribed. Patient states both groin sites are doing well, denies any drainage, redness, or warmth. Informed patient that breakthrough episodes can be expected up to 3 months after ablation. Patient thankful for the returned call, no further needs at this time

## 2023-09-04 DIAGNOSIS — N184 Chronic kidney disease, stage 4 (severe): Secondary | ICD-10-CM | POA: Diagnosis not present

## 2023-09-11 DIAGNOSIS — I129 Hypertensive chronic kidney disease with stage 1 through stage 4 chronic kidney disease, or unspecified chronic kidney disease: Secondary | ICD-10-CM | POA: Diagnosis not present

## 2023-09-11 DIAGNOSIS — R3129 Other microscopic hematuria: Secondary | ICD-10-CM | POA: Diagnosis not present

## 2023-09-11 DIAGNOSIS — D631 Anemia in chronic kidney disease: Secondary | ICD-10-CM | POA: Diagnosis not present

## 2023-09-11 DIAGNOSIS — N184 Chronic kidney disease, stage 4 (severe): Secondary | ICD-10-CM | POA: Diagnosis not present

## 2023-09-11 DIAGNOSIS — N2581 Secondary hyperparathyroidism of renal origin: Secondary | ICD-10-CM | POA: Diagnosis not present

## 2023-09-12 ENCOUNTER — Ambulatory Visit (INDEPENDENT_AMBULATORY_CARE_PROVIDER_SITE_OTHER)

## 2023-09-12 ENCOUNTER — Encounter: Payer: Self-pay | Admitting: Podiatry

## 2023-09-12 ENCOUNTER — Ambulatory Visit: Admitting: Podiatry

## 2023-09-12 DIAGNOSIS — L97522 Non-pressure chronic ulcer of other part of left foot with fat layer exposed: Secondary | ICD-10-CM

## 2023-09-12 DIAGNOSIS — M86172 Other acute osteomyelitis, left ankle and foot: Secondary | ICD-10-CM | POA: Diagnosis not present

## 2023-09-12 MED ORDER — DOXYCYCLINE HYCLATE 100 MG PO TABS: 100.0000 mg | ORAL_TABLET | Freq: Two times a day (BID) | ORAL | 1 refills | Status: DC

## 2023-09-12 NOTE — Progress Notes (Signed)
 Subjective:   Patient ID: Charles [name], male   DOB: 83 y.o.   MRN: [id number]   HPI Patient states it seems like the area has improved somewhat still present and I still have redness in my forefoot left and still quite a bit of discomfort and I did have ablation done which did not resolve my A-fib and I did have my ABIs done   ROS      Objective:  Physical Exam  No pulses palpable on the left foot with neurological intact with an area of ulceration of the left first metatarsal measuring about 1.5 x 1.5 cm crusted over currently no active drainage but quite a bit of pain in the joint and first metatarsal head left.  Slight irritation of the fifth metatarsal but not open     Assessment:  Patient has calcification of the arteries has ulceration that is not healing and does have probable osteomyelitic changes     Plan:  H&P reviewed and I am sending for vascular consult to see if there is anything they can do to increase the circulatory status.  I did discuss osteomyelitis and the fact patient will eventually need bone resection and probable MRI but I want to see the results of vascular consult first before making that decision.  I did consult with Dr. Ardelle Anton on this case who agrees with me and I did place back on doxycycline twice daily currently and gave strict instructions if changes were to occur to inform us immediately  X-rays do indicate continued progression of lucency of the first metatarsal head left consistent with osteomyelitis local with calcification of the dorsalis pedis artery noted

## 2023-09-14 ENCOUNTER — Telehealth: Payer: Self-pay | Admitting: Podiatry

## 2023-09-14 NOTE — Telephone Encounter (Signed)
 Patient called following up on his referral from Dr Charlsie Merles. Pt states he has not heard from them yet.

## 2023-09-19 ENCOUNTER — Ambulatory Visit (HOSPITAL_COMMUNITY)
Admit: 2023-09-19 | Discharge: 2023-09-19 | Disposition: A | Discharge status: Home / Self Care | Payer: Medicare Other | Attending: Internal Medicine | Admitting: Internal Medicine

## 2023-09-19 VITALS — BP 120/60 | HR 108 | Ht 69.0 in | Wt 159.4 lb

## 2023-09-19 DIAGNOSIS — I4819 Other persistent atrial fibrillation: Secondary | ICD-10-CM | POA: Diagnosis not present

## 2023-09-19 DIAGNOSIS — Z5181 Encounter for therapeutic drug level monitoring: Secondary | ICD-10-CM | POA: Insufficient documentation

## 2023-09-19 DIAGNOSIS — I130 Hypertensive heart and chronic kidney disease with heart failure and stage 1 through stage 4 chronic kidney disease, or unspecified chronic kidney disease: Secondary | ICD-10-CM | POA: Insufficient documentation

## 2023-09-19 DIAGNOSIS — Z79890 Hormone replacement therapy (postmenopausal): Secondary | ICD-10-CM | POA: Insufficient documentation

## 2023-09-19 DIAGNOSIS — N183 Chronic kidney disease, stage 3 unspecified: Secondary | ICD-10-CM | POA: Insufficient documentation

## 2023-09-19 DIAGNOSIS — I4891 Unspecified atrial fibrillation: Secondary | ICD-10-CM | POA: Diagnosis not present

## 2023-09-19 DIAGNOSIS — D6869 Other thrombophilia: Secondary | ICD-10-CM | POA: Insufficient documentation

## 2023-09-19 DIAGNOSIS — G4733 Obstructive sleep apnea (adult) (pediatric): Secondary | ICD-10-CM | POA: Diagnosis not present

## 2023-09-19 DIAGNOSIS — Z7901 Long term (current) use of anticoagulants: Secondary | ICD-10-CM | POA: Diagnosis not present

## 2023-09-19 DIAGNOSIS — E039 Hypothyroidism, unspecified: Secondary | ICD-10-CM | POA: Insufficient documentation

## 2023-09-19 DIAGNOSIS — Z79899 Other long term (current) drug therapy: Secondary | ICD-10-CM | POA: Diagnosis not present

## 2023-09-19 MED ORDER — AMIODARONE HCL 200 MG PO TABS: ORAL_TABLET | ORAL | 3 refills | Status: DC

## 2023-09-19 NOTE — Progress Notes (Signed)
 Primary Care Physician: Jackelyn Poling, DO Primary Cardiologist: Olga Millers, MD Electrophysiologist: Maurice Small, MD  Referring Physician: Dr Nelly Laurence    Charles [name] is a 83 y.o. male with a history of tachycardia mediated CM, mitral valve regurgitation, CKD, HTN, hypothyroidism, OSA, atrial fibrillation who presents for follow up in the [facility]. He underwent atrial fibrillation ablation in June 2014 by Dr. [name]. He has been maintained on amiodarone. Patient is on edoxaban for a CHADS2VASC score of 3.  On follow up 09/19/23, patient is currently in Afib. S/p Afib ablation on 08/22/23 by Dr. Nelly Laurence. He notes that he was in NSR following the ablation for about 4 days before going back into Afib. He notes to not have cardiac awareness but a BP device at home is reliable in noting when he is out of rhythm. He is taking amiodarone 200 mg daily. No chest pain or SOB. Leg sites healed without issue. No missed doses of [name] 30 mg daily.  Today, he denies symptoms of orthopnea, PND, lower extremity edema, dizziness, presyncope, syncope, snoring, daytime somnolence, bleeding, or neurologic sequela. The patient is tolerating medications without difficulties and is otherwise without complaint today.     Atrial Fibrillation Risk Factors:  he does have symptoms or diagnosis of sleep apnea. he is compliant with CPAP therapy. he does not have a history of rheumatic fever.   Atrial Fibrillation Management history:  Previous antiarrhythmic drugs: amiodarone  Previous cardioversions: several, most recently 10/23/22, 04/03/23 Previous ablations: 2014, 08/22/23 Anticoagulation history: edoxaban   ROS- All systems are reviewed and negative except as per the HPI above.  Past Medical History:  Diagnosis Date   Arthritis    Atrial dilatation    mild biatrial dilitation   Bruises easily    CHF (congestive heart failure) (HCC)    CKD (chronic kidney  disease), stage III (HCC)    Coronary artery disease    Dysrhythmia    atrial fibrillation   GERD (gastroesophageal reflux disease)    H/O hiatal hernia    Hypercholesterolemia    Hyperglycemia    Hypertension    Hypothyroidism    Mitral regurgitation    a. Mild by echo in 01/2014.   Nonischemic cardiomyopathy (HCC)    a. presumed to be tachycardia mediated. Varying EFs over the years from 35-40% to normal and back.   Paroxysmal atrial fibrillation (HCC)    a. H/o difficult to control. b. s/p PVI ablation 12-03-2012 by [name] with recurrence afterwards, requiring period of amiodarone.   Peripheral edema    Sleep apnea    USES C PAP   Tear of medial meniscus of left knee    Tremor     Current Outpatient Medications  Medication Sig Dispense Refill   acetaminophen (TYLENOL) 650 MG CR tablet Take 650 mg by mouth at bedtime.     amLODipine (NORVASC) 10 MG tablet Take 0.5 tablets (5 mg total) by mouth daily.     Ascorbic Acid (VITAMIN C PO) Take 1,000 tablets by mouth 2 (two) times daily. 500 mg     Cholecalciferol (VITAMIN D) 125 MCG (5000 UT) CAPS Take 5,000 Units by mouth in the morning.     Coenzyme Q10 (COQ10) 100 MG CAPS Take 100 mg by mouth every morning.     collagenase (SANTYL) 250 UNIT/GM ointment Apply 1 Application topically daily. Measures 2cm by 2cm (Patient taking differently: Apply 1 Application topically as needed. Measures 2cm by 2cm) 15 g  0   doxycycline (VIBRA-TABS) 100 MG tablet Take 1 tablet (100 mg total) by mouth 2 (two) times daily. 40 tablet 1   edoxaban ([name]) 60 MG TABS tablet Take 30 mg by mouth daily. (Patient taking differently: Take 30 mg by mouth every evening.) 90 tablet 3   enalapril (VASOTEC) 20 MG tablet TAKE 1/2 TABLET BY MOUTH EVERY NIGHT AT BEDTIME 45 tablet 6   EQ MAGNESIUM CITRATE PO Take 420 mg by mouth daily at 12 noon.     IRON PO Take 65 mg by mouth daily at 12 noon.     levothyroxine (SYNTHROID) 125 MCG tablet TAKE 1 TABLET BY MOUTH  DAILY BEFORE BREAKFAST 90 tablet 1   Misc Natural Products (OSTEO BI-FLEX JOINT SHIELD PO) Take 2 tablets by mouth every morning.     Multiple Vitamin (MULTIVITAMIN) tablet Take 1 tablet by mouth daily at 12 noon.     Omega-3 Fatty Acids (FISH OIL) 1200 MG CAPS Take 1,200 mg by mouth 2 (two) times daily.     torsemide (DEMADEX) 20 MG tablet Take 20 mg by mouth in the morning.     zinc gluconate 50 MG tablet Take 50 mg by mouth daily at 12 noon.     amiodarone (PACERONE) 200 MG tablet Take 1 tablet (200 mg total) by mouth 2 (two) times daily for 30 days, THEN 1 tablet (200 mg total) daily. 90 tablet 3   No current facility-administered medications for this encounter.    Physical Exam: BP 120/60   Pulse (!) 108   Ht 5\' 9"  (1.753 m)   Wt 72.3 kg   BMI 23.54 kg/m   GEN- The patient is well appearing, alert and oriented x 3 today.   Neck - no JVD or carotid bruit noted Lungs- Clear to ausculation bilaterally, normal work of breathing Heart- Irregular rate and rhythm, no murmurs, rubs or gallops, PMI not laterally displaced Extremities- no clubbing, cyanosis, or edema Skin - no rash or ecchymosis noted   Wt Readings from Last 3 Encounters:  09/19/23 72.3 kg  08/22/23 79.4 kg  07/26/23 74.3 kg     EKG today demonstrates  Vent. rate 108 BPM PR interval * ms QRS duration 96 ms QT/QTcB 334/447 ms P-R-T axes * 60 263 Atrial fibrillation with rapid ventricular response with premature ventricular or aberrantly conducted complexes ST & T wave abnormality, consider inferolateral ischemia Abnormal ECG When compared with ECG of 22-Aug-2023 12:06, PREVIOUS ECG IS PRESENT  Echo 11/30/22 demonstrated   1. Left ventricular ejection fraction, by estimation, is 55 to 60%. Left  ventricular ejection fraction by 3D volume is 59 %. The left ventricle has  normal function. The left ventricle has no regional wall motion  abnormalities. Left ventricular diastolic parameters were normal.   2. Right  ventricular systolic function is normal. The right ventricular  size is normal. There is moderately elevated pulmonary artery systolic  pressure. The estimated right ventricular systolic pressure is 45.5 mmHg.   3. Left atrial size was severely dilated.   4. Right atrial size was mildly dilated.   5. The mitral valve is degenerative. Mild to moderate mitral valve  regurgitation.   6. The tricuspid valve is degenerative. Tricuspid valve regurgitation is  moderate to severe.   7. The aortic valve is tricuspid. Aortic valve regurgitation is trivial.  Aortic valve sclerosis/calcification is present, without any evidence of  aortic stenosis.   8. The inferior vena cava is normal in size with greater than 50%  respiratory variability, suggesting right atrial pressure of 3 mmHg.   Comparison(s): Changes from prior study are noted. 03/16/2022: LVEF 55-60%, RVSP 69.8 mmHg, severe LAE, mild RAE, moderate MR, moderate TR.    CHA2DS2-VASc Score = 3  The patient's score is based upon: CHF History: 0 (EF normalized with SR) HTN History: 1 Diabetes History: 0 Stroke History: 0 Vascular Disease History: 0 Age Score: 2 Gender Score: 0       ASSESSMENT AND PLAN: Persistent Atrial Fibrillation (ICD10:  I48.19) The patient's CHA2DS2-VASc score is 3, indicating a 3.2% annual risk of stroke.   S/p Afib ablation 2014 S/p DCCV on 04/03/23 with quick return of afib. S/p Afib ablation on 08/22/23 by Dr. Nelly Laurence.  He is currently in Afib. We discussed during blanking period that Afib may occur. Review of Afib ablation does show he required DC cardioversion to sinus rhythm after procedure. After discussion with patient, will reload amiodarone 200 mg BID x 4 weeks with plan to follow up in 3 weeks. I will assess at that time whether he needs another cardioversion. Patient will call prior to appointment if he goes back into normal rhythm.   High risk medication monitoring (ICD10: R7229428) Patient requires  ongoing monitoring for anti-arrhythmic medication which has the potential to cause life threatening arrhythmias or AV block. Reload amiodarone 200 mg BID x 4 weeks.    Secondary Hypercoagulable State (ICD10:  D68.69) The patient is at significant risk for stroke/thromboembolism based upon his CHA2DS2-VASc Score of 3.  Continue Edoxaban ([name]).  Continue [name] 30 mg daily without interruption. No missed doses.   HTN Stable today.    Follow up 3 weeks for reassessment.       Justin Mend, PA-C Afib Clinic [facility] [address] [place], [place] [address] [phone]

## 2023-09-19 NOTE — Patient Instructions (Signed)
 Increase amiodarone 200mg  twice a day for 30 days then reduce to once a day

## 2023-10-02 DIAGNOSIS — M7062 Trochanteric bursitis, left hip: Secondary | ICD-10-CM | POA: Diagnosis not present

## 2023-10-04 ENCOUNTER — Ambulatory Visit (HOSPITAL_COMMUNITY)
Admission: RE | Admit: 2023-10-04 | Discharge: 2023-10-04 | Disposition: A | Discharge status: Home / Self Care | Source: Ambulatory Visit | Attending: Internal Medicine | Admitting: Internal Medicine

## 2023-10-04 VITALS — BP 116/54 | HR 106 | Ht 69.0 in | Wt 158.2 lb

## 2023-10-04 DIAGNOSIS — Z79899 Other long term (current) drug therapy: Secondary | ICD-10-CM

## 2023-10-04 DIAGNOSIS — Z5181 Encounter for therapeutic drug level monitoring: Secondary | ICD-10-CM

## 2023-10-04 DIAGNOSIS — D6869 Other thrombophilia: Secondary | ICD-10-CM | POA: Diagnosis not present

## 2023-10-04 DIAGNOSIS — Z79890 Hormone replacement therapy (postmenopausal): Secondary | ICD-10-CM | POA: Diagnosis not present

## 2023-10-04 DIAGNOSIS — I4891 Unspecified atrial fibrillation: Secondary | ICD-10-CM | POA: Diagnosis not present

## 2023-10-04 DIAGNOSIS — N183 Chronic kidney disease, stage 3 unspecified: Secondary | ICD-10-CM | POA: Diagnosis not present

## 2023-10-04 DIAGNOSIS — I4819 Other persistent atrial fibrillation: Secondary | ICD-10-CM

## 2023-10-04 DIAGNOSIS — Z7901 Long term (current) use of anticoagulants: Secondary | ICD-10-CM | POA: Diagnosis not present

## 2023-10-04 DIAGNOSIS — I081 Rheumatic disorders of both mitral and tricuspid valves: Secondary | ICD-10-CM | POA: Diagnosis not present

## 2023-10-04 DIAGNOSIS — I130 Hypertensive heart and chronic kidney disease with heart failure and stage 1 through stage 4 chronic kidney disease, or unspecified chronic kidney disease: Secondary | ICD-10-CM | POA: Diagnosis not present

## 2023-10-04 DIAGNOSIS — G4733 Obstructive sleep apnea (adult) (pediatric): Secondary | ICD-10-CM | POA: Insufficient documentation

## 2023-10-04 LAB — BASIC METABOLIC PANEL WITH GFR
Anion gap: 16 — ABNORMAL HIGH (ref 5–15)
BUN: 67 mg/dL — ABNORMAL HIGH (ref 8–23)
CO2: 20 mmol/L — ABNORMAL LOW (ref 22–32)
Calcium: 9.8 mg/dL (ref 8.9–10.3)
Chloride: 98 mmol/L (ref 98–111)
Creatinine, Ser: 3.05 mg/dL — ABNORMAL HIGH (ref 0.61–1.24)
GFR, Estimated: 19 mL/min — ABNORMAL LOW (ref >60)
Glucose, Bld: 286 mg/dL — ABNORMAL HIGH (ref 70–99)
Potassium: 4.6 mmol/L (ref 3.5–5.1)
Sodium: 134 mmol/L — ABNORMAL LOW (ref 135–145)

## 2023-10-04 LAB — CBC
HCT: 23.5 % — ABNORMAL LOW (ref 39.0–52.0)
Hemoglobin: 7.3 g/dL — ABNORMAL LOW (ref 13.0–17.0)
MCH: 30.7 pg (ref 26.0–34.0)
MCHC: 31.1 g/dL (ref 30.0–36.0)
MCV: 98.7 fL (ref 80.0–100.0)
Platelets: 291 10*3/uL (ref 150–400)
RBC: 2.38 MIL/uL — ABNORMAL LOW (ref 4.22–5.81)
RDW: 15 % (ref 11.5–15.5)
WBC: 10.6 10*3/uL — ABNORMAL HIGH (ref 4.0–10.5)
nRBC: 0 % (ref 0.0–0.2)

## 2023-10-04 NOTE — Progress Notes (Signed)
 Primary Care Physician: Jackelyn Poling, DO Primary Cardiologist: Olga Millers, MD Electrophysiologist: Maurice Small, MD  Referring Physician: Dr Nelly Laurence    Charles [name] is a 83 y.o. male with a history of tachycardia mediated CM, mitral valve regurgitation, CKD, HTN, hypothyroidism, OSA, atrial fibrillation who presents for follow up in the [facility]. He underwent atrial fibrillation ablation in June 2014 by Dr. [name]. He has been maintained on amiodarone. Patient is on edoxaban for a CHADS2VASC score of 3.  On follow up 09/19/23, patient is currently in Afib. S/p Afib ablation on 08/22/23 by Dr. Nelly Laurence. He notes that he was in NSR following the ablation for about 4 days before going back into Afib. He notes to not have cardiac awareness but a BP device at home is reliable in noting when he is out of rhythm. He is taking amiodarone 200 mg daily. No chest pain or SOB. Leg sites healed without issue. No missed doses of [name] 30 mg daily.  On follow up 10/04/23, patient is currently in Afib. He began amiodarone 200 mg BID reload at last office visit. He started prednisone taper yesterday for hip inflammation and prescription is for 5 days. No missed doses of [name] 30 mg.   Today, he denies symptoms of orthopnea, PND, lower extremity edema, dizziness, presyncope, syncope, snoring, daytime somnolence, bleeding, or neurologic sequela. The patient is tolerating medications without difficulties and is otherwise without complaint today.    Atrial Fibrillation Risk Factors:  he does have symptoms or diagnosis of sleep apnea. he is compliant with CPAP therapy. he does not have a history of rheumatic fever.   Atrial Fibrillation Management history:  Previous antiarrhythmic drugs: amiodarone  Previous cardioversions: several, most recently 10/23/22, 04/03/23 Previous ablations: 2014, 08/22/23 Anticoagulation history: edoxaban   ROS- All systems are reviewed  and negative except as per the HPI above.  Past Medical History:  Diagnosis Date   Arthritis    Atrial dilatation    mild biatrial dilitation   Bruises easily    CHF (congestive heart failure) (HCC)    CKD (chronic kidney disease), stage III (HCC)    Coronary artery disease    Dysrhythmia    atrial fibrillation   GERD (gastroesophageal reflux disease)    H/O hiatal hernia    Hypercholesterolemia    Hyperglycemia    Hypertension    Hypothyroidism    Mitral regurgitation    a. Mild by echo in 01/2014.   Nonischemic cardiomyopathy (HCC)    a. presumed to be tachycardia mediated. Varying EFs over the years from 35-40% to normal and back.   Paroxysmal atrial fibrillation (HCC)    a. H/o difficult to control. b. s/p PVI ablation 12-03-2012 by [name] with recurrence afterwards, requiring period of amiodarone.   Peripheral edema    Sleep apnea    USES C PAP   Tear of medial meniscus of left knee    Tremor     Current Outpatient Medications  Medication Sig Dispense Refill   acetaminophen (TYLENOL) 650 MG CR tablet Take 650 mg by mouth as needed.     amiodarone (PACERONE) 200 MG tablet Take 1 tablet (200 mg total) by mouth 2 (two) times daily for 30 days, THEN 1 tablet (200 mg total) daily. 90 tablet 3   amLODipine (NORVASC) 10 MG tablet Take 0.5 tablets (5 mg total) by mouth daily.     Ascorbic Acid (VITAMIN C PO) Take 1,000 tablets by mouth 2 (two)  times daily. 500 mg     Cholecalciferol (VITAMIN D) 125 MCG (5000 UT) CAPS Take 5,000 Units by mouth in the morning.     Coenzyme Q10 (COQ10) 100 MG CAPS Take 100 mg by mouth every morning.     collagenase (SANTYL) 250 UNIT/GM ointment Apply 1 Application topically daily. Measures 2cm by 2cm (Patient taking differently: Apply 1 Application topically as needed. Measures 2cm by 2cm) 15 g 0   edoxaban ([name]) 60 MG TABS tablet Take 30 mg by mouth daily. (Patient taking differently: Take 30 mg by mouth every evening.) 90 tablet 3    enalapril (VASOTEC) 20 MG tablet TAKE 1/2 TABLET BY MOUTH EVERY NIGHT AT BEDTIME 45 tablet 6   EQ MAGNESIUM CITRATE PO Take 420 mg by mouth daily at 12 noon.     IRON PO Take 65 mg by mouth daily at 12 noon.     levothyroxine (SYNTHROID) 125 MCG tablet TAKE 1 TABLET BY MOUTH DAILY BEFORE BREAKFAST 90 tablet 1   Misc Natural Products (OSTEO BI-FLEX JOINT SHIELD PO) Take 2 tablets by mouth every morning.     Multiple Vitamin (MULTIVITAMIN) tablet Take 1 tablet by mouth daily at 12 noon.     Omega-3 Fatty Acids (FISH OIL) 1200 MG CAPS Take 1,200 mg by mouth 2 (two) times daily.     PREDNISONE PO Take by mouth. Currently taking a taper - He is not sure the dose he is on     torsemide (DEMADEX) 20 MG tablet Take 20 mg by mouth in the morning.     zinc gluconate 50 MG tablet Take 50 mg by mouth daily at 12 noon.     doxycycline (VIBRA-TABS) 100 MG tablet Take 1 tablet (100 mg total) by mouth 2 (two) times daily. (Patient not taking: Reported on 10/04/2023) 40 tablet 1   No current facility-administered medications for this encounter.    Physical Exam: BP (!) 116/54   Pulse (!) 106   Ht 5\' 9"  (1.753 m)   Wt 71.8 kg   BMI 23.36 kg/m   GEN- The patient is well appearing, alert and oriented x 3 today.   Neck - no JVD or carotid bruit noted Lungs- Clear to ausculation bilaterally, normal work of breathing Heart- Irregular rate and rhythm, no murmurs, rubs or gallops, PMI not laterally displaced Extremities- no clubbing, cyanosis, or edema Skin - no rash or ecchymosis noted   Wt Readings from Last 3 Encounters:  10/04/23 71.8 kg  09/19/23 72.3 kg  08/22/23 79.4 kg     EKG today demonstrates  Vent. rate 106 BPM PR interval * ms QRS duration 100 ms QT/QTcB 340/451 ms P-R-T axes * 68 258 Atrial fibrillation with rapid ventricular response with premature ventricular or aberrantly conducted complexes ST & T wave abnormality, consider lateral ischemia Abnormal ECG When compared with ECG of  19-Sep-2023 09:33, PREVIOUS ECG IS PRESENT  Echo 11/30/22 demonstrated   1. Left ventricular ejection fraction, by estimation, is 55 to 60%. Left  ventricular ejection fraction by 3D volume is 59 %. The left ventricle has  normal function. The left ventricle has no regional wall motion  abnormalities. Left ventricular diastolic parameters were normal.   2. Right ventricular systolic function is normal. The right ventricular  size is normal. There is moderately elevated pulmonary artery systolic  pressure. The estimated right ventricular systolic pressure is 45.5 mmHg.   3. Left atrial size was severely dilated.   4. Right atrial size was mildly dilated.  5. The mitral valve is degenerative. Mild to moderate mitral valve  regurgitation.   6. The tricuspid valve is degenerative. Tricuspid valve regurgitation is  moderate to severe.   7. The aortic valve is tricuspid. Aortic valve regurgitation is trivial.  Aortic valve sclerosis/calcification is present, without any evidence of  aortic stenosis.   8. The inferior vena cava is normal in size with greater than 50%  respiratory variability, suggesting right atrial pressure of 3 mmHg.   Comparison(s): Changes from prior study are noted. 03/16/2022: LVEF 55-60%, RVSP 69.8 mmHg, severe LAE, mild RAE, moderate MR, moderate TR.    CHA2DS2-VASc Score = 3  The patient's score is based upon: CHF History: 0 (EF normalized with SR) HTN History: 1 Diabetes History: 0 Stroke History: 0 Vascular Disease History: 0 Age Score: 2 Gender Score: 0       ASSESSMENT AND PLAN: Persistent Atrial Fibrillation (ICD10:  I48.19) The patient's CHA2DS2-VASc score is 3, indicating a 3.2% annual risk of stroke.   S/p Afib ablation 2014 S/p DCCV on 04/03/23 with quick return of afib. S/p Afib ablation on 08/22/23 by Dr. Nelly Laurence.  He is currently in Afib. We discussed original plan with Dr. Nelly Laurence as to AV nodal ablation vs PFA and that after this cardioversion  if he were to have ERAF then likely the former would be the next treatment plan. Patient is in agreement with attempting this cardioversion as the last one; I am in agreement with this as he is still in the blanking period. Will schedule cardioversion after he completes amiodarone reload. Labs drawn today. We discussed the procedure cardioversion to try to convert to NSR. We discussed the risks vs benefits of this procedure. After discussion, the patient wishes to proceed with cardioversion.   Informed Consent   Shared Decision Making/Informed Consent The risks (stroke, cardiac arrhythmias rarely resulting in the need for a temporary or permanent pacemaker, skin irritation or burns and complications associated with conscious sedation including aspiration, arrhythmia, respiratory failure and death), benefits (restoration of normal sinus rhythm) and alternatives of a direct current cardioversion were explained in detail to Mr. Bisping and he agrees to proceed.       High risk medication monitoring (ICD10: R7229428) Patient requires ongoing monitoring for anti-arrhythmic medication which has the potential to cause life threatening arrhythmias or AV block. Reload amiodarone 200 mg BID x 4 weeks. Transition to amiodarone 200 mg daily on 4/25.   Secondary Hypercoagulable State (ICD10:  D68.69) The patient is at significant risk for stroke/thromboembolism based upon his CHA2DS2-VASc Score of 3.  Continue Edoxaban ([name]).  Continue [name] 30 mg daily without interruption. No missed doses.   HTN Stable today.    Follow up with EP as scheduled after DCCV.      Justin Mend, PA-C Afib Clinic [facility] [address] [place], [place] [address] [phone]

## 2023-10-04 NOTE — Patient Instructions (Addendum)
 Amiodarone to once a day after 4/25  Cardioversion scheduled for: Monday, April 28th   - Arrive at the [facility] and go to admitting at 7:00AM   - Do not eat or drink anything after midnight the night prior to your procedure.   - Take all your morning medication (except diabetic medications) with a sip of water prior to arrival.  - You will not be able to drive home after your procedure.    - Do NOT miss any doses of your blood thinner - if you should miss a dose please notify our office immediately.   - If you feel as if you go back into normal rhythm prior to scheduled cardioversion, please notify our office immediately.   If your procedure is canceled in the cardioversion suite you will be charged a cancellation fee.

## 2023-10-05 ENCOUNTER — Ambulatory Visit (INDEPENDENT_AMBULATORY_CARE_PROVIDER_SITE_OTHER)

## 2023-10-05 ENCOUNTER — Encounter: Payer: Self-pay | Admitting: Podiatry

## 2023-10-05 ENCOUNTER — Ambulatory Visit: Admitting: Podiatry

## 2023-10-05 DIAGNOSIS — L97522 Non-pressure chronic ulcer of other part of left foot with fat layer exposed: Secondary | ICD-10-CM | POA: Diagnosis not present

## 2023-10-05 MED ORDER — SILVER SULFADIAZINE 1 % EX CREA: 1.0000 | TOPICAL_CREAM | Freq: Every day | CUTANEOUS | 0 refills | Status: DC

## 2023-10-08 ENCOUNTER — Other Ambulatory Visit (HOSPITAL_COMMUNITY): Payer: Self-pay | Admitting: Family Medicine

## 2023-10-08 DIAGNOSIS — D649 Anemia, unspecified: Secondary | ICD-10-CM

## 2023-10-08 DIAGNOSIS — S8012XA Contusion of left lower leg, initial encounter: Secondary | ICD-10-CM | POA: Diagnosis not present

## 2023-10-08 NOTE — Progress Notes (Signed)
 Subjective:   Patient ID: Charles [name], male   DOB: 83 y.o.   MRN: [id number]   HPI Patient presents with caregiver stating it seems to be a lot better but he did not have his vascular checked but this is the best that this has been in a long time   ROS      Objective:  Physical Exam  Neurovascular status found to be unchanged with area around the first metatarsal head left where he had ulceration which has much less drainage at the current time and no erythema around the joint surface.  Range of motion was adequate no odor was noted and it measures about 8 x 8 mm no subcutaneous exposure     Assessment:  Superficial ulceration of the left first metatarsal with the probability for osteomyelitis but clinically doing much better at this time     Plan:  H&P reviewed with him and caregiver and at this point we are going to continue low usage of Silvadene with sterile dressing open toed shoes and will get a do 2 more weeks of doxycycline.  I explained bone infection and that if this were to become red again if it were to swell or if the area were to open back up again this is gena require bone resection and he understands this completely.  At this point I have a reasonable amount of hope that this will heal and that we will not require bone surgery but we will not know this for a period of time  X-rays indicate there is definite resorption of the first metatarsal head left but I am hoping that the bone has died there is no residual bacteria and if this is true and he is not having pain this would be a better outcome than having surgery

## 2023-10-09 DIAGNOSIS — M25552 Pain in left hip: Secondary | ICD-10-CM | POA: Diagnosis not present

## 2023-10-11 ENCOUNTER — Ambulatory Visit (HOSPITAL_COMMUNITY)
Admission: RE | Admit: 2023-10-11 | Discharge: 2023-10-11 | Disposition: A | Discharge status: Home / Self Care | Source: Ambulatory Visit | Attending: Family Medicine | Admitting: Family Medicine

## 2023-10-11 DIAGNOSIS — D649 Anemia, unspecified: Secondary | ICD-10-CM | POA: Diagnosis not present

## 2023-10-11 DIAGNOSIS — S8012XA Contusion of left lower leg, initial encounter: Secondary | ICD-10-CM | POA: Diagnosis not present

## 2023-10-11 DIAGNOSIS — J929 Pleural plaque without asbestos: Secondary | ICD-10-CM | POA: Diagnosis not present

## 2023-10-11 DIAGNOSIS — I700 Atherosclerosis of aorta: Secondary | ICD-10-CM | POA: Insufficient documentation

## 2023-10-11 DIAGNOSIS — X58XXXA Exposure to other specified factors, initial encounter: Secondary | ICD-10-CM | POA: Insufficient documentation

## 2023-10-11 DIAGNOSIS — K449 Diaphragmatic hernia without obstruction or gangrene: Secondary | ICD-10-CM | POA: Insufficient documentation

## 2023-10-11 DIAGNOSIS — N200 Calculus of kidney: Secondary | ICD-10-CM | POA: Diagnosis not present

## 2023-10-11 DIAGNOSIS — K8689 Other specified diseases of pancreas: Secondary | ICD-10-CM | POA: Diagnosis not present

## 2023-10-11 DIAGNOSIS — N281 Cyst of kidney, acquired: Secondary | ICD-10-CM | POA: Diagnosis not present

## 2023-10-12 ENCOUNTER — Other Ambulatory Visit: Payer: Self-pay | Admitting: Cardiology

## 2023-10-12 DIAGNOSIS — I4891 Unspecified atrial fibrillation: Secondary | ICD-10-CM

## 2023-10-15 NOTE — Telephone Encounter (Signed)
 Prescription refill request for [name]  received.  Indication:afib Last office visit:4/25 Weight:71.8  kg Age:83 Scr:3.05  4/25 CrCl:18.31  ml/min  Prescription refilled

## 2023-10-16 DIAGNOSIS — D649 Anemia, unspecified: Secondary | ICD-10-CM | POA: Diagnosis not present

## 2023-10-16 DIAGNOSIS — E039 Hypothyroidism, unspecified: Secondary | ICD-10-CM | POA: Diagnosis not present

## 2023-10-16 DIAGNOSIS — I10 Essential (primary) hypertension: Secondary | ICD-10-CM | POA: Diagnosis not present

## 2023-10-16 DIAGNOSIS — N184 Chronic kidney disease, stage 4 (severe): Secondary | ICD-10-CM | POA: Diagnosis not present

## 2023-10-16 DIAGNOSIS — Z0000 Encounter for general adult medical examination without abnormal findings: Secondary | ICD-10-CM | POA: Diagnosis not present

## 2023-10-16 DIAGNOSIS — I480 Paroxysmal atrial fibrillation: Secondary | ICD-10-CM | POA: Diagnosis not present

## 2023-10-17 ENCOUNTER — Encounter: Payer: Self-pay | Admitting: Cardiology

## 2023-10-17 NOTE — Telephone Encounter (Signed)
 Called pt/wife I have pt assistance paperwork at the front desk for her to come and pick up, se states that she will be here "right away"  Called [name]  pt assistance and was directed to the wrong pt assistance forms, these forms were for some breast cancer medication. Pt notified.  Left detailed message with the new number for seniors for help with their co-pays.[employer] program [phone] EXT [phone]

## 2023-10-22 ENCOUNTER — Encounter (HOSPITAL_COMMUNITY): Payer: Self-pay

## 2023-10-22 ENCOUNTER — Ambulatory Visit (HOSPITAL_COMMUNITY): Admit: 2023-10-22 | Admitting: Cardiovascular Disease

## 2023-10-22 SURGERY — CARDIOVERSION (CATH LAB)
Anesthesia: General

## 2023-10-29 DIAGNOSIS — G4733 Obstructive sleep apnea (adult) (pediatric): Secondary | ICD-10-CM | POA: Diagnosis not present

## 2023-10-29 DIAGNOSIS — I480 Paroxysmal atrial fibrillation: Secondary | ICD-10-CM | POA: Diagnosis not present

## 2023-10-29 DIAGNOSIS — I10 Essential (primary) hypertension: Secondary | ICD-10-CM | POA: Diagnosis not present

## 2023-10-29 DIAGNOSIS — I272 Pulmonary hypertension, unspecified: Secondary | ICD-10-CM | POA: Diagnosis not present

## 2023-11-07 DIAGNOSIS — H35363 Drusen (degenerative) of macula, bilateral: Secondary | ICD-10-CM | POA: Diagnosis not present

## 2023-11-07 DIAGNOSIS — H35033 Hypertensive retinopathy, bilateral: Secondary | ICD-10-CM | POA: Diagnosis not present

## 2023-11-07 DIAGNOSIS — H35371 Puckering of macula, right eye: Secondary | ICD-10-CM | POA: Diagnosis not present

## 2023-11-07 DIAGNOSIS — H40013 Open angle with borderline findings, low risk, bilateral: Secondary | ICD-10-CM | POA: Diagnosis not present

## 2023-11-18 ENCOUNTER — Other Ambulatory Visit: Payer: Self-pay | Admitting: Cardiology

## 2023-11-19 NOTE — Progress Notes (Unsigned)
 Cardiology Office Note:  .   Date:  11/19/2023  ID:  Charles [name], DOB 1940-08-26, MRN [id number] PCP: Mordechai April, DO   [facility] Providers Cardiologist:  Alexandria Angel, MD Electrophysiologist:  Efraim Grange, MD {  History of Present Illness: .   Charles [name] is a 83 y.o. male w/PMHx of  CKD (III), HTN, HLD, hypothyroidism, OSAS (w/CPAP) NICM (tachy-mediated) CKD  (IV) AFib  He saw Dr. Arlester Ladd 07/26/23 Discussed he had not previously been felt a candidate for repeat PVI ablation though with the evolution of PFA reconsidered. ALSO disussed concerns of his amiodarone  and prhaps some degree of p.tox PFTs showed severely reduced DLCO (47%).  I found a prior pulmonary function test from 2019 that showed a DLCO of 50%.  He has been maintained on amiodarone  since then with very minimal change in his DLCO. I had a long discussion with the patient and his wife about the seriousness of pulmonary fibrosis and amiodarone  --that it can be debilitating and life threatening disease. He also feels that his quality of life will deteriorate severely with atrial fibrillation and would prefer to stay on the medication with ongoing monitoring. Given the patient's advanced age, minimal change in DLCO over 6 years, and paucity of symptoms, I think it reasonable to continue amiodarone  for now.   Severe pulmonary hypertension May need to consider as possible contributor to edema. Pt has follow-up in Cardiology clinic   ABLATION 08/22/23   AFib early post ablation, March 2025 amiodarone  pulsed, following with the AFib cliic At his 10/04/23 visit also had been started on a steroid taper for hip pain In d/w Dr. Arlester Ladd: AV nodal ablation vs PFA and that after this cardioversion if he were to have ERAF then likely the former would be the next treatment plan  Planned for a final attempt of DCCV after amio re-load  Scheduled for DCCV 10/22/23 > cancelled   Today's visit is scheduled as  his 90 day post ablation visit ROS:   He comes today accompanied by his wife  He reports he was called to cancel his DCCV because of abnormal labs/low blood level, though does not recall being told to follow up anywhere, do anything  He denies any obvious or overt bleeding Denies any melena, stool changes, belly pain etc. No changes to his exertional capacity  No CP, palpitations or cardiac awareness He is not aware of his AFib uless he checks his BP and the "heart" icon lights up alerting to the abnormal rhythm  He is happy with how he feels/is doing. Not bothered or noted to have any particular symptoms of Afib He would be OK not pursuing rhythm control going forward Reports home HRs generally 100-110's when he checks Some 70's this usual early AM hours Nothing very slow or very fast  No dizzy spells, near syncope or syncope  Arrhythmia/AAD hx AFib ablation June 2014 (Dr. Nunzio Belch) AFib ablation 08/22/23 (Dr. Arlester Ladd)  amiodarone   Studies Reviewed: Aaron Aas    EKG done today and reviewed by myself:  AFib 93bpm   08/22/23: EPS/ablation CONCLUSIONS: 1. Atrial fibrillation upon presentation.   2. Successful ablation of all four pulmonary veins with pulsed field energy. 3. Successful ablation of the posterior wall of the left atrium with pulsed field energy 4. DC cardioversion to sinus rhythm.  5. No inducible arrhythmias following ablation  6. No early apparent complications.   08/22/23 TEE 1. Left ventricular ejection fraction, by estimation, is 60 to 65%. The  left ventricle has normal function.   2. Right ventricular systolic function is normal. The right ventricular  size is normal.   3. Left atrial size was severely dilated. No left atrial/left atrial  appendage thrombus was detected.   4. Right atrial size was moderately dilated.   5. The mitral valve is degenerative. Moderate mitral valve regurgitation.   6. The septal leaflet of the tricuspid valve is calcifired and  appears to  prolapse with moderate eccentic TR. The tricuspid valve is abnormal.  Tricuspid valve regurgitation is moderate.   7. The aortic valve is tricuspid. There is mild calcification of the  aortic valve. Aortic valve regurgitation is not visualized.   8. 3D performed of the tricuspid valve.    Risk Assessment/Calculations:    Physical Exam:   VS:  There were no vitals taken for this visit.   Wt Readings from Last 3 Encounters:  10/04/23 158 lb 3.2 oz (71.8 kg)  09/19/23 159 lb 6.4 oz (72.3 kg)  08/22/23 175 lb (79.4 kg)    GEN: Well nourished, well developed in no acute distress No conjunctival, oral pallor noted NECK: No JVD; No carotid bruits CARDIAC: irreg-irreg, no murmurs, rubs, gallops RESPIRATORY:  CTA b/l without rales, wheezing or rhonchi  ABDOMEN: Soft, non-tender, non-distended EXTREMITIES: No edema; No deformity   ASSESSMENT AND PLAN: .    persistent AFib CHA2DS2Vasc is 4, on [name] , appropriately dosed unclear burden, not overtly symptomatic   [name]  has become very expensive > pending labs and decisions regarding OAC will have him check on Eliquis /Xarelto  as alternative BMET/CBC stat today  No symptoms of anemia No pallor is noted Stable BP, not tachycardic here Denies any signs of or overt bleeding  Suspect we may revert to rate management strategy   HTN Looks good  NICM Described as tachy-mediated Recovered LVEF HR here looks good > he reports 100's-110's when he checks Will need to keep tight management of HR Dr. Arlester Ladd has mentioned poss need for PPM/AVN ablation   Secondary hypercoagulable state 2/2 AFib   Dispo: back in a month, sooner if needed pending his labs   Signed, Debbie Fails, PA-C

## 2023-11-20 ENCOUNTER — Ambulatory Visit: Payer: Self-pay | Admitting: Physician Assistant

## 2023-11-20 ENCOUNTER — Encounter: Payer: Self-pay | Admitting: Physician Assistant

## 2023-11-20 ENCOUNTER — Ambulatory Visit: Payer: Medicare Other | Attending: Physician Assistant | Admitting: Physician Assistant

## 2023-11-20 VITALS — BP 134/60 | HR 93 | Ht 69.0 in | Wt 162.6 lb

## 2023-11-20 DIAGNOSIS — D6869 Other thrombophilia: Secondary | ICD-10-CM

## 2023-11-20 DIAGNOSIS — I4819 Other persistent atrial fibrillation: Secondary | ICD-10-CM

## 2023-11-20 DIAGNOSIS — Z79899 Other long term (current) drug therapy: Secondary | ICD-10-CM

## 2023-11-20 DIAGNOSIS — I10 Essential (primary) hypertension: Secondary | ICD-10-CM

## 2023-11-20 DIAGNOSIS — I4811 Longstanding persistent atrial fibrillation: Secondary | ICD-10-CM | POA: Diagnosis not present

## 2023-11-20 DIAGNOSIS — D508 Other iron deficiency anemias: Secondary | ICD-10-CM

## 2023-11-20 DIAGNOSIS — I428 Other cardiomyopathies: Secondary | ICD-10-CM

## 2023-11-20 LAB — BASIC METABOLIC PANEL WITH GFR
BUN/Creatinine Ratio: 16 (ref 10–24)
BUN: 43 mg/dL — ABNORMAL HIGH (ref 8–27)
CO2: 29 mmol/L (ref 20–29)
Calcium: 9.9 mg/dL (ref 8.6–10.2)
Chloride: 97 mmol/L (ref 96–106)
Creatinine, Ser: 2.63 mg/dL — ABNORMAL HIGH (ref 0.76–1.27)
Glucose: 97 mg/dL (ref 70–99)
Potassium: 5.1 mmol/L (ref 3.5–5.2)
Sodium: 137 mmol/L (ref 134–144)
eGFR: 23 mL/min/{1.73_m2} — ABNORMAL LOW (ref >59)

## 2023-11-20 LAB — CBC
Hematocrit: 33.8 % — ABNORMAL LOW (ref 37.5–51.0)
Hemoglobin: 11 g/dL — ABNORMAL LOW (ref 13.0–17.7)
MCH: 30.9 pg (ref 26.6–33.0)
MCHC: 32.5 g/dL (ref 31.5–35.7)
MCV: 95 fL (ref 79–97)
Platelets: 218 10*3/uL (ref 150–450)
RBC: 3.56 x10E6/uL — ABNORMAL LOW (ref 4.14–5.80)
RDW: 14.8 % (ref 11.6–15.4)
WBC: 11.1 10*3/uL — ABNORMAL HIGH (ref 3.4–10.8)

## 2023-11-20 NOTE — Patient Instructions (Signed)
 Medication Instructions:   Your physician recommends that you continue on your current medications as directed. Please refer to the Current Medication list given to you today.  *If you need a refill on your cardiac medications before your next appointment, please call your pharmacy*   Lab Work:  PLEASE GO DOWN STAIRS  [facility]  FIRST FLOOR   ( GET OFF ELEVATORS WALK TOWARDS WAITING AREA LAB LOCATED BY PHARMACY):  BMET AND CBC TODAY    If you have labs (blood work) drawn today and your tests are completely normal, you will receive your results only by: MyChart Message (if you have MyChart) OR A paper copy in the mail If you have any lab test that is abnormal or we need to change your treatment, we will call you to review the results.  Testing/Procedures: NONE ORDERED  TODAY    Follow-Up: At [facility], you and your health needs are our priority.  As part of our continuing mission to provide you with exceptional heart care, our providers are all part of one team.  This team includes your primary Cardiologist (physician) and Advanced Practice Providers or APPs (Physician Assistants and Nurse Practitioners) who all work together to provide you with the care you need, when you need it.  Your next appointment:    1 month(s) ( CONTACT  CASSIE HALL/ ANGELINE HAMMER FOR EP SCHEDULING ISSUES )   Provider:    Marlane Silver, MD or Mertha Abrahams, PA-C      We recommend signing up for the patient portal called "MyChart".  Sign up information is provided on this After Visit Summary.  MyChart is used to connect with patients for Virtual Visits (Telemedicine).  Patients are able to view lab/test results, encounter notes, upcoming appointments, etc.  Non-urgent messages can be sent to your provider as well.   To learn more about what you can do with MyChart, go to [email or web address].   Other Instructions

## 2023-11-26 DIAGNOSIS — G4733 Obstructive sleep apnea (adult) (pediatric): Secondary | ICD-10-CM | POA: Diagnosis not present

## 2023-12-18 ENCOUNTER — Encounter: Payer: Self-pay | Admitting: Cardiovascular Disease

## 2023-12-18 ENCOUNTER — Ambulatory Visit: Attending: Cardiovascular Disease | Admitting: Cardiovascular Disease

## 2023-12-18 VITALS — BP 110/52 | HR 93 | Ht 69.0 in | Wt 163.0 lb

## 2023-12-18 DIAGNOSIS — I4819 Other persistent atrial fibrillation: Secondary | ICD-10-CM | POA: Diagnosis not present

## 2023-12-18 MED ORDER — METOPROLOL SUCCINATE ER 25 MG PO TB24: 25.0000 mg | ORAL_TABLET | Freq: Every day | ORAL | 3 refills | Status: DC

## 2023-12-18 NOTE — Progress Notes (Signed)
 Electrophysiology Office Note:    Date:  12/18/2023   ID:  Charles [name], DOB [birth year], MRN [id number]  PCP:  Dayna Motto, DO   [facility] Providers Cardiologist:  Redell Shallow, MD Electrophysiologist:  Eulas FORBES Furbish, MD     Referring MD: Dayna Motto, DO   History of Present Illness:    Charles [name] is a 83 y.o. male with a hx listed below, significant for paroxysmal atrial fibrillation and tachycardia mediated cardiomyopathy, mitral valve regurgitation, CKD stage III, hypothyroidism, hypertension, sleep apnea referred for arrhythmia management.  He underwent atrial fibrillation ablation in June 2014 by Dr. Kelsie. He has had about 9 cardioversions over the years. The prior cardioversion was in January, 2023.  He has been maintained on amiodarone  but return the office in April 2024 with recurrence of atrial fibrillation, rate controlled he underwent DC cardioversion on April 29. It took him weeks to recover from this episode of AF and cardioversion. He had fluid retention, increased weakness.  He underwent routine screening studies for amiodarone  management and was found to have severely reduced DLCO (47%) on pulmonary function tests.  A-fib recurred in August 2024.  He is extremely symptomatic and unable to function normally.  He has fatigue and pretty much just sits in the chair and sleeps.  He underwent attempted DC cardioversion in October 2024 but had recurrence of atrial fibrillation.  He underwent ablation of atrial fibrillation with ablation of the posterior wall and pulmonary veins in February 2025.  Unfortunately, he had recurrence of atrial fibrillation by follow-up in March.  Amiodarone  was started.  He has continued to remain in atrial fibrillation.  Rates are controlled on home pulse checks.    EKGs/Labs/Other Studies Reviewed Today:    Echocardiogram:  TTE 03/16/2022 EF 55 to 60%, grade 2 diastolic dysfunction, severely dilated left  atrium  TTE 11/30/2022 EF 55-60%, severely dilated LA   Monitors:   Stress testing:   Advanced imaging:   EKG:  Last EKG results: today - sinus rhythm, nonspecific ST and T wave abnormality   Recent Labs: 07/18/2023: ALT 21 11/20/2023: BUN 43; Creatinine, Ser 2.63; Hemoglobin 11.0; Platelets 218; Potassium 5.1; Sodium 137   PFTs 2018 -- DLCO 50% PFTs 2024 -- DLCO 47%  Physical Exam:    VS:  BP (!) 110/52   Pulse 93   Ht 5' 9 (1.753 m)   Wt 163 lb (73.9 kg)   SpO2 95%   BMI 24.07 kg/m     Wt Readings from Last 3 Encounters:  12/18/23 163 lb (73.9 kg)  11/20/23 162 lb 9.6 oz (73.8 kg)  10/04/23 158 lb 3.2 oz (71.8 kg)     GEN: Well nourished, well developed in no acute distress CARDIAC: RRR, no murmurs, rubs, gallops RESPIRATORY:  Normal work of breathing MUSCULOSKELETAL: no edema    ASSESSMENT & PLAN:    Permanent atrial fibrillation Status post ablation 2014, 2025 multiple cardioversions  Has had recurrence of A-fib on amiodarone   Severely dilated LA in September 2023 (4.6 cm) AF has recurred soon after ablation with PFA I do not think there is a reasonable expectation that he will maintain sinus rhythm, and the risks of amiodarone  outweigh the benefits for him Rates are currently controlled, so I do not see benefit of pacemaker/AVJ Will DC amiodarone  and start metoprolol  XL 25 and titrate for a goal HR of < 110 bpm at rest.   Monitoring of high risk medication Amiodarone  -- now with permanent AF. Will  DC amiodarone .   Severe pulmonary hypertension May need to consider as possible contributor to edema. Pt has follow-up in Cardiology clinic  Mitral valve regurgitation Moderate on most recent echo, 03/15/2022  CKD stage IIIb Most recent Cr 2.99 - limits AAD options  Obstructive sleep apnea Adherent to CPAP          Medication Adjustments/Labs and Tests Ordered: Current medicines are reviewed at length with the patient today.  Concerns  regarding medicines are outlined above.  Orders Placed This Encounter  Procedures   EKG 12-Lead   No orders of the defined types were placed in this encounter.    Signed, Eulas FORBES Furbish, MD  12/18/2023 10:22 AM    [facility]

## 2023-12-18 NOTE — Patient Instructions (Signed)
 Medication Instructions:  STOP Amiodarone   START Metoprolol  XL 25 mg once daily  *If you need a refill on your cardiac medications before your next appointment, please call your pharmacy*   Follow-Up: At [facility], you and your health needs are our priority.  As part of our continuing mission to provide you with exceptional heart care, our providers are all part of one team.  This team includes your primary Cardiologist (physician) and Advanced Practice Providers or APPs (Physician Assistants and Nurse Practitioners) who all work together to provide you with the care you need, when you need it.  Your next appointment:   2 month(s)  Provider:   Eulas Furbish, MD

## 2023-12-21 DIAGNOSIS — E039 Hypothyroidism, unspecified: Secondary | ICD-10-CM | POA: Diagnosis not present

## 2023-12-21 DIAGNOSIS — M7021 Olecranon bursitis, right elbow: Secondary | ICD-10-CM | POA: Diagnosis not present

## 2023-12-21 DIAGNOSIS — R5383 Other fatigue: Secondary | ICD-10-CM | POA: Diagnosis not present

## 2023-12-26 DIAGNOSIS — G4733 Obstructive sleep apnea (adult) (pediatric): Secondary | ICD-10-CM | POA: Diagnosis not present

## 2024-01-04 DIAGNOSIS — N184 Chronic kidney disease, stage 4 (severe): Secondary | ICD-10-CM | POA: Diagnosis not present

## 2024-01-08 DIAGNOSIS — M7062 Trochanteric bursitis, left hip: Secondary | ICD-10-CM | POA: Diagnosis not present

## 2024-01-11 ENCOUNTER — Telehealth (HOSPITAL_COMMUNITY): Payer: Self-pay

## 2024-01-11 DIAGNOSIS — N2581 Secondary hyperparathyroidism of renal origin: Secondary | ICD-10-CM | POA: Diagnosis not present

## 2024-01-11 DIAGNOSIS — D631 Anemia in chronic kidney disease: Secondary | ICD-10-CM | POA: Diagnosis not present

## 2024-01-11 DIAGNOSIS — N184 Chronic kidney disease, stage 4 (severe): Secondary | ICD-10-CM | POA: Diagnosis not present

## 2024-01-11 DIAGNOSIS — R3129 Other microscopic hematuria: Secondary | ICD-10-CM | POA: Diagnosis not present

## 2024-01-11 DIAGNOSIS — I129 Hypertensive chronic kidney disease with stage 1 through stage 4 chronic kidney disease, or unspecified chronic kidney disease: Secondary | ICD-10-CM | POA: Diagnosis not present

## 2024-01-11 NOTE — Telephone Encounter (Signed)
 Auth Submission: NO AUTH NEEDED Site of care: Site of care: [facility] Payer: BC[employer] Medicare Medication & CPT/J Code(s) submitted: Venofer (Iron Sucrose) J1756 Diagnosis Code: D63.1 Route of submission (phone, fax, portal):  Phone # Fax # Auth type: Buy/Bill HB Units/visits requested: 250mg  x 4 doses Reference number:  Approval from: 01/11/24 to 05/13/24

## 2024-01-23 ENCOUNTER — Encounter (HOSPITAL_COMMUNITY)
Admission: RE | Admit: 2024-01-23 | Discharge: 2024-01-23 | Disposition: A | Discharge status: Home / Self Care | Source: Ambulatory Visit | Attending: Nephrology | Admitting: Nephrology

## 2024-01-23 VITALS — BP 137/54 | HR 47 | Temp 98.1°F | Resp 16

## 2024-01-23 DIAGNOSIS — N183 Chronic kidney disease, stage 3 unspecified: Secondary | ICD-10-CM | POA: Insufficient documentation

## 2024-01-23 DIAGNOSIS — D631 Anemia in chronic kidney disease: Secondary | ICD-10-CM | POA: Diagnosis not present

## 2024-01-23 DIAGNOSIS — N184 Chronic kidney disease, stage 4 (severe): Secondary | ICD-10-CM | POA: Diagnosis not present

## 2024-01-23 MED ORDER — SODIUM CHLORIDE 0.9 % IV SOLN
250.0000 mg | INTRAVENOUS | Status: DC
  Administered 2024-01-23: 250 mg via INTRAVENOUS
  Filled 2024-01-23: qty 12.5

## 2024-01-26 DIAGNOSIS — G4733 Obstructive sleep apnea (adult) (pediatric): Secondary | ICD-10-CM | POA: Diagnosis not present

## 2024-01-28 DIAGNOSIS — I5189 Other ill-defined heart diseases: Secondary | ICD-10-CM | POA: Diagnosis not present

## 2024-01-28 DIAGNOSIS — G4733 Obstructive sleep apnea (adult) (pediatric): Secondary | ICD-10-CM | POA: Diagnosis not present

## 2024-01-28 DIAGNOSIS — I272 Pulmonary hypertension, unspecified: Secondary | ICD-10-CM | POA: Diagnosis not present

## 2024-01-28 DIAGNOSIS — I480 Paroxysmal atrial fibrillation: Secondary | ICD-10-CM | POA: Diagnosis not present

## 2024-01-30 ENCOUNTER — Ambulatory Visit (HOSPITAL_COMMUNITY)
Admission: RE | Admit: 2024-01-30 | Discharge: 2024-01-30 | Disposition: A | Discharge status: Home / Self Care | Source: Ambulatory Visit | Attending: Nephrology | Admitting: Nephrology

## 2024-01-30 VITALS — BP 151/56 | HR 60 | Temp 97.8°F | Resp 17

## 2024-01-30 DIAGNOSIS — N183 Chronic kidney disease, stage 3 unspecified: Secondary | ICD-10-CM | POA: Diagnosis not present

## 2024-01-30 DIAGNOSIS — D631 Anemia in chronic kidney disease: Secondary | ICD-10-CM | POA: Insufficient documentation

## 2024-01-30 MED ORDER — SODIUM CHLORIDE 0.9 % IV SOLN
250.0000 mg | INTRAVENOUS | Status: DC
  Administered 2024-01-30: 250 mg via INTRAVENOUS
  Filled 2024-01-30: qty 12.5

## 2024-02-04 ENCOUNTER — Other Ambulatory Visit: Payer: Self-pay | Admitting: *Deleted

## 2024-02-04 MED ORDER — AMLODIPINE BESYLATE 10 MG PO TABS: 5.0000 mg | ORAL_TABLET | Freq: Every day | ORAL | 0 refills | Status: DC

## 2024-02-06 ENCOUNTER — Encounter (HOSPITAL_COMMUNITY)
Admission: RE | Admit: 2024-02-06 | Discharge: 2024-02-06 | Disposition: A | Discharge status: Home / Self Care | Source: Ambulatory Visit | Attending: Nephrology | Admitting: Nephrology

## 2024-02-06 VITALS — BP 144/52 | HR 97 | Temp 98.2°F | Resp 16

## 2024-02-06 DIAGNOSIS — N183 Chronic kidney disease, stage 3 unspecified: Secondary | ICD-10-CM | POA: Insufficient documentation

## 2024-02-06 DIAGNOSIS — D631 Anemia in chronic kidney disease: Secondary | ICD-10-CM | POA: Diagnosis not present

## 2024-02-06 MED ORDER — SODIUM CHLORIDE 0.9 % IV SOLN
250.0000 mg | INTRAVENOUS | Status: DC
  Administered 2024-02-06 (×2): 250 mg via INTRAVENOUS
  Filled 2024-02-06: qty 12.5

## 2024-02-11 ENCOUNTER — Other Ambulatory Visit: Payer: Self-pay | Admitting: Cardiology

## 2024-02-11 DIAGNOSIS — E039 Hypothyroidism, unspecified: Secondary | ICD-10-CM

## 2024-02-13 ENCOUNTER — Ambulatory Visit (HOSPITAL_COMMUNITY)
Admission: RE | Admit: 2024-02-13 | Discharge: 2024-02-13 | Disposition: A | Discharge status: Home / Self Care | Source: Ambulatory Visit | Attending: Nephrology | Admitting: Nephrology

## 2024-02-13 VITALS — BP 137/64 | HR 58 | Temp 97.0°F | Resp 16

## 2024-02-13 DIAGNOSIS — N183 Chronic kidney disease, stage 3 unspecified: Secondary | ICD-10-CM

## 2024-02-13 DIAGNOSIS — D631 Anemia in chronic kidney disease: Secondary | ICD-10-CM | POA: Diagnosis not present

## 2024-02-13 MED ORDER — LEVOTHYROXINE SODIUM 125 MCG PO TABS: 125.0000 ug | ORAL_TABLET | Freq: Every day | ORAL | 1 refills | Status: DC

## 2024-02-13 MED ORDER — SODIUM CHLORIDE 0.9 % IV SOLN
250.0000 mg | INTRAVENOUS | Status: DC
  Administered 2024-02-13: 250 mg via INTRAVENOUS
  Filled 2024-02-13: qty 12.5

## 2024-02-17 NOTE — Progress Notes (Unsigned)
 Cardiology Office Note:  .   Date:  02/20/2024  ID:  Charles [name], DOB Mar 05, 1941, MRN [id number] PCP: Dayna Motto, DO  [facility] Providers Cardiologist:  Redell Shallow, MD Electrophysiologist:  Eulas FORBES Furbish, MD {  History of Present Illness: .   Charles [name] is a 83 y.o. male w/PMHx of  CKD (III), HTN, HLD, hypothyroidism, OSAS (w/CPAP) NICM (tachy-mediated) CKD  (IV) AFib  He saw Dr. Furbish 07/26/23 Discussed he had not previously been felt a candidate for repeat PVI ablation though with the evolution of PFA reconsidered. ALSO disussed concerns of his amiodarone  and prhaps some degree of p.tox PFTs showed severely reduced DLCO (47%).  I found a prior pulmonary function test from 2019 that showed a DLCO of 50%.  He has been maintained on amiodarone  since then with very minimal change in his DLCO. I had a long discussion with the patient and his wife about the seriousness of pulmonary fibrosis and amiodarone  --that it can be debilitating and life threatening disease. He also feels that his quality of life will deteriorate severely with atrial fibrillation and would prefer to stay on the medication with ongoing monitoring. Given the patient's advanced age, minimal change in DLCO over 6 years, and paucity of symptoms, I think it reasonable to continue amiodarone  for now.   Severe pulmonary hypertension May need to consider as possible contributor to edema. Pt has follow-up in Cardiology clinic   ABLATION 08/22/23   AFib early post ablation, March 2025 amiodarone  pulsed, following with the AFib cliic At his 10/04/23 visit also had been started on a steroid taper for hip pain In d/w Dr. Furbish: AV nodal ablation vs PFA and that after this cardioversion if he were to have ERAF then likely the former would be the next treatment plan  Planned for a final attempt of DCCV after amio re-load  Scheduled for DCCV 10/22/23 > cancelled 2/2 abnormal labs  I saw him  11/20/23 He comes today accompanied by his wife He reports he was called to cancel his DCCV because of abnormal labs/low blood level, though does not recall being told to follow up anywhere, do anything He denies any obvious or overt bleeding Denies any melena, stool changes, belly pain etc. No changes to his exertional capacity No CP, palpitations or cardiac awareness He is not aware of his AFib uless he checks his BP and the heart icon lights up alerting to the abnormal rhythm He is happy with how he feels/is doing. Not bothered or noted to have any particular symptoms of Afib He would be OK not pursuing rhythm control going forward Reports home HRs generally 100-110's when he checks Some 70's this usual early AM hours Nothing very slow or very fast No dizzy spells, near syncope or syncope  Planned for stat labs and pending those, 1 mo f/u [name]  was getting too expensive > given Eliquis /xarelto  to look into  H/H was at baseline, other result suspect spurious  He saw Dr. Furbish 12/18/23, rate controlled though symptomatic with AFib, established rate control strategy > amiodarone  stopped and metoprolol  started  Today's visit is scheduled as a 38mo f/u visit ROS:   He is accompanied by his wife. Reports shortly after med change/Dr. Mealor's visit he converted back to normal rhythm and has felt well. No CP, palpitations, cardiac awareness No SOB, DOE Denies any exertional intolerances  [name]  has gotten expensive for him   Arrhythmia/AAD hx AFib ablation June 2014 (Dr. Kelsie) AFib ablation 08/22/23 (  Dr. Nancey)  Amiodarone  stopped after abnormal PFTs (severely reduced DLCO (47%)) Restarted  March 2025 stopped Jue 2025 with failure to maintain SR Established rate control strategy  Studies Reviewed: SABRA    EKG done today and reviewed by myself:  SB/sinus arrhythmia 54bpm   08/22/23: EPS/ablation CONCLUSIONS: 1. Atrial fibrillation upon presentation.   2. Successful  ablation of all four pulmonary veins with pulsed field energy. 3. Successful ablation of the posterior wall of the left atrium with pulsed field energy 4. DC cardioversion to sinus rhythm.  5. No inducible arrhythmias following ablation  6. No early apparent complications.   08/22/23 TEE 1. Left ventricular ejection fraction, by estimation, is 60 to 65%. The  left ventricle has normal function.   2. Right ventricular systolic function is normal. The right ventricular  size is normal.   3. Left atrial size was severely dilated. No left atrial/left atrial  appendage thrombus was detected.   4. Right atrial size was moderately dilated.   5. The mitral valve is degenerative. Moderate mitral valve regurgitation.   6. The septal leaflet of the tricuspid valve is calcifired and appears to  prolapse with moderate eccentic TR. The tricuspid valve is abnormal.  Tricuspid valve regurgitation is moderate.   7. The aortic valve is tricuspid. There is mild calcification of the  aortic valve. Aortic valve regurgitation is not visualized.   8. 3D performed of the tricuspid valve.    Risk Assessment/Calculations:    Physical Exam:   VS:  BP 122/74   Pulse (!) 59   Ht 5' 9 (1.753 m)   Wt 171 lb 3.2 oz (77.7 kg)   SpO2 98%   BMI 25.28 kg/m    Wt Readings from Last 3 Encounters:  02/20/24 171 lb 3.2 oz (77.7 kg)  12/18/23 163 lb (73.9 kg)  11/20/23 162 lb 9.6 oz (73.8 kg)    GEN: Well nourished, well developed in no acute distress No conjunctival, oral pallor noted NECK: No JVD; No carotid bruits CARDIAC: RRR, no murmurs, rubs, gallops RESPIRATORY: CTA b/l without rales, wheezing or rhonchi  ABDOMEN: Soft, non-tender, non-distended EXTREMITIES: No edema; No deformity   ASSESSMENT AND PLAN: .    Longstanding persistent AFib CHA2DS2Vasc is 4, on [name] , appropriately dosed  He reports no drug coverage, perhaps has been getting discount via drug company, he is unsure. Advised that he  ask his pharmacy why the cost changed so dramatrically 1st, he may need to fill out paperwork again. Also advised to check cost of Xarelto , Eliquis  Warfarin would be an option though he has been on that before and would like to avoid that  Briefly discussed going back to amiodarone , though they would like to avoid it No symptoms of bradycardia   HTN Looks good  NICM Described as tachy-mediated Recovered LVEF Has an overdue recall in for Dr. Harley    Secondary hypercoagulable state 2/2 AFib   Dispo: back with EP again in 23mo,  sooner if needed pending his labs   Signed, Charlies Macario Arthur, PA-C

## 2024-02-18 ENCOUNTER — Ambulatory Visit: Admitting: Pulmonary Disease

## 2024-02-20 ENCOUNTER — Ambulatory Visit: Attending: Cardiovascular Disease | Admitting: Physician Assistant

## 2024-02-20 ENCOUNTER — Encounter: Payer: Self-pay | Admitting: Physician Assistant

## 2024-02-20 VITALS — BP 122/74 | HR 59 | Ht 69.0 in | Wt 171.2 lb

## 2024-02-20 DIAGNOSIS — D6869 Other thrombophilia: Secondary | ICD-10-CM | POA: Diagnosis not present

## 2024-02-20 DIAGNOSIS — I4811 Longstanding persistent atrial fibrillation: Secondary | ICD-10-CM

## 2024-02-20 DIAGNOSIS — I428 Other cardiomyopathies: Secondary | ICD-10-CM | POA: Diagnosis not present

## 2024-02-20 DIAGNOSIS — I10 Essential (primary) hypertension: Secondary | ICD-10-CM | POA: Diagnosis not present

## 2024-02-20 MED ORDER — EDOXABAN TOSYLATE 60 MG PO TABS
30.0000 mg | ORAL_TABLET | Freq: Every day | ORAL | 2 refills | Status: AC
Start: 2024-02-20 — End: ?

## 2024-02-20 NOTE — Patient Instructions (Addendum)
 Medication Instructions:   Your physician recommends that you continue on your current medications as directed. Please refer to the Current Medication list given to you today.   *If you need a refill on your cardiac medications before your next appointment, please call your pharmacy*   Lab Work: NONE ORDERED  TODAY     If you have labs (blood work) drawn today and your tests are completely normal, you will receive your results only by: MyChart Message (if you have MyChart) OR A paper copy in the mail If you have any lab test that is abnormal or we need to change your treatment, we will call you to review the results.    Testing/Procedures: NONE ORDERED  TODAY     Follow-Up: At [facility], you and your health needs are our priority.  As part of our continuing mission to provide you with exceptional heart care, our providers are all part of one team.  This team includes your primary Cardiologist (physician) and Advanced Practice Providers or APPs (Physician Assistants and Nurse Practitioners) who all work together to provide you with the care you need, when you need it.    Your next appointment:  OVER DUE TO SEE  CRENSHAW / APP  NEXT AVAILABLE                                         AND   6 month(s)  Provider:  You may see Eulas FORBES Furbish, MD or Charlies Arthur, PA-C     We recommend signing up for the patient portal called MyChart.  Sign up information is provided on this After Visit Summary.  MyChart is used to connect with patients for Virtual Visits (Telemedicine).  Patients are able to view lab/test results, encounter notes, upcoming appointments, etc.  Non-urgent messages can be sent to your provider as well.   To learn more about what you can do with MyChart, go to [email or web address].    Other Instructions

## 2024-02-26 DIAGNOSIS — G4733 Obstructive sleep apnea (adult) (pediatric): Secondary | ICD-10-CM | POA: Diagnosis not present

## 2024-03-03 DIAGNOSIS — N184 Chronic kidney disease, stage 4 (severe): Secondary | ICD-10-CM | POA: Diagnosis not present

## 2024-03-10 DIAGNOSIS — N184 Chronic kidney disease, stage 4 (severe): Secondary | ICD-10-CM | POA: Diagnosis not present

## 2024-03-10 DIAGNOSIS — N2581 Secondary hyperparathyroidism of renal origin: Secondary | ICD-10-CM | POA: Diagnosis not present

## 2024-03-10 DIAGNOSIS — I129 Hypertensive chronic kidney disease with stage 1 through stage 4 chronic kidney disease, or unspecified chronic kidney disease: Secondary | ICD-10-CM | POA: Diagnosis not present

## 2024-03-10 DIAGNOSIS — D631 Anemia in chronic kidney disease: Secondary | ICD-10-CM | POA: Diagnosis not present

## 2024-03-20 DIAGNOSIS — G4733 Obstructive sleep apnea (adult) (pediatric): Secondary | ICD-10-CM | POA: Diagnosis not present

## 2024-04-22 DIAGNOSIS — I480 Paroxysmal atrial fibrillation: Secondary | ICD-10-CM | POA: Diagnosis not present

## 2024-04-22 DIAGNOSIS — E039 Hypothyroidism, unspecified: Secondary | ICD-10-CM | POA: Diagnosis not present

## 2024-04-22 DIAGNOSIS — N184 Chronic kidney disease, stage 4 (severe): Secondary | ICD-10-CM | POA: Diagnosis not present

## 2024-04-22 DIAGNOSIS — I428 Other cardiomyopathies: Secondary | ICD-10-CM | POA: Diagnosis not present

## 2024-04-28 ENCOUNTER — Other Ambulatory Visit: Payer: Self-pay | Admitting: Cardiology

## 2024-04-28 DIAGNOSIS — E039 Hypothyroidism, unspecified: Secondary | ICD-10-CM

## 2024-04-30 MED ORDER — LEVOTHYROXINE SODIUM 125 MCG PO TABS: 125.0000 ug | ORAL_TABLET | Freq: Every day | ORAL | 0 refills | Status: AC

## 2024-05-01 ENCOUNTER — Telehealth: Payer: Self-pay | Admitting: Cardiology

## 2024-05-01 NOTE — Telephone Encounter (Signed)
 Spoke with the pharmacist and advised that it is okay to switch brands.

## 2024-05-01 NOTE — Telephone Encounter (Signed)
 Pt c/o medication issue:  1. Name of Medication:   levothyroxine  (SYNTHROID ) 125 MCG tablet    2. How are you currently taking this medication (dosage and times per day)? As written   3. Are you having a reaction (difficulty breathing--STAT)? No   4. What is your medication issue? Pharmacy called stating they are changing brands and wanted to make switch was going to be ok by the Dr please advise

## 2024-05-12 NOTE — Progress Notes (Signed)
 HPI: FU atrial fibrillation. He has a past history of paroxysmal atrial fibrillation and tachycardia mediated cardiomyopathy. He underwent mapping and ablation of atrial fib in 11/2012 by Dr. Kelsie. Carotid dopplers April 2017 normal. ABIs February 2025 noncompressible bilaterally with abnormal toe brachial index.  TEE February 2025 showed normal LV function, severe left atrial enlargement, moderate right atrial enlargement, moderate mitral regurgitation, moderate tricuspid regurgitation.  Had atrial fibrillation ablation February 2025.  Since last seen, his dyspnea, chest pain and or syncope.  No bleeding.  He feels as if his heart rate is running high.  He feels as though he is retaining fluid and has gained 8 pounds.  Current Outpatient Medications  Medication Sig Dispense Refill   acetaminophen  (TYLENOL ) 650 MG CR tablet Take 650 mg by mouth as needed.     amLODipine  (NORVASC ) 10 MG tablet Take 0.5 tablets (5 mg total) by mouth daily. 45 tablet 0   Ascorbic Acid (VITAMIN C PO) Take 500 tablets by mouth 2 (two) times daily. 500 mg     Cholecalciferol (VITAMIN D) 125 MCG (5000 UT) CAPS Take 5,000 Units by mouth in the morning.     Coenzyme Q10 (COQ10) 100 MG CAPS Take 100 mg by mouth every morning.     edoxaban  ([name] ) 60 MG TABS tablet Take 30 mg by mouth daily. 45 tablet 2   IRON  PO Take 65 mg by mouth daily at 12 noon.     levothyroxine  (SYNTHROID ) 125 MCG tablet Take 1 tablet (125 mcg total) by mouth daily before breakfast. 90 tablet 0   Magnesium Oxide 420 MG TABS Take 1 tablet by mouth daily.     metoprolol  succinate (TOPROL  XL) 25 MG 24 hr tablet Take 1 tablet (25 mg total) by mouth daily. 90 tablet 3   Misc Natural Products (OSTEO BI-FLEX JOINT SHIELD PO) Take 2 tablets by mouth every morning.     Multiple Vitamin (MULTIVITAMIN) tablet Take 1 tablet by mouth daily at 12 noon.     Omega-3 Fatty Acids (FISH OIL) 1200 MG CAPS Take 1,200 mg by mouth daily.     torsemide (DEMADEX) 20  MG tablet Take 20 mg by mouth in the morning.     zinc  gluconate 50 MG tablet Take 50 mg by mouth daily at 12 noon.     No current facility-administered medications for this visit.     Past Medical History:  Diagnosis Date   Arthritis    Atrial dilatation    mild biatrial dilitation   Bruises easily    CHF (congestive heart failure) (HCC)    CKD (chronic kidney disease), stage III (HCC)    Coronary artery disease    Dysrhythmia    atrial fibrillation   GERD (gastroesophageal reflux disease)    H/O hiatal hernia    Hypercholesterolemia    Hyperglycemia    Hypertension    Hypothyroidism    Mitral regurgitation    a. Mild by echo in 01/2014.   Nonischemic cardiomyopathy (HCC)    a. presumed to be tachycardia mediated. Varying EFs over the years from 35-40% to normal and back.   Paroxysmal atrial fibrillation (HCC)    a. H/o difficult to control. b. s/p PVI ablation 12-03-2012 by Dr Kelsie with recurrence afterwards, requiring period of amiodarone .   Peripheral edema    Sleep apnea    USES C PAP   Tear of medial meniscus of left knee    Tremor     Past Surgical History:  Procedure Laterality Date   ATRIAL FIBRILLATION ABLATION N/A 12/03/2012   Procedure: ATRIAL FIBRILLATION ABLATION;  Surgeon: Lynwood Rakers, MD;  Location: [facility] CATH LAB;  Service: Cardiovascular;  Laterality: N/A;   ATRIAL FIBRILLATION ABLATION     PVI by Dr Rakers   ATRIAL FIBRILLATION ABLATION N/A 08/22/2023   Procedure: ATRIAL FIBRILLATION ABLATION;  Surgeon: Nancey Eulas BRAVO, MD;  Location: [facility] INVASIVE CV LAB;  Service: Cardiovascular;  Laterality: N/A;   CARDIAC CATHETERIZATION     CARDIOVERSION N/A 09/04/2012   Procedure: CARDIOVERSION;  Surgeon: Debby Gallop, MD;  Location: [facility] ENDOSCOPY;  Service: Cardiovascular;  Laterality: N/A;   CARDIOVERSION N/A 02/11/2013   Procedure: CARDIOVERSION;  Surgeon: Debby Gallop, MD;  Location: [facility] ENDOSCOPY;  Service: Cardiovascular;  Laterality: N/A;    CARDIOVERSION N/A 12/07/2015   Procedure: CARDIOVERSION;  Surgeon: Vinie JAYSON Maxcy, MD;  Location: [facility] ENDOSCOPY;  Service: Cardiovascular;  Laterality: N/A;   CARDIOVERSION N/A 03/11/2018   Procedure: CARDIOVERSION;  Surgeon: Maranda Leim DEL, MD;  Location: [facility] ENDOSCOPY;  Service: Cardiovascular;  Laterality: N/A;   CARDIOVERSION N/A 09/23/2020   Procedure: CARDIOVERSION;  Surgeon: Okey Vina GAILS, MD;  Location: [facility] ENDOSCOPY;  Service: Cardiovascular;  Laterality: N/A;   CARDIOVERSION N/A 08/19/2021   Procedure: CARDIOVERSION;  Surgeon: Jeffrie Oneil JAYSON, MD;  Location: [facility] ENDOSCOPY;  Service: Cardiovascular;  Laterality: N/A;   CARDIOVERSION N/A 10/23/2022   Procedure: CARDIOVERSION;  Surgeon: Hobart Powell BRAVO, MD;  Location: [facility] INVASIVE CV LAB;  Service: Cardiovascular;  Laterality: N/A;   CARDIOVERSION N/A 04/03/2023   Procedure: CARDIOVERSION;  Surgeon: Lonni Slain, MD;  Location: [facility] INVASIVE CV LAB;  Service: Cardiovascular;  Laterality: N/A;   CATARACT EXTRACTION, BILATERAL     EYE SURGERY  2015,2016   bilateral cataract surgery with lens implants   HERNIA REPAIR  03/2005   JOINT REPLACEMENT  2005   total right hip replacement   kidney stent  1987, 02/2005   KIDNEY STONE SURGERY     2 stents placed   KNEE ARTHROSCOPY WITH DRILLING/MICROFRACTURE Left 11/20/2013   Procedure: KNEE ARTHROSCOPY WITH DRILLING/MICROFRACTURE;  Surgeon: Tanda DELENA Heading, MD;  Location: [facility] ORS;  Service: Orthopedics;  Laterality: Left;   KNEE ARTHROSCOPY WITH MEDIAL MENISECTOMY Left 11/20/2013   Procedure: LEFT KNEE ARTHROSCOPY WITH MEDIAL MENISECTOMY;  Surgeon: Tanda DELENA Heading, MD;  Location: [facility] ORS;  Service: Orthopedics;  Laterality: Left;  Microfracture of medial femoral chondyle and abrasion chondroplasty of the medial femoral chondyle   LITHOTRIPSY     x 2   PICC line placement  2007   quadrec  1999   LFT leg quad sx   right hip surgery  2007   irrigation and debridement from bacteria   TEE  WITHOUT CARDIOVERSION N/A 12/02/2012   Procedure: TRANSESOPHAGEAL ECHOCARDIOGRAM (TEE);  Surgeon: Vina GAILS Okey, MD;  Location: [facility] ENDOSCOPY;  Service: Cardiovascular;  Laterality: N/A;   TOTAL HIP ARTHROPLASTY Left 08/12/2015   Procedure: LEFT TOTAL HIP ARTHROPLASTY;  Surgeon: Tanda Heading, MD;  Location: [facility] ORS;  Service: Orthopedics;  Laterality: Left;   TRANSESOPHAGEAL ECHOCARDIOGRAM (CATH LAB) N/A 08/22/2023   Procedure: TRANSESOPHAGEAL ECHOCARDIOGRAM;  Surgeon: Nancey Eulas BRAVO, MD;  Location: [facility] INVASIVE CV LAB;  Service: Cardiovascular;  Laterality: N/A;    Social History   Socioeconomic History   Marital status: Married    Spouse name: Dickey   Number of children: 2   Years of education: Not on file   Highest education level: Some college, no degree  Occupational History  Occupation: Recruitment Consultant: RETIRED  Tobacco Use   Smoking status: Never   Smokeless tobacco: Never   Tobacco comments:    Never smoked 04/05/23  Vaping Use   Vaping status: Never Used  Substance and Sexual Activity   Alcohol  use: No   Drug use: No   Sexual activity: Not on file  Other Topics Concern   Not on file  Social History Narrative   Lives in [place] with spouse.  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   [employer] [facility]:   Has worked extensively in [employer]. Significant asbestos asbestos exposure. No bird or mold exposure.       Right handed   Social Drivers of Corporate Investment Banker Strain: Not on file  Food Insecurity: Not on file  Transportation Needs: Not on file  Physical Activity: Not on file  Stress: Not on file  Social Connections: Not on file  Intimate Partner Violence: Not on file    Family History  Problem Relation Age of Onset   Cancer Mother 42       CANCER   Heart disease Father 70       HEART PROBLEMS   Lupus Brother        of the skin    ROS: no fevers or chills, productive cough,  hemoptysis, dysphasia, odynophagia, melena, hematochezia, dysuria, hematuria, rash, seizure activity, orthopnea, PND, pedal edema, claudication. Remaining systems are negative.  Physical Exam: Well-developed well-nourished in no acute distress.  Skin is warm and dry.  HEENT is normal.  Neck is supple.  Chest is clear to auscultation with normal expansion.  Cardiovascular exam is irregular and tachycardic Abdominal exam nontender or distended. No masses palpated. Extremities show trace edema. neuro grossly intact  EKG Interpretation Date/Time:  Tuesday May 20 2024 09:40:17 EST Ventricular Rate:  123 PR Interval:    QRS Duration:  90 QT Interval:  338 QTC Calculation: 483 R Axis:   53  Text Interpretation: Atrial fibrillation with rapid ventricular response Nonspecific ST and T wave abnormality Confirmed by Pietro Rogue ([id number]) on 05/20/2024 9:53:25 AM    A/P  1 paroxysmal atrial fibrillation-patient had recurrence of atrial fibrillation following recent ablation; seen by Dr. Nancey in rate control and anticoagulation was felt to be appropriate as risk of amiodarone  outweighed benefit.  Continue Toprol  and edoxaban .  His heart rate is high today.  Increase Toprol  to 50 mg daily.  In 2 weeks check 3-day Zio patch to make sure rate is adequately controlled.  2 hypertension-patient's blood pressure is controlled.  Given that I am increasing metoprolol  we will discontinue amlodipine .  Will adjust based on follow-up readings.  3 mitral regurgitation-moderate on most recent echocardiogram.  Will repeat study February 2026.  4 chronic stage IIIb kidney disease-managed by nephrology.  5 lower extremity edema-minimal increase in he states increased weight gain.  I have asked him to take an extra Demadex daily as needed.  Follow renal function closely.  Rogue Pietro, MD

## 2024-05-20 ENCOUNTER — Ambulatory Visit: Attending: Cardiology | Admitting: Cardiology

## 2024-05-20 ENCOUNTER — Encounter: Payer: Self-pay | Admitting: Cardiology

## 2024-05-20 VITALS — BP 126/72 | HR 103 | Resp 16 | Ht 69.5 in | Wt 168.0 lb

## 2024-05-20 DIAGNOSIS — I10 Essential (primary) hypertension: Secondary | ICD-10-CM | POA: Diagnosis not present

## 2024-05-20 DIAGNOSIS — I4811 Longstanding persistent atrial fibrillation: Secondary | ICD-10-CM

## 2024-05-20 DIAGNOSIS — R609 Edema, unspecified: Secondary | ICD-10-CM | POA: Diagnosis not present

## 2024-05-20 MED ORDER — METOPROLOL SUCCINATE ER 50 MG PO TB24: 50.0000 mg | ORAL_TABLET | Freq: Every day | ORAL | 3 refills | Status: DC

## 2024-05-20 NOTE — Patient Instructions (Signed)
 Medication Instructions:   STOP AMLODIPINE   INCREASE METOPROLOL  TO 50 MG ONCE DAILY= 2 OF THE 25 MG TABLETS ONCE DAILY  XARELTO  OR ELIQUIS  FOR A REPLACEMENT FOR [name]    *If you need a refill on your cardiac medications before your next appointment, please call your pharmacy*   Testing/Procedures:  ZIO XT- Long Term Monitor Instructions WILL BE MAILED IN 2 WEEKS  Your physician has requested you wear a ZIO patch monitor for 3 days.  This is a single patch monitor. [employer] supplies one patch monitor per enrollment. Additional stickers are not available. Please do not apply patch if you will be having a Nuclear Stress Test,  Echocardiogram, Cardiac CT, MRI, or Chest Xray during the period you would be wearing the  monitor. The patch cannot be worn during these tests. You cannot remove and re-apply the  ZIO XT patch monitor.  Your ZIO patch monitor will be mailed 3 day USPS to your address on file. It may take 3-5 days  to receive your monitor after you have been enrolled.  Once you have received your monitor, please review the enclosed instructions. Your monitor  has already been registered assigning a specific monitor serial # to you.  Billing and Patient Assistance Program Information  We have supplied [employer] with any of your insurance information on file for billing purposes. [employer] offers a sliding scale Patient Assistance Program for patients that do not have  insurance, or whose insurance does not completely cover the cost of the ZIO monitor.  You must apply for the Patient Assistance Program to qualify for this discounted rate.  To apply, please call [employer] at [phone], select option 4, select option 2, ask to apply for  Patient Assistance Program. Meredeth will ask your household income, and how many people  are in your household. They will quote your out-of-pocket cost based on that information.  [employer] will also be able to set up a 39-month, interest-free payment  plan if needed.  Applying the monitor   Shave hair from upper left chest.  Hold abrader disc by orange tab. Rub abrader in 40 strokes over the upper left chest as  indicated in your monitor instructions.  Clean area with 4 enclosed alcohol  pads. Let dry.  Apply patch as indicated in monitor instructions. Patch will be placed under collarbone on left  side of chest with arrow pointing upward.  Rub patch adhesive wings for 2 minutes. Remove white label marked 1. Remove the white  label marked 2. Rub patch adhesive wings for 2 additional minutes.  While looking in a mirror, press and release button in center of patch. A small green light will  flash 3-4 times. This will be your only indicator that the monitor has been turned on.  Do not shower for the first 24 hours. You may shower after the first 24 hours.  Press the button if you feel a symptom. You will hear a small click. Record Date, Time and  Symptom in the Patient Logbook.  When you are ready to remove the patch, follow instructions on the last 2 pages of Patient  Logbook. Stick patch monitor onto the last page of Patient Logbook.  Place Patient Logbook in the blue and white box. Use locking tab on box and tape box closed  securely. The blue and white box has prepaid postage on it. Please place it in the mailbox as  soon as possible. Your physician should have your test results approximately 7 days after the  monitor has been mailed back to [place].  Call [facility] Customer Care at [phone] if you have questions regarding  your ZIO XT patch monitor. Call them immediately if you see an orange light blinking on your  monitor.  If your monitor falls off in less than 4 days, contact our Monitor department at [phone].  If your monitor becomes loose or falls off after 4 days call [employer] at [phone] for  suggestions on securing your monitor   Follow-Up: At [facility], you and your health needs  are our priority.  As part of our continuing mission to provide you with exceptional heart care, our providers are all part of one team.  This team includes your primary Cardiologist (physician) and Advanced Practice Providers or APPs (Physician Assistants and Nurse Practitioners) who all work together to provide you with the care you need, when you need it.  Your next appointment:   8-12 week(s)  Provider:   One of our Advanced Practice Providers (APPs): Morse Clause, PA-C  Lamarr Satterfield, NP Miriam Shams, NP  Olivia Pavy, PA-C Josefa Beauvais, NP  Leontine Salen, PA-C Orren Fabry, PA-C  Merriam Woods, PA-C Ernest Dick, NP  Damien Braver, NP Jon Hails, PA-C  Waddell Donath, PA-C    Dayna Dunn, PA-C  Scott Weaver, PA-C Lum Louis, NP Katlyn West, NP Callie Goodrich, PA-C  Xika Zhao, NP Sheng Haley, PA-C    Kathleen Johnson, PA-C   Then, Redell Shallow, MD will plan to see you again in 6 month(s).

## 2024-05-21 ENCOUNTER — Ambulatory Visit: Payer: Self-pay | Admitting: Cardiology

## 2024-05-21 LAB — CBC
Hematocrit: 33.7 % — ABNORMAL LOW (ref 37.5–51.0)
Hemoglobin: 10.8 g/dL — ABNORMAL LOW (ref 13.0–17.7)
MCH: 31.3 pg (ref 26.6–33.0)
MCHC: 32 g/dL (ref 31.5–35.7)
MCV: 98 fL — ABNORMAL HIGH (ref 79–97)
Platelets: 226 x10E3/uL (ref 150–450)
RBC: 3.45 x10E6/uL — ABNORMAL LOW (ref 4.14–5.80)
RDW: 12 % (ref 11.6–15.4)
WBC: 11.8 x10E3/uL — ABNORMAL HIGH (ref 3.4–10.8)

## 2024-05-21 LAB — BASIC METABOLIC PANEL WITH GFR
BUN/Creatinine Ratio: 18 (ref 10–24)
BUN: 43 mg/dL — ABNORMAL HIGH (ref 8–27)
CO2: 26 mmol/L (ref 20–29)
Calcium: 10.2 mg/dL (ref 8.6–10.2)
Chloride: 98 mmol/L (ref 96–106)
Creatinine, Ser: 2.35 mg/dL — ABNORMAL HIGH (ref 0.76–1.27)
Glucose: 190 mg/dL — ABNORMAL HIGH (ref 70–99)
Potassium: 3.9 mmol/L (ref 3.5–5.2)
Sodium: 143 mmol/L (ref 134–144)
eGFR: 26 mL/min/1.73 — ABNORMAL LOW (ref >59)

## 2024-06-05 ENCOUNTER — Other Ambulatory Visit: Payer: Self-pay | Admitting: *Deleted

## 2024-06-05 ENCOUNTER — Ambulatory Visit: Attending: Cardiology

## 2024-06-05 DIAGNOSIS — I4811 Longstanding persistent atrial fibrillation: Secondary | ICD-10-CM

## 2024-06-05 NOTE — Progress Notes (Unsigned)
 Enrolled patient for a 3 day Zio XT monitor to be mailed to patients home

## 2024-06-23 ENCOUNTER — Ambulatory Visit: Payer: Self-pay | Admitting: Cardiology

## 2024-06-23 DIAGNOSIS — I4811 Longstanding persistent atrial fibrillation: Secondary | ICD-10-CM

## 2024-06-23 MED ORDER — METOPROLOL SUCCINATE ER 50 MG PO TB24: 75.0000 mg | ORAL_TABLET | Freq: Every day | ORAL | Status: DC

## 2024-07-16 NOTE — Progress Notes (Unsigned)
 "  Cardiology Office Note    Date:  07/17/2024  ID:  Charles, [name] [birth year], MRN [id number] PCP:  Dayna Motto, DO  Cardiologist:  Redell Shallow, MD  Electrophysiologist:  Eulas FORBES Furbish, MD   Chief Complaint: Follow up for afib with RVR   History of Present Illness: .    Charles [name] is a 84 y.o. male with visit-pertinent history of paroxysmal atrial fibrillation and tachycardia mediated cardiomyopathy.  Patient underwent mapping and ablation of A-fib in 11/2012 by Dr. Kelsie.  Carotid Dopplers in April 2017 were normal.  ABIs in February 2025 with noncompressible bilaterally with abnormal toe brachial index.  TEE in February 2025 showed normal LV function, severe left atrial enlargement, moderate right atrial enlargement, moderate mitral regurgitation, moderate tricuspid regurgitation.  Patient had another atrial fibrillation ablation in February 2025.  Following ablation patient continued to have episodes of atrial fibrillation, amiodarone  was.  Discontinued after abnormal PFTs then restarted in March 2025 given recurrence of A-fib however was again discontinued in June 2025 given failure to main sinus rhythm.  Patient is now on a rate control strategy  Patient was last seen in clinic by Dr. Shallow on 05/20/2024.  Patient was in atrial fibrillation with RVR, felt as though he had been retaining fluid.  Patient's amlodipine  was discontinued as metoprolol  was increased.  Cardiac monitor showed the atrial fibrillation occurred with a 100% burden ranging from 61 to 174 bpm, average of 106 bpm.  Patient's metoprolol  was increased to 75 mg daily.  Today he presents for follow-up.  He reports that he has been doing okay.  He notes that his heart rate has been all over the place ranging from 70 to 130 bpm.  He denies any chest pain, lower extremity edema, orthopnea or PND.  He does note that he has had some increased abdominal swelling the last few days, increased his torsemide to 20  mg twice daily with increased urine output.  Patient denies any increased palpitations, presyncope or syncope.  He denies any dizziness or lightheadedness.  He denies any bleeding problems. ROS: .   Today he denies chest pain, lower extremity edema, palpitations, melena, hematuria, hemoptysis, diaphoresis, presyncope, syncope, orthopnea, and PND.  All other systems are reviewed and otherwise negative. Studies Reviewed: SABRA   EKG:  EKG is ordered today, personally reviewed, demonstrating  EKG Interpretation Date/Time:  Thursday July 17 2024 11:05:37 EST Ventricular Rate:  123 PR Interval:    QRS Duration:  88 QT Interval:  332 QTC Calculation: 475 R Axis:   56  Text Interpretation: Atrial fibrillation with rapid ventricular response When compared with ECG of 20-May-2024 09:40, No significant change was found Confirmed by Haskell Rihn [phone]) on 07/17/2024 2:05:41 PM   CV Studies: Cardiac studies reviewed are outlined and summarized above. Otherwise please see EMR for full report. Cardiac Studies & Procedures   ______________________________________________________________________________________________     ECHOCARDIOGRAM  ECHOCARDIOGRAM COMPLETE 11/30/2022  Narrative ECHOCARDIOGRAM REPORT    Patient Name:   Charles [name] Date of Exam: 11/30/2022 Medical Rec #:  [id number]        Height:       69.0 in Accession #:    [id number]       Weight:       168.0 lb Date of Birth:  05-19-1941        BSA:          1.918 m Patient Age:    36 years  BP:           122/52 mmHg Patient Gender: M                HR:           58 bpm. Exam Location:  [address]  Procedure: 2D Echo, Cardiac Doppler, Color Doppler, 3D Echo and Strain Analysis  Indications:    I48.91* Unspecified atrial fibrillation  History:        Patient has prior history of Echocardiogram examinations, most recent 03/16/2022. CHF, CAD; Risk Factors:Sleep Apnea, Hypertension and Dyslipidemia. Tachycardia. Chronic  kidney disease. Hyperglycemia. NICM. Peripheral edema.  Sonographer:    Jon Hacker RCS Referring Phys: [id number] AUGUSTUS E MEALOR  IMPRESSIONS   1. Left ventricular ejection fraction, by estimation, is 55 to 60%. Left ventricular ejection fraction by 3D volume is 59 %. The left ventricle has normal function. The left ventricle has no regional wall motion abnormalities. Left ventricular diastolic parameters were normal. 2. Right ventricular systolic function is normal. The right ventricular size is normal. There is moderately elevated pulmonary artery systolic pressure. The estimated right ventricular systolic pressure is 45.5 mmHg. 3. Left atrial size was severely dilated. 4. Right atrial size was mildly dilated. 5. The mitral valve is degenerative. Mild to moderate mitral valve regurgitation. 6. The tricuspid valve is degenerative. Tricuspid valve regurgitation is moderate to severe. 7. The aortic valve is tricuspid. Aortic valve regurgitation is trivial. Aortic valve sclerosis/calcification is present, without any evidence of aortic stenosis. 8. The inferior vena cava is normal in size with greater than 50% respiratory variability, suggesting right atrial pressure of 3 mmHg.  Comparison(s): Changes from prior study are noted. 03/16/2022: LVEF 55-60%, RVSP 69.8 mmHg, severe LAE, mild RAE, moderate MR, moderate TR.  FINDINGS Left Ventricle: Left ventricular ejection fraction, by estimation, is 55 to 60%. Left ventricular ejection fraction by 3D volume is 59 %. The left ventricle has normal function. The left ventricle has no regional wall motion abnormalities. The left ventricular internal cavity size was normal in size. There is no left ventricular hypertrophy. Left ventricular diastolic parameters were normal.  Right Ventricle: The right ventricular size is normal. No increase in right ventricular wall thickness. Right ventricular systolic function is normal. There is moderately elevated  pulmonary artery systolic pressure. The tricuspid regurgitant velocity is 3.26 m/s, and with an assumed right atrial pressure of 3 mmHg, the estimated right ventricular systolic pressure is 45.5 mmHg.  Left Atrium: Left atrial size was severely dilated.  Right Atrium: Right atrial size was mildly dilated.  Pericardium: There is no evidence of pericardial effusion.  Mitral Valve: The mitral valve is degenerative in appearance. Mild to moderate mitral annular calcification. Mild to moderate mitral valve regurgitation.  Tricuspid Valve: The tricuspid valve is degenerative in appearance. Tricuspid valve regurgitation is moderate to severe.  Aortic Valve: The aortic valve is tricuspid. Aortic valve regurgitation is trivial. Aortic valve sclerosis/calcification is present, without any evidence of aortic stenosis.  Pulmonic Valve: The pulmonic valve was grossly normal. Pulmonic valve regurgitation is trivial.  Aorta: The aortic root and ascending aorta are structurally normal, with no evidence of dilitation.  Venous: The inferior vena cava is normal in size with greater than 50% respiratory variability, suggesting right atrial pressure of 3 mmHg.  IAS/Shunts: No atrial level shunt detected by color flow Doppler.   LEFT VENTRICLE PLAX 2D LVIDd:         5.30 cm         Diastology  LVIDs:         3.40 cm         LV e' medial:    8.92 cm/s LV PW:         1.20 cm         LV E/e' medial:  11.9 LV IVS:        0.90 cm         LV e' lateral:   14.00 cm/s LVOT diam:     1.90 cm         LV E/e' lateral: 7.6 LV SV:         95 LV SV Index:   49 LVOT Area:     2.84 cm        3D Volume EF LV 3D EF:    Left ventricul ar ejection fraction by 3D volume is 59 %.  3D Volume EF: 3D EF:        59 % LV EDV:       219 ml LV ESV:       89 ml LV SV:        129 ml  RIGHT VENTRICLE RV Basal diam:  3.70 cm RV S prime:     13.80 cm/s TAPSE (M-mode): 2.6 cm RVSP:           45.5 mmHg  LEFT ATRIUM              Index        RIGHT ATRIUM           Index LA diam:        3.70 cm 1.93 cm/m   RA Pressure: 3.00 mmHg LA Vol (A2C):   68.9 ml 35.91 ml/m  RA Area:     18.40 cm LA Vol (A4C):   98.7 ml 51.45 ml/m  RA Volume:   50.30 ml  26.22 ml/m LA Biplane Vol: 91.0 ml 47.43 ml/m AORTIC VALVE LVOT Vmax:   127.00 cm/s LVOT Vmean:  86.400 cm/s LVOT VTI:    0.334 m  AORTA Ao Root diam: 3.00 cm Ao Asc diam:  3.40 cm  MITRAL VALVE                TRICUSPID VALVE MV Area (PHT): 5.09 cm     TR Peak grad:   42.5 mmHg MV Decel Time: 149 msec     TR Vmax:        326.00 cm/s MR Peak grad: 92.5 mmHg     Estimated RAP:  3.00 mmHg MR Mean grad: 62.0 mmHg     RVSP:           45.5 mmHg MR Vmax:      481.00 cm/s MR Vmean:     372.0 cm/s    SHUNTS MV E velocity: 106.00 cm/s  Systemic VTI:  0.33 m MV A velocity: 66.00 cm/s   Systemic Diam: 1.90 cm MV E/A ratio:  1.61  Vinie Maxcy MD Electronically signed by Vinie Maxcy MD Signature Date/Time: 11/30/2022/8:50:21 PM    Final   TEE  ECHO TEE 08/22/2023  Narrative TRANSESOPHOGEAL ECHO REPORT    Patient Name:   Charles [name] Date of Exam: 08/22/2023 Medical Rec #:  [id number]        Height:       69.0 in Accession #:    [id number]       Weight:       175.0 lb Date of Birth:  1940-09-19  BSA:          1.952 m Patient Age:    82 years         BP:           133/70 mmHg Patient Gender: M                HR:           90 bpm. Exam Location:  Inpatient  Procedure: Transesophageal Echo, 3D Echo, Color Doppler and Cardiac Doppler (Both Spectral and Color Flow Doppler were utilized during procedure).  Indications:     I48.91* Unspecified atrial fibrillation  History:         Patient has prior history of Echocardiogram examinations, most recent 11/30/2022. CHF, Arrythmias:Atrial Fibrillation; Risk Factors:Hypertension, Sleep Apnea and Dyslipidemia.  Sonographer:     Damien Senior RDCS Referring Phys:  NANCEY SCOTTS, E Diagnosing Phys:  Toribio Fuel MD   Sonographer Comments: In cath prior to ablation   PROCEDURE: After discussion of the risks and benefits of a TEE, an informed consent was obtained from the patient. The transesophogeal probe was passed without difficulty through the esophogus of the patient. Sedation performed by different physician. The patient was monitored while under deep sedation. The patient developed no complications during the procedure.  IMPRESSIONS   1. Left ventricular ejection fraction, by estimation, is 60 to 65%. The left ventricle has normal function. 2. Right ventricular systolic function is normal. The right ventricular size is normal. 3. Left atrial size was severely dilated. No left atrial/left atrial appendage thrombus was detected. 4. Right atrial size was moderately dilated. 5. The mitral valve is degenerative. Moderate mitral valve regurgitation. 6. The septal leaflet of the tricuspid valve is calcifired and appears to prolapse with moderate eccentic TR. The tricuspid valve is abnormal. Tricuspid valve regurgitation is moderate. 7. The aortic valve is tricuspid. There is mild calcification of the aortic valve. Aortic valve regurgitation is not visualized. 8. 3D performed of the tricuspid valve.  FINDINGS Left Ventricle: Left ventricular ejection fraction, by estimation, is 60 to 65%. The left ventricle has normal function. The left ventricular internal cavity size was normal in size.  Right Ventricle: The right ventricular size is normal. No increase in right ventricular wall thickness. Right ventricular systolic function is normal.  Left Atrium: Left atrial size was severely dilated. No left atrial/left atrial appendage thrombus was detected.  Right Atrium: Right atrial size was moderately dilated.  Pericardium: There is no evidence of pericardial effusion.  Mitral Valve: The mitral valve is degenerative in appearance. Moderate mitral valve regurgitation.  Tricuspid  Valve: The septal leaflet of the tricuspid valve is calcifired and appears to prolapse with moderate eccentic TR. The tricuspid valve is abnormal. Tricuspid valve regurgitation is moderate.  Aortic Valve: The aortic valve is tricuspid. There is mild calcification of the aortic valve. Aortic valve regurgitation is not visualized.  Pulmonic Valve: The pulmonic valve was normal in structure. Pulmonic valve regurgitation is not visualized.  Aorta: The aortic root is normal in size and structure.  IAS/Shunts: No atrial level shunt detected by color flow Doppler.  Additional Comments: 3D was performed not requiring image post processing on an independent workstation and was abnormal. Spectral Doppler performed.  TRICUSPID VALVE TR Peak grad:   13.8 mmHg TR Vmax:        186.00 cm/s  Toribio Fuel MD Electronically signed by Toribio Fuel MD Signature Date/Time: 08/22/2023/3:20:56 PM    Final  MONITORS  LONG TERM MONITOR (3-14  DAYS) 06/23/2024  Narrative Patch Wear Time:  3 days and 1 hours (2025-12-17T14:11:02-499 to 2025-12-20T15:34:21-0500)  1 run of Ventricular Tachycardia occurred lasting 5 beats with a max rate of 167 bpm (avg 158 bpm). Atrial Fibrillation occurred continuously (100% burden), ranging from 61-174 bpm (avg of 106 bpm). Isolated VEs were rare (<1.0%), VE Couplets were rare (<1.0%), and no VE Triplets were present.  Atrial fibrillation with PVCs or aberrantly conducted beats rate mildly elevated; 5 beats NSVT. Redell Shallow       ______________________________________________________________________________________________       Current Reported Medications:.    Active Medications[1]  Physical Exam:    VS:  BP 118/68   Pulse (!) 123   Ht 5' 9.5 (1.765 m)   Wt 171 lb 3.2 oz (77.7 kg)   SpO2 99%   BMI 24.92 kg/m    Wt Readings from Last 3 Encounters:  07/17/24 171 lb 3.2 oz (77.7 kg)  05/20/24 168 lb (76.2 kg)  02/20/24 171 lb 3.2 oz (77.7  kg)    GEN: Well nourished, well developed in no acute distress NECK: No JVD; No carotid bruits CARDIAC: IRIR, no murmurs, rubs, gallops RESPIRATORY:  Clear to auscultation without rales, wheezing or rhonchi  ABDOMEN: Soft, non-tender, non-distended EXTREMITIES:  No edema; No acute deformity     Asessement and Plan:.    Persistent AF: S/p atrial fibrillation ablation in June 2014 by Dr. Kelsie.  Patient was previously on amiodarone  however was found to have severely reduced DLCO on pulmonary function test.  Patient underwent a second atrial fibrillation ablation in February 2025.  Unfortunately had recurrence of atrial fibrillation on follow-up in March.  Amiodarone  was again resumed however patient remained in atrial fibrillation.  When seen in office by Dr. Shallow in 04/2024 heart rate was not well-controlled, metoprolol  increased cardiac monitor showed some improvement however average heart rate 106 bpm metoprolol  was again increased.  EKG today indicates atrial fibrillation at 123 bpm, patient reports that his heart rate has been all over the place but notes it does stay typically consistently high, has not seen a heart rate below 70 bpm.  Patient overall asymptomatic, does note some slight swelling in his abdomen, has increased his torsemide.  Patient notes some baseline fatigue and dyspnea on exertion that is unchanged.  Heart rate improved to 105 bpm prior to leaving. Will have patient stop metoprolol  succinate 75 mg daily and start metoprolol  tartrate 50 mg twice daily with soon follow-up with EP.  Patient with reported allergy to diltiazem resulting in rash and given history of tachycardia induced cardiomyopathy and increased abdominal swelling will not start diltiazem at this time.  Reviewed ED precautions with patient.  Check CBC, c-Met, TSH with reflex and magnesium level.  Mitral regurgitation: Moderate on most recent echocardiogram.  Repeat study planned for February 2026.   Chronic  stage IIIb kidney disease: Managed by nephrology. Check CMET today, patient to follow up with nephrology next week.   History of tachycardia induced cardiomyopathy: Recovered. Last echocardiogram indicated LVEF 60 to 65%, RV size was normal.  Patient notes that in the last week he has noted increased abdominal swelling, started increased dose of Lasix  yesterday with increased urine output however has not noted significant abdominal distention changes.  He denies worsening shortness of breath, lower extremity edema, orthopnea or PND.  On exam he overall appears euvolemic and well compensated.  Will start on metoprolol  tartrate for rate control as noted above, continue torsemide.  Check c-Met as noted  above.   Disposition: F/u with EP next available appointment.   Signed, Chandni Gagan D Deshayla Empson, NP       [1]  Current Meds  Medication Sig   acetaminophen  (TYLENOL ) 650 MG CR tablet Take 650 mg by mouth as needed.   Ascorbic Acid (VITAMIN C PO) Take 500 tablets by mouth 2 (two) times daily. 500 mg (Patient taking differently: Take 500 tablets by mouth daily. 500 mg)   Cholecalciferol (VITAMIN D) 125 MCG (5000 UT) CAPS Take 5,000 Units by mouth in the morning.   Coenzyme Q10 (COQ10) 100 MG CAPS Take 100 mg by mouth every morning.   edoxaban  ([name] ) 60 MG TABS tablet Take 30 mg by mouth daily.   IRON  PO Take 65 mg by mouth daily at 12 noon.   levothyroxine  (SYNTHROID ) 125 MCG tablet Take 1 tablet (125 mcg total) by mouth daily before breakfast.   Magnesium Oxide 420 MG TABS Take 1 tablet by mouth daily.   metoprolol  tartrate (LOPRESSOR ) 50 MG tablet Take 1 tablet (50 mg total) by mouth 2 (two) times daily.   Misc Natural Products (OSTEO BI-FLEX JOINT SHIELD PO) Take 2 tablets by mouth every morning.   Omega-3 Fatty Acids (FISH OIL) 1200 MG CAPS Take 1,200 mg by mouth daily.   torsemide (DEMADEX) 20 MG tablet Take 20 mg by mouth in the morning.   zinc  gluconate 50 MG tablet Take 50 mg by mouth daily at 12  noon.   [DISCONTINUED] metoprolol  succinate (TOPROL  XL) 50 MG 24 hr tablet Take 1.5 tablets (75 mg total) by mouth daily.   "

## 2024-07-17 ENCOUNTER — Ambulatory Visit: Attending: Cardiology | Admitting: Cardiology

## 2024-07-17 ENCOUNTER — Encounter: Payer: Self-pay | Admitting: Cardiology

## 2024-07-17 VITALS — BP 118/68 | HR 123 | Ht 69.5 in | Wt 171.2 lb

## 2024-07-17 DIAGNOSIS — R609 Edema, unspecified: Secondary | ICD-10-CM | POA: Diagnosis not present

## 2024-07-17 DIAGNOSIS — D6869 Other thrombophilia: Secondary | ICD-10-CM | POA: Diagnosis not present

## 2024-07-17 DIAGNOSIS — I10 Essential (primary) hypertension: Secondary | ICD-10-CM | POA: Diagnosis not present

## 2024-07-17 DIAGNOSIS — I428 Other cardiomyopathies: Secondary | ICD-10-CM

## 2024-07-17 DIAGNOSIS — I4811 Longstanding persistent atrial fibrillation: Secondary | ICD-10-CM

## 2024-07-17 DIAGNOSIS — Z79899 Other long term (current) drug therapy: Secondary | ICD-10-CM | POA: Diagnosis not present

## 2024-07-17 DIAGNOSIS — I340 Nonrheumatic mitral (valve) insufficiency: Secondary | ICD-10-CM | POA: Diagnosis not present

## 2024-07-17 MED ORDER — METOPROLOL TARTRATE 50 MG PO TABS: 50.0000 mg | ORAL_TABLET | Freq: Two times a day (BID) | ORAL | 11 refills | Status: DC

## 2024-07-17 NOTE — Patient Instructions (Addendum)
 Medication Instructions:  STOP Metoprolol  Succinate  START: Metoprolol  Tartrate 50 mg (1 tablet) twice daily  *If you need a refill on your cardiac medications before your next appointment, please call your pharmacy*  Lab Work: TODAY: CMET, Magnesium, CBC, TSH ref on abnormal to Free T4  If you have labs (blood work) drawn today and your tests are completely normal, you will receive your results only by: MyChart Message (if you have MyChart) OR A paper copy in the mail If you have any lab test that is abnormal or we need to change your treatment, we will call you to review the results.  Testing/Procedures: ( IN late FEB- early March 2026) Your physician has requested that you have an echocardiogram. Echocardiography is a painless test that uses sound waves to create images of your heart. It provides your doctor with information about the size and shape of your heart and how well your hearts chambers and valves are working. This procedure takes approximately one hour. There are no restrictions for this procedure. Please do NOT wear cologne, perfume, aftershave, or lotions (deodorant is allowed). Please arrive 15 minutes prior to your appointment time.  Please note: We ask at that you not bring children with you during ultrasound (echo/ vascular) testing. Due to room size and safety concerns, children are not allowed in the ultrasound rooms during exams. Our front office staff cannot provide observation of children in our lobby area while testing is being conducted. An adult accompanying a patient to their appointment will only be allowed in the ultrasound room at the discretion of the ultrasound technician under special circumstances. We apologize for any inconvenience.   Follow-Up: At [facility], you and your health needs are our priority.  As part of our continuing mission to provide you with exceptional heart care, our providers are all part of one team.  This team includes your  primary Cardiologist (physician) and Advanced Practice Providers or APPs (Physician Assistants and Nurse Practitioners) who all work together to provide you with the care you need, when you need it.  Your next appointment:   Follow up with EP urgently in 1 week with MD or APP  Follow up with Dr. Pietro in 4 months

## 2024-07-18 ENCOUNTER — Ambulatory Visit: Payer: Self-pay | Admitting: Cardiology

## 2024-07-18 DIAGNOSIS — Z79899 Other long term (current) drug therapy: Secondary | ICD-10-CM

## 2024-07-18 LAB — CBC
Hematocrit: 36.7 % — ABNORMAL LOW (ref 37.5–51.0)
Hemoglobin: 12 g/dL — ABNORMAL LOW (ref 13.0–17.7)
MCH: 32.4 pg (ref 26.6–33.0)
MCHC: 32.7 g/dL (ref 31.5–35.7)
MCV: 99 fL — ABNORMAL HIGH (ref 79–97)
NRBC: 1 % — ABNORMAL HIGH (ref 0–0)
Platelets: 204 x10E3/uL (ref 150–450)
RBC: 3.7 x10E6/uL — ABNORMAL LOW (ref 4.14–5.80)
RDW: 13.8 % (ref 11.6–15.4)
WBC: 15.2 x10E3/uL — ABNORMAL HIGH (ref 3.4–10.8)

## 2024-07-18 LAB — TSH RFX ON ABNORMAL TO FREE T4: TSH: 2.07 u[IU]/mL (ref 0.450–4.500)

## 2024-07-18 LAB — COMPREHENSIVE METABOLIC PANEL WITH GFR
ALT: 63 IU/L — ABNORMAL HIGH (ref 0–44)
AST: 38 IU/L (ref 0–40)
Albumin: 4.5 g/dL (ref 3.7–4.7)
Alkaline Phosphatase: 99 IU/L (ref 48–129)
BUN/Creatinine Ratio: 28 — AB (ref 10–24)
BUN: 65 mg/dL — ABNORMAL HIGH (ref 8–27)
Bilirubin Total: 1 mg/dL (ref 0.0–1.2)
CO2: 26 mmol/L (ref 20–29)
Calcium: 9.6 mg/dL (ref 8.6–10.2)
Chloride: 99 mmol/L (ref 96–106)
Creatinine, Ser: 2.35 mg/dL — AB (ref 0.76–1.27)
Globulin, Total: 2.4 g/dL (ref 1.5–4.5)
Glucose: 205 mg/dL — ABNORMAL HIGH (ref 70–99)
Potassium: 4.2 mmol/L (ref 3.5–5.2)
Sodium: 145 mmol/L — ABNORMAL HIGH (ref 134–144)
Total Protein: 6.9 g/dL (ref 6.0–8.5)
eGFR: 26 mL/min/1.73 — AB

## 2024-07-18 LAB — MAGNESIUM: Magnesium: 2.7 mg/dL — ABNORMAL HIGH (ref 1.6–2.3)

## 2024-07-23 ENCOUNTER — Encounter: Payer: Self-pay | Admitting: Cardiovascular Disease

## 2024-07-23 ENCOUNTER — Ambulatory Visit: Attending: Cardiovascular Disease | Admitting: Cardiovascular Disease

## 2024-07-23 VITALS — BP 114/60 | HR 116 | Ht 69.5 in | Wt 169.0 lb

## 2024-07-23 DIAGNOSIS — I4819 Other persistent atrial fibrillation: Secondary | ICD-10-CM

## 2024-07-23 DIAGNOSIS — I4821 Permanent atrial fibrillation: Secondary | ICD-10-CM

## 2024-07-23 MED ORDER — METOPROLOL SUCCINATE ER 50 MG PO TB24: 50.0000 mg | ORAL_TABLET | Freq: Two times a day (BID) | ORAL | 3 refills | Status: AC

## 2024-07-23 NOTE — Patient Instructions (Signed)
 Medication Instructions:  Your physician has recommended you make the following change in your medication:   ** Stop Metoprolol  Tartrate  ** Begin Metoprolol  Succinate 50mg  - 1 tablet by mouth twice daily.  *If you need a refill on your cardiac medications before your next appointment, please call your pharmacy*  Lab Work: None ordered.  If you have labs (blood work) drawn today and your tests are completely normal, you will receive your results only by: MyChart Message (if you have MyChart) OR A paper copy in the mail If you have any lab test that is abnormal or we need to change your treatment, we will call you to review the results.  Testing/Procedures: None ordered.   Follow-Up: At [facility], you and your health needs are our priority.  As part of our continuing mission to provide you with exceptional heart care, our providers are all part of one team.  This team includes your primary Cardiologist (physician) and Advanced Practice Providers or APPs (Physician Assistants and Nurse Practitioners) who all work together to provide you with the care you need, when you need it.  Your next appointment:   12 months with Charlies Arthur, PA_C

## 2024-07-23 NOTE — Progress Notes (Signed)
 " Electrophysiology Office Note:    Date:  07/23/2024   ID:  Holten, Spano 21-Mar-1941, MRN [id number]  PCP:  Dayna Motto, DO   [facility] Providers Cardiologist:  Redell Shallow, MD Electrophysiologist:  Eulas FORBES Furbish, MD     Referring MD: Dayna Motto, DO   History of Present Illness:    Charles [name] is a 84 y.o. male with a hx listed below, significant for paroxysmal atrial fibrillation and tachycardia mediated cardiomyopathy, mitral valve regurgitation, CKD stage III, hypothyroidism, hypertension, sleep apnea referred for arrhythmia management.  He underwent atrial fibrillation ablation in June 2014 by Dr. Kelsie. He has had about 9 cardioversions over the years. The prior cardioversion was in January, 2023.  He has been maintained on amiodarone  but return the office in April 2024 with recurrence of atrial fibrillation, rate controlled he underwent DC cardioversion on April 29. It took him weeks to recover from this episode of AF and cardioversion. He had fluid retention, increased weakness.  He underwent routine screening studies for amiodarone  management and was found to have severely reduced DLCO (47%) on pulmonary function tests.  A-fib recurred in August 2024.  He is extremely symptomatic and unable to function normally.  He has fatigue and pretty much just sits in the chair and sleeps.  He underwent attempted DC cardioversion in October 2024 but had recurrence of atrial fibrillation.  He underwent ablation of atrial fibrillation with ablation of the posterior wall and pulmonary veins in February 2025.  Unfortunately, he had recurrence of atrial fibrillation by follow-up in March.  He continued to have atrial fibrillation despite amiodarone .  Amiodarone  was then discontinued and he was declared to be in permanent atrial fibrillation.  He was referred to general cardiology for rate control.  When seen by Dr. Shallow, his heart rates were greater than 120  bpm.  A monitor was placed that showed an average heart rate of 106 bpm.  His metoprolol  succinate was increased to 50 mg in the a.m. and 25 mg in the p.m.  Earlier this week, when he saw Caitlin West, his metoprolol  succinate was discontinued, and he was placed on metoprolol  to tartrate 50 mg twice daily.  Patient notes that he subsequently developed a pink rash around his waistband area.  He reports he has not had significant improvement in palpitations, shortness of breath, fatigue.  He has had increased abdominal swelling which he has managed with furosemide .    EKGs/Labs/Other Studies Reviewed Today:    Echocardiogram:  TTE 03/16/2022 EF 55 to 60%, grade 2 diastolic dysfunction, severely dilated left atrium  TTE 11/30/2022 EF 55-60%, severely dilated LA   Monitors:  3-day monitor December 2025 -- my interpretation High percent atrial fibrillation burden, heart rate 61 to 174 bpm, average 106 bpm  Stress testing:   Advanced imaging:   EKG:   EKG Interpretation Date/Time:  Wednesday July 23 2024 13:47:16 EST Ventricular Rate:  116 PR Interval:    QRS Duration:  86 QT Interval:  334 QTC Calculation: 464 R Axis:   63  Text Interpretation: Atrial fibrillation with rapid ventricular response Nonspecific ST and T wave abnormality When compared with ECG of 23-Jul-2024 13:46, No significant change was found Confirmed by Furbish Eulas [phone]) on 07/23/2024 1:59:49 PM     Recent Labs: 07/17/2024: ALT 63; BUN 65; Creatinine, Ser 2.35; Hemoglobin 12.0; Magnesium 2.7; Platelets 204; Potassium 4.2; Sodium 145; TSH 2.070   PFTs 2018 -- DLCO 50% PFTs 2024 -- DLCO 47%  Physical Exam:    VS:  BP 114/60 (BP Location: Right Arm, Patient Position: Sitting, Cuff Size: Normal)   Pulse (!) 116   Ht 5' 9.5 (1.765 m)   Wt 169 lb (76.7 kg)   SpO2 99%   BMI 24.60 kg/m     Wt Readings from Last 3 Encounters:  07/23/24 169 lb (76.7 kg)  07/17/24 171 lb 3.2 oz (77.7 kg)  05/20/24 168  lb (76.2 kg)     GEN: Well nourished, well developed in no acute distress CARDIAC: RRR, no murmurs, rubs, gallops RESPIRATORY:  Normal work of breathing MUSCULOSKELETAL: no edema    ASSESSMENT & PLAN:    Permanent atrial fibrillation Status post ablation 2014, 2025 multiple cardioversions  Has had recurrence of A-fib on amiodarone   Severely dilated LA in September 2023 (4.6 cm) AF has recurred soon after ablation with PFA Rates were controlled per RACE II guidelines on recent monitor (AVG HR < 110 bpm)  metoprolol  was increased, then increased again and switched to tartrate Will switch back to metoprolol  succinate at current dose  Severe pulmonary hypertension May need to consider as possible contributor to edema. Pt has follow-up in Cardiology clinic  Mitral valve regurgitation Moderate on most recent echo, 03/15/2022  CKD stage IIIb Most recent Cr 2.99 - limits AAD options  Obstructive sleep apnea Adherent to CPAP  Pt will continue to follow in general cardiology clinic for pulmonary hypertension, mitral valve disease, and rate control.         Medication Adjustments/Labs and Tests Ordered: Current medicines are reviewed at length with the patient today.  Concerns regarding medicines are outlined above.  Orders Placed This Encounter  Procedures   EKG 12-Lead   EKG 12-Lead   No orders of the defined types were placed in this encounter.    Signed, Eulas FORBES Furbish, MD  07/23/2024 2:03 PM    [facility] "

## 2024-08-22 ENCOUNTER — Ambulatory Visit (HOSPITAL_COMMUNITY)

## 2024-11-14 ENCOUNTER — Ambulatory Visit: Admitting: Cardiology
